# Patient Record
Sex: Male | Born: 1945
Health system: Southern US, Community
[De-identification: ages and names within clinical notes are randomized; demographics above are authoritative.]

## PROBLEM LIST (undated history)

## (undated) DIAGNOSIS — I10 Essential (primary) hypertension: Secondary | ICD-10-CM

## (undated) DIAGNOSIS — F419 Anxiety disorder, unspecified: Secondary | ICD-10-CM

## (undated) DIAGNOSIS — I219 Acute myocardial infarction, unspecified: Secondary | ICD-10-CM

## (undated) DIAGNOSIS — I499 Cardiac arrhythmia, unspecified: Secondary | ICD-10-CM

## (undated) DIAGNOSIS — I2510 Atherosclerotic heart disease of native coronary artery without angina pectoris: Secondary | ICD-10-CM

## (undated) DIAGNOSIS — I484 Atypical atrial flutter: Secondary | ICD-10-CM

## (undated) DIAGNOSIS — F329 Major depressive disorder, single episode, unspecified: Secondary | ICD-10-CM

## (undated) DIAGNOSIS — D693 Immune thrombocytopenic purpura: Secondary | ICD-10-CM

## (undated) DIAGNOSIS — R112 Nausea with vomiting, unspecified: Secondary | ICD-10-CM

## (undated) DIAGNOSIS — E782 Mixed hyperlipidemia: Secondary | ICD-10-CM

## (undated) DIAGNOSIS — F32A Depression, unspecified: Secondary | ICD-10-CM

## (undated) DIAGNOSIS — Z9889 Other specified postprocedural states: Secondary | ICD-10-CM

## (undated) DIAGNOSIS — M1990 Unspecified osteoarthritis, unspecified site: Secondary | ICD-10-CM

## (undated) DIAGNOSIS — I739 Peripheral vascular disease, unspecified: Secondary | ICD-10-CM

## (undated) DIAGNOSIS — T8859XA Other complications of anesthesia, initial encounter: Secondary | ICD-10-CM

## (undated) DIAGNOSIS — K219 Gastro-esophageal reflux disease without esophagitis: Secondary | ICD-10-CM

## (undated) DIAGNOSIS — N289 Disorder of kidney and ureter, unspecified: Secondary | ICD-10-CM

## (undated) DIAGNOSIS — I779 Disorder of arteries and arterioles, unspecified: Secondary | ICD-10-CM

## (undated) DIAGNOSIS — H9190 Unspecified hearing loss, unspecified ear: Secondary | ICD-10-CM

## (undated) DIAGNOSIS — Z86010 Personal history of colonic polyps: Principal | ICD-10-CM

## (undated) DIAGNOSIS — Z87442 Personal history of urinary calculi: Secondary | ICD-10-CM

## (undated) DIAGNOSIS — N189 Chronic kidney disease, unspecified: Secondary | ICD-10-CM

## (undated) DIAGNOSIS — H269 Unspecified cataract: Secondary | ICD-10-CM

## (undated) HISTORY — DX: Mixed hyperlipidemia: E78.2

## (undated) HISTORY — DX: Immune thrombocytopenic purpura: D69.3

## (undated) HISTORY — PX: COLONOSCOPY: SHX174

## (undated) HISTORY — DX: Atherosclerotic heart disease of native coronary artery without angina pectoris: I25.10

## (undated) HISTORY — DX: Atypical atrial flutter: I48.4

## (undated) HISTORY — DX: Essential (primary) hypertension: I10

## (undated) HISTORY — PX: FRACTURE SURGERY: SHX138

## (undated) HISTORY — DX: Unspecified cataract: H26.9

## (undated) HISTORY — DX: Personal history of colonic polyps: Z86.010

## (undated) HISTORY — DX: Disorder of kidney and ureter, unspecified: N28.9

## (undated) HISTORY — PX: HERNIA REPAIR: SHX51

## (undated) HISTORY — PX: EYE SURGERY: SHX253

## (undated) HISTORY — PX: JOINT REPLACEMENT: SHX530

## (undated) HISTORY — PX: TONSILLECTOMY: SUR1361

## (undated) HISTORY — DX: Chronic kidney disease, unspecified: N18.9

---
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
## 2001-05-03 ENCOUNTER — Ambulatory Visit (HOSPITAL_COMMUNITY): Admission: RE | Admit: 2001-05-03 | Discharge: 2001-05-03 | Payer: Self-pay | Admitting: Pulmonary Disease

## 2002-06-28 ENCOUNTER — Ambulatory Visit (HOSPITAL_COMMUNITY): Admission: RE | Admit: 2002-06-28 | Discharge: 2002-06-28 | Payer: Self-pay | Admitting: Pulmonary Disease

## 2002-07-30 ENCOUNTER — Ambulatory Visit (HOSPITAL_BASED_OUTPATIENT_CLINIC_OR_DEPARTMENT_OTHER): Admission: RE | Admit: 2002-07-30 | Discharge: 2002-07-30 | Payer: Self-pay | Admitting: Otolaryngology

## 2002-07-30 ENCOUNTER — Encounter (INDEPENDENT_AMBULATORY_CARE_PROVIDER_SITE_OTHER): Payer: Self-pay | Admitting: *Deleted

## 2004-05-27 ENCOUNTER — Ambulatory Visit (HOSPITAL_COMMUNITY): Admission: RE | Admit: 2004-05-27 | Discharge: 2004-05-27 | Payer: Self-pay | Admitting: Pulmonary Disease

## 2004-06-16 ENCOUNTER — Inpatient Hospital Stay (HOSPITAL_BASED_OUTPATIENT_CLINIC_OR_DEPARTMENT_OTHER): Admission: RE | Admit: 2004-06-16 | Discharge: 2004-06-16 | Payer: Self-pay | Admitting: Cardiology

## 2005-06-04 ENCOUNTER — Emergency Department (HOSPITAL_COMMUNITY): Admission: EM | Admit: 2005-06-04 | Discharge: 2005-06-04 | Payer: Self-pay | Admitting: Emergency Medicine

## 2005-07-09 ENCOUNTER — Ambulatory Visit (HOSPITAL_COMMUNITY): Admission: RE | Admit: 2005-07-09 | Discharge: 2005-07-09 | Payer: Self-pay | Admitting: Pulmonary Disease

## 2006-04-21 ENCOUNTER — Ambulatory Visit (HOSPITAL_COMMUNITY): Payer: Self-pay | Admitting: Psychiatry

## 2006-04-25 ENCOUNTER — Ambulatory Visit (HOSPITAL_COMMUNITY): Payer: Self-pay | Admitting: Psychiatry

## 2006-05-04 ENCOUNTER — Ambulatory Visit (HOSPITAL_COMMUNITY): Payer: Self-pay | Admitting: Psychiatry

## 2006-07-01 ENCOUNTER — Ambulatory Visit: Payer: Self-pay | Admitting: Internal Medicine

## 2006-07-01 ENCOUNTER — Inpatient Hospital Stay (HOSPITAL_COMMUNITY): Admission: EM | Admit: 2006-07-01 | Discharge: 2006-07-06 | Payer: Self-pay | Admitting: Emergency Medicine

## 2006-07-03 ENCOUNTER — Ambulatory Visit: Payer: Self-pay | Admitting: Oncology

## 2006-07-06 ENCOUNTER — Ambulatory Visit: Payer: Self-pay | Admitting: Oncology

## 2006-07-08 ENCOUNTER — Ambulatory Visit: Payer: Self-pay | Admitting: Cardiology

## 2006-07-26 ENCOUNTER — Ambulatory Visit: Payer: Self-pay | Admitting: Cardiology

## 2006-07-28 ENCOUNTER — Encounter (HOSPITAL_COMMUNITY): Admission: RE | Admit: 2006-07-28 | Discharge: 2006-08-27 | Payer: Self-pay | Admitting: Cardiology

## 2006-08-05 LAB — CBC WITH DIFFERENTIAL/PLATELET
BASO%: 0.8 % (ref 0.0–2.0)
Basophils Absolute: 0 10*3/uL (ref 0.0–0.1)
EOS%: 4.2 % (ref 0.0–7.0)
Eosinophils Absolute: 0.2 10*3/uL (ref 0.0–0.5)
HCT: 37.7 % — ABNORMAL LOW (ref 38.7–49.9)
HGB: 12.9 g/dL — ABNORMAL LOW (ref 13.0–17.1)
LYMPH%: 27 % (ref 14.0–48.0)
MCH: 27.8 pg — ABNORMAL LOW (ref 28.0–33.4)
MCHC: 34.1 g/dL (ref 32.0–35.9)
MCV: 81.7 fL (ref 81.6–98.0)
MONO#: 0.4 10*3/uL (ref 0.1–0.9)
MONO%: 10.4 % (ref 0.0–13.0)
NEUT#: 2.4 10*3/uL (ref 1.5–6.5)
NEUT%: 57.6 % (ref 40.0–75.0)
Platelets: 124 10*3/uL — ABNORMAL LOW (ref 145–400)
RBC: 4.61 10*6/uL (ref 4.20–5.71)
RDW: 14.4 % (ref 11.2–14.6)
WBC: 4.2 10*3/uL (ref 4.0–10.0)
lymph#: 1.1 10*3/uL (ref 0.9–3.3)

## 2006-08-05 LAB — MORPHOLOGY
PLT EST: DECREASED
RBC Comments: NORMAL

## 2006-08-05 LAB — CHCC SMEAR

## 2006-08-08 LAB — COMPREHENSIVE METABOLIC PANEL
ALT: 24 U/L (ref 0–40)
AST: 20 U/L (ref 0–37)
Albumin: 3.9 g/dL (ref 3.5–5.2)
Alkaline Phosphatase: 75 U/L (ref 39–117)
BUN: 20 mg/dL (ref 6–23)
CO2: 20 mEq/L (ref 19–32)
Calcium: 8.8 mg/dL (ref 8.4–10.5)
Chloride: 111 mEq/L (ref 96–112)
Creatinine, Ser: 1.67 mg/dL — ABNORMAL HIGH (ref 0.40–1.50)
Glucose, Bld: 90 mg/dL (ref 70–99)
Potassium: 3.8 mEq/L (ref 3.5–5.3)
Sodium: 143 mEq/L (ref 135–145)
Total Bilirubin: 0.4 mg/dL (ref 0.3–1.2)
Total Protein: 6.3 g/dL (ref 6.0–8.3)

## 2006-08-08 LAB — SEDIMENTATION RATE: Sed Rate: 13 mm/hr (ref 0–16)

## 2006-08-08 LAB — ANTI-NUCLEAR AB-TITER (ANA TITER): ANA Titer 1: 1:80 {titer} — ABNORMAL HIGH

## 2006-08-08 LAB — LACTATE DEHYDROGENASE: LDH: 149 U/L (ref 94–250)

## 2006-08-08 LAB — APTT: aPTT: 35 seconds (ref 24–37)

## 2006-08-08 LAB — ANA: Anti Nuclear Antibody(ANA): POSITIVE — AB

## 2006-08-29 ENCOUNTER — Encounter (HOSPITAL_COMMUNITY): Admission: RE | Admit: 2006-08-29 | Discharge: 2006-09-10 | Payer: Self-pay | Admitting: Cardiology

## 2006-09-08 ENCOUNTER — Emergency Department (HOSPITAL_COMMUNITY): Admission: EM | Admit: 2006-09-08 | Discharge: 2006-09-08 | Payer: Self-pay | Admitting: Emergency Medicine

## 2006-09-12 ENCOUNTER — Encounter (HOSPITAL_COMMUNITY): Admission: RE | Admit: 2006-09-12 | Discharge: 2006-10-12 | Payer: Self-pay | Admitting: Cardiology

## 2006-09-15 ENCOUNTER — Encounter: Admission: RE | Admit: 2006-09-15 | Discharge: 2006-09-15 | Payer: Self-pay | Admitting: Orthopaedic Surgery

## 2006-10-14 ENCOUNTER — Encounter (HOSPITAL_COMMUNITY): Admission: RE | Admit: 2006-10-14 | Discharge: 2006-11-13 | Payer: Self-pay | Admitting: Cardiology

## 2006-11-14 ENCOUNTER — Encounter (HOSPITAL_COMMUNITY): Admission: RE | Admit: 2006-11-14 | Discharge: 2006-12-12 | Payer: Self-pay | Admitting: Cardiology

## 2007-02-01 ENCOUNTER — Ambulatory Visit: Payer: Self-pay | Admitting: Oncology

## 2007-03-08 ENCOUNTER — Ambulatory Visit: Payer: Self-pay | Admitting: Cardiology

## 2007-03-10 ENCOUNTER — Ambulatory Visit: Payer: Self-pay | Admitting: Cardiology

## 2007-03-17 ENCOUNTER — Ambulatory Visit: Payer: Self-pay | Admitting: Cardiology

## 2007-03-23 ENCOUNTER — Ambulatory Visit: Payer: Self-pay | Admitting: Cardiology

## 2007-05-10 ENCOUNTER — Ambulatory Visit: Payer: Self-pay

## 2008-01-23 ENCOUNTER — Ambulatory Visit: Payer: Self-pay | Admitting: Cardiology

## 2008-04-01 ENCOUNTER — Ambulatory Visit (HOSPITAL_COMMUNITY): Admission: RE | Admit: 2008-04-01 | Discharge: 2008-04-01 | Payer: Self-pay | Admitting: Pulmonary Disease

## 2008-12-13 HISTORY — PX: CARDIAC CATHETERIZATION: SHX172

## 2009-03-17 ENCOUNTER — Encounter: Payer: Self-pay | Admitting: Cardiology

## 2009-03-25 ENCOUNTER — Ambulatory Visit: Payer: Self-pay | Admitting: Cardiology

## 2009-04-25 ENCOUNTER — Ambulatory Visit: Payer: Self-pay | Admitting: Cardiology

## 2009-04-28 ENCOUNTER — Encounter: Payer: Self-pay | Admitting: Cardiology

## 2009-05-29 ENCOUNTER — Telehealth: Payer: Self-pay | Admitting: Cardiology

## 2009-06-04 ENCOUNTER — Ambulatory Visit: Payer: Self-pay | Admitting: Cardiology

## 2009-06-12 ENCOUNTER — Observation Stay (HOSPITAL_COMMUNITY): Admission: AD | Admit: 2009-06-12 | Discharge: 2009-06-13 | Payer: Self-pay | Admitting: Cardiology

## 2009-06-12 ENCOUNTER — Ambulatory Visit: Payer: Self-pay | Admitting: Cardiovascular Disease

## 2009-06-13 ENCOUNTER — Encounter: Payer: Self-pay | Admitting: Cardiology

## 2009-06-17 ENCOUNTER — Encounter: Payer: Self-pay | Admitting: Cardiology

## 2009-07-11 ENCOUNTER — Ambulatory Visit: Payer: Self-pay | Admitting: Cardiology

## 2009-07-14 ENCOUNTER — Telehealth (INDEPENDENT_AMBULATORY_CARE_PROVIDER_SITE_OTHER): Payer: Self-pay | Admitting: *Deleted

## 2009-07-23 ENCOUNTER — Telehealth: Payer: Self-pay | Admitting: Cardiology

## 2009-09-03 DIAGNOSIS — I2510 Atherosclerotic heart disease of native coronary artery without angina pectoris: Secondary | ICD-10-CM | POA: Insufficient documentation

## 2009-09-03 DIAGNOSIS — R609 Edema, unspecified: Secondary | ICD-10-CM | POA: Insufficient documentation

## 2009-09-03 DIAGNOSIS — E782 Mixed hyperlipidemia: Secondary | ICD-10-CM | POA: Insufficient documentation

## 2009-12-29 ENCOUNTER — Ambulatory Visit: Payer: Self-pay | Admitting: Cardiology

## 2009-12-29 DIAGNOSIS — F341 Dysthymic disorder: Secondary | ICD-10-CM | POA: Insufficient documentation

## 2010-06-03 ENCOUNTER — Telehealth (INDEPENDENT_AMBULATORY_CARE_PROVIDER_SITE_OTHER): Payer: Self-pay | Admitting: *Deleted

## 2010-09-02 ENCOUNTER — Telehealth (INDEPENDENT_AMBULATORY_CARE_PROVIDER_SITE_OTHER): Payer: Self-pay | Admitting: *Deleted

## 2010-10-12 ENCOUNTER — Encounter: Payer: Self-pay | Admitting: Cardiology

## 2010-12-22 ENCOUNTER — Ambulatory Visit
Admission: RE | Admit: 2010-12-22 | Discharge: 2010-12-22 | Payer: Self-pay | Source: Home / Self Care | Attending: Cardiology | Admitting: Cardiology

## 2010-12-22 ENCOUNTER — Encounter: Payer: Self-pay | Admitting: Cardiology

## 2011-01-03 ENCOUNTER — Encounter: Payer: Self-pay | Admitting: Pulmonary Disease

## 2011-01-12 NOTE — Progress Notes (Signed)
Summary: labs  Phone Note Call from Patient Call back at Home Phone [phone]   Caller: Patient Reason for Call: Refill Medication, Talk to Nurse Summary of Call: wants to do lab work now at [facility] center and needs refill on cholr med Initial call taken by: Cher Nakai,  September 02, 2010 2:03 PM  Follow-up for Phone Call        Advised pt to have labs ordered by Dr. Anell Barr so he can manage cholesterol.  Patient verbalized understanding.  Follow-up by: Lovina Reach, LPN,  September 22, 624THL 2:43 PM

## 2011-01-12 NOTE — Progress Notes (Signed)
Summary: REQUEST FOR PRAVASTATIN REFILL  Phone Note Call from Patient Call back at [phone]   Caller: Spouse Call For: nurse Summary of Call: wife left message stating patient need refill on pravastatin sent to [facility]. Nurse informed wife via voicemail that one refill will be sent to pharmacy,but we need lipid lab results which is ordered by Dr. Luan Pulling inorder to refill future orders.   Initial call taken by: Georgina Peer,  June 03, 2010 3:15 PM    Prescriptions: PRAVASTATIN SODIUM 80 MG TABS (PRAVASTATIN SODIUM) Take one tablet by mouth daily at bedtime  #90 x 0   Entered by:   Georgina Peer   Authorized by:   Terald Sleeper, MD, [facility]   Signed by:   Georgina Peer on 06/03/2010   Method used:   Faxed to ...       [facility] [place])       [address] [place]  [address]       Ph: [phone]       Fax: [phone]   RxID:   [id number]

## 2011-01-12 NOTE — Assessment & Plan Note (Signed)
Summary: 6 MO FU PER JAN REMINDER-SRS   Visit Type:  Follow-up Primary Provider:  Dr. Velvet Bathe  CC:  follow-up visit.  History of Present Illness: the patient is a 65 year old male with a history of coronary artery disease but with nonobstructive disease by catheterization. The patient did have an abnormal Cardiolite study the apical lateral defect in May of 2010. However, he declines any substernal chest pain, shortness of breath orthopnea or PND. The patient denies any claudication. He has chronic renal insufficiency which is followed by Dr. Luan Pulling. From a cardiovascular perspective the patient is stable. He does report symptoms of right eye swelling. He denies however any dryness, itching decrease in right eye vision or runny eye.Marland Kitchen He has seen ophthalmologist and no significant pathology was identified.  Clinical Review Panels:  Cardiac Imaging Cardiac Cath Findings  HEMODYNAMICS:  Aortic pressure is 169/75 with a mean of 111, left   ventricular pressure 173/25.      ASSESSMENT:   1. Patent right coronary artery stent.   2. Mild nonobstructive coronary artery disease as outlined above.   3. Elevated left ventricular end-diastolic pressure.      PLAN:  Recommend continued medical therapy.  The patient will be   discharged home later today.      Juanda Bond. Burt Knack, MD (06/13/2009)    Preventive Screening-Counseling & Management  Alcohol-Tobacco     Smoking Status: quit > 6 months     Year Started: 1962     Year Quit: 1975  Current Medications (verified): 1)  Metoprolol Tartrate 25 Mg Tabs (Metoprolol Tartrate) .... Take 1 Tablet By Mouth Twice A Day 2)  Pravastatin Sodium 80 Mg Tabs (Pravastatin Sodium) .... Take One Tablet By Mouth Daily At Bedtime 3)  Nitroglycerin 0.4 Mg Subl (Nitroglycerin) .... Dissolve One Tablet Under Tongue For Severe Chest Pain As Needed Every 5 Minutes, Not To Exceed 3 in 15 Min Time Frame 4)  Calcium 500 Mg Tabs (Calcium Carbonate) .... Take 1  Tablet By Mouth Once A Day 5)  Diazepam 10 Mg Tabs (Diazepam) .... Take 1 Tablet By Mouth Two Times A Day 6)  Prilosec 20 Mg Cpdr (Omeprazole) .... Take 1 Tablet By Mouth Once A Day 7)  Fish Oil Maximum Strength 1200 Mg Caps (Omega-3 Fatty Acids) .... Take 1 Capsule By Mouth Two Times A Day 8)  Budeprion Sr 100 Mg Xr12h-Tab (Bupropion Hcl) .... Take 1 Tablet By Mouth Two Times A Day 9)  Allopurinol 300 Mg Tabs (Allopurinol) .... Take 1 Tablet By Mouth Once A Day 10)  Furosemide 80 Mg Tabs (Furosemide) .... Take 1 Tablet By Mouth Once A Day 11)  Multivitamins  Tabs (Multiple Vitamin) .... Take 1 Tablet By Mouth Once A Day 12)  Aspirin Ec 325 Mg Tbec (Aspirin) .... Take One Tablet By Mouth Daily 13)  Celebrex 200 Mg Caps (Celecoxib) .... Take 1 Tablet By Mouth Every Other Day 14)  Coenzyme Q10 10 Mg Caps (Coenzyme Q10) .... Take 1 Capsule By Mouth Once A Day 15)  Zyrtec Allergy 10 Mg Caps (Cetirizine Hcl) .... Take 1 Tablet By Mouth Once A Day  Allergies: No Known Drug Allergies  Comments:  Nurse/Medical Assistant: The patient's medications were reviewed with the patient and were updated in the Medication List. Pt brought a list of his medications.  Past History:  Past Medical History: Last updated: 09/03/2009 Chronic renal insufficiency Chronic thrombocytopenia History of claudication EDEMA (ICD-782.3) HYPERLIPIDEMIA-MIXED (ICD-272.4) CAD, NATIVE VESSEL (ICD-414.01)    Past  Surgical History: Last updated: 09/03/2009 Hernia repair Cardiac Catheterization Hip Arthroplasty-Total Tonsillectomy  Family History: Last updated: 09/03/2009 Family History of Coronary Artery Disease:  Family History of CVA or Stroke:  CHF  Social History: Last updated: 09/03/2009 Full Time Married  Tobacco Use - Former.  Alcohol Use - no Drug Use - no  Risk Factors: Smoking Status: quit > 6 months (12/29/2009)  Social History: Smoking Status:  quit > 6 months  Review of Systems        The patient complains of depression.  The patient denies fatigue, malaise, fever, weight gain/loss, vision loss, decreased hearing, hoarseness, chest pain, palpitations, shortness of breath, prolonged cough, wheezing, sleep apnea, coughing up blood, abdominal pain, blood in stool, nausea, vomiting, diarrhea, heartburn, incontinence, blood in urine, muscle weakness, joint pain, leg swelling, rash, skin lesions, headache, fainting, dizziness, anxiety, enlarged lymph nodes, easy bruising or bleeding, and environmental allergies.    Vital Signs:  Patient profile:   65 year old male Height:      71 inches Weight:      197 pounds BMI:     27.58 Pulse rate:   52 / minute BP sitting:   126 / 77  (left arm) Cuff size:   regular  Vitals Entered By: Gurney Maxin, RN, BSN (December 29, 2009 9:09 AM)  Nutrition Counseling: Patient's BMI is greater than 25 and therefore counseled on weight management options. CC: follow-up visit Comments Pt c/o cramps to his calves at night. States doesn't happen very often though   Physical Exam  Additional Exam:  General: Well-developed, well-nourished in no distress head: Normocephalic and atraumatic eyes PERRLA/EOMI intact, conjunctiva and lids normal nose: No deformity or lesions mouth normal dentition, normal posterior pharynx neck: Supple, no JVD.  No masses, thyromegaly or abnormal cervical nodes lungs: Normal breath sounds bilaterally without wheezing.  Normal percussion heart: regular rate and rhythm with normal S1 and S2, no S3 or S4.  PMI is normal.  No pathological murmurs abdomen: Normal bowel sounds, abdomen is soft and nontender without masses, organomegaly or hernias noted.  No hepatosplenomegaly musculoskeletal: Back normal, normal gait muscle strength and tone normal pulsus: Pulse is normal in all 4 extremities Extremities: No peripheral pitting edema neurologic: Alert and oriented x 3 skin: Intact without lesions or rashes cervical nodes: No  significant adenopathy psychologic: Normal affect    Impression & Recommendations:  Problem # 1:  CAD, NATIVE VESSEL (ICD-414.01) the patient reports no recurrent angina. He had an abnormal Cardiolite study within a lateral defect but is being treated medically. The patient is currently not on Plavix anymore. A stent to the right coronary artery is patent by catheterization in July of 2010. The patient will need to continue with risk factor modification and therapeutic lifestyle changes. The following medications were removed from the medication list:    Plavix 75 Mg Tabs (Clopidogrel bisulfate) .Marland Kitchen... Take 1 tablet by mouth once a day His updated medication list for this problem includes:    Metoprolol Tartrate 25 Mg Tabs (Metoprolol tartrate) .Marland Kitchen... Take 1 tablet by mouth twice a day    Nitroglycerin 0.4 Mg Subl (Nitroglycerin) .Marland Kitchen... Dissolve one tablet under tongue for severe chest pain as needed every 5 minutes, not to exceed 3 in 15 min time frame    Aspirin Ec 325 Mg Tbec (Aspirin) .Marland Kitchen... Take one tablet by mouth daily  Problem # 2:  HYPERLIPIDEMIA-MIXED (ICD-272.4) the patient is currently taking a statin. His laboratory work is followed by Dr. Luan Pulling.  His updated medication list for this problem includes:    Pravastatin Sodium 80 Mg Tabs (Pravastatin sodium) .Marland Kitchen... Take one tablet by mouth daily at bedtime  Problem # 3:  EDEMA (ICD-782.3) the patient reports unilateral swelling in the right eye. Otherwise he does not appear to be volume overloaded and there is no evidence that is cardiac related. Most likely was an allergic reaction and I recommended to the patient to take Zyrtec 10 mg p.o. q. daily.  Problem # 4:  ANXIETY DEPRESSION (ICD-300.4) the patient appears to be responding well to Wellbutrin and benzodiazepines. His problems followed by his primary care physician.  Patient Instructions: 1)  Zyrtec 10mg  daily - may buy generic over the counter. 2)  Follow up in 1 year.

## 2011-01-12 NOTE — Assessment & Plan Note (Signed)
Summary: RX - SIMVASTATIN  Prescriptions: PRAVASTATIN SODIUM 80 MG TABS (PRAVASTATIN SODIUM) Take one tablet by mouth daily at bedtime  #30 x 1   Entered by:   Lovina Reach, LPN   Authorized by:   Terald Sleeper, MD, [facility]   Signed by:   Lovina Reach, LPN on [id number]   Method used:   Electronically to        [facility]  [address] (retail)       [address]       [place], [place]  [address]       Ph: [phone]       Fax: [phone]   RxID:   [id number]

## 2011-01-14 NOTE — Assessment & Plan Note (Signed)
Summary: 1 YR FUL   Visit Type:  Follow-up Primary Provider:  Dr. Velvet Bathe   History of Present Illness: the patient is a 65 year old male with history of coronary artery disease. He had a stent placed to the right coronary artery. Catheterization in July 2010 showed a patent stent and mild otherwise nonobstructive coronary artery disease. He did have an elevated left ventricular end-diastolic pressure systolic function was within normal limits. The catheterization was done in July following an abnormal Cardiolite study in May of 2010 which showed a small apical lateral defect. The patient has been doing well. He is stable from a cardiovascular perspective. He presented originally with a myocardial infarction in 2007. He watches his diet. He denies any chest pain shortness of breath orthopnea PND he has no palpitations or syncope.  Preventive Screening-Counseling & Management  Alcohol-Tobacco     Smoking Status: quit     Year Quit: 2005  Current Medications (verified): 1)  Metoprolol Tartrate 25 Mg Tabs (Metoprolol Tartrate) .... Take 1 Tablet By Mouth Twice A Day 2)  Pravastatin Sodium 80 Mg Tabs (Pravastatin Sodium) .... Take One Tablet By Mouth Daily At Bedtime 3)  Nitroglycerin 0.4 Mg Subl (Nitroglycerin) .... Dissolve One Tablet Under Tongue For Severe Chest Pain As Needed Every 5 Minutes, Not To Exceed 3 in 15 Min Time Frame 4)  Calcium 500 Mg Tabs (Calcium Carbonate) .... Take 1 Tablet By Mouth Once A Day 5)  Diazepam 10 Mg Tabs (Diazepam) .... Take 1 Tablet By Mouth Two Times A Day 6)  Prilosec 20 Mg Cpdr (Omeprazole) .... Take 1 Tablet By Mouth Once A Day 7)  Fish Oil Maximum Strength 1200 Mg Caps (Omega-3 Fatty Acids) .... Take 1 Capsule By Mouth Two Times A Day 8)  Budeprion Sr 100 Mg Xr12h-Tab (Bupropion Hcl) .... Take 1 Tablet By Mouth Once A Day 9)  Allopurinol 300 Mg Tabs (Allopurinol) .... Take 1 Tablet By Mouth Once A Day 10)  Furosemide 80 Mg Tabs (Furosemide) .... Take  1 Tablet By Mouth Once A Day 11)  Multivitamins  Tabs (Multiple Vitamin) .... Take 1 Tablet By Mouth Once A Day 12)  Aspirin Ec 325 Mg Tbec (Aspirin) .... Take One Tablet By Mouth Daily 13)  Celebrex 200 Mg Caps (Celecoxib) .... Take 1 Tablet By Mouth Every Other Day  Allergies (verified): No Known Drug Allergies  Comments:  Nurse/Medical Assistant: The patient's medication list and allergies were reviewed with the patient and were updated in the Medication and Allergy Lists.  Past History:  Past Surgical History: Last updated: 09/03/2009 Hernia repair Cardiac Catheterization Hip Arthroplasty-Total Tonsillectomy  Family History: Last updated: 09/03/2009 Family History of Coronary Artery Disease:  Family History of CVA or Stroke:  CHF  Social History: Last updated: 09/03/2009 Full Time Married  Tobacco Use - Former.  Alcohol Use - no Drug Use - no  Risk Factors: Smoking Status: quit (12/22/2010)  Past Medical History: Chronic renal insufficiency Chronic thrombocytopenia History of claudication EDEMA (ICD-782.3) HYPERLIPIDEMIA-MIXED (ICD-272.4) CAD, NATIVE VESSEL (ICD-414.01) Stent to the RCA 2007 Cardiac catheterization 2010 patent stent and nonobstructive coronary artery disease following an abnormal Cardiolite study May of 2010 with an apical lateral defect    Social History: Smoking Status:  quit  Review of Systems  The patient denies fatigue, malaise, fever, weight gain/loss, vision loss, decreased hearing, hoarseness, chest pain, palpitations, shortness of breath, prolonged cough, wheezing, sleep apnea, coughing up blood, abdominal pain, blood in stool, nausea, vomiting, diarrhea, heartburn, incontinence, blood  in urine, muscle weakness, joint pain, leg swelling, rash, skin lesions, headache, fainting, dizziness, depression, anxiety, enlarged lymph nodes, easy bruising or bleeding, and environmental allergies.    Vital Signs:  Patient profile:   65 year  old male Height:      71 inches Weight:      195 pounds BMI:     27.30 Pulse rate:   51 / minute BP sitting:   125 / 81  (left arm) Cuff size:   large  Vitals Entered By: Georgina Peer (December 22, 2010 8:41 AM)  Nutrition Counseling: Patient's BMI is greater than 25 and therefore counseled on weight management options.  Physical Exam  Additional Exam:  General: Well-developed, well-nourished in no distress head: Normocephalic and atraumatic eyes PERRLA/EOMI intact, conjunctiva and lids normal nose: No deformity or lesions mouth normal dentition, normal posterior pharynx neck: Supple, no JVD.  No masses, thyromegaly or abnormal cervical nodes lungs: Normal breath sounds bilaterally without wheezing.  Normal percussion heart: regular rate and rhythm with normal S1 and S2, no S3 or S4.  PMI is normal.  No pathological murmurs abdomen: Normal bowel sounds, abdomen is soft and nontender without masses, organomegaly or hernias noted.  No hepatosplenomegaly musculoskeletal: Back normal, normal gait muscle strength and tone normal pulsus: Pulse is normal in all 4 extremities Extremities: No peripheral pitting edema neurologic: Alert and oriented x 3 skin: Intact without lesions or rashes cervical nodes: No significant adenopathy psychologic: Normal affect    EKG  Procedure date:  12/22/2010  Findings:      normal sinus rhythm baseline wander. Heart rate 53 beats per minute nonspecific ST-T wave changes.  Impression & Recommendations:  Problem # 1:  CAD, NATIVE VESSEL (ICD-414.01) the patient status post stent placement to the RCA. He has no recurrent chest pain. He has not required any nitroglycerin. We will continue risk factor modification. His updated medication list for this problem includes:    Metoprolol Tartrate 25 Mg Tabs (Metoprolol tartrate) .Marland Kitchen... Take 1 tablet by mouth twice a day    Nitroglycerin 0.4 Mg Subl (Nitroglycerin) .Marland Kitchen... Dissolve one tablet under tongue  for severe chest pain as needed every 5 minutes, not to exceed 3 in 15 min time frame    Aspirin Ec 325 Mg Tbec (Aspirin) .Marland Kitchen... Take one tablet by mouth daily  Orders: EKG w/ Interpretation (93000)  Problem # 2:  HYPERLIPIDEMIA-MIXED (ICD-272.4) followed by the patient's primary care physician. He is on high-dose statin drug therapy His updated medication list for this problem includes:    Pravastatin Sodium 80 Mg Tabs (Pravastatin sodium) .Marland Kitchen... Take one tablet by mouth daily at bedtime  Problem # 3:  ANXIETY DEPRESSION (ICD-300.4) stable.  Patient Instructions: 1)  Your physician recommends that you continue on your current medications as directed. Please refer to the Current Medication list given to you today. 2)  Follow up in  6 mnths

## 2011-03-04 ENCOUNTER — Emergency Department (HOSPITAL_COMMUNITY): Payer: No Typology Code available for payment source

## 2011-03-04 ENCOUNTER — Emergency Department (HOSPITAL_COMMUNITY)
Admission: EM | Admit: 2011-03-04 | Discharge: 2011-03-04 | Disposition: A | Discharge status: Home / Self Care | Payer: No Typology Code available for payment source | Attending: Emergency Medicine | Admitting: Emergency Medicine

## 2011-03-04 DIAGNOSIS — I2510 Atherosclerotic heart disease of native coronary artery without angina pectoris: Secondary | ICD-10-CM | POA: Insufficient documentation

## 2011-03-04 DIAGNOSIS — Z79899 Other long term (current) drug therapy: Secondary | ICD-10-CM | POA: Insufficient documentation

## 2011-03-04 DIAGNOSIS — IMO0002 Reserved for concepts with insufficient information to code with codable children: Secondary | ICD-10-CM | POA: Insufficient documentation

## 2011-03-04 DIAGNOSIS — Y92410 Unspecified street and highway as the place of occurrence of the external cause: Secondary | ICD-10-CM | POA: Insufficient documentation

## 2011-03-04 DIAGNOSIS — I10 Essential (primary) hypertension: Secondary | ICD-10-CM | POA: Insufficient documentation

## 2011-03-21 LAB — COMPREHENSIVE METABOLIC PANEL
ALT: 31 U/L (ref 0–53)
AST: 27 U/L (ref 0–37)
Albumin: 3.4 g/dL — ABNORMAL LOW (ref 3.5–5.2)
Alkaline Phosphatase: 79 U/L (ref 39–117)
BUN: 22 mg/dL (ref 6–23)
CO2: 24 mEq/L (ref 19–32)
Calcium: 8.6 mg/dL (ref 8.4–10.5)
Chloride: 105 mEq/L (ref 96–112)
Creatinine, Ser: 2.03 mg/dL — ABNORMAL HIGH (ref 0.4–1.5)
GFR calc Af Amer: 40 mL/min — ABNORMAL LOW (ref >60)
GFR calc non Af Amer: 33 mL/min — ABNORMAL LOW (ref >60)
Glucose, Bld: 116 mg/dL — ABNORMAL HIGH (ref 70–99)
Potassium: 3.5 mEq/L (ref 3.5–5.1)
Sodium: 140 mEq/L (ref 135–145)
Total Bilirubin: 0.5 mg/dL (ref 0.3–1.2)
Total Protein: 6.5 g/dL (ref 6.0–8.3)

## 2011-03-21 LAB — CBC
HCT: 38.4 % — ABNORMAL LOW (ref 39.0–52.0)
HCT: 40.3 % (ref 39.0–52.0)
Hemoglobin: 12.7 g/dL — ABNORMAL LOW (ref 13.0–17.0)
Hemoglobin: 13.8 g/dL (ref 13.0–17.0)
MCHC: 33 g/dL (ref 30.0–36.0)
MCHC: 34.3 g/dL (ref 30.0–36.0)
MCV: 84.7 fL (ref 78.0–100.0)
MCV: 84.9 fL (ref 78.0–100.0)
Platelets: 100 10*3/uL — ABNORMAL LOW (ref 150–400)
Platelets: 78 10*3/uL — ABNORMAL LOW (ref 150–400)
RBC: 4.53 MIL/uL (ref 4.22–5.81)
RBC: 4.76 MIL/uL (ref 4.22–5.81)
RDW: 15.6 % — ABNORMAL HIGH (ref 11.5–15.5)
RDW: 15.6 % — ABNORMAL HIGH (ref 11.5–15.5)
WBC: 4.6 10*3/uL (ref 4.0–10.5)
WBC: 5.3 10*3/uL (ref 4.0–10.5)

## 2011-03-21 LAB — BASIC METABOLIC PANEL
BUN: 22 mg/dL (ref 6–23)
CO2: 27 mEq/L (ref 19–32)
Calcium: 8.3 mg/dL — ABNORMAL LOW (ref 8.4–10.5)
Chloride: 108 mEq/L (ref 96–112)
Creatinine, Ser: 1.97 mg/dL — ABNORMAL HIGH (ref 0.4–1.5)
GFR calc Af Amer: 42 mL/min — ABNORMAL LOW (ref >60)
GFR calc non Af Amer: 35 mL/min — ABNORMAL LOW (ref >60)
Glucose, Bld: 111 mg/dL — ABNORMAL HIGH (ref 70–99)
Potassium: 3.6 mEq/L (ref 3.5–5.1)
Sodium: 140 mEq/L (ref 135–145)

## 2011-03-21 LAB — PROTIME-INR
INR: 1.1 (ref 0.00–1.49)
Prothrombin Time: 14.3 seconds (ref 11.6–15.2)

## 2011-03-21 LAB — APTT: aPTT: 34 seconds (ref 24–37)

## 2011-03-21 LAB — TSH: TSH: 0.825 u[IU]/mL (ref 0.350–4.500)

## 2011-04-04 ENCOUNTER — Other Ambulatory Visit: Payer: Self-pay | Admitting: Cardiology

## 2011-04-15 ENCOUNTER — Other Ambulatory Visit (HOSPITAL_COMMUNITY): Payer: Self-pay | Admitting: Pulmonary Disease

## 2011-04-15 DIAGNOSIS — M5410 Radiculopathy, site unspecified: Secondary | ICD-10-CM

## 2011-04-15 DIAGNOSIS — R200 Anesthesia of skin: Secondary | ICD-10-CM

## 2011-04-20 ENCOUNTER — Ambulatory Visit (HOSPITAL_COMMUNITY)
Admission: RE | Admit: 2011-04-20 | Discharge: 2011-04-20 | Disposition: A | Discharge status: Home / Self Care | Payer: No Typology Code available for payment source | Source: Ambulatory Visit | Attending: Pulmonary Disease | Admitting: Pulmonary Disease

## 2011-04-20 DIAGNOSIS — M542 Cervicalgia: Secondary | ICD-10-CM | POA: Insufficient documentation

## 2011-04-20 DIAGNOSIS — M5030 Other cervical disc degeneration, unspecified cervical region: Secondary | ICD-10-CM | POA: Insufficient documentation

## 2011-04-20 DIAGNOSIS — R209 Unspecified disturbances of skin sensation: Secondary | ICD-10-CM | POA: Insufficient documentation

## 2011-04-20 DIAGNOSIS — R200 Anesthesia of skin: Secondary | ICD-10-CM

## 2011-04-20 DIAGNOSIS — M5410 Radiculopathy, site unspecified: Secondary | ICD-10-CM
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
## 2011-04-27 NOTE — Cardiovascular Report (Signed)
NAME:  Derek [name], HIRSH NO.:  [id number]   MEDICAL RECORD NO.:  [id number]          PATIENT TYPE:  INP   LOCATION:  3729                         FACILITY:  [facility]   PHYSICIAN:  Juanda Bond. Burt Knack, MD  DATE OF BIRTH:  Oct 09, 1946   DATE OF PROCEDURE:  06/13/2009  DATE OF DISCHARGE:  06/13/2009                            CARDIAC CATHETERIZATION   PROCEDURES:  Left heart catheterization and selective coronary  angiography.   INDICATIONS:  Mr. Siemion is a 65 year old gentleman with CAD.  He has  had previous stenting of the right coronary artery with a Taxus drug-  eluting stent in 2007.  He saw Dr. Dannielle Burn for accelerating angina and he  had an abnormal Myoview with apical and lateral ischemia.  He was  referred for cardiac cath.   Risks and indications of procedure were reviewed with the patient and an  informed consent was obtained.  The right groin was prepped, draped, and  anesthetized with 1% lidocaine.  Using modified Seldinger technique, a 5-  French sheath was placed in the right femoral artery stent and 5-French  Judkins catheters were used for coronary angiography.  A pigtail  catheter was used to measure LV pressure.  Ventriculogram was deferred  because of renal insufficiency.  The patient has a baseline creatinine  of 2 and it has been in this range for years.  The patient tolerated the  procedure well.  All catheter exchanges were performed over a guide  wire.  Total contrast volume of 30 mL was used for the procedure.   FINDINGS:  Left mainstem widely patent.  No significant stenosis  bifurcates into LAD and left circumflex.   LAD:  The LAD is a large-caliber vessel that courses down and reaches  the LV apex.  There is a large first diagonal branch and a small second  diagonal branch.  There is minimal nonobstructive plaque throughout the  proximal LAD.  There are no significant stenoses present.  There is no  diagonal stenosis present.   Left  circumflex:  Left circumflex is a moderate-sized vessel that  supplies 2 main obtuse marginal branches.  The mid-circ leading into a  small second OM has a 50% stenosis.  There are no high-grade stenoses  throughout.   Right coronary artery:  The right coronary artery shows a widely patent  stent at the junction of the mid to distal vessel.  Proximal to the  stent in the mid RCA, there is a 40% stenosis present.  This is a smooth  area.  There is some mild nonobstructive plaque in the distal vessel.  The vessel bifurcates into a PDA and 2 posterolateral branches.  There  are no high-grade stenoses throughout the course of the right coronary  artery or its branch vessels.   HEMODYNAMICS:  Aortic pressure is 169/75 with a mean of 111, left  ventricular pressure 173/25.   ASSESSMENT:  1. Patent right coronary artery stent.  2. Mild nonobstructive coronary artery disease as outlined above.  3. Elevated left ventricular end-diastolic pressure.   PLAN:  Recommend continued medical therapy.  The  patient will be  discharged home later today.      Juanda Bond. Burt Knack, MD  Electronically Signed     MDC/MEDQ  D:  06/13/2009  T:  06/13/2009  Job:  [id number]

## 2011-04-27 NOTE — Discharge Summary (Signed)
NAME:  Derek [name], Derek [name] NO.:  [id number]   MEDICAL RECORD NO.:  [id number]          PATIENT TYPE:  INP   LOCATION:  3729                         FACILITY:  [facility]   PHYSICIAN:  Juanda Bond. Burt Knack, MD  DATE OF BIRTH:  [birth year]   DATE OF ADMISSION:  06/12/2009  DATE OF DISCHARGE:  06/13/2009                               DISCHARGE SUMMARY   PRIMARY CARDIOLOGIST:  Ernestine Mcmurray, MD, [facility]   PRIMARY CARE Lemya Greenwell:  Jasper Loser. Luan Pulling, MD   DISCHARGE DIAGNOSIS:  Unstable angina.   SECONDARY DIAGNOSES:  1. Coronary artery disease, status post non-ST elevation myocardial      infarction with TAXUS drug-eluting stent placement to the right      coronary artery in July 2007.  2. Chronic asymptomatic bradycardia.  3. Hyperlipidemia.  4. History of low HDL.  5. Chronic renal insufficiency with solitary functioning kidney.  6. Chronic lower extremity edema.  7. Ulcerative lesions in the lower extremity secondary to prediabetes.  8. Chronic thrombocytopenia.  9. History of claudication with normal Dopplers.  10.Thrombocytopenia.   ALLERGIES:  No known drug allergies.   PROCEDURES:  Left heart cardiac catheterization revealing widely patent  right coronary artery stent, but otherwise nonobstructive disease.   HISTORY OF PRESENT ILLNESS:  This 65 year old Caucasian male with a  prior history of CAD, status post non-ST elevation MI and stenting of  the RCA in July 2007.  The patient underwent stress testing as part of a  preoperative evaluation recently revealing an EF of 48% with reversible  high lateral defect as well as an apical defect consistent with  ischemia.  The patient also reported increasing shortness of breath with  prolonged episodes of exertional chest discomfort.  He was subsequently  set up for admission for hydration given prior history of renal  insufficiency with plans for catheterization.   HOSPITAL COURSE:  The patient presented on June 12, 2009, for  hydration  and initiation of Mucomyst therapy.  He has tolerated this well and this  morning underwent left heart cardiac catheterization revealing widely  patent RCA stent, but otherwise nonobstructive disease.  The patient has  had no recurrent symptoms or limitations, but ambulating without  difficulty.  He will be discharged home today in good condition.   DISCHARGE LABORATORY DATA:  Hemoglobin 12.7, hematocrit 38.4, WBC 4.6,  and platelets 78.  Sodium 140, potassium 3.6, chloride 108, CO2 27, BUN  22, creatinine 1.97, glucose 111, total bilirubin 0.5, and alkaline  phosphatase 79.  AST 27, ALT 31, total protein 6.5, albumin 3.4, calcium  8.3, and TSH 0.825.   DISPOSITION:  The patient is being discharged home today in good  condition.   FOLLOWUP PLANS AND APPOINTMENTS:  We have arranged to follow up with Dr.  Dannielle Burn on July 11, 2009, at 2:15 p.m.  He is asked to follow up with Dr.  Luan Pulling as previously scheduled.   DISCHARGE MEDICATIONS:  1. Metoprolol 25 mg b.i.d.  2. Multivitamin daily.  3. Furosemide 80 mg daily.  4. Diazepam 10 mg b.i.d.  5. Pravastatin 40 mg daily.  6. Allopurinol 300 mg daily.  7. Aspirin 325 mg daily.  8. Fish oil 1200 mg daily.  9. Bupropion SR 100 mg daily.  10.Prilosec OTC 20 mg daily.  11.Celebrex 200 mg every other day.  12.Nitroglycerin 0.4 mg sublingual p.r.n. chest pain.   OUTSTANDING LABORATORY STUDIES:  None.   DURATION OF DISCHARGE/ENCOUNTER:  Forty-five minutes including physician  time.      Murray Hodgkins, ANP      Juanda Bond. Burt Knack, MD  Electronically Signed    CB/MEDQ  D:  06/13/2009  T:  06/13/2009  Job:  [id number]   cc:   Percell Miller L. Luan Pulling, M.D.

## 2011-04-27 NOTE — Assessment & Plan Note (Signed)
[facility] OFFICE NOTE   [name], Derek [name]                       MRN:          [id number]  DATE:03/25/2009                            DOB:          [birth year]    PRIMARY CARE PHYSICIAN:  Percell Miller L. Luan Pulling, MD, in [place].   HISTORY OF PRESENT ILLNESS:  The patient is a 65 year old male with a  history of coronary artery disease status post non-STE myocardial  infarction with Taxus stent to the right coronary artery in July 2007.  The patient also has residual 70% circumflex coronary artery disease.  The last stress test was done in 2008 with no evidence of ischemia.  The  patient presents for followup.  He reports small area in the left side  of chest which occasionally gives him a height of this usually in order  of seconds.  He states that this is not exertion related nor this is  associated with shortness of breath.  He is otherwise physically active.  He does complain of some fatigue.  He has no definite claudication,  although he does get some soreness in both lower extremities.  He has  also noted in his right lower extremity poor healing scabs that easily  bleed when he scratches them.  There is a mild amount of itching  associated with this.  The patient states that also he is planning to  undergo hip surgery later this summer.   MEDICATIONS:  1. Metoprolol 25 mg p.o. b.i.d.  2. Diazepam 10 mg p.o. b.i.d.  3. Pravastatin 40 mg p.o. daily.  4. Prilosec over-the-counter.  5. Fish oil.  6. Buspirone SR 10 mg p.o. b.i.d.  7. Allopurinol 300 mg p.o. daily.  8. Furosemide 80 mg p.o. daily.  9. Multivitamin.  10.Aspirin.  11.Celebrex 400 mg p.o. every other day.   PHYSICAL EXAMINATION:  VITAL SIGNS:  Blood pressure 129/78, heart rate  is 56, weight is 191 pounds.  GENERAL:  A 65 year old male in no apparent distress.  HEENT:  Pupils; eyes are clear.  Conjunctivae are clear.  NECK:  Supple.   Normal carotid upstroke, no carotid bruits.  No JVD.  No  thyromegaly.  LUNGS:  Clear breath sounds bilaterally.  HEART:  Regular rate rhythm.  Normal S1 and S2.  No murmurs, rubs, or  gallops.  ABDOMEN:  Soft, nontender.  No rebound or guarding with good bowel  sounds.  EXTREMITY:  Femoral pulses are difficult to palpate.  I can palpate the  left femoral pulse and there is a soft bruit on the right side.  Dorsalis pedis pulses, posterior tibial pulses are faintly palpable in  the lower extremities.  NEURO:  The patient is alert and oriented, grossly nonfocal.   LABORATORY WORK:  The patient had recently had a lipid panel done, which  showed an HDL of 23 and an LDL of 96, triglycerides were 150,  cholesterol was 149.  Liver functions were within normal limits.  Pravastatin was increased to 80 mg p.o. daily.   PROBLEMS:  1. Coronary artery disease with stable symptoms.  a.     Non-ST-elevation myocardial infarction/Taxus stenting to       right coronary artery in July 2007.      b.     Residual 70% circumflex disease, symptomatic.      c.     Normal left ventricular function.      d.     Nonischemic adenosine/Cardiolite study, ejection fraction       60% in April 2009.  2. Chronic asymptomatic sinus bradycardia.  3. Dyslipidemia, controlled on Pravachol, but with low HDL.  4. Chronic renal insufficiency with solitary functioning kidney.  5. Chronic lower extremity edema, resolved.  6. Ulcerative lesions in the lower extremities.  7. Chronic thrombocytopenia.  8. History of claudication but with normal Dopplers in April 2008.   PLAN:  1. The patient does have an abnormal peripheral vascular exam.  I am      also not exactly sure what the poor healing scabs are all about.  I      do not think he has an eruptive xanthomatosis.  His lipid panel is      within normal limits.  We will repeat his lower extremity Dopplers,      however, which augment the wave forms.  2. Given the  fact that the patient is planning to undergo hip surgery      later this summer, I do think it is indicated to proceed with      stress testing.  This can be a chemical stress test with adenosine      or Lexiscan given the difficulty the patient has with walking.  3. The patient can follow up with [place] in the next couple of months,      earlier if further discussion is needed about his test results.     Ernestine Mcmurray, MD,FACC  Electronically Signed    GED/MedQ  DD: 03/25/2009  DT: 03/26/2009  Job #: [id number]   cc:   Percell Miller L. Luan Pulling, M.D.

## 2011-04-27 NOTE — Assessment & Plan Note (Signed)
[facility] OFFICE NOTE   Derek, [name]                       MRN:          [id number]  DATE:01/23/2008                            DOB:          08-May-1946    PRIMARY CARDIOLOGIST:  Dr. Terald [name].   REASON FOR VISIT:  Scheduled clinic follow-up.   Derek [name] continues to do extremely well from Derek cardiovascular  standpoint, with no interim development of signs/symptoms suggestive of  unstable angina pectoris.   When last seen here in Derek clinic in March 2008, Derek [name] was referred for an  adenosine stress Cardiolite study for risk stratification.  This showed  no definite evidence of ischemia; ejection fraction 60% with mild  inferior defect suggestive of probable scar.   [name] is followed regularly by Dr. Erling [name] in [place] for his  chronic renal insufficiency with known, solitary functioning kidney.  Derek [name]  was also previously followed by Dr. Beryle [name], again in [place],  for thrombocytopenia.   With respect to his antiplatelet regimen, Derek [name] has remained on Plavix and  low dose aspirin since last seen in Derek clinic.  Derek [name] is now one and half  years out since undergoing percutaneous intervention with Derek drug-eluting  stent.  Derek [name] does report easy bruisability, which has plagued him for some  time now.   Derek [name] has not smoked tobacco in several years.   Electrocardiogram today reveals sinus bradycardia at 56 bpm with  nonspecific ST abnormalities.   CURRENT MEDICATIONS:  1. Aspirin 81 daily.  2. Plavix.  3. Pravastatin 40 mg daily.  4. Metoprolol 25 b.i.d.  5. Prilosec OTC.  6. Fish oil 1200 mg daily.  7. Allopurinol 300 daily.  8. Furosemide 80 daily.  9. Bupropion 100 mg b.i.d.  10.Diazepam 10 mg b.i.d.   LABORATORY DATA:  Recent lipid profile:  Total cholesterol 154,  triglycerides 237, HDL 33, and LDL 74.   PHYSICAL EXAMINATION:  VITAL SIGNS:  Blood pressure 129/72, pulse 56  regular, weight 196.  GENERAL:  Derek 65 year old [name] sitting upright in no distress.  HEENT:  Normocephalic, atraumatic.  NECK:  Palpable bilateral carotid pulses without bruits; no JVD.  LUNGS:  Clear to auscultation all fields.  HEART:  Regular rate and rhythm (S1, S2), no significant murmurs.  No  rubs.  ABDOMEN:  Soft, nontender with intact bowel sounds.  No bruits.  EXTREMITIES:  1/4 right femoral pulse with soft bruit; no bruit on Derek  left.  Palpable posterior tibialis pulses with  1+ nonpitting edema on Derek right.  Several excoriations of Derek right  lower extremity with some mild erythema.  No obvious oozing.  NEUROLOGICAL:  Flat affect but no focal deficit.   IMPRESSION:  1. Coronary artery disease.      Derek.     NSTEMI/Taxus stenting right coronary artery, July 2007.      b.     Residual 70% CFX disease.      c.     Normal left ventricular function.      d.     Nonischemic adenosine stress Cardiolite; EF  60%, April 2008.  2. Chronic, asymptomatic sinus bradycardia.  3. Mixed dyslipidemia.      Derek.     On Pravachol/fish oil, followed by Dr. Luan [name].  4. Chronic renal insufficiency.      Derek.     Solitary functioning kidney.  5. Chronic lower extremity edema.      Derek.     Secondary to venous insufficiency.  6. Chronic thrombocytopenia.  7. History of claudication.      Derek.     Normal lower extremity arterial Dopplers, April 2008.   PLAN:  1. Discontinue Plavix.  Derek [name] is now 1-1/2 years out from      undergoing drug-eluting stenting.  Moreover, Derek [name] has been plagued by      easy bruisability.  Derek [name] is to remain on low dose aspirin,      indefinitely.  2. Continue aggressive lipid management, per Dr. Luan [name], with target      LDL goal of 70, or less.  3. Schedule return clinic follow-up with myself and Dr. Dannielle [name] in one      year or sooner, if needed.      Derek Serpe, PA-C  Electronically Signed      Derek Mcmurray, MD,FACC  Electronically Signed   GS/MedQ  DD:  01/23/2008  DT: 01/24/2008  Job #: [id number]   cc:   Derek [name] Derek [name], M.D.  Derek [name]. Derek [name], M.D.

## 2011-04-27 NOTE — Assessment & Plan Note (Signed)
Derek [name] OFFICE NOTE   Derek [name], Derek [name]                       MRN:          [id number]  DATE:07/11/2009                            DOB:          Sep 10, 1946    REFERRING PHYSICIAN:  Percell Miller L. Luan Pulling, M.D.   HISTORY OF PRESENT ILLNESS:  The patient is a 65 year old male with a  history of coronary artery status post non-ST-elevation myocardial  infarction in July 2007.  The patient when last seen in the office had  substernal chest pain and the symptoms were worrisome for angina.  He  also had an abnormal stress test with lateral ischemia and therefore we  proceeded with a cardiac catheterization.  The patient did have renal  insufficiency with a creatinine of 1.9 and he was adequately  prehydrated.  The study was done as an inpatient.  The patient was found  to have no significant stenoses with a 40% in-stent restenosis of the  mid RCA and 50% stenosis of the circumflex coronary artery.  The patient  reports no recurrent substernal chest pain.  He has also been taken out  his hot work environment.   REVIEW OF SYSTEMS:  The patient denies any orthopnea, PND, palpitations,  or syncope.   MEDICATIONS:  1. Pravastatin 80 mg p.o. daily.  2. Calcium daily.  3. Metoprolol 25 mg p.o. b.i.d.  4. Diazepam 10 mg p.o. b.i.d.  5. Prilosec 20 mg p.o. daily.  6. Fish oil.  7. Buspirone SR 100 mg p.o. b.i.d.  8. Allopurinol 10 mg p.o. daily.  9. Furosemide 80 mg p.o. daily.  10.Multivitamin.  11.Aspirin.  12.Celebrex.   PHYSICAL EXAMINATION:  VITAL SIGNS:  Blood pressure 132/77, heart rate  61, weights 193 pounds.  GENERAL:  A well-nourished white male in no apparent distress.  HEENT:  Pupils, eyes clear; conjunctivae clear.  NECK:  Supple.  Normal carotid upstroke.  No carotid bruits.  LUNGS:  Clear breath sounds bilaterally.  HEART:  Regular rate and rhythm.  Normal S1 and S2.  No murmurs, rubs,  or  gallops.  ABDOMEN:  Soft.  EXTREMITIES:  No cyanosis, clubbing, or edema.   PROBLEM LIST:  1. Coronary artery disease.      a.     Status post cardiac catheterization with nonobstructive       disease with details as above.      b.     Abnormal adenosine thallium study with apical lateral defect       in May 2010.  2. Chronic asymptomatic sinus bradycardia.  3. Dyslipidemia, on Pravachol.  4. Low HDL.  5. Chronic renal insufficiency with solitary functioning kidney.  6. Chronic lower extremity edema, resolved.  7. Poor circulation in the lower extremities secondary to prediabetes.  8. Chronic thrombocytopenia, stable.  9. History of claudication with normal Doppler.   PLAN:  1. From a cardiovascular standpoint, the patient is stable.  However,      his thallium test did show a lateral ischemia and if he has      recurrent chest pain in the future,  would have a low threshold for      intervention on the circumflex coronary artery if this is the      culprit vessel.  2. The patient's creatinine is stable at 2.1 and can be continued to      be followed by his primary care physician, Dr. Luan Pulling.  3. At this point, the patient is stable from a cardiovascular      standpoint.  I have made no other changes in his medical regimen.     Ernestine Mcmurray, MD,FACC  Electronically Signed    GED/MedQ  DD: 07/11/2009  DT: 07/12/2009  Job #: [id number]   cc:   Percell Miller L. Luan Pulling, M.D.

## 2011-04-27 NOTE — Letter (Signed)
June 04, 2009    [employer] [place], [place]:  KENDER, HANBY  MRN:  [id number]  /  DOB:  [birth year]   Dear Sir/Madam:   This letter addresses the medical problems of my patient, [name].  This 65 year old male has significant coronary artery disease  with prior history of myocardial infarction requiring stent placement.  The patient in addition has significant residual coronary artery disease  and will require a repeat cardiac catheterization.  He has symptoms of  angina particularly exacerbated by exertion or extreme temperatures.  Given the patient's chronic medical condition which includes chronic  renal insufficiency with a solitary functioning kidney, I feel it is  inappropriate for this patient to work under extreme changes in  temperature either being extreme heat or extreme cold.  The patient's  cardiac condition would require that he works at all times in a  temperature-controlled environment.  The patient would, otherwise, be at  high risk for myocardial infarction or worsening angina and  hospitalization.  However, in a controlled climate environment the  patient  should be able to function properly.  He also will undergo a cardiac  catheterization to further address any ongoing problems that he has  developed.   Please consider this recommendation very strongly, and if you have any  further questions please do not hesitate to call me at [phone].    Sincerely,      Ernestine Mcmurray, MD,FACC  Electronically Signed    GED/MedQ  DD: 06/04/2009  DT: 06/04/2009  Job #: [id number]

## 2011-04-30 NOTE — Cardiovascular Report (Signed)
NAME:  Derek [name], Derek [name] NO.:  [id number]   MEDICAL RECORD NO.:  [id number]          PATIENT TYPE:  INP   LOCATION:  2901                         FACILITY:  [facility]   PHYSICIAN:  Eustace Quail, M.D. [facility] DATE OF BIRTH:  [birth year]   DATE OF PROCEDURE:  07/05/2006  DATE OF DISCHARGE:                              CARDIAC CATHETERIZATION   CLINICAL HISTORY:  [name] is 65 years old, has had a previous history  of nonobstructive coronary disease at catheterization in 2005.  He has  hyperlipidemia, hypertension and history of smoking up until 2005.  He was  admitted with unstable angina and underwent catheterization earlier today by  Dr. Johnsie Cancel.  The catheterization showed a 60% lesion in the mid right  coronary artery and 95% lesion in the mid-to-distal right coronary.  We  elected to treat the 95% stenosis.   PROCEDURE:  The procedure was performed by Dr. Gerrit Halls  and myself.  The  patient was given Angiomax bolus and infusion and was given 600 mg of Plavix  and 20 mg of Pepcid.  We used a 6-French JR-4 guiding catheter with side  holes.  We initially tried to pass a Prowater wire across the lesion and had  difficulty crossing, and so we used a PT2 light support wire and we able to  cross the lesion.  We initially had some difficulty seating the JR-4 guiding  catheter but finally were able to accomplish this.  We predilated with a 2.5  x 12 mm Maverick, performing one inflation up to 8 atmospheres for 30  seconds.  We then deployed at 2.75 x 12 mm TAXUS stent, deploying this with  one inflation at 14 atmospheres for 30 seconds.  We then postdilated with a  3.0 x 8-mm Quantum Maverick, performing two inflations up to 15 atmospheres  for 30 seconds.  Final diagnostic study was then performed through the  guiding catheter.  The patient tolerated the procedure well and left the  laboratory in satisfactory condition.   RESULTS:  Initially the stenosis in the  mid-to-distal right coronary artery  was estimated at 95%.  Following stenting, this improved to 0%.   CONCLUSION:  Successful PCI of the lesion in the mid-to-distal right  coronary artery with a TAXUS drug-eluting stent with improvement in  narrowing from 95% to 0%.   DISPOSITION:  The patient was taken to the holding area for further  observation.           ______________________________  Eustace Quail, M.D. [facility]     BB/MEDQ  D:  07/05/2006  T:  07/05/2006  Job:  [id number]   cc:   Percell Miller L. Luan Pulling, M.D.  Fax: [phone]   Ernestine Mcmurray, M.D. [facility]  [address] [place] [address]   Cardiopulmonary Lab

## 2011-04-30 NOTE — Assessment & Plan Note (Signed)
[facility] CARDIOLOGY OFFICE NOTE   NAVY, MOEBIUS                       MRN:          [id number]  DATE:03/08/2007                            DOB:          22-Dec-1945    REFERRING PHYSICIAN:  Percell Miller L. Luan Pulling, M.D.   HISTORY OF PRESENT ILLNESS:  [name] is a 65 year old male with a  history of coronary artery disease status post right Taxus stent in  right coronary artery.  The patient also has renal insufficiency  followed by the nephrologist.  The patient denies any substernal chest  pain, shortness of breath, orthopnea, PND.  His main complaint however  is dizziness, particularly when leaning forward and suddenly standing  erect.  He says that he works at [employer] and he has had difficulty at the  job sometimes bending forward, when he feels presyncopal.  His blood  pressure today in the office is quite low at 84/53.  He denies however  any other cardiovascular related symptoms.  He does report a pain in the  lower extremities which could be claudication upon exertion.   MEDICATIONS:  1. Lisinopril 20 mg p.o. b.i.d.  2. Wellbutrin 100 mg, 2 tablets p.o. daily.  3. Metoprolol 25 mg b.i.d.  4. Diazepam 10 mg p.o. b.i.d.  5. Pravastatin 40 mg a day.  6. Plavix 75 mg a day.  7. Prilosec.  8. Aspirin 325 daily.  9. Allopurinol 100 daily.  10.Fish Oil.   PHYSICAL EXAMINATION:  Blood pressure  84/53 and 99/60 in the right arm.  Heart rate is 52.  Weight 188 pounds.  NECK:  No carotid upstroke.  No carotid bruits.  LUNGS:  Clear. Breath sounds bilaterally.  HEART:  Regular rate and rhythm.  Normal S1, S2.  No murmurs, gallops.  ABDOMEN:  Soft, nontender, no rebound, no guarding.  Good bowel sounds.  EXTREMITIES:  No cyanosis, clubbing or edema.  Dorsalis, pedal pulses  are not palpable bilaterally. Edema in the right leg which is chronic  and not in the left leg.   PROBLEMS:  1. Coronary artery  disease.  2. Status post non ST elevation myocardial infarction, status post      Taxus stent of the right coronary artery.  3. Asymmetrical lower extremity edema secondary to venous      insufficiency.  4. Rule out claudication.      a.     ABIs 0.71, 0.78 in 2005.      b.     Negative ABIs in 2000.      c.     Status post renal artery ultrasound with shrunken right       kidney and renal artery stenosis.  5. Venous insufficiency followed by nephrologist.  6. Thrombocytopenia followed by Dr. Luan Pulling.  7. Normal left ventricular function.   PLAN:  1. From a cardiovascular respect the patient is doing well.  He has      had no substernal chest pain.  2. The patient will be scheduled for adenosine Cardiolite study to      rule out progressive ischemic heart  disease.  3. The patient still complains of claudication.  He has had      conflicting results in regard to his ABIs in the past and will      reschedule ABIs today.  4. The patient is significantly hypotensive which causes clear      dizziness and orthostatic symptoms.  I stopped his Lisinopril      altogether and we will have him come back in a week to check his      blood pressure.     Ernestine Mcmurray, MD,FACC  Electronically Signed    GED/MedQ  DD: 03/08/2007  DT: 03/08/2007  Job #: [id number]   cc:   Percell Miller L. Luan Pulling, M.D.

## 2011-04-30 NOTE — Op Note (Signed)
NAME:  Derek [name], Derek [name]                          ACCOUNT NO.:  [id number]   MEDICAL RECORD NO.:  [id number]                   PATIENT TYPE:  OUT   LOCATION:  RAD                                  FACILITY:  [facility]   PHYSICIAN:  Jefry H. Constance Holster, M.D.                DATE OF BIRTH:  1946/10/04   DATE OF PROCEDURE:  07/30/2002  DATE OF DISCHARGE:  06/28/2002                                 OPERATIVE REPORT   PREOPERATIVE DIAGNOSIS:  Bilateral tonsil cystic masses.   POSTOPERATIVE DIAGNOSIS:  Bilateral tonsil cystic masses.   PROCEDURE:  Tonsillectomy.   SURGEON:  Jefry H. Constance Holster, M.D.   ANESTHESIA:  General endotracheal anesthesia.   COMPLICATIONS:  None.   ESTIMATED BLOOD LOSS:  10 cc.   FINDINGS:  Bilateral tonsil enlargement with a very large cystic mass  incorporated into the lower two-thirds of the right tonsil with purulent  material within the cyst.  Smaller such cysts on the left side, also with  purulent material.  The right side was about double the size of the left.  The tonsils were sent for pathologic evaluation labeled separately, right  tonsil and left tonsil.   DISPOSITION:  The patient tolerated the procedure well, was awakened,  extubated, and transferred to recovery in stable condition.   INDICATIONS:  This is a 65 year old gentleman who had a 1-1/2-year history  of a slowly enlarging mass in the right tonsil that is causing him some  difficulty with swallowing and displacement of the tonsil toward the  midline.  The risks, benefits, alternatives, and complications of the  procedure were explained to the patient, who seemed to understand and agreed  to surgery.   DESCRIPTION OF PROCEDURE:  The patient was taken to the operating room and  placed on the operating table in the supine position.  Following induction  of general endotracheal anesthesia, the table was turned 90 degrees and the  patient was draped in the standard fashion.  A Crowe-Davis mouth gag was  inserted into the oral cavity, used to retract the tongue and mandible, and  attached to the Mayo stand.  A red rubber catheter was inserted through the  right side of the nose, withdrawn through the mouth, and used to retract the  soft palate and uvula.  Electrocautery dissection was used to dissect the  tonsils, keeping the capsule intact.  There was minimal bleeding encountered  during the dissection.  Spot cautery was used as needed for hemostasis.  The  tonsils were sent separately for pathologic evaluation.  The pharynx was  suctioned of blood and secretions, irrigated with saline solution, and an  orogastric tube was used to aspirate the contents of the stomach.  The mouth  gag was released and the pharynx was reinspected.  There was no bleeding.  The patient was then awakened, extubated, and transferred to recovery in  stable condition.  Jefry H. Constance Holster, M.D.    JHR/MEDQ  D:  07/30/2002  T:  07/31/2002  Job:  [id number]   cc:   Alonza Bogus, M.D.

## 2011-04-30 NOTE — Assessment & Plan Note (Signed)
[place]                            [facility] OFFICE NOTE   Derek [name], Derek [name]                       MRN:          [id number]  DATE:07/26/2006                            DOB:          02-21-1946    HISTORY OF PRESENT ILLNESS:  The patient is a pleasant 65 year old male with  a history of coronary artery disease.  The patient was seen by me for the  first time in 2005, and we referred him for a cardiac catheterization.  At  that time, he was found to have single-vessel coronary artery disease which  was treated medically, as well as peripheral vascular disease, and a  referral to Dr. Albertine Patricia was generated at that time.  He felt that the patient  could be treated medically for his peripheral vascular disease, including  his high-grade stenosis of the right renal artery.   The patient was recently admitted on July 01, 2006, to [facility]  after he presented with a non-ST elevated myocardial infarction and  underwent percutaneous coronary intervention to the mid to distal right  coronary artery by Dr. Olevia Perches.  The patient was also seen by  hematology/oncology in consultation for thrombocytopenia.  The patient was  treated with bicarbonate in the setting of chronic renal insufficiency with  a creatinine of 1.9.  A followup creatinine done a week later did show a  stabilization of 1.9.  Previously, the patient has been intolerant to  Lipitor and is now on Pravachol.   The patient presents for followup.  He has been doing well.  He reports no  substernal chest pain.  There is no orthopnea or PND.  He does state that he  had swelling in the right lower extremity which has been going on for more  than a month, and actually _____________his cardiac catheterization.  He has  no prior history of DVT.  The patient continues to report significant  claudication of both lower extremities, but the in the right worse than in  the left.   PHYSICAL EXAMINATION:  VITAL SIGNS:  Blood pressure 118/70, heart rate 60  beats per minute, weight is 89 pounds.  NECK:  Normal carotid upstroke, no carotid bruits.  LUNGS:  Clear bilaterally.  HEART:  Regular rate and rhythm, normal S1 and S2, no murmurs, rubs, or  gallops.  ABDOMEN:  Soft, nontender, no rebound or guarding.  Good bowel sounds.  EXTREMITIES:  Significant swelling with 3+ peripheral edema in the right  leg.  Dorsalis pedis and posterior tibial in the right leg are not palpable.  The patient also has a faint right femoral pulse with a bruit.  The left  femoral pulse is palpable.  The dorsalis pedis and posterior tibial pulse is  1+ on the left side.   PROBLEM LIST:  1. Asymmetrical lower extremity edema, rule out deep vein thrombosis.  2. Claudication.      a.     History of peripheral vascular disease (evaluated in 2005, ABI's       0.71 and 0.78, respectively, right and left).  b.     Status post right renal artery ultrasound with shrunken kidney       and renal artery stenosis.  3. Renal insufficiency, creatinine 1.9, stable post-catheterization.  4. Thrombocytopenia, stable.  Platelets of 114.  5. Recent non-ST elevation myocardial infarction, status post Taxus stent      to the right coronary artery.  6. Normal left ventricular systolic function.   PLAN:  1. The patient's creatinine is stable after his recent catheterization.      He has a baseline creatinine of 1.9.  This will need continued followup      in the future, and we will be rechecking this in six months.  2. The patient's platelet count is also stable on Plavix therapy with a      platelet count of 114, and he can continue on the combination therapy.      We will monitor his platelet count in one month.  3. The patient has no recurrence of substernal chest pain and is stable      from a cardiovascular perspective.  4. We will send the patient for lower extremity Doppler's for the right       lower extremity due to significant edema.  I have concerns about a deep      vein thrombosis, and this will be done today.  5. The patient also will need followup ankle brachial indices on both legs      as he continues to have claudication with minimal exertion.                                   Ernestine Mcmurray, MD, [facility]   GED/MedQ  DD:  07/26/2006  DT:  07/26/2006  Job #:  [id number]   cc:   Percell Miller L. Luan Pulling, MD

## 2011-04-30 NOTE — Discharge Summary (Signed)
NAME:  Derek [name], Derek [name] NO.:  [id number]   MEDICAL RECORD NO.:  [id number]          PATIENT TYPE:  INP   LOCATION:  2901                         FACILITY:  [facility]   PHYSICIAN:  Rosanne Sack, NP    DATE OF BIRTH:  [birth year]   DATE OF ADMISSION:  07/01/2006  DATE OF DISCHARGE:                                 DISCHARGE SUMMARY   DISCHARGE DIAGNOSES:  1.  Chest pain/coronary artery disease status post myocardial infarction      this admission (STEMI), status post cardiac catheterization/percutaneous      coronary intervention with a Taxus stent to the lesion in the mid to      distal right coronary artery done on July 05, 2006 by Dr. Rockne Menghini.  2.  Thrombocytopenia stable at this time.  Hemo/Oncology following.  The      patient with a negative HEP panel.  3.  Chronic renal insufficiency with solitary kidney, creatinine stable at      discharge at 1.9.  4.  Hyperlipidemia with previous intolerance to Lipitor.   PAST MEDICAL HISTORY:  1.  Hypertension.  2.  Hyperlipidemia.  3.  Cardiac catheterization in 2005 that showed 30% LAD, 70% circumflex and      70% right coronary artery, EF of 60%.  4.  Peripheral vascular disease evaluated by Dr. Albertine Patricia in 2005 with ABI      normal at rest but decreased to 0.71 on the right and 0.78 on the left      with exertion.      1.  Status post renal artery ultrasound that showed a profoundly          shrunken right kidney.  5.  Nonviable right kidney with history of renal insufficiency.  6.  Depression.  7.  Intolerance to Lipitor.  8.  Remote history of tobacco use, quit in 2005.   CONSULTATIONS THIS ADMISSION:  Dr. Beryle Beams on July 02, 2006 in regards  to thrombocytopenia of unclear duration, possibly medications contributing  factor.  Negative HEP panel.  Followup outpatient.  Consider treatment for  Fludan.   HOSPITAL COURSE:  [name] is a pleasant 65 year old Caucasian gentleman  with known history of  nonobstructive coronary artery disease by  catheterization in 2005 who presents to [facility] with complaints of  epigastric pain.      Rosanne Sack, NP     MB/MEDQ  D:  07/06/2006  T:  07/06/2006  Job:  [id number]

## 2011-04-30 NOTE — H&P (Signed)
NAME:  Derek [name], Derek [name] NO.:  [id number]   MEDICAL RECORD NO.:  [id number]          PATIENT TYPE:  EMS   LOCATION:  MAJO                         FACILITY:  [facility]   PHYSICIAN:  Glori Bickers, M.D. LHCDATE OF BIRTH:  1946-05-26   DATE OF ADMISSION:  07/01/2006  DATE OF DISCHARGE:                                HISTORY & PHYSICAL   PRIMARY CARE PHYSICIAN:  Dr. Luan Pulling.   PRIMARY CARDIOLOGIST:  Dr. Terald Sleeper (less saw in 2005)   CHIEF COMPLAINT:  Chest pain.   HISTORY OF PRESENT ILLNESS:  Derek [name] is a 65 year old white male with a  history of nonobstructive coronary artery disease by cath in 2005.  Two days  ago he had sudden onset of mid epigastric pain while he was in the shower  that lasted approximately 15 minutes.  He had similar symptoms yesterday  that did not last quite as long.  Today, he was wakened in by these symptoms  at 3:00 a.m.  It reached an 8/10 which was more intense than it had  previously been.  He states he was unable to get comfortable, although  position changes did not alter the pain.  He had some associated shortness  of breath but no nausea, vomiting or diaphoresis.  He took four Tums and a  Prilosec but his symptoms were not changed.  He went to see Dr. Luan Pulling this  morning after he called for an appointment and Dr. Luan Pulling evaluated his EKG  and sent him by EMS to [facility]. [facility].  He states that the  pain radiated to both arms.  He states that in the emergency room he was  given IV nitroglycerin and morphine.  This combination of medications  dropped his systolic blood pressure into the 70s and he was fluid  resuscitated successfully with a now normal blood pressure.  His pain after  the medications is currently a 1/10.  In general, he has been in his usual  state of health and been active with his job and around the house without  any symptoms until 2 days ago.   PAST MEDICAL HISTORY:  1.  History of  hypertension.  2.  Hyperlipidemia.   He has remote history of tobacco use and family history of premature  coronary artery disease but no history of diabetes, alcohol or drug abuse.  He had a cardiac catheterization in 2005 that showed a 30% LAD, a 70%  circumflex and a 70% RCA.  His EF was 60%.  A subsequent outpatient Myoview  showed no ischemia.  He also has peripheral vascular disease and was  evaluated by Dr. Albertine Patricia in 2005.  Dr. Albertine Patricia noted that his ABIs were normal  at rest but decreased to 0.71 on the right and 0.78 on the left with  exertion.  A renal artery ultrasound at that time showed a profoundly  shrunken right kidney at 6.5 x 3.1 cm.  Dr. Albertine Patricia felt that the right renal  artery had severe stenosis based on velocities significantly elevated at 589  cm/sec but there was no uptake of  contrast on catheterization films and he  did not he did not think the kidney was viable.  He also has a history of  renal insufficiency.  Dr. Luan Pulling follows him closely for this, but I am  unable to obtain those records and his last documented creatinine was 1.6 in  2005.  He has a history of depression as well.   SURGICAL HISTORY:  1.  Status post cardiac catheterization.  2.  Tonsillectomy.  3.  Hernia repair.  4.  Left hip fracture and repair in 1962.   ALLERGIES:  He had an intolerance to LIPITOR with aches and muscle fatigue  but has never tried another lipid lowering medication.   MEDICATIONS:  1.  Tarka 40/240 daily.  2.  Prilosec 20 mg a day.  3.  Aspirin 81 mg a day.  4.  Wellbutrin 100 mg b.i.d.  5.  Diazepam 10 mg b.i.d.  6.  Furosemide 40 mg daily for the past week.   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  He has a greater than 75 pack-year history of tobacco use  but quit in 2005.  He does not abuse alcohol or drugs.   FAMILY HISTORY:  His mother died at age 74 with heart failure, but had never  had a cardiac  catheterization or any documented heart disease.  His father  died at age 30 after a stroke.  He has one brother who had an MI and bypass  surgery at age 75.   REVIEW OF SYSTEMS:  The chest pain as described above.  He has some chronic  dyspnea on exertion.  He has right greater than left lower extremity edema  for the last month.  He has claudication symptoms and gets pain up the backs  of his legs when he walks up hills.  He used to cough and has some wheezing  but this has improved significantly since he quit smoking.  He has possible  sleep apnea according to his family but does not snore heavily.  His  depression is fairly well controlled by the Wellbutrin.  He has chronic  arthralgias and joint pains, especially in his neck, shoulders and hips.  He  has no reflux symptoms on the Prilosec and denies any history of  hematemesis, hemoptysis or melena.  Review of systems is otherwise negative.   PHYSICAL EXAMINATION:  VITAL SIGNS:  His blood pressure is 127/73, pulse 59,  respiratory rate 11, O2 saturation 97% on 2 liters.  GENERAL:  He is a well-developed elderly white male in no acute distress.  HEENT:  Head is normocephalic and atraumatic.  Pupils equal, round and  reactive to light and accommodation.  Extraocular movements intact.  Sclera  clear.  Nares without discharge.  NECK:  There is no lymphadenopathy, thyromegaly, bruit or JVD noted.  CARDIOVASCULAR:  Heart is regular rate and rhythm with an S1and S2 and a  very soft systolic ejection murmur is noted at the left upper sternal  border.  His distal pulses are 2+ in bilateral radials.  The lower extremity  DP pulses are decreased but palpable.  His left femoral pulse is also  decreased but palpable and no femoral bruits are appreciated bilaterally.  LUNGS:  He has a few rales in the bases, but otherwise clear.  SKIN:  No rashes or lesions are noted.  ABDOMEN:  Soft and nontender with active bowel sounds.  There is no  splenomegaly noted. EXTREMITIES:  He has 2+ edema bilaterally, right greater than left, with no  cyanosis, clubbing or edema or rashes noted.  MUSCULOSKELETAL:  There is no joint deformity or effusions and no spine or  CVA tenderness.  NEUROLOGIC:  He is alert and oriented.  Cranial nerves II-XII grossly  intact.   Chest x-ray is pending.   EKG is sinus bradycardia, rate 46 with minimal inferior ST elevation  compared to EKG dated 2003.   LABORATORY VALUES:  Hemoglobin 15.4 Medical 45.7, WBC 6.4, platelets 109.  Sodium 137 potassium 4.4, chloride 106, BUN 26, creatinine 2.3, glucose 108  (BUN/creatinine 14/1.5 in 2003).   IMPRESSION:  Derek [name] symptoms are consistent with acute coronary  syndrome with mild ST-segment elevation inferiorly.  He is currently pain  free.  He will likely need a cath, though his BUN and creatinine are  elevated in the face of a solitary kidney.  He will be admitted to step-  down.  He will be treated with heparin as well as aspirin and beta blocker.  Low dose morphine will be tried along with low dose IV nitroglycerin if his  pain returns.  He is tentatively on for cardiac catheterization on Monday,  but he will be hydrated and his BUN and creatinine will be followed closely.  We will cycle enzymes, but no 2b3a will be given due to his increased BUN  and creatinine.  Additionally we will stop the [place] which is a combination  ACE inhibitor and verapamil and substitute a beta blocker and Norvasc for  blood pressure control.  He will be continued on his home medications with  the exception that we will hold the furosemide.  Because of the aspirin be  increased to 325 mg daily, we will increase his Prilosec to b.i.d.  He will  be followed closely and taken urgently to the cath lab if his condition  deteriorates.      Rosaria Ferries, P.A. [facility]      Glori Bickers, M.D. [facility]  Electronically Signed    RB/MEDQ  D:  07/01/2006  T:  07/01/2006   Job:  [id number]

## 2011-04-30 NOTE — Consult Note (Signed)
NAME:  [name] [name] NO.:  [id number]   MEDICAL RECORD NO.:  [id number]          PATIENT TYPE:  INP   LOCATION:  2901                         FACILITY:  [facility]   PHYSICIAN:  Awilda Metro, PA.    DATE OF BIRTH:  October 21, 1946   DATE OF CONSULTATION:  07/02/2006  DATE OF DISCHARGE:                                   CONSULTATION   REASON FOR CONSULTATION:  Thrombocytopenia.   HISTORY:  Mr. [name] [name] is a 65 year old white male with a history of non-  obstructive coronary artery disease by catheterization in 2005.  Two days  prior to admission, he had the sudden onset of mid epigastric pain while  showering that lasted approximately 15 minutes.  One day prior to admission,  he had similar symptoms of a shorter duration. On the day of admission, he  was awakened at 3 a.m. with similar pain but had a much higher intensity  described as an 8 out of 10 on a 0 to 10 scale.  No measures that he tried  yielded any relief from the pain or his ability to get comfortable which  included position change, Prilosec, and Tums.  He was evaluated by Dr.  Luan [name] on the morning of admission and was sent to [facility]  Emergency Room for further evaluation and management.  He was admitted to  rule out MI and has been on cardiac protocol which includes the use of  heparin.   REVIEW OF SYSTEMS:  He has had chest pain as described above, but no further  chest pain since admission.  He has had no double vision, blurred vision,  headache, or syncopal episodes.  He denies nausea, vomiting, abdominal pain,  diarrhea, constipation, melena, or hematochezia.  He has had no dysuria,  hematuria, urgency, or increased frequency.   PAST MEDICAL HISTORY:  Significant for history of hypertension,  hyperlipidemia, remote history of tobacco use, denies history of alcohol or  drug abuse.  His right kidney is profoundly shrunken as per renal artery  ultrasound.  This was evaluated by [name] [name]  in 2005.  It was felt at that  time that the right renal artery had severe stenosis and did not feel that  this kidney was viable.  He also has a history of some renal insufficiency  and is followed by [name] [name] for this, as well.  He has a history of  depression.   PAST SURGICAL HISTORY:  Status post tonsillectomy,  hernia repair, status  post cardiac catheterization in 2005, status post left hip fracture and  repair in 1952.   ALLERGIES:  He is intolerant to LIPITOR with myalgias and fatigue, but  reports no other medication allergies.   MEDICATIONS:  Tarca 40/240 daily, Prilosec 20 mg daily, aspirin 81 mg daily,  Wellbutrin 100 mg p.o. b.i.d., Diazepam 10 mg b.i.d., Furosemide 40 mg daily  for the past week.   SOCIAL HISTORY:  He is married and lives in [place] with his wife.  He  works as a Games developer.  He has a 75-pack-year plus  history of tobacco use  but quit in 2005.  Again, he denies alcohol or drug use.   FAMILY HISTORY:  Mother died at the age of 60 with heart failure, father  died at the age of 42 after a stroke.  He has one brother who had an MI and  bypass surgery at the age of 99.   PHYSICAL EXAMINATION:  VITAL SIGNS:  Blood pressure 112/64, pulse 70, respirations 20, O2 sat 95-  97% on 2 liters via nasal cannula.  GENERAL:  Pleasant 65 year old white male who is awake, alert, and in no  acute distress.  HEENT:  Normocephalic, atraumatic, oropharynx clear.  NECK:  Supple without lymphadenopathy.  CHEST:  Clear to auscultation without wheezes, rales, rhonchi, or dullness  to percussion.  CARDIOVASCULAR EXAM:  Regular rate and rhythm with a normal S1 and S2.  There is a soft [id number] systolic ejection murmur at the left upper sternal  border.  ABDOMEN:  Soft, obese, nontender, nondistended, bowel sounds are present and  normal active in all quadrants.  EXTREMITIES:  2+ pitting edema bilaterally.  MUSCULOSKELETAL:  Without effusions or point tenderness.  No  joint  deformities noted.  NEUROLOGICAL:  The patient is alert and oriented x3. Cranial nerves 2-12  grossly intact.   LABORATORY DATA:  On admission, CBC revealed white count 6.4, hemoglobin  16.3, hematocrit 48, with platelets of 109.  Earlier this morning, an  automated CBC revealed a white count 5, hemoglobin 13.3, hematocrit 40, and  platelets of 93.  Dr. Wilhemina [name] ordered a repeat CBC with a manual count and  the results were a white count 5.5, hemoglobin 14.3, hematocrit 43.3, with  platelets 105.  Chemistries revealed sodium 141, potassium 4.1, chloride  104, CO2 29, BUN 24, creatinine 2.2, glucose 109, total bilirubin 0.8, total  protein 5.5, albumin 3, calcium 9, amylase 113, lipase 38.  Cardiac markers  with creatinine kinase 366, CK MB 32.1, relative index 8.8, troponin 9.21.  Chest x-ray was obtained and revealed mild left base atelectasis which was  new from a previous exam.   ASSESSMENT/PLAN:  This is a pleasant 65 year old white male with history of  cardiac disease, specifically coronary artery disease status post cardiac  catheterization in 2005.  He currently has thrombocytopenia of unclear  duration.  Certainly, some of his medications may be contributing to the  thrombocytopenia, specifically the Tarca and also the heparin, however,  based on manual counts, he is essentially stable from admission.  I would  advise obtaining an HIV panel and DIC panel and awaiting the results of  those tests prior to any invasive procedures.  His renal status is also  somewhat concerning.  If the platelet counts continue to decrease, may want  to consider treatment with Refludan with pharmacy to dose.  I would,  however, recommend this be used with caution in this gentleman with one  kidney and renal insufficiency.   Thank you for allowing [place] to participate in his care.      Awilda Metro, PA.    AJ/MEDQ  D:  07/02/2006  T:  07/02/2006  Job:  [id number]

## 2011-04-30 NOTE — Cardiovascular Report (Signed)
NAME:  REINIER, LIENHART NO.:  [id number]   MEDICAL RECORD NO.:  [id number]                   PATIENT TYPE:  OIB   LOCATION:  6501                                 FACILITY:  [facility]   PHYSICIAN:  Jenkins Rouge, M.D.                  DATE OF BIRTH:  11/04/1946   DATE OF PROCEDURE:  06/16/2004  DATE OF DISCHARGE:  06/16/2004                              CARDIAC CATHETERIZATION   ADDENDUM:  Mr. Lehr films were reviewed with Dr. Vicenta Aly in depth.  He agreed that he had significant distal aortic disease, which probably  needs further workup.  The right kidney is lightly to be asymmetrically  small.  In regards to his coronaries Dr. Vicenta Aly would have graded the  right coronary artery as a 70-75% lesion.  He agreed that if his symptoms  are not typical of angina  that a follow up Cardiolite study would be in  order.  If the Cardiolite study is positive for ischemia he can be referred  for angioplasty; technically the right coronary artery is doable in regards  to angioplasty or stenting.   In my talking to the patient his symptoms are not typical of angina as he  almost constant left shoulder pain and his primary symptoms are fatigue.   The patient will follow up in [place] for an outpatient Cardiolite study.  If  this is positive for inferior wall ischemia he will be referred back for  angioplasty of the right coronary artery.  He will subsequently have his  creatinine followed in further workup for right renal artery stenosis and  distal aortic disease.                                               Jenkins Rouge, M.D.    PN/MEDQ  D:  06/16/2004  T:  06/17/2004  Job:  [id number]   cc:   [facility]   Ernestine Mcmurray, M.D. [facility]   Satira Sark, M.D. [facility]   Dr. Luan Pulling

## 2011-04-30 NOTE — Cardiovascular Report (Signed)
NAME:  Derek [name], Derek [name] NO.:  [id number]   MEDICAL RECORD NO.:  [id number]          PATIENT TYPE:  INP   LOCATION:  2901                         FACILITY:  [facility]   PHYSICIAN:  Jenkins Rouge, M.D.     DATE OF BIRTH:  [birth year]   DATE OF PROCEDURE:  DATE OF DISCHARGE:                              CARDIAC CATHETERIZATION   CORONARY ARTERIOGRAPHY .   INDICATIONS:  Chest pain with subendocardial MI.   The patient had a creatinine 2 prior to the case.  Bicarb drip was started.   We did diagnostic cath with approximately 60-70 mL of dye.  Left main  coronary was normal.   Left anterior descending artery was normal.   Circumflex coronary artery had 60-70% AV groove branch disease.   Right coronary was dominant.  There was a 50% tubular lesion in the mid  vessel and there was a 90% eccentric distal lesion which was the culprit  lesion.   PDA and PLA were normal.   The patient had normal LV function by echo.   Across the valve for hemodynamics, aortic pressure was 156/77, LV pressure  was 160/21.   Films reviewed with Dr. Olevia Perches.  We used very little dye for the diagnostic  even though his creatinine is 2.  Dr. Olevia Perches felt that since the patient is  not a diabetic, we would proceed with angioplasty of the right coronary  artery covering the distal vessel with probably 1 stent.           ______________________________  Jenkins Rouge, M.D.     PN/MEDQ  D:  07/05/2006  T:  07/05/2006  Job:  [id number]

## 2011-04-30 NOTE — Cardiovascular Report (Signed)
NAME:  Derek [name], Derek [name] NO.:  [id number]   MEDICAL RECORD NO.:  [id number]                   PATIENT TYPE:  OIB   LOCATION:  6501                                 FACILITY:  [facility]   PHYSICIAN:  Jenkins Rouge, M.D.                  DATE OF BIRTH:  1946/04/04   DATE OF PROCEDURE:  06/16/2004  DATE OF DISCHARGE:  06/16/2004                              CARDIAC CATHETERIZATION   PROCEDURE PERFORMED:  Coronary arteriography.   CARDIOLOGIST:  Jenkins Rouge, M.D.   INDICATIONS:  Left shoulder pain, fatigue and multiple coronary risk  factors, also bilateral buttock pain, question claudication, and distal  aortic disease.   DESCRIPTION OF PROCEDURE:  Standard catheterization was done from the right  femoral artery.   RESULTS:  Left Main Coronary Artery:  The left main coronary artery had 20%  discrete stenosis.   Left Anterior Descending Artery:  Left anterior descending artery had 20-30%  multiple discrete lesions in the proximal and midportion.  The distal vessel  was normal.  First and second diagonal branches were normal.   Circumflex Coronary Artery:  The circumflex coronary artery has a 30%  discrete lesion in the midvessel.  The distal AV groove branch had a 60%  eccentric lesion.   Right Coronary Artery: The right coronary artery was dominant.  There were  30% multiple discrete lesions proximally and there was a discrete 70% lesion  in the midvessel.  The distal vessel has 30-40% multiple discrete lesions.   VENTRICULOGRAPHIC DATA:  RAO Ventriculography:  RAO ventriculography was  normal.  EF was 60%.  There was no MR.  There was no gradient across the  aortic valve.  Aortic pressure was 120/9.  LV pressure was 123/66.   AORTOGRAPHIC DATA:  Distal aortography was performed due to the patient's  system of claudication. He also has hypertension and a creatinine of 1.5.  There appeared to be moderate distal aortic disease with a small aneurysm.  Follow up ABIs would be indicated.  The internal iliacs appeared patent  bilaterally.  Of note, the patient had a large left renal artery.  It was  difficult to identify a right renal artery.  There appeared to be very slow  flow to one portion of the right kidney, which would indicate possible  significant renal artery stenosis.   IMPRESSION:  I will review the films with Dr. Vicenta Aly.   I suspect the patient may benefit from PTCA of the mid right coronary  artery; however, his primary symptoms are fatigue and musculoskeletal type  shoulder pain.  He does not have nitroglycerin and needs this.  The left  shoulder pain is almost constant and sounds more orthopedic.  I do not think  the fatigue is associated with heart problems.  He has not had a stress test  in over two years.  I think he also should follow up with a  renal duplex to  assess asymmetric  kidney sizes and possible need for intervention to the right renal artery  given his creatinine of 1.5-1.6 and hypertension.  Follow up ABIs would be  indicated although I do not think that the internal iliacs her significant  disease.                                               Jenkins Rouge, M.D.    PN/MEDQ  D:  06/16/2004  T:  06/17/2004  Job:  [id number]   cc:   c/o [name], M.D. [facility]   Ernestine Mcmurray, M.D. [facility]   Dr. Gwenyth Bender, [place]

## 2011-05-31 ENCOUNTER — Other Ambulatory Visit (HOSPITAL_COMMUNITY): Payer: Self-pay | Admitting: Neurosurgery

## 2011-05-31 DIAGNOSIS — R52 Pain, unspecified: Secondary | ICD-10-CM

## 2011-06-02 ENCOUNTER — Ambulatory Visit (HOSPITAL_COMMUNITY)
Admission: RE | Admit: 2011-06-02 | Discharge: 2011-06-02 | Disposition: A | Discharge status: Home / Self Care | Payer: No Typology Code available for payment source | Source: Ambulatory Visit | Attending: Neurosurgery | Admitting: Neurosurgery

## 2011-06-02 DIAGNOSIS — M719 Bursopathy, unspecified: Secondary | ICD-10-CM | POA: Insufficient documentation

## 2011-06-02 DIAGNOSIS — M25519 Pain in unspecified shoulder: Secondary | ICD-10-CM | POA: Insufficient documentation

## 2011-06-02 DIAGNOSIS — R52 Pain, unspecified: Secondary | ICD-10-CM

## 2011-06-02 DIAGNOSIS — M67919 Unspecified disorder of synovium and tendon, unspecified shoulder: Secondary | ICD-10-CM | POA: Insufficient documentation

## 2011-06-02 DIAGNOSIS — M249 Joint derangement, unspecified: Secondary | ICD-10-CM | POA: Insufficient documentation

## 2011-06-21 ENCOUNTER — Encounter: Payer: Self-pay | Admitting: Cardiology

## 2011-06-28 ENCOUNTER — Ambulatory Visit (HOSPITAL_COMMUNITY)
Admission: RE | Admit: 2011-06-28 | Discharge: 2011-06-28 | Disposition: A | Discharge status: Home / Self Care | Payer: No Typology Code available for payment source | Source: Ambulatory Visit | Attending: Orthopedic Surgery | Admitting: Orthopedic Surgery

## 2011-06-28 DIAGNOSIS — IMO0001 Reserved for inherently not codable concepts without codable children: Secondary | ICD-10-CM | POA: Insufficient documentation

## 2011-06-28 DIAGNOSIS — M25619 Stiffness of unspecified shoulder, not elsewhere classified: Secondary | ICD-10-CM | POA: Insufficient documentation

## 2011-06-28 DIAGNOSIS — M6281 Muscle weakness (generalized): Secondary | ICD-10-CM | POA: Insufficient documentation

## 2011-06-28 DIAGNOSIS — M75120 Complete rotator cuff tear or rupture of unspecified shoulder, not specified as traumatic: Secondary | ICD-10-CM | POA: Insufficient documentation

## 2011-06-28 DIAGNOSIS — M25519 Pain in unspecified shoulder: Secondary | ICD-10-CM | POA: Insufficient documentation
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
## 2011-06-28 NOTE — Patient Instructions (Signed)
Educated patient on HEP for shoulder stretches and dowel rod exercises in supine.

## 2011-07-01 ENCOUNTER — Ambulatory Visit (HOSPITAL_COMMUNITY)
Admission: RE | Admit: 2011-07-01 | Discharge: 2011-07-01 | Disposition: A | Discharge status: Home / Self Care | Payer: No Typology Code available for payment source | Source: Ambulatory Visit | Attending: Orthopedic Surgery | Admitting: Orthopedic Surgery

## 2011-07-01 DIAGNOSIS — M6281 Muscle weakness (generalized): Secondary | ICD-10-CM

## 2011-07-01 DIAGNOSIS — M25519 Pain in unspecified shoulder: Secondary | ICD-10-CM

## 2011-07-01 DIAGNOSIS — M75120 Complete rotator cuff tear or rupture of unspecified shoulder, not specified as traumatic: Secondary | ICD-10-CM

## 2011-07-01 NOTE — Progress Notes (Signed)
Occupational Therapy Treatment  Patient Name: Derek [name] MRN: [id number] Today's Date: 07/01/2011 Time In 204 Time Out 236 Manual Therapy 204-224  Therapeutic Exercises 224-236 Visit 2/8  Reassessment due 07/26/11  HPI: Symptoms/Limitations Symptoms: I did some of the exercises. Pain Assessment Currently in Pain?: Yes Pain Score:   5 Pain Location: Shoulder Pain Orientation: Left Pain Type: Acute pain  Precautions/Restrictions    Mobility       Exercise/Treatments Cervical Exercises Shoulder Flexion: Supine;PROM;10 reps (dowel 10 reps, AROM 10 reps, seated AROM 10 reps) Therapy Ball (Shoulder Flexion): 20 Shoulder Extension: Theraband (red x 10) Shoulder ABduction: Supine;PROM;10 reps (dowel 10 reps, supine and seated AROm x 10 reps) Therapy Ball (Shoulder Abduction): 20 right and left 10 each Shoulder Horizontal ABduction: Supine;PROM;10 reps (dowel 10 reps, supien and seated AROM x 10 reps) Shoulder Retraction: Theraband (red x 10) Row: Theraband (red x 10) Shoulder Internal Rotation: Supine;PROM;10 reps (dowel 10 reps, supine and seated and AROM 10 reps) Shoulder External Rotation: Supine;PROM;10 reps (dowel 10 reps, supine and seated AROM 10 reps) UBE (Upper Arm Bike): 3' and 3' 1.0 Shoulder Exercises Shoulder Flexion: Supine;PROM;10 reps (dowel 10 reps, AROM 10 reps, seated AROM 10 reps) Therapy Ball (Shoulder Flexion): 20 Shoulder Extension: Theraband (red x 10) Shoulder Retraction: Theraband (red x 10) Shoulder Protraction: Supine;PROM;10 reps (dowel 10 reps, supine and seated AROM 10 reps ) Shoulder Horizontal ABduction: Supine;PROM;10 reps (dowel 10 reps, supien and seated AROM x 10 reps) Shoulder ABduction: Supine;PROM;10 reps (dowel 10 reps, supine and seated AROm x 10 reps) Therapy Ball (Shoulder Abduction): 20 right and left 10 each Shoulder External Rotation: Supine;PROM;10 reps (dowel 10 reps, supine and seated AROM 10 reps) Shoulder Internal  Rotation: Supine;PROM;10 reps (dowel 10 reps, supine and seated and AROM 10 reps) Row: Theraband (red x 10) Additional ROM/Strengthening Exercises UBE (Upper Arm Bike): 3' and 3' 1.0 Wall Wash: 2 minutes Thumb Tacks: 2 minutes Elevation/Depression - Shoulder Pro/Retraction: 2 minutes Additional Elbow Exercises UBE (Upper Arm Bike): 3' and 3' 1.0 Neurological Re-education Exercises Shoulder Flexion: Supine;PROM;10 reps (dowel 10 reps, AROM 10 reps, seated AROM 10 reps) Therapy Ball (Shoulder Flexion): 20 Shoulder ABduction: Supine;PROM;10 reps (dowel 10 reps, supine and seated AROm x 10 reps) Therapy Ball (Shoulder Abduction): 20 right and left 10 each Shoulder Protraction: Supine;PROM;10 reps (dowel 10 reps, supine and seated AROM 10 reps ) Shoulder Horizontal ABduction: Supine;PROM;10 reps (dowel 10 reps, supien and seated AROM x 10 reps) Shoulder External Rotation: Supine;PROM;10 reps (dowel 10 reps, supine and seated AROM 10 reps) Shoulder Internal Rotation: Supine;PROM;10 reps (dowel 10 reps, supine and seated and AROM 10 reps) Work Hardening Exercises UBE (Upper Arm Bike): 3' and 3' 1.0 Manual Therapy Manual Therapy: Myofascial release Myofascial Release: MFR and manual stretching to left upper arm, scapular region, and trapezius to decrease fascial restrictions  and increase pain free mobility.    Exercises completed one time only. Goals Long Term Goals Long Term Goal 1 Progress: Progressing toward goal Long Term Goal 2 Progress: Progressing toward goal Long Term Goal 3 Progress: Progressing toward goal Long Term Goal 4 Progress: Progressing toward goal Long Term Goal 5 Progress: Progressing toward goal End of Session Patient Active Problem List  Diagnoses  . HYPERLIPIDEMIA-MIXED  . ANXIETY DEPRESSION  . CAD, NATIVE VESSEL  . EDEMA  . Complete rupture of rotator cuff  . Pain in joint, shoulder region  . Muscle weakness (generalized)   End of Session Activity  Tolerance: Patient tolerated treatment well  General Behavior During Session: [name] for tasks performed Cognition: [facility] for tasks performed OT Assessment and Plan Clinical Impression Statement: A:  Tolerated exercises well.  less pain noted this date. OT Plan: P:  DC dowel in supine.     Derek [name] [place] 07/01/2011, 3:17 PM

## 2011-07-05 ENCOUNTER — Ambulatory Visit (HOSPITAL_COMMUNITY)
Admission: RE | Admit: 2011-07-05 | Discharge: 2011-07-05 | Disposition: A | Discharge status: Home / Self Care | Payer: No Typology Code available for payment source | Source: Ambulatory Visit | Attending: Pulmonary Disease | Admitting: Pulmonary Disease

## 2011-07-05 NOTE — Progress Notes (Signed)
Occupational Therapy Treatment  Patient Name: Derek [name] MRN: [id number] Today's Date: 07/05/2011    OT Time in: 10:35         Time out:   11:27   Manual Therapy: 10:35-10:54 Visit  3/8 Reassessment date:  07/26/11    HPI: Symptoms/Limitations Symptoms: I can tell the exerciese are helping but I can still tell it is weak.  If I try to pick up something heavy at work it feels like my arm gives out. Pain Assessment Currently in Pain?: No/denies  Precautions/Restrictions    Mobility       Exercise/Treatments Cervical Exercises Shoulder Flexion: PROM;10 reps;AROM;15 reps;Supine Therapy Ball (Shoulder Flexion): 20 Shoulder Extension: Theraband Theraband Level (Shoulder Extension): Level 3 (Green) (x12) Shoulder ABduction: PROM;10 reps;AROM;15 reps;Supine Therapy Ball (Shoulder Abduction): 20 right and left 10 each Shoulder Horizontal ABduction: PROM;10 reps;AROM;15 reps;Supine Shoulder Retraction: Theraband Theraband Level (Shoulder Retraction): Level 3 (Green) (x12) Row: Theraband Theraband Level (Row): Level 3 (Green) (x12) Shoulder Internal Rotation: PROM;10 reps;AROM;15 reps;Supine Shoulder External Rotation: PROM;10 reps;AROM;15 reps;Supine UBE (Upper Arm Bike): 3' and 3' 1.5 Shoulder Exercises Shoulder Flexion: PROM;10 reps;AROM;15 reps;Supine Therapy Ball (Shoulder Flexion): 20 Shoulder Extension: Theraband Theraband Level (Shoulder Extension): Level 3 (Green) (x12) Shoulder Retraction: Theraband Theraband Level (Shoulder Retraction): Level 3 (Green) (x12) Shoulder Protraction: AROM;15 reps;Supine Shoulder Horizontal ABduction: PROM;10 reps;AROM;15 reps;Supine Shoulder ABduction: PROM;10 reps;AROM;15 reps;Supine Therapy Ball (Shoulder Abduction): 20 right and left 10 each Shoulder External Rotation: PROM;10 reps;AROM;15 reps;Supine Shoulder Internal Rotation: PROM;10 reps;AROM;15 reps;Supine Row: Theraband Theraband Level (Row): Level 3 (Green)  (x12) Additional ROM/Strengthening Exercises UBE (Upper Arm Bike): 3' and 3' 1.5 Wall Wash: 3' Thumb Tacks: 3' Elevation/Depression - Shoulder Pro/Retraction: 3' Additional Elbow Exercises UBE (Upper Arm Bike): 3' and 3' 1.5 Neurological Re-education Exercises Shoulder Flexion: PROM;10 reps;AROM;15 reps;Supine Therapy Ball (Shoulder Flexion): 20 Shoulder ABduction: PROM;10 reps;AROM;15 reps;Supine Therapy Ball (Shoulder Abduction): 20 right and left 10 each Shoulder Protraction: AROM;15 reps;Supine Shoulder Horizontal ABduction: PROM;10 reps;AROM;15 reps;Supine Shoulder External Rotation: PROM;10 reps;AROM;15 reps;Supine Shoulder Internal Rotation: PROM;10 reps;AROM;15 reps;Supine Work Hardening Exercises UBE (Upper Arm Bike): 3' and 3' 1.5     Goals Long Term Goals Long Term Goal 1 Progress: Progressing toward goal Long Term Goal 2 Progress: Progressing toward goal Long Term Goal 3 Progress: Met Long Term Goal 4 Progress: Met Long Term Goal 5 Progress: Progressing toward goal End of Session Patient Active Problem List  Diagnoses  . HYPERLIPIDEMIA-MIXED  . ANXIETY DEPRESSION  . CAD, NATIVE VESSEL  . EDEMA  . Complete rupture of rotator cuff  . Pain in joint, shoulder region  . Muscle weakness (generalized)   End of Session Activity Tolerance: Patient tolerated treatment well OT Assessment and Plan Clinical Impression Statement: d/c dowel added AROM supine Prognosis: Good OT Plan: increse reps with AROM   Thera Flake, Ved Martos L 07/05/2011, 11:23 AM

## 2011-07-08 ENCOUNTER — Ambulatory Visit (HOSPITAL_COMMUNITY)
Admission: RE | Admit: 2011-07-08 | Discharge: 2011-07-08 | Disposition: A | Discharge status: Home / Self Care | Payer: No Typology Code available for payment source | Source: Ambulatory Visit | Attending: Pulmonary Disease | Admitting: Pulmonary Disease

## 2011-07-08 NOTE — Progress Notes (Signed)
Occupational Therapy Treatment  Patient Name: Derek [name] MRN: [id number] Today's Date: 07/08/2011   Time In :412             Time Out : 508 Manual therapy [phone] Therapeutic [id number]   Visit 4/8 Reassess on   07/26/2011  HPI: Symptoms/Limitations Symptoms: I have been doing those exercises Pain Assessment Currently in Pain?: Yes Pain Score:   4 Pain Location: Arm Pain Orientation: Anterior;Left Pain Type: Chronic pain Pain Onset: More than a month ago Pain Frequency: Intermittent  Precautions/Restrictions    Mobility       Exercise/Treatments Cervical Exercises Shoulder Flexion: PROM;Strengthening;Supine;Other reps (comment);Seated;10 reps (12 supine) Bar Weights/Barbell (Shoulder Flexion): 1 lb (supine and seated) Therapy Ball (Shoulder Flexion): 20 Shoulder Extension: Theraband;15 reps Theraband Level (Shoulder Extension): Level 3 (Green) Shoulder ABduction: PROM;Strengthening;Supine;Other reps (comment);Seated;10 reps (12) Bar Weights/Barbell (Shoulder Abduction): 1 lb (supine and seated) Therapy Ball (Shoulder Abduction): 20 right and left 10 each Shoulder Horizontal ABduction: PROM;Strengthening;Supine;Other reps (comment);Seated;10 reps (12) Bar Weights/Barbell (Shoulder Horizontal Abduction): 1 lb (supine and seated) Shoulder Retraction: Theraband;15 reps Theraband Level (Shoulder Retraction): Level 3 (Green) Row: Theraband;15 reps Theraband Level (Row): Level 3 (Green) Shoulder Internal Rotation: PROM;Strengthening;Supine;Other reps (comment);Seated;10 reps (12) Bar Weights/Barbell (Shoulder Internal Rotation): 1 lb (supine and seated) Shoulder External Rotation: PROM;Strengthening;Supine;Other reps (comment);Seated;10 reps (12) Bar Weights/Barbell (Shoulder External Rotation): 1 lb (supine and seated) UBE (Upper Arm Bike): 3' and 3' 2.0 Shoulder Exercises Shoulder Flexion: PROM;Strengthening;Supine;Other reps (comment);Seated;10 reps (12  supine) Therapy Ball (Shoulder Flexion): 20 Bar Weights/Barbell (Shoulder Flexion): 1 lb (supine and seated) Shoulder Extension: Theraband;15 reps Theraband Level (Shoulder Extension): Level 3 (Green) Shoulder Retraction: Theraband;15 reps Theraband Level (Shoulder Retraction): Level 3 (Green) Shoulder Protraction: PROM;Strengthening;Supine;Other (comment);Seated;10 reps (12 reps) Shoulder Horizontal ABduction: PROM;Strengthening;Supine;Other reps (comment);Seated;10 reps (12) Bar Weights/Barbell (Shoulder Horizontal Abduction): 1 lb (supine and seated) Shoulder ABduction: PROM;Strengthening;Supine;Other reps (comment);Seated;10 reps (12) Therapy Ball (Shoulder Abduction): 20 right and left 10 each Bar Weights/Barbell (Shoulder Abduction): 1 lb (supine and seated) Shoulder External Rotation: PROM;Strengthening;Supine;Other reps (comment);Seated;10 reps (12) Bar Weights/Barbell (Shoulder External Rotation): 1 lb (supine and seated) Shoulder Internal Rotation: PROM;Strengthening;Supine;Other reps (comment);Seated;10 reps (12) Bar Weights/Barbell (Shoulder Internal Rotation): 1 lb (supine and seated) Row: Theraband;15 reps Theraband Level (Row): Level 3 (Green) Additional ROM/Strengthening Exercises UBE (Upper Arm Bike): 3' and 3' 2.0 Wall Wash: 4 Thumb Tacks: 3' Elevation/Depression - Shoulder Pro/Retraction: 3' Additional Elbow Exercises UBE (Upper Arm Bike): 3' and 3' 2.0 Neurological Re-education Exercises Shoulder Flexion: PROM;Strengthening;Supine;Other reps (comment);Seated;10 reps (12 supine) Therapy Ball (Shoulder Flexion): 20 Bar Weights/Barbell (Shoulder Flexion): 1 lb (supine and seated) Shoulder ABduction: PROM;Strengthening;Supine;Other reps (comment);Seated;10 reps (12) Therapy Ball (Shoulder Abduction): 20 right and left 10 each Bar Weights/Barbell (Shoulder Abduction): 1 lb (supine and seated) Shoulder Protraction: PROM;Strengthening;Supine;Other (comment);Seated;10  reps (12 reps) Shoulder Horizontal ABduction: PROM;Strengthening;Supine;Other reps (comment);Seated;10 reps (12) Bar Weights/Barbell (Shoulder Horizontal Abduction): 1 lb (supine and seated) Shoulder External Rotation: PROM;Strengthening;Supine;Other reps (comment);Seated;10 reps (12) Bar Weights/Barbell (Shoulder External Rotation): 1 lb (supine and seated) Shoulder Internal Rotation: PROM;Strengthening;Supine;Other reps (comment);Seated;10 reps (12) Bar Weights/Barbell (Shoulder Internal Rotation): 1 lb (supine and seated) Work Hardening Exercises UBE (Upper Arm Bike): 3' and 3' 2.0 Manual Therapy Manual Therapy: Myofascial release Myofascial Release: MFR and manuel stretching to left upper extremity to decrease restrictions and increase pain free ROM  done [phone]   Goals Long Term Goals Long Term Goal 1 Progress: Progressing toward goal Long Term Goal 2 Progress: Progressing toward goal Long Term Goal 3 Progress:  Progressing toward goal Long Term Goal 4 Progress: Progressing toward goal Long Term Goal 5 Progress: Progressing toward goal End of Session Patient Active Problem List  Diagnoses  . HYPERLIPIDEMIA-MIXED  . ANXIETY DEPRESSION  . CAD, NATIVE VESSEL  . EDEMA  . Complete rupture of rotator cuff  . Pain in joint, shoulder region  . Muscle weakness (generalized)   End of Session Activity Tolerance: Patient tolerated treatment well OT Assessment and Plan Clinical Impression Statement: added seated with 1# with and supine with 1# Prognosis: Good OT Plan: cues to keep shoulders depressed during ex.  Encouraged patient to follow over to work with good form.   Thera Flake, Decarla Siemen L 07/08/2011, 5:07 PM

## 2011-07-12 ENCOUNTER — Ambulatory Visit (HOSPITAL_COMMUNITY)
Admission: RE | Admit: 2011-07-12 | Discharge: 2011-07-12 | Disposition: A | Discharge status: Home / Self Care | Payer: No Typology Code available for payment source | Source: Ambulatory Visit | Attending: Pulmonary Disease | Admitting: Pulmonary Disease

## 2011-07-12 NOTE — Progress Notes (Signed)
Occupational Therapy Treatment  Patient Name: Derek [name] MRN: [id number] Today's Date: 07/12/2011   Time In:   11:10          Time Out :12:09 Manual therapy 11:10-11:29 Therapeutic Exercise11:29-12:09  Visit 4/8  Reassess on 07/26/2011    HPI: Pain Assessment Pain Score:   6 Pain Location: Shoulder Pain Orientation: Anterior;Left Pain Type: Chronic pain Pain Frequency: Intermittent            Exercise/Treatments Cervical Exercises Shoulder Flexion: PROM;Strengthening;Left;15 reps;Supine;Seated Bar Weights/Barbell (Shoulder Flexion): 1 lb Therapy Ball (Shoulder Flexion): 20 Shoulder Extension: Theraband;15 reps Theraband Level (Shoulder Extension): Level 3 (Green) Shoulder ABduction: PROM;Strengthening;Left;15 reps;Supine;Seated Bar Weights/Barbell (Shoulder Abduction): 1 lb Therapy Ball (Shoulder Abduction): 20 right and left 10 each Shoulder Horizontal ABduction: PROM;Strengthening;Left;15 reps;Supine;Seated Bar Weights/Barbell (Shoulder Horizontal Abduction): 1 lb Shoulder Retraction: Theraband;15 reps Theraband Level (Shoulder Retraction): Level 3 (Green) Row: Theraband;15 reps Theraband Level (Row): Level 3 (Green) Shoulder Internal Rotation: PROM;Strengthening;Left;15 reps;Supine;Seated Bar Weights/Barbell (Shoulder Internal Rotation): 1 lb Shoulder External Rotation: PROM;Strengthening;Left;15 reps;Supine;Seated Bar Weights/Barbell (Shoulder External Rotation): 1 lb UBE (Upper Arm Bike): 3' and 3' 2.5 Shoulder Exercises Shoulder Flexion: PROM;Strengthening;Left;15 reps;Supine;Seated Therapy Ball (Shoulder Flexion): 20 Bar Weights/Barbell (Shoulder Flexion): 1 lb Shoulder Extension: Theraband;15 reps Theraband Level (Shoulder Extension): Level 3 (Green) Shoulder Retraction: Theraband;15 reps Theraband Level (Shoulder Retraction): Level 3 (Green) Shoulder Protraction: PROM;Strengthening;Left;15 reps;Supine;Seated Shoulder Horizontal ABduction:  PROM;Strengthening;Left;15 reps;Supine;Seated Bar Weights/Barbell (Shoulder Horizontal Abduction): 1 lb Shoulder ABduction: PROM;Strengthening;Left;15 reps;Supine;Seated Therapy Ball (Shoulder Abduction): 20 right and left 10 each Bar Weights/Barbell (Shoulder Abduction): 1 lb Shoulder External Rotation: PROM;Strengthening;Left;15 reps;Supine;Seated Bar Weights/Barbell (Shoulder External Rotation): 1 lb Shoulder Internal Rotation: PROM;Strengthening;Left;15 reps;Supine;Seated Bar Weights/Barbell (Shoulder Internal Rotation): 1 lb Row: Theraband;15 reps Theraband Level (Row): Level 3 (Green) Additional ROM/Strengthening Exercises UBE (Upper Arm Bike): 3' and 3' 2.5 Wall Wash: 4 Thumb Tacks: 3' Elevation/Depression - Shoulder Pro/Retraction: 3' Additional Elbow Exercises UBE (Upper Arm Bike): 3' and 3' 2.5 Neurological Re-education Exercises Shoulder Flexion: PROM;Strengthening;Left;15 reps;Supine;Seated Therapy Ball (Shoulder Flexion): 20 Bar Weights/Barbell (Shoulder Flexion): 1 lb Shoulder ABduction: PROM;Strengthening;Left;15 reps;Supine;Seated Therapy Ball (Shoulder Abduction): 20 right and left 10 each Bar Weights/Barbell (Shoulder Abduction): 1 lb Shoulder Protraction: PROM;Strengthening;Left;15 reps;Supine;Seated Shoulder Horizontal ABduction: PROM;Strengthening;Left;15 reps;Supine;Seated Bar Weights/Barbell (Shoulder Horizontal Abduction): 1 lb Shoulder External Rotation: PROM;Strengthening;Left;15 reps;Supine;Seated Bar Weights/Barbell (Shoulder External Rotation): 1 lb Shoulder Internal Rotation: PROM;Strengthening;Left;15 reps;Supine;Seated Bar Weights/Barbell (Shoulder Internal Rotation): 1 lb Work Hardening Exercises UBE (Upper Arm Bike): 3' and 3' 2.5     Goals Long Term Goals Long Term Goal 1: Patient will return to prior level of independence with all daily activities. Long Term Goal 1 Progress: Progressing toward goal Long Term Goal 2: Patient will decrease  pain to 2/10 in his left shoulder while working. Long Term Goal 2 Progress: Progressing toward goal Long Term Goal 3: Patient will decrease fascial restrictions to trace in his left shoulder region. Long Term Goal 3 Progress: Progressing toward goal Long Term Goal 4: Patient will increase left shoulder AROM to WNL for increased independence with ADLs.  Long Term Goal 4 Progress: Progressing toward goal Long Term Goal 5: Patien will increase left shoulder strength to 5/5 for increased independence lifting heavy boxes overhead at work. Long Term Goal 5 Progress: Progressing toward goal End of Session Patient Active Problem List  Diagnoses  . HYPERLIPIDEMIA-MIXED  . ANXIETY DEPRESSION  . CAD, NATIVE VESSEL  . EDEMA  . Complete rupture of rotator cuff  . Pain in joint, shoulder region  .  Muscle weakness (generalized)   End of Session Activity Tolerance: Patient tolerated treatment well General Behavior During Session: [facility] for tasks performed Cognition: [facility] for tasks performed OT Assessment and Plan Clinical Impression Statement: continues to require cues physical and verbal to keep shoulder depressed.  Added 1# to wall wash. Prognosis: Good OT Frequency: Min 2X/week OT Duration: 4 weeks OT Treatment/Interventions: Therapeutic exercise;Manual therapy;Therapeutic activities;Patient/family education;Other (comment) (heat or ice as needed) OT Plan: Increse supine to 2#.   Thera Flake, Sharone Almond L 07/12/2011, 12:14 PM

## 2011-07-12 NOTE — Progress Notes (Deleted)
Occupational Therapy Treatment  Patient Name: Derek [name] MRN: [id number] Today's Date: 07/12/2011  HPI: Pain Assessment Pain Score:   6 Pain Location: Shoulder Pain Orientation: Anterior;Left Pain Type: Chronic pain Pain Frequency: Intermittent    Exercise/Treatments  07/12/11 1100  Cervical Exercises  Shoulder Flexion PROM;Strengthening;Left;15 reps;Supine;Seated  Bar Weights/Barbell (Shoulder Flexion) 1 lb  Therapy Ball (Shoulder Flexion) 20  Shoulder Extension Theraband;15 reps  Theraband Level (Shoulder Extension) Level 3 (Green)  Shoulder ABduction PROM;Strengthening;Left;15 reps;Supine;Seated  Bar Weights/Barbell (Shoulder Abduction) 1 lb  Therapy Ball (Shoulder Abduction) 20 right and left 10 each  Shoulder Horizontal ABduction PROM;Strengthening;Left;15 reps;Supine;Seated  Bar Weights/Barbell (Shoulder Horizontal Abduction) 1 lb  Shoulder Retraction Theraband;15 reps  Theraband Level (Shoulder Retraction) Level 3 (Green)  Row [employer] reps  Theraband Level (Row) Level 3 (Green)  Shoulder Internal Rotation PROM;Strengthening;Left;15 reps;Supine;Seated  Bar Weights/Barbell (Shoulder Internal Rotation) 1 lb  Shoulder External Rotation PROM;Strengthening;Left;15 reps;Supine;Seated  Bar Weights/Barbell (Shoulder External Rotation) 1 lb  UBE (Upper Arm Bike) 3' and 3' 2.5  Shoulder Exercises  Shoulder Protraction PROM;Strengthening;Left;15 reps;Supine;Seated  Additional ROM/Strengthening Exercises  Wall Wash 4  Thumb Tacks 3'  Elevation/Depression - Shoulder Pro/Retraction 3'  Manual Therapy  Manual Therapy Myofascial release  Myofascial Release MFR and manuel stretching to increase pain free ROM and decrease pain.  [id number]      Goals   End of Session Patient Active Problem List  Diagnoses  . HYPERLIPIDEMIA-MIXED  . ANXIETY DEPRESSION  . CAD, NATIVE VESSEL  . EDEMA  . Complete rupture of rotator cuff  . Pain in joint, shoulder region  . Muscle  weakness (generalized)    A:  See doc flowsheet P:  See doc flowsheet   Meryl Crutch 07/12/2011, 4:10 PM

## 2011-07-20 ENCOUNTER — Ambulatory Visit (HOSPITAL_COMMUNITY)
Admission: RE | Admit: 2011-07-20 | Discharge: 2011-07-20 | Disposition: A | Discharge status: Home / Self Care | Payer: No Typology Code available for payment source | Source: Ambulatory Visit | Attending: Pulmonary Disease | Admitting: Pulmonary Disease

## 2011-07-20 DIAGNOSIS — M6281 Muscle weakness (generalized): Secondary | ICD-10-CM | POA: Insufficient documentation

## 2011-07-20 DIAGNOSIS — M25519 Pain in unspecified shoulder: Secondary | ICD-10-CM | POA: Insufficient documentation

## 2011-07-20 DIAGNOSIS — M75120 Complete rotator cuff tear or rupture of unspecified shoulder, not specified as traumatic: Secondary | ICD-10-CM

## 2011-07-20 DIAGNOSIS — M25619 Stiffness of unspecified shoulder, not elsewhere classified: Secondary | ICD-10-CM | POA: Insufficient documentation

## 2011-07-20 DIAGNOSIS — IMO0001 Reserved for inherently not codable concepts without codable children: Secondary | ICD-10-CM | POA: Insufficient documentation

## 2011-07-20 NOTE — Progress Notes (Signed)
Occupational Therapy Treatment  Patient Name: DEMONT DOCHERTY MRN: [id number] Today's Date: 07/20/2011 Time In: 933 Time Out : 1025 Manual therapy [id number] Therapeutic Exercise [id number]  Visit 6/8  Reassess on 07/26/2011   HPI: Symptoms/Limitations Symptoms: It hurts after I do the therapy, but it eases off. Pain Assessment Pain Score:   6 Pain Location: Shoulder Pain Orientation: Left Pain Type: Acute pain           Exercise/Treatments Shoulder Exercises Shoulder Flexion: Supine;PROM;Strengthening;Seated;10 reps (2 pounds in seated and supine) Therapy Ball (Shoulder Flexion): 20 Shoulder Extension: Theraband;15 reps;Other (comment) (green ) Shoulder Retraction: Theraband;15 reps (gfreen) Shoulder Protraction: Supine;PROM;Strengthening;Seated;10 reps (2 pounds x 10 reps seated and supine) Shoulder Horizontal ABduction: Supine;PROM;Strengthening;Seated;10 reps (2 pounds x 10 reps seated and supine) Shoulder ABduction: Supine;PROM;Strengthening;Seated;10 reps (2 pounds seated and supine 10 reps each) Therapy Ball (Shoulder Abduction): 20 right and left 10 each Shoulder External Rotation: Supine;PROM;Strengthening;Seated;10 reps (2 pounds supine and seated) Shoulder Internal Rotation: Supine;PROM;Strengthening;10 reps;Seated (2 pounds x 10 reps seated and supine) Row: Theraband;15 reps;Other (comment) (green) Additional ROM/Strengthening Exercises UBE (Upper Arm Bike): 3' and 3' 2.5 Cybex Press: 1.5 plates x 10 reps Cybex Row: 1.5 plates x 10 reps Wall Wash: 3 min  with 1 pound Thumb Tacks: 1' Elevation/Depression - Shoulder Pro/Retraction: 1'  Manual Therapy Manual Therapy: Myofascial release Myofascial Release: MFR and manual stretching to left upper arm, anterior shoulder, scapular region to decrease restrictions and increase AROM in painfree range.  One specific restriction noted in supraspinatus.   Goals Long Term Goals Long Term Goal 1 Progress: Progressing toward  goal Long Term Goal 2 Progress: Progressing toward goal Long Term Goal 3 Progress: Progressing toward goal Long Term Goal 4 Progress: Progressing toward goal Long Term Goal 5 Progress: Progressing toward goal End of Session Patient Active Problem List  Diagnoses  . HYPERLIPIDEMIA-MIXED  . ANXIETY DEPRESSION  . CAD, NATIVE VESSEL  . EDEMA  . Complete rupture of rotator cuff  . Pain in joint, shoulder region  . Muscle weakness (generalized)   End of Session Activity Tolerance: Patient tolerated treatment well General Behavior During Session: [facility] for tasks performed Cognition: [facility] for tasks performed OT Assessment and Plan Clinical Impression Statement: A:  Added one pound to wall wash.  Added cybex press and row.  Increased to 2 pounds with seated and supine strengthening. OT Plan: P:  Increase to 15 reps with strengthening.  Increase to 3.0 with UBE.  Increase to 4 min with wall wash.   [name] Helene OTR/L 07/20/2011, 10:32 AM

## 2011-07-21 ENCOUNTER — Ambulatory Visit (HOSPITAL_COMMUNITY)
Admission: RE | Admit: 2011-07-21 | Discharge: 2011-07-21 | Disposition: A | Discharge status: Home / Self Care | Payer: No Typology Code available for payment source | Source: Ambulatory Visit | Attending: Occupational Therapy | Admitting: Occupational Therapy

## 2011-07-21 NOTE — Progress Notes (Addendum)
Occupational Therapy Treatment  Patient Name: Derek [name] MRN: [id number] Today's Date: 07/21/2011   Time In: 358 Time Out :  [id number] Manual therapy [id number]  Therapeutic [id number] Visit 7/8  Reassess on 07/26/2011    HPI: Symptoms/Limitations Symptoms: it hurts mostly because of the weather.  my hip bothers me more than the shoulder sometimes. Pain Assessment Currently in Pain?: Yes Pain Score:   5 Pain Location: Shoulder Pain Orientation: Left Pain Type: Acute pain Pain Onset: More than a month ago Pain Frequency: Intermittent  Exercise/Treatments  Shoulder Exercises Shoulder Flexion PROM;10 reps;Strengthening;15 reps;Supine;Seated Therapy Ball (Shoulder Flexion) 20 Bar Weights/Barbell (Shoulder Flexion) 2 lbs (supine and seated) Shoulder Extension Theraband;15 reps;Other (comment) Theraband Level (Shoulder Extension) Level 3 (Green) Shoulder Retraction Theraband;15 reps Theraband Level (Shoulder Retraction) Level 3 (Green) Shoulder Protraction Strengthening;15 reps;Supine;Seated Bar Weights/Barbell (Shoulder Protraction) 2 lbs Shoulder Horizontal ABduction PROM;10 reps;Strengthening;15 reps;Supine;Seated Bar Weights/Barbell (Shoulder Horizontal Abduction) 2 lbs (supine and seated) Shoulder ABduction PROM;10 reps;Strengthening;15 reps;Supine;Seated Therapy Ball (Shoulder Abduction) 20 right and left 10 each Bar Weights/Barbell (Shoulder Abduction) 2 lbs (supine and seated) Shoulder External Rotation PROM;10 reps;Strengthening;15 reps;Supine;Seated Bar Weights/Barbell (Shoulder External Rotation) 2 lbs (supine and seated) Shoulder Internal Rotation PROM;10 reps;Strengthening;15 reps;Supine;Seated Bar Weights/Barbell (Shoulder Internal Rotation) 2 lbs (supine and seated) [employer] reps;Other (comment) Theraband Level (Row) Level 3 (Green) Additional ROM/Strengthening Exercises UBE (Upper Arm Bike) 3' and 3' 3.0 Cybex Press 1.5 plates x 15 reps Cybex  Row 1.5 plates x 15 reps Wall Wash 4 min  with 1 pound Thumb Tacks 1' Elevation/Depression - Shoulder Pro/Retraction 1'   Manual Therapy Manual Therapy: Myofascial release Myofascial Release: MFR and manuel stretching to decrease pain and restrictions and increase painfree AROM to left shoulder and scapula region.   Goals Long Term Goals Long Term Goal 1: Patient will return to prior level of independence with all daily activities. Long Term Goal 1 Progress: Progressing toward goal Long Term Goal 2: Patient will decrease pain to 2/10 in his left shoulder while working. Long Term Goal 2 Progress: Progressing toward goal Long Term Goal 3: Patient will decrease fascial restrictions to trace in his left shoulder region. Long Term Goal 3 Progress: Progressing toward goal Long Term Goal 4: Patient will increase left shoulder AROM to WNL for increased independence with ADLs.  Long Term Goal 4 Progress: Progressing toward goal Long Term Goal 5: Patien will increase left shoulder strength to 5/5 for increased independence lifting heavy boxes overhead at work. Long Term Goal 5 Progress: Progressing toward goal End of Session Patient Active Problem List  Diagnoses  . HYPERLIPIDEMIA-MIXED  . ANXIETY DEPRESSION  . CAD, NATIVE VESSEL  . EDEMA  . Complete rupture of rotator cuff  . Pain in joint, shoulder region  . Muscle weakness (generalized)   End of Session Activity Tolerance: Patient tolerated treatment well OT Assessment and Plan Clinical Impression Statement: increased wall wash time and  resistance on UBE.  Added T-band to HEP with written handout for scapula stab exercises. Prognosis: Good OT Treatment/Interventions: Therapeutic exercise;Manual therapy;Patient/family education OT Plan: d/c tband to hep  Naydene Kamrowski L. Arthor Gorter, COTA/L  07/21/2011, 4:56 PM

## 2011-07-23 ENCOUNTER — Ambulatory Visit (HOSPITAL_COMMUNITY): Payer: No Typology Code available for payment source | Admitting: Specialist

## 2011-07-26 ENCOUNTER — Ambulatory Visit (HOSPITAL_COMMUNITY)
Admission: RE | Admit: 2011-07-26 | Discharge: 2011-07-26 | Disposition: A | Discharge status: Home / Self Care | Payer: No Typology Code available for payment source | Source: Ambulatory Visit | Attending: Occupational Therapy | Admitting: Occupational Therapy

## 2011-07-26 NOTE — Progress Notes (Signed)
Occupational Therapy Treatment  Patient Name: Derek [name] MRN: [id number] Today's Date: 07/26/2011  Time In: 109 Time Out : 155  Manual therapy 109-127  Therapeutic Exercise 128-155  Visit 8/8  Reassess on 07/26/2011     Pain Assessment Currently in Pain?: Yes Pain Score:   6 Pain Location: Shoulder Pain Orientation: Upper Pain Type: Acute pain Pain Frequency: Other (Comment) (hurts most at night and interrupts sleep)    Exercise/Treatments Cervical Exercises Shoulder Exercises Shoulder Flexion: PROM;10 reps;Strengthening;15 reps;Supine;Seated Bar Weights/Barbell (Shoulder Flexion): 2 lbs Shoulder Extension: Theraband;15 reps;Other (comment) Theraband Level (Shoulder Extension): Level 3 (Green) Shoulder Retraction: Theraband;15 reps Theraband Level (Shoulder Retraction): Level 3 (Green) Shoulder Horizontal ABduction: PROM;10 reps;Strengthening;15 reps;Supine;Seated Bar Weights/Barbell (Shoulder Horizontal Abduction): 2 lbs Shoulder ABduction: PROM;10 reps;Strengthening;15 reps;Supine;Seated Bar Weights/Barbell (Shoulder Abduction): 2 lbs Shoulder External Rotation: PROM;10 reps;Strengthening;15 reps;Supine;Seated Bar Weights/Barbell (Shoulder External Rotation): 2 lbs Shoulder Internal Rotation: PROM;10 reps;Strengthening;15 reps;Supine;Seated Bar Weights/Barbell (Shoulder Internal Rotation): 2 lbs Row: Theraband;15 reps;Other (comment) Theraband Level (Row): Level 3 (Green) Additional ROM/Strengthening Exercises UBE (Upper Arm Bike): 3' and 3' 3.5 Additional Elbow Exercises UBE (Upper Arm Bike): 3' and 3' 3.5 Manual Therapy Myofascial Release: MFR to Left upper arm, scapular region, and trapezius to decrease fascial restrictions. Patient has moderate fascial restrictions noted in his left scapular region   Goals Long Term Goals Long Term Goal 1: Patient will return to prior level of independence with all daily activities. Long Term Goal 1 Progress: Met  (continues to hurt when picking up items limiting work functi) Long Term Goal 2: Patient will decrease pain to 2/10 in his left shoulder while working. Long Term Goal 2 Progress: Progressing toward goal Long Term Goal 3: Patient will decrease fascial restrictions to trace in his left shoulder region. Long Term Goal 3 Progress: Met Long Term Goal 4: Patient will increase left shoulder AROM to WNL for increased independence with ADLs.  Long Term Goal 4 Progress: Met Long Term Goal 5: Patien will increase left shoulder strength to 5/5 for increased independence lifting heavy boxes overhead at work. Long Term Goal 5 Progress: Met End of Session Patient Active Problem List  Diagnoses  . HYPERLIPIDEMIA-MIXED  . ANXIETY DEPRESSION  . CAD, NATIVE VESSEL  . EDEMA  . Complete rupture of rotator cuff  . Pain in joint, shoulder region  . Muscle weakness (generalized)   End of Session Activity Tolerance: Patient tolerated treatment well OT Assessment and Plan Clinical Impression Statement: See progress/d/c note Prognosis: Good OT Plan: d/c to hep and follow up with MD.  Rondel Oh L. Kimaria Struthers, COTA/L  07/26/2011, 1:58 PM

## 2011-07-27 ENCOUNTER — Ambulatory Visit (HOSPITAL_COMMUNITY): Payer: No Typology Code available for payment source | Admitting: Specialist

## 2011-09-01 ENCOUNTER — Other Ambulatory Visit: Payer: Self-pay | Admitting: Cardiology

## 2011-10-07 ENCOUNTER — Encounter: Payer: Self-pay | Admitting: Cardiology

## 2011-10-27 ENCOUNTER — Encounter: Payer: Self-pay | Admitting: Cardiology

## 2011-10-27 ENCOUNTER — Ambulatory Visit (INDEPENDENT_AMBULATORY_CARE_PROVIDER_SITE_OTHER): Payer: Medicare Other | Admitting: Cardiology

## 2011-10-27 VITALS — BP 155/77 | HR 59 | Ht 71.0 in | Wt 193.0 lb

## 2011-10-27 DIAGNOSIS — E785 Hyperlipidemia, unspecified: Secondary | ICD-10-CM

## 2011-10-27 DIAGNOSIS — F341 Dysthymic disorder: Secondary | ICD-10-CM

## 2011-10-27 DIAGNOSIS — I2510 Atherosclerotic heart disease of native coronary artery without angina pectoris: Secondary | ICD-10-CM

## 2011-10-27 MED ORDER — BUPROPION HCL ER (SR) 100 MG PO TB12: 100.0000 mg | ORAL_TABLET | Freq: Two times a day (BID) | ORAL | Status: DC

## 2011-10-27 MED ORDER — METOPROLOL TARTRATE 25 MG PO TABS: 25.0000 mg | ORAL_TABLET | Freq: Two times a day (BID) | ORAL | Status: DC

## 2011-10-27 NOTE — Assessment & Plan Note (Signed)
No recurrent chest pain. No further cardiac workup needed. Continue risk factor modification and current cardiac medications

## 2011-10-27 NOTE — Assessment & Plan Note (Signed)
The patient has requested a refill on Wellbutrin until he has time to see his primary care physician. I've given him a prescription of Wellbutrin 100 minutes by mouth twice a day.

## 2011-10-27 NOTE — Progress Notes (Signed)
CC: Routine followup in a patient with coronary artery disease  HPI: The patient is a 65 year old male with history of coronary artery disease status post stent placement to the right coronary artery. He had a catheterization in July 2010 which showed patent stent and otherwise mild nonobstructive coronary artery disease. He reports no recurrent chest pain shortness of breath orthopnea PND. He has no palpitations or syncope. The patient has been struggling with significant depression. He has tried a variety of medications some of which again is significant side effects. He states that is going back to his primary care physician to be put on Wellbutrin. He is asked me if I could provide a prescription the meanwhile until the time to see his primary care physician. Otherwise he stable from a cardiovascular perspective   PMH: reviewed and listed in Problem List in Electronic Records (and see below)  Allergies/SH/FHX : available in Electronic Records for review  Medications: Current Outpatient Prescriptions  Medication Sig Dispense Refill  . allopurinol (ZYLOPRIM) 300 MG tablet Take 300 mg by mouth daily.        Marland Kitchen aspirin EC 325 MG tablet Take 325 mg by mouth daily.        . diazepam (VALIUM) 10 MG tablet Take 10 mg by mouth 2 (two) times daily.        . furosemide (LASIX) 80 MG tablet Take 80 mg by mouth daily.        . metoprolol tartrate (LOPRESSOR) 25 MG tablet Take 1 tablet (25 mg total) by mouth 2 (two) times daily.  60 tablet  6  . Multiple Vitamins-Minerals (MULTIVITAL) tablet Take 1 tablet by mouth daily.        . nitroGLYCERIN (NITROSTAT) 0.4 MG SL tablet Place 0.4 mg under the tongue every 5 (five) minutes as needed.        . Omega-3 Fatty Acids (FISH OIL MAXIMUM STRENGTH) 1200 MG CAPS Take by mouth 2 (two) times daily.        Marland Kitchen omeprazole (PRILOSEC) 20 MG capsule Take 20 mg by mouth daily.        . pravastatin (PRAVACHOL) 80 MG tablet Take 1 tablet (80 mg total) by mouth daily. PLEASE  SEND FUTURE REQUEST TO PCP/NO CHOLESTEROL LABS ON PROFILE HERE  30 tablet  0  . buPROPion (WELLBUTRIN SR) 100 MG 12 hr tablet Take 1 tablet (100 mg total) by mouth 2 (two) times daily.  60 tablet  0    ROS: No nausea or vomiting. No fever or chills.No melena or hematochezia.No bleeding.No claudication  Physical Exam: BP 155/77  Pulse 59  Ht 5\' 11"  (1.803 m)  Wt 193 lb (87.544 kg)  BMI 26.92 kg/m2 General: Well-nourished white male. Neck: Normal carotid upstroke and no carotid bruits Cardiac: Regular rate and rhythm with normal S1-S2 no murmurs or gallops Vascular: No edema. Normal peripheral pulses bilaterally Skin: Warm and dry  12lead ECG: Normal sinus rhythm otherwise normal tracing Limited bedside ECHO: Normal LV function ejection fraction 55%. No wall motion abnormalities   Assessment and Plan

## 2011-10-27 NOTE — Patient Instructions (Signed)
Your physician wants you to follow-up in: 6 months. You will receive a reminder letter in the mail one-two months in advance. If you don't receive a letter, please call our office to schedule the follow-up appointment. Your physician recommends that you continue on your current medications as directed. Please refer to the Current Medication list given to you today.  Wellbutrin 100 mg two times a day--refilled today. Obtain further refills from primary MD.

## 2011-10-27 NOTE — Assessment & Plan Note (Signed)
Apparently followed by the patient's primary care physician

## 2011-12-14 HISTORY — PX: ELBOW SURGERY: SHX618

## 2012-03-03 ENCOUNTER — Encounter (HOSPITAL_COMMUNITY): Payer: Self-pay | Admitting: Pharmacy Technician
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
## 2012-03-14 NOTE — Pre-Procedure Instructions (Signed)
19 Derek [name]  03/14/2012   Your procedure is scheduled on: March 20, 2012 at 0730 am.  [facility] [place].  Call this number if you have problems the morning of surgery: [phone]   Remember:   Do not eat food:After Midnight.  May have clear liquids: up to 4 Hours before arrival until 0130 am  Clear liquids include soda, tea, black coffee, apple or grape juice, broth.  Take these medicines the morning of surgery with A SIP OF WATER: Bupropion (Wellbutrin), Diazepam (Valium), Metoprolol (Lopressor), and Omeprazole (Prilosec)   Do not wear jewelry, make-up or nail polish.  Do not wear lotions, powders, or perfumes. You may wear deodorant.  Do not shave 48 hours prior to surgery.  Do not bring valuables to the hospital.  Contacts, dentures or bridgework may not be worn into surgery.  Leave suitcase in the car. After surgery it may be brought to your room.  For patients admitted to the hospital, checkout time is 11:00 AM the day of discharge.   Patients discharged the day of surgery will not be allowed to drive home.  Name and phone number of your driver:   Special Instructions: CHG Shower Use Special Wash: 1/2 bottle night before surgery and 1/2 bottle morning of surgery.   Please read over the following fact sheets that you were given: Pain Booklet, Coughing and Deep Breathing, MRSA Information and Surgical Site Infection Prevention

## 2012-03-15 ENCOUNTER — Other Ambulatory Visit: Payer: Self-pay

## 2012-03-15 ENCOUNTER — Encounter (HOSPITAL_COMMUNITY): Payer: Self-pay

## 2012-03-15 ENCOUNTER — Encounter (HOSPITAL_COMMUNITY)
Admission: RE | Admit: 2012-03-15 | Discharge: 2012-03-15 | Disposition: A | Discharge status: Home / Self Care | Payer: Medicare Other | Source: Ambulatory Visit | Attending: Specialist | Admitting: Specialist

## 2012-03-15 ENCOUNTER — Other Ambulatory Visit (HOSPITAL_COMMUNITY): Payer: Self-pay | Admitting: Specialist

## 2012-03-15 HISTORY — DX: Acute myocardial infarction, unspecified: I21.9

## 2012-03-15 HISTORY — DX: Unspecified osteoarthritis, unspecified site: M19.90

## 2012-03-15 HISTORY — DX: Depression, unspecified: F32.A

## 2012-03-15 HISTORY — DX: Peripheral vascular disease, unspecified: I73.9

## 2012-03-15 HISTORY — DX: Major depressive disorder, single episode, unspecified: F32.9

## 2012-03-15 HISTORY — DX: Gastro-esophageal reflux disease without esophagitis: K21.9

## 2012-03-15 HISTORY — DX: Anxiety disorder, unspecified: F41.9

## 2012-03-15 LAB — COMPREHENSIVE METABOLIC PANEL
ALT: 38 U/L (ref 0–53)
AST: 27 U/L (ref 0–37)
Albumin: 3.5 g/dL (ref 3.5–5.2)
Alkaline Phosphatase: 75 U/L (ref 39–117)
BUN: 22 mg/dL (ref 6–23)
CO2: 30 mEq/L (ref 19–32)
Calcium: 9.5 mg/dL (ref 8.4–10.5)
Chloride: 104 mEq/L (ref 96–112)
Creatinine, Ser: 1.82 mg/dL — ABNORMAL HIGH (ref 0.50–1.35)
GFR calc Af Amer: 43 mL/min — ABNORMAL LOW (ref >90)
GFR calc non Af Amer: 37 mL/min — ABNORMAL LOW (ref >90)
Glucose, Bld: 73 mg/dL (ref 70–99)
Potassium: 4 mEq/L (ref 3.5–5.1)
Sodium: 141 mEq/L (ref 135–145)
Total Bilirubin: 0.4 mg/dL (ref 0.3–1.2)
Total Protein: 7.1 g/dL (ref 6.0–8.3)

## 2012-03-15 LAB — ABO/RH: ABO/RH(D): B POS

## 2012-03-15 LAB — CBC
HCT: 46.6 % (ref 39.0–52.0)
Hemoglobin: 15.6 g/dL (ref 13.0–17.0)
MCH: 28.8 pg (ref 26.0–34.0)
MCHC: 33.5 g/dL (ref 30.0–36.0)
MCV: 86.1 fL (ref 78.0–100.0)
Platelets: 90 10*3/uL — ABNORMAL LOW (ref 150–400)
RBC: 5.41 MIL/uL (ref 4.22–5.81)
RDW: 14.9 % (ref 11.5–15.5)
WBC: 6.4 10*3/uL (ref 4.0–10.5)

## 2012-03-15 LAB — DIFFERENTIAL
Basophils Absolute: 0.1 10*3/uL (ref 0.0–0.1)
Basophils Relative: 1 % (ref 0–1)
Eosinophils Absolute: 0.2 10*3/uL (ref 0.0–0.7)
Eosinophils Relative: 3 % (ref 0–5)
Lymphocytes Relative: 29 % (ref 12–46)
Lymphs Abs: 1.8 10*3/uL (ref 0.7–4.0)
Monocytes Absolute: 0.5 10*3/uL (ref 0.1–1.0)
Monocytes Relative: 8 % (ref 3–12)
Neutro Abs: 3.8 10*3/uL (ref 1.7–7.7)
Neutrophils Relative %: 59 % (ref 43–77)

## 2012-03-15 LAB — URINALYSIS, ROUTINE W REFLEX MICROSCOPIC
Bilirubin Urine: NEGATIVE
Glucose, UA: NEGATIVE mg/dL
Hgb urine dipstick: NEGATIVE
Ketones, ur: NEGATIVE mg/dL
Leukocytes, UA: NEGATIVE
Nitrite: NEGATIVE
Protein, ur: NEGATIVE mg/dL
Specific Gravity, Urine: 1.025 (ref 1.005–1.030)
Urobilinogen, UA: 0.2 mg/dL (ref 0.0–1.0)
pH: 6 (ref 5.0–8.0)

## 2012-03-15 LAB — TYPE AND SCREEN
ABO/RH(D): B POS
Antibody Screen: NEGATIVE

## 2012-03-15 LAB — SURGICAL PCR SCREEN
MRSA, PCR: NEGATIVE
Staphylococcus aureus: NEGATIVE

## 2012-03-15 LAB — APTT: aPTT: 33 seconds (ref 24–37)

## 2012-03-15 LAB — PROTIME-INR
INR: 1.01 (ref 0.00–1.49)
Prothrombin Time: 13.5 seconds (ref 11.6–15.2)

## 2012-03-15 NOTE — Progress Notes (Addendum)
NOTIFIED ALLISON ZELENAK OF PATIENT C/O A PULLING PAIN TO LEFT RIBCAGE AREA THAT COMES AND GOES.  LAST EPISODE WAS ABOUT 2 WEEKS AGO.   PATIENT STATED HE HAS HAD HX OF HEART STENT , HE LAST SAW DR Dannielle Burn IN November.  ALLISON ASKED THAT I NOTIFY DR DEGENTS OFFICE.  SPOKE WITH DR Dorthula Perfect NURSE GAIL, MADE HER AWARE OF PATIENTS SYMPTOMS AND THAT HE WAS SCHEDULED FOR LT TOTAL HIP REPLACEMENT ON 03/20/12.  APPOINTMENT WAS MADE FOR Friday 03/17/12 AT DR Dorthula Perfect OFFICE FOR CARDIAC CLEARANCE.  PATIENT S  WIFE WAS GIVEN APPOINTMENT TIME AND DATE.

## 2012-03-16 NOTE — Consult Note (Addendum)
Anesthesia:  Patient is a 66 year old male scheduled for a left THR and removal of three cannulated screws on 03/20/12.  He has a history of CAD/MI s/p RCA DES '07 , CRI, chronic idiopathic thrombocytopenia, PVD with intermittent claudication, HLD, GERD, depression, anxiety, edema.    According to Dr. Tennis Must Gent's last office note from 10/27/11 he was stable without chest pain; however during his PAT visit yesterday, 03/15/12, he reported left chest "pulling" approximately two weeks ago.  He was not having any symptoms during his PAT appointment.  Although chest pulling sounded rather atypical for angina, he seemed concerned with his symptoms in regards to his planned procedure.  His PAT nurse contacted Dr. Theora Master office, and they arranged an appointment for him to been seen by Gene Serpe, PA-C on 03/17/12.    EKG on 03/15/12 showed SB at 56 bpm.  At his 10/27/11 appointment, a limited bedside ECHO was done then and showed normal LV function ejection fraction 55%. No wall motion abnormalities.  His last cath was on 06/13/09 (done after an abnormal Myoview showing apical and lateral ischemia) that showed: 1. Patent right coronary artery stent.  2. Mild nonobstructive coronary artery disease as outlined above. (Left main and DIAG no significant stenosis, minimal plaque proximal LAD, LCx with 2 main OM branches with small OM2 with 50% stenosis, proximal to mid RCA stent there was 40% stenosis, mild nonobstructive plaque in the distal RCA, PDA and PL branches without any high-grade stenoses.) 3. Elevated left ventricular end-diastolic pressure. (Aortic pressure is 169/75 with a mean of 111, left  ventricular pressure 173/25.) Continued medical therapy was recommended.  CXR on 03/15/12 showed no acute cardiopulmonary abnormality  Labs noted.  Cr 1.82 which appears stable when compared to his Cr of 1.97on 06/13/09.  PLTs are 90, up from his PLT count of 78 on 06/13/09.  He has known chronic thrombocytopenia.  H/H  15.6/46.6.  Coags are WNL.   I called his PLT count results to [facility] and updated her on patient's appointment with Cardiology. She will have Dr. Louanne Skye review.  Addendum: 03/17/12 1530  Patient was seen today at [facility].  No further Cardiac testing was recommended preoperatively.

## 2012-03-17 ENCOUNTER — Encounter: Payer: Self-pay | Admitting: Physician Assistant

## 2012-03-17 ENCOUNTER — Ambulatory Visit (INDEPENDENT_AMBULATORY_CARE_PROVIDER_SITE_OTHER): Payer: Medicare Other | Admitting: Physician Assistant

## 2012-03-17 VITALS — BP 137/84 | HR 58 | Ht 71.0 in | Wt 201.0 lb

## 2012-03-17 DIAGNOSIS — I2510 Atherosclerotic heart disease of native coronary artery without angina pectoris: Secondary | ICD-10-CM

## 2012-03-17 DIAGNOSIS — E785 Hyperlipidemia, unspecified: Secondary | ICD-10-CM

## 2012-03-17 MED ORDER — ASPIRIN EC 81 MG PO TBEC: 81.0000 mg | DELAYED_RELEASE_TABLET | Freq: Every day | ORAL | Status: DC
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
## 2012-03-17 NOTE — Assessment & Plan Note (Signed)
No ischemic testing indicated, at this point in time. Patient presents with no interim signs/symptoms suggestive of UAP or CHF, since last OV. EKG indicates sinus bradycardia with no acute changes. Patient had nonobstructive CAD with patent RCA stent, in 2010, following a false positive stress test. Therefore, he is cleared to proceed with surgery this coming Monday, 03/17/12, as planned. Recommendation is for patient to decrease ASA to 81 mg daily, once cleared from a surgical standpoint, and to otherwise resume current home medications.

## 2012-03-17 NOTE — Assessment & Plan Note (Signed)
Will reassess lipid status at time of next OV. Target LDL 70 or less, if feasible.

## 2012-03-17 NOTE — Patient Instructions (Signed)
Your physician wants you to follow-up in: 6 months. You will receive a reminder letter in the mail one-two months in advance. If you don't receive a letter, please call our office to schedule the follow-up appointment. Decrease Aspirin to 81 mg when safe to resume following surgery.

## 2012-03-19 MED ORDER — CEFAZOLIN SODIUM-DEXTROSE 2-3 GM-% IV SOLR
2.0000 g | INTRAVENOUS | Status: AC
  Administered 2012-03-20: 2 g via INTRAVENOUS
  Filled 2012-03-19: qty 50

## 2012-03-20 ENCOUNTER — Ambulatory Visit (HOSPITAL_COMMUNITY): Payer: Medicare Other

## 2012-03-20 ENCOUNTER — Ambulatory Visit (HOSPITAL_COMMUNITY): Payer: Medicare Other | Admitting: Vascular Surgery

## 2012-03-20 ENCOUNTER — Encounter (HOSPITAL_COMMUNITY): Payer: Self-pay | Admitting: Specialist

## 2012-03-20 ENCOUNTER — Inpatient Hospital Stay (HOSPITAL_COMMUNITY)
Admission: RE | Admit: 2012-03-20 | Discharge: 2012-03-27 | DRG: 470 | Disposition: A | Discharge status: Home Health | Payer: Medicare Other | Source: Ambulatory Visit | Attending: Specialist | Admitting: Specialist

## 2012-03-20 ENCOUNTER — Encounter (HOSPITAL_COMMUNITY)
Admission: RE | Disposition: A | Discharge status: Home Health | Payer: Self-pay | Source: Ambulatory Visit | Attending: Specialist

## 2012-03-20 ENCOUNTER — Encounter (HOSPITAL_COMMUNITY): Payer: Self-pay | Admitting: Surgery

## 2012-03-20 ENCOUNTER — Encounter (HOSPITAL_COMMUNITY): Payer: Self-pay | Admitting: Vascular Surgery

## 2012-03-20 DIAGNOSIS — Z87891 Personal history of nicotine dependence: Secondary | ICD-10-CM

## 2012-03-20 DIAGNOSIS — Z8249 Family history of ischemic heart disease and other diseases of the circulatory system: Secondary | ICD-10-CM

## 2012-03-20 DIAGNOSIS — D62 Acute posthemorrhagic anemia: Secondary | ICD-10-CM | POA: Diagnosis not present

## 2012-03-20 DIAGNOSIS — I129 Hypertensive chronic kidney disease with stage 1 through stage 4 chronic kidney disease, or unspecified chronic kidney disease: Secondary | ICD-10-CM | POA: Diagnosis present

## 2012-03-20 DIAGNOSIS — M167 Other unilateral secondary osteoarthritis of hip: Principal | ICD-10-CM | POA: Diagnosis present

## 2012-03-20 DIAGNOSIS — E782 Mixed hyperlipidemia: Secondary | ICD-10-CM | POA: Diagnosis present

## 2012-03-20 DIAGNOSIS — I2510 Atherosclerotic heart disease of native coronary artery without angina pectoris: Secondary | ICD-10-CM | POA: Diagnosis present

## 2012-03-20 DIAGNOSIS — F341 Dysthymic disorder: Secondary | ICD-10-CM | POA: Diagnosis present

## 2012-03-20 DIAGNOSIS — F05 Delirium due to known physiological condition: Secondary | ICD-10-CM | POA: Diagnosis not present

## 2012-03-20 DIAGNOSIS — I252 Old myocardial infarction: Secondary | ICD-10-CM

## 2012-03-20 DIAGNOSIS — Z79899 Other long term (current) drug therapy: Secondary | ICD-10-CM

## 2012-03-20 DIAGNOSIS — N183 Chronic kidney disease, stage 3 unspecified: Secondary | ICD-10-CM | POA: Diagnosis present

## 2012-03-20 DIAGNOSIS — Z01812 Encounter for preprocedural laboratory examination: Secondary | ICD-10-CM

## 2012-03-20 DIAGNOSIS — M1652 Unilateral post-traumatic osteoarthritis, left hip: Secondary | ICD-10-CM | POA: Diagnosis present

## 2012-03-20 DIAGNOSIS — R4182 Altered mental status, unspecified: Secondary | ICD-10-CM | POA: Diagnosis not present

## 2012-03-20 DIAGNOSIS — Z472 Encounter for removal of internal fixation device: Secondary | ICD-10-CM

## 2012-03-20 DIAGNOSIS — N138 Other obstructive and reflux uropathy: Secondary | ICD-10-CM | POA: Diagnosis present

## 2012-03-20 DIAGNOSIS — N320 Bladder-neck obstruction: Secondary | ICD-10-CM | POA: Diagnosis present

## 2012-03-20 DIAGNOSIS — N401 Enlarged prostate with lower urinary tract symptoms: Secondary | ICD-10-CM | POA: Diagnosis present

## 2012-03-20 DIAGNOSIS — Z905 Acquired absence of kidney: Secondary | ICD-10-CM

## 2012-03-20 DIAGNOSIS — R0902 Hypoxemia: Secondary | ICD-10-CM | POA: Diagnosis not present

## 2012-03-20 DIAGNOSIS — K219 Gastro-esophageal reflux disease without esophagitis: Secondary | ICD-10-CM | POA: Diagnosis present

## 2012-03-20 DIAGNOSIS — R339 Retention of urine, unspecified: Secondary | ICD-10-CM | POA: Clinically undetermined

## 2012-03-20 DIAGNOSIS — Z9861 Coronary angioplasty status: Secondary | ICD-10-CM

## 2012-03-20 DIAGNOSIS — Z823 Family history of stroke: Secondary | ICD-10-CM

## 2012-03-20 HISTORY — PX: TOTAL HIP ARTHROPLASTY: SHX124

## 2012-03-20 HISTORY — PX: HARDWARE REMOVAL: SHX979

## 2012-03-20 SURGERY — ARTHROPLASTY, HIP, TOTAL,POSTERIOR APPROACH
Anesthesia: General | Site: Hip | Laterality: Left | Wound class: Clean

## 2012-03-20 MED ORDER — ALUM & MAG HYDROXIDE-SIMETH 200-200-20 MG/5ML PO SUSP: 30.0000 mL | ORAL | Status: DC | PRN

## 2012-03-20 MED ORDER — ZOLPIDEM TARTRATE 5 MG PO TABS: 5.0000 mg | ORAL_TABLET | Freq: Every evening | ORAL | Status: DC | PRN

## 2012-03-20 MED ORDER — HYDROMORPHONE 0.3 MG/ML IV SOLN
INTRAVENOUS | Status: DC
  Administered 2012-03-20: 2 mg via INTRAVENOUS
  Administered 2012-03-20: 12:00:00 via INTRAVENOUS
  Administered 2012-03-21: 0.3 mg via INTRAVENOUS
  Administered 2012-03-21: 0.69 mg via INTRAVENOUS

## 2012-03-20 MED ORDER — SODIUM CHLORIDE 0.9 % IR SOLN
Status: DC | PRN
  Administered 2012-03-20: 1000 mL

## 2012-03-20 MED ORDER — MENTHOL 3 MG MT LOZG: 1.0000 | LOZENGE | OROMUCOSAL | Status: DC | PRN

## 2012-03-20 MED ORDER — SORBITOL 70 % SOLN
30.0000 mL | Freq: Every day | Status: DC | PRN
  Administered 2012-03-26: 30 mL via ORAL
  Filled 2012-03-20: qty 30

## 2012-03-20 MED ORDER — METOPROLOL TARTRATE 25 MG PO TABS
25.0000 mg | ORAL_TABLET | Freq: Two times a day (BID) | ORAL | Status: DC
  Administered 2012-03-20 – 2012-03-21 (×3): 25 mg via ORAL
  Filled 2012-03-20 (×5): qty 1

## 2012-03-20 MED ORDER — SODIUM CHLORIDE 0.9 % IJ SOLN: 9.0000 mL | INTRAMUSCULAR | Status: DC | PRN

## 2012-03-20 MED ORDER — METOCLOPRAMIDE HCL 5 MG/ML IJ SOLN: 5.0000 mg | Freq: Three times a day (TID) | INTRAMUSCULAR | Status: DC | PRN

## 2012-03-20 MED ORDER — DIPHENHYDRAMINE HCL 50 MG/ML IJ SOLN: 12.5000 mg | Freq: Four times a day (QID) | INTRAMUSCULAR | Status: DC | PRN

## 2012-03-20 MED ORDER — EPHEDRINE SULFATE 50 MG/ML IJ SOLN
INTRAMUSCULAR | Status: DC | PRN
  Administered 2012-03-20: 10 mg via INTRAVENOUS

## 2012-03-20 MED ORDER — HYDROMORPHONE HCL PF 1 MG/ML IJ SOLN: 0.5000 mg | INTRAMUSCULAR | Status: DC | PRN

## 2012-03-20 MED ORDER — FERROUS SULFATE 325 (65 FE) MG PO TABS
325.0000 mg | ORAL_TABLET | Freq: Two times a day (BID) | ORAL | Status: DC
  Administered 2012-03-20 – 2012-03-22 (×5): 325 mg via ORAL
  Filled 2012-03-20 (×8): qty 1

## 2012-03-20 MED ORDER — PROMETHAZINE HCL 25 MG/ML IJ SOLN: 6.2500 mg | INTRAMUSCULAR | Status: DC | PRN

## 2012-03-20 MED ORDER — FLEET ENEMA 7-19 GM/118ML RE ENEM: 1.0000 | ENEMA | Freq: Once | RECTAL | Status: AC | PRN

## 2012-03-20 MED ORDER — PANTOPRAZOLE SODIUM 40 MG PO TBEC
40.0000 mg | DELAYED_RELEASE_TABLET | Freq: Every day | ORAL | Status: DC
  Administered 2012-03-21 – 2012-03-27 (×7): 40 mg via ORAL
  Filled 2012-03-20 (×7): qty 1

## 2012-03-20 MED ORDER — ACETAMINOPHEN 650 MG RE SUPP: 650.0000 mg | Freq: Four times a day (QID) | RECTAL | Status: DC | PRN

## 2012-03-20 MED ORDER — HYDROMORPHONE HCL PF 1 MG/ML IJ SOLN
0.2500 mg | INTRAMUSCULAR | Status: DC | PRN
  Administered 2012-03-20 (×3): 0.5 mg via INTRAVENOUS

## 2012-03-20 MED ORDER — METHOCARBAMOL 500 MG PO TABS
500.0000 mg | ORAL_TABLET | Freq: Four times a day (QID) | ORAL | Status: DC | PRN
  Administered 2012-03-21 – 2012-03-24 (×2): 500 mg via ORAL
  Filled 2012-03-20 (×2): qty 1

## 2012-03-20 MED ORDER — RIVAROXABAN 10 MG PO TABS
10.0000 mg | ORAL_TABLET | Freq: Every day | ORAL | Status: DC
  Administered 2012-03-22 – 2012-03-27 (×6): 10 mg via ORAL
  Filled 2012-03-20 (×8): qty 1

## 2012-03-20 MED ORDER — ONDANSETRON HCL 4 MG PO TABS: 4.0000 mg | ORAL_TABLET | Freq: Four times a day (QID) | ORAL | Status: DC | PRN

## 2012-03-20 MED ORDER — LIDOCAINE HCL (CARDIAC) 20 MG/ML IV SOLN
INTRAVENOUS | Status: DC | PRN
  Administered 2012-03-20: 100 mg via INTRAVENOUS

## 2012-03-20 MED ORDER — PROPOFOL 10 MG/ML IV BOLUS
INTRAVENOUS | Status: DC | PRN
  Administered 2012-03-20: 200 mg via INTRAVENOUS

## 2012-03-20 MED ORDER — FUROSEMIDE 40 MG PO TABS
40.0000 mg | ORAL_TABLET | Freq: Every day | ORAL | Status: DC
  Administered 2012-03-20 – 2012-03-27 (×7): 40 mg via ORAL
  Filled 2012-03-20 (×8): qty 1

## 2012-03-20 MED ORDER — LACTATED RINGERS IV SOLN
INTRAVENOUS | Status: DC | PRN
  Administered 2012-03-20 (×3): via INTRAVENOUS

## 2012-03-20 MED ORDER — DOCUSATE SODIUM 100 MG PO CAPS
100.0000 mg | ORAL_CAPSULE | Freq: Two times a day (BID) | ORAL | Status: DC
  Administered 2012-03-20 – 2012-03-26 (×12): 100 mg via ORAL
  Filled 2012-03-20 (×15): qty 1

## 2012-03-20 MED ORDER — POLYETHYLENE GLYCOL 3350 17 G PO PACK: 17.0000 g | PACK | Freq: Every day | ORAL | Status: DC | PRN

## 2012-03-20 MED ORDER — GLYCOPYRROLATE 0.2 MG/ML IJ SOLN
INTRAMUSCULAR | Status: DC | PRN
  Administered 2012-03-20: .4 mg via INTRAVENOUS
  Administered 2012-03-20: 0.2 mg via INTRAVENOUS

## 2012-03-20 MED ORDER — METHOCARBAMOL 100 MG/ML IJ SOLN
500.0000 mg | Freq: Four times a day (QID) | INTRAVENOUS | Status: DC | PRN
  Filled 2012-03-20: qty 5

## 2012-03-20 MED ORDER — NITROGLYCERIN 0.4 MG SL SUBL: 0.4000 mg | SUBLINGUAL_TABLET | SUBLINGUAL | Status: DC | PRN

## 2012-03-20 MED ORDER — KCL IN DEXTROSE-NACL 20-5-0.9 MEQ/L-%-% IV SOLN
INTRAVENOUS | Status: DC
  Filled 2012-03-20 (×9): qty 1000

## 2012-03-20 MED ORDER — HYDROMORPHONE 0.3 MG/ML IV SOLN
INTRAVENOUS | Status: AC
  Filled 2012-03-20: qty 25

## 2012-03-20 MED ORDER — PHENOL 1.4 % MT LIQD: 1.0000 | OROMUCOSAL | Status: DC | PRN

## 2012-03-20 MED ORDER — OXYCODONE-ACETAMINOPHEN 5-325 MG PO TABS: 1.0000 | ORAL_TABLET | ORAL | Status: DC | PRN

## 2012-03-20 MED ORDER — OMEGA-3-ACID ETHYL ESTERS 1 G PO CAPS
1.0000 g | ORAL_CAPSULE | Freq: Two times a day (BID) | ORAL | Status: DC
  Administered 2012-03-20 – 2012-03-27 (×14): 1 g via ORAL
  Filled 2012-03-20 (×18): qty 1

## 2012-03-20 MED ORDER — DIPHENHYDRAMINE HCL 12.5 MG/5ML PO ELIX: 12.5000 mg | ORAL_SOLUTION | ORAL | Status: DC | PRN

## 2012-03-20 MED ORDER — HYDROMORPHONE HCL PF 1 MG/ML IJ SOLN
INTRAMUSCULAR | Status: AC
  Administered 2012-03-20: 0.5 mg via INTRAVENOUS
  Filled 2012-03-20: qty 1

## 2012-03-20 MED ORDER — ALLOPURINOL 300 MG PO TABS
300.0000 mg | ORAL_TABLET | Freq: Every day | ORAL | Status: DC
  Administered 2012-03-20 – 2012-03-27 (×8): 300 mg via ORAL
  Filled 2012-03-20 (×8): qty 1

## 2012-03-20 MED ORDER — NALOXONE HCL 0.4 MG/ML IJ SOLN: 0.4000 mg | INTRAMUSCULAR | Status: DC | PRN

## 2012-03-20 MED ORDER — CEFAZOLIN SODIUM 1-5 GM-% IV SOLN
1.0000 g | Freq: Four times a day (QID) | INTRAVENOUS | Status: AC
  Administered 2012-03-20 – 2012-03-21 (×3): 1 g via INTRAVENOUS
  Filled 2012-03-20 (×3): qty 50

## 2012-03-20 MED ORDER — FISH OIL MAXIMUM STRENGTH 1200 MG PO CAPS: 1.0000 | ORAL_CAPSULE | Freq: Every day | ORAL | Status: DC

## 2012-03-20 MED ORDER — ONDANSETRON HCL 4 MG/2ML IJ SOLN
INTRAMUSCULAR | Status: DC | PRN
  Administered 2012-03-20: 4 mg via INTRAVENOUS

## 2012-03-20 MED ORDER — ACETAMINOPHEN 325 MG PO TABS
650.0000 mg | ORAL_TABLET | Freq: Four times a day (QID) | ORAL | Status: DC | PRN
  Administered 2012-03-26 – 2012-03-27 (×3): 650 mg via ORAL
  Filled 2012-03-20 (×3): qty 2

## 2012-03-20 MED ORDER — DIAZEPAM 5 MG PO TABS
10.0000 mg | ORAL_TABLET | Freq: Two times a day (BID) | ORAL | Status: DC | PRN
  Administered 2012-03-21 – 2012-03-23 (×3): 10 mg via ORAL
  Filled 2012-03-20 (×3): qty 2

## 2012-03-20 MED ORDER — BUPROPION HCL ER (SR) 100 MG PO TB12
100.0000 mg | ORAL_TABLET | Freq: Two times a day (BID) | ORAL | Status: DC
  Administered 2012-03-20 – 2012-03-27 (×13): 100 mg via ORAL
  Filled 2012-03-20 (×16): qty 1

## 2012-03-20 MED ORDER — HYDROCODONE-ACETAMINOPHEN 7.5-325 MG PO TABS
1.0000 | ORAL_TABLET | ORAL | Status: DC | PRN
  Administered 2012-03-21: 2 via ORAL
  Administered 2012-03-21: 1 via ORAL
  Administered 2012-03-21 – 2012-03-22 (×3): 2 via ORAL
  Filled 2012-03-20 (×5): qty 2

## 2012-03-20 MED ORDER — VECURONIUM BROMIDE 10 MG IV SOLR
INTRAVENOUS | Status: DC | PRN
  Administered 2012-03-20: 6 mg via INTRAVENOUS
  Administered 2012-03-20: 4 mg via INTRAVENOUS

## 2012-03-20 MED ORDER — DIPHENHYDRAMINE HCL 12.5 MG/5ML PO ELIX: 12.5000 mg | ORAL_SOLUTION | Freq: Four times a day (QID) | ORAL | Status: DC | PRN

## 2012-03-20 MED ORDER — SIMVASTATIN 40 MG PO TABS
40.0000 mg | ORAL_TABLET | Freq: Every day | ORAL | Status: DC
  Administered 2012-03-20 – 2012-03-26 (×7): 40 mg via ORAL
  Filled 2012-03-20 (×8): qty 1

## 2012-03-20 MED ORDER — MIDAZOLAM HCL 5 MG/5ML IJ SOLN
INTRAMUSCULAR | Status: DC | PRN
  Administered 2012-03-20: 1 mg via INTRAVENOUS

## 2012-03-20 MED ORDER — FENTANYL CITRATE 0.05 MG/ML IJ SOLN
INTRAMUSCULAR | Status: DC | PRN
  Administered 2012-03-20: 50 ug via INTRAVENOUS
  Administered 2012-03-20 (×2): 100 ug via INTRAVENOUS

## 2012-03-20 MED ORDER — NEOSTIGMINE METHYLSULFATE 1 MG/ML IJ SOLN
INTRAMUSCULAR | Status: DC | PRN
  Administered 2012-03-20: 3 mg via INTRAVENOUS

## 2012-03-20 MED ORDER — ONDANSETRON HCL 4 MG/2ML IJ SOLN
4.0000 mg | Freq: Four times a day (QID) | INTRAMUSCULAR | Status: DC | PRN
  Administered 2012-03-20 (×2): 4 mg via INTRAVENOUS
  Filled 2012-03-20 (×2): qty 2

## 2012-03-20 SURGICAL SUPPLY — 64 items
ADH SKN CLS APL DERMABOND .7 (GAUZE/BANDAGES/DRESSINGS)
APL SKNCLS STERI-STRIP NONHPOA (GAUZE/BANDAGES/DRESSINGS)
BENZOIN TINCTURE PRP APPL 2/3 (GAUZE/BANDAGES/DRESSINGS) ×1 IMPLANT
BLADE SAW SAG 73X25 THK (BLADE) ×1
BLADE SAW SGTL 73X25 THK (BLADE) ×1 IMPLANT
BRUSH FEMORAL CANAL (MISCELLANEOUS) IMPLANT
CLOTH BEACON ORANGE TIMEOUT ST (SAFETY) ×2 IMPLANT
COVER BACK TABLE 24X17X13 BIG (DRAPES) IMPLANT
COVER SURGICAL LIGHT HANDLE (MISCELLANEOUS) ×2 IMPLANT
DERMABOND ADVANCED (GAUZE/BANDAGES/DRESSINGS)
DERMABOND ADVANCED .7 DNX12 (GAUZE/BANDAGES/DRESSINGS) ×1 IMPLANT
DRAPE INCISE IOBAN 66X45 STRL (DRAPES) ×2 IMPLANT
DRAPE ORTHO SPLIT 77X108 STRL (DRAPES) ×4
DRAPE SURG ORHT 6 SPLT 77X108 (DRAPES) ×2 IMPLANT
DRAPE U-SHAPE 47X51 STRL (DRAPES) ×2 IMPLANT
DRILL BIT 7/64X5 (BIT) ×2 IMPLANT
DRSG MEPILEX BORDER 4X4 (GAUZE/BANDAGES/DRESSINGS) ×1 IMPLANT
DRSG MEPILEX BORDER 4X8 (GAUZE/BANDAGES/DRESSINGS) ×1 IMPLANT
DURAPREP 26ML APPLICATOR (WOUND CARE) ×2 IMPLANT
ELECT BLADE 6.5 EXT (BLADE) ×1 IMPLANT
ELECT REM PT RETURN 9FT ADLT (ELECTROSURGICAL) ×2
ELECTRODE REM PT RTRN 9FT ADLT (ELECTROSURGICAL) ×1 IMPLANT
EVACUATOR 1/8 PVC DRAIN (DRAIN) ×1 IMPLANT
FACESHIELD LNG OPTICON STERILE (SAFETY) ×4 IMPLANT
FILTER STRAW FLUID ASPIR (MISCELLANEOUS) ×1 IMPLANT
GLOVE BIOGEL PI IND STRL 7.5 (GLOVE) ×1 IMPLANT
GLOVE BIOGEL PI INDICATOR 7.5 (GLOVE) ×1
GLOVE ECLIPSE 7.0 STRL STRAW (GLOVE) ×2 IMPLANT
GLOVE ECLIPSE 8.5 STRL (GLOVE) ×2 IMPLANT
GLOVE SURG 8.5 LATEX PF (GLOVE) ×2 IMPLANT
GOWN PREVENTION PLUS LG XLONG (DISPOSABLE) IMPLANT
GOWN PREVENTION PLUS XXLARGE (GOWN DISPOSABLE) ×3 IMPLANT
GOWN STRL NON-REIN LRG LVL3 (GOWN DISPOSABLE) ×4 IMPLANT
HANDPIECE INTERPULSE COAX TIP (DISPOSABLE)
IMMOBILIZER KNEE 20 (SOFTGOODS)
IMMOBILIZER KNEE 20 THIGH 36 (SOFTGOODS) ×1 IMPLANT
KIT BASIN OR (CUSTOM PROCEDURE TRAY) ×2 IMPLANT
KIT ROOM TURNOVER OR (KITS) ×2 IMPLANT
MANIFOLD NEPTUNE II (INSTRUMENTS) ×2 IMPLANT
NEEDLE 22X1 1/2 (OR ONLY) (NEEDLE) ×2 IMPLANT
NS IRRIG 1000ML POUR BTL (IV SOLUTION) ×2 IMPLANT
PACK TOTAL JOINT (CUSTOM PROCEDURE TRAY) ×2 IMPLANT
PAD ARMBOARD 7.5X6 YLW CONV (MISCELLANEOUS) ×4 IMPLANT
PASSER SUT SWANSON 36MM LOOP (INSTRUMENTS) ×2 IMPLANT
PRESSURIZER FEMORAL UNIV (MISCELLANEOUS) IMPLANT
SCREW PINN CAN 6.5X20 (Screw) ×1 IMPLANT
SET HNDPC FAN SPRY TIP SCT (DISPOSABLE) IMPLANT
SPONGE LAP 4X18 X RAY DECT (DISPOSABLE) ×1 IMPLANT
STRIP CLOSURE SKIN 1/2X4 (GAUZE/BANDAGES/DRESSINGS) ×2 IMPLANT
SUCTION FRAZIER TIP 10 FR DISP (SUCTIONS) ×2 IMPLANT
SUT ETHIBOND NAB CT1 #1 30IN (SUTURE) ×10 IMPLANT
SUT VIC AB 0 CT1 27 (SUTURE) ×4
SUT VIC AB 0 CT1 27XBRD ANBCTR (SUTURE) ×2 IMPLANT
SUT VIC AB 1 CT1 27 (SUTURE) ×4
SUT VIC AB 1 CT1 27XBRD ANBCTR (SUTURE) ×2 IMPLANT
SUT VIC AB 2-0 CT1 27 (SUTURE) ×4
SUT VIC AB 2-0 CT1 TAPERPNT 27 (SUTURE) ×2 IMPLANT
SUT VICRYL 4-0 PS2 18IN ABS (SUTURE) ×2 IMPLANT
SYR CONTROL 10ML LL (SYRINGE) ×2 IMPLANT
TOWEL OR 17X24 6PK STRL BLUE (TOWEL DISPOSABLE) ×2 IMPLANT
TOWEL OR 17X26 10 PK STRL BLUE (TOWEL DISPOSABLE) ×2 IMPLANT
TOWER CARTRIDGE SMART MIX (DISPOSABLE) IMPLANT
TRAY FOLEY CATH 14FR (SET/KITS/TRAYS/PACK) ×2 IMPLANT
WATER STERILE IRR 1000ML POUR (IV SOLUTION) ×8 IMPLANT

## 2012-03-20 NOTE — Preoperative (Signed)
Beta Blockers   Reason not to administer Beta Blockers:Not Applicable 

## 2012-03-20 NOTE — Anesthesia Postprocedure Evaluation (Signed)
  Anesthesia Post-op Note  Patient: Derek [name]  Procedure(s) Performed: Procedure(s) (LRB): TOTAL HIP ARTHROPLASTY (Left) HARDWARE REMOVAL (Left)  Patient Location: PACU  Anesthesia Type: General  Level of Consciousness: awake and alert   Airway and Oxygen Therapy: Patient Spontanous Breathing  Post-op Pain: mild  Post-op Assessment: Post-op Vital signs reviewed, Patient's Cardiovascular Status Stable, Respiratory Function Stable, Patent Airway and No signs of Nausea or vomiting  Post-op Vital Signs: stable  Complications: No apparent anesthesia complications

## 2012-03-20 NOTE — Transfer of Care (Signed)
Immediate Anesthesia Transfer of Care Note  Patient: Derek [name]  Procedure(s) Performed: Procedure(s) (LRB): TOTAL HIP ARTHROPLASTY (Left) HARDWARE REMOVAL (Left)  Patient Location: PACU  Anesthesia Type: General  Level of Consciousness: awake, alert  and oriented  Airway & Oxygen Therapy: Patient Spontanous Breathing and Patient connected to nasal cannula oxygen  Post-op Assessment: Report given to PACU RN and Post -op Vital signs reviewed and stable  Post vital signs: Reviewed and stable  Complications: No apparent anesthesia complications

## 2012-03-20 NOTE — H&P (Signed)
The patient has been re-examined, and the chart reviewed, and there have been no interval changes to the documented history and physical.    The risks, benefits, and alternatives have been discussed at length, and the patient is willing to proceed.   The patient has been re-examined, and the chart reviewed, and there have been no interval changes to the documented history and physical.    The risks, benefits, and alternatives have been discussed at length, and the patient is willing to proceed.

## 2012-03-20 NOTE — Op Note (Addendum)
03/20/2012  10:08 AM  OPERATIVE REPORT   PATIENT:  Derek [name]  66 y.o. male  MRN: [id number]  PRE-OPERATIVE DIAGNOSIS:  Left hip osteoarthritis, retained hardware, 3 knowles pins left hip.  POST-OPERATIVE DIAGNOSIS:  left hip osteoarthritis, retained hardware, 3 knowles pins left hip   PROCEDURE:  Procedure(s): TOTAL HIP ARTHROPLASTY HARDWARE REMOVAL    SURGEON: Jessy Oto, MD    ASSISTANT: Phillips Hay, PA-C  (Present throughout the entire procedure and necessary for completion of procedure in a timely manner)     ANESTHESIA:  General,   Dr. Kalman Shan    COMPLICATIONS:  None.  EBL: 300 cc  CELL SAVER BLOOD: None returned.    COMPONENTS:  DePuy [place] press fit 7-mm femoral component, a 36 -mm  outer diameter hip ball with a +5 mm neck length, 56-mm outer  diameter Pinnacle acetabulum, and a polyethylene liner  10-degree  posterior lip.  Components were Press-Fit.  DRAINS: Foley to straight drain. The Hemovac x1 left hip.  SPECIMEN: Femoral head was retained distal portion of the broken Knowles pins sent to pathology. The proximal portions of the Knowles pins were cleaned and given to the patient.   PROCEDURE IN DETAIL: The patient was met in the holding area and  identified. The appropriate left hip was identified and marked at the operative site. Preoperative antibiotics 2 g Ancef were given . The patient was then transported to the OR and  placed under general l anesthesia. At that point, the patient was  placed in the right lateral decubitus position with the operative left side up and  secured to the operating room table with the Innomed hip system.  The operative lower extremity was prepped from the iliac crest to the distal  leg with Betadine scrub and then DuraPrep. Sterile draping was  performed.  A routine southern incision 15 cm in length was utilized and via sharp dissection  carried down to the subcutaneous tissue. Gross bleeders were Bovie    coagulated. The iliotibial band was quickly identified and incised  along the length of the skin incision. Self-retaining retractors were  inserted. The 3 Knowles pins identified over the distal lateral flare of the trochanter. These were each individually removed using a tonsil clamp easily as they were broken.  With the hip internally rotated, the short external rotators  were identified. Piriformis tendon divided and tagged. Tendinous structures were tagged with 0 Ethibond  suture. The hip capsule was identified and incised along the femoral  neck and head. There was a moderate clear yellow joint effusion. Hip was  dislocated posteriorly. It was misshapen and subchondral fracture with cartilage  flap. Several loose bodies were identified within the acetabulum and these were removed.  Large anterior and inferior posterior osteophytes were present. There was also a large medial osteophyte.  The femoral neck was then osteotomized using a  calcar guide and removed from the wound. Labral resection was performed  from the acetabulum.  The femoral neck osteotomy was placed about 5 mm proximal to the lesser trochanter. Examining the cut surface of the head and neck residuals pins were found within the specimen. A starter hole was then made through the piriformis fossa. Canal finder was utilized then lateralizing reamer. Reaming was  performed to the appropriate size #8. I had nice endosteal purchase. Rasping was performed sequentially to the  #7 uncemented rasp.  Retractors were then placed about the acetabulum. Further capsule section  was performed. There was a large degenerative  labrum that was also  excised. Retractor was placed about the acetabulum. It was somewhat  misshapen because of the superior migration of the femoral head.  Reaming was performed sequentially to the 56mm size.  It had very nice bleeding circumferentially and a nice strong thick  acetabulum. I then trialed the 56 mm  acetabular component. It had  complete seating. Accordingly, the trial acetabular component was  impacted into the acetabulum. It was a very nice fit. It was nice and  stable.  The trial 10 polyethylene liner was inserted followed by the femoral rasp. We trialed a number of neck lengths and felt like trial +5 mm neck and 36 mm ball was the most stable. Large anterior osteophytes were then resected as well as posterior inferior osteophytes using 1/2 inch curved osteotome, Leksell rongeur and electrocautery. At that point, there was minimal toggling and complete stability. Leg lengths were appropriate.  The trial components were then removed. The joint was copiously  irrigated with saline solution. A permanent Pinnacle porous-coated 56 mm acetabular implant was then impacted into place observed to be fully seated in approximately 30 of abduction and 20 anteversion. Fixation screws were placed one in the superior mid portion of the cup within the iliac wing. A second screw placed superior and posterior. Both obtained excellent purchase. Apex hole eliminator was inserted into  the acetabular component followed by the final Marathon 10 polyethylene liner.  The femoral component was then impacted onto the calcar. Wound was again irrigated.  The trial head was then removed. We cleaned the [facility] taper neck and  inserted the final head. This was reduced, and through a full range of motion, it was perfectly stable and there was no subluxation. Flexion in neutral abduction adduction was over 110 degrees. His internal rotation at 90 was 60. Minimal motion with shuck testing. Knee easily flexed with a hip in full extension. There was no evidence of  instability. It had a very nice construct.  Wound was then irrigated with saline solution. The capsule was closed  anatomically with #1 Ethibond. The short external rotators were closed  with similar material through drill hole to the greater trochanter using a  suture passer to pass the suture. The wound was again irrigated with saline  solution. The iliotibial band was closed with reluctant #1 Ethibond, subcu  was closed with #1 Vicryl and 0 Vicryl, subcutaneous layers were reapproximated with interrupted 0 and 2-0 Vicryl sutures.Skin was closed with skin  clips. All instruments and sponge counts were correct.  Mepilex dressing was applied and held in place with Hypafix tape. Knee immobilizer was placed. The patient was then placed in the supine position,  awoken, placed on the operating stretcher, and returned to the  postanesthesia recovery room in satisfactory condition.   Gabriellah Rabel E  03/20/2012 10:08 AM

## 2012-03-20 NOTE — Plan of Care (Signed)
Problem: Consults Goal: Diagnosis- Total Joint Replacement Primary Total Hip     

## 2012-03-20 NOTE — Progress Notes (Signed)
Orthopedic Tech Progress Note Patient Details:  Derek [name] [birth year] [id number]  Patient ID: Derek [name], male   DOB: 1946-06-18, 66 y.o.   MRN: [id number]   Derek [name] 03/20/2012, 4:27 PM Trapeze bar

## 2012-03-20 NOTE — Brief Op Note (Signed)
03/20/2012  10:03 AM  PATIENT:  Derek [name]  66 y.o. male  PRE-OPERATIVE DIAGNOSIS:  Left hip osteoarthritis, retained hardware 3 broken knowles pins  POST-OPERATIVE DIAGNOSIS:  left hip osteoarthritis, retained hardware 3 knowles pins.   PROCEDURE:  Procedure(s) (LRB): TOTAL HIP ARTHROPLASTY (Left) HARDWARE REMOVAL (Left)hip 3 broken knowles pins.  SURGEON:  Surgeon(s) and Role:    * Jessy Oto, MD - Primary  PHYSICIAN ASSISTANT: Phillips Hay PA-C  ANESTHESIA:   general, Dr. Kalman Shan.  EBL:  Total I/O In: 1000 [I.V.:1000] Out: 100 [Urine:100],300cc EBL  BLOOD ADMINISTERED:none and 0 CC CELLSAVER  DRAINS: (one) Hemovact drain(s) in the left hip with  Suction Open   LOCAL MEDICATIONS USED:  NONE  SPECIMEN:  Source of Specimen:  left hip  DISPOSITION OF SPECIMEN:  PATHOLOGY  COUNTS:  YES  TOURNIQUET:  * No tourniquets in log *  DICTATION: .Dragon Dictation  PLAN OF CARE: Admit to inpatient   PATIENT DISPOSITION:  PACU - hemodynamically stable.   Delay start of Pharmacological VTE agent (>24hrs) due to surgical blood loss or risk of bleeding: no

## 2012-03-20 NOTE — Progress Notes (Signed)
UR COMPLETED  

## 2012-03-20 NOTE — Anesthesia Preprocedure Evaluation (Addendum)
Anesthesia Evaluation  Patient identified by MRN, date of birth, ID band Patient awake    Reviewed: Allergy & Precautions, H&P , NPO status , Patient's Chart, lab work & pertinent test results  Airway Mallampati: II TM Distance: <3 FB Neck ROM: Full    Dental No notable dental hx. (+) Partial Lower and Partial Upper   Pulmonary neg pulmonary ROS, former smoker breath sounds clear to auscultation  Pulmonary exam normal       Cardiovascular hypertension, Pt. on home beta blockers - angina+ CAD, + Past MI and + Cardiac Stents negative cardio ROS  Rhythm:Regular Rate:Normal     Neuro/Psych Anxiety negative neurological ROS  negative psych ROS   GI/Hepatic negative GI ROS, Neg liver ROS, GERD-  Controlled,  Endo/Other  negative endocrine ROS  Renal/GU negative Renal ROS  negative genitourinary   Musculoskeletal negative musculoskeletal ROS (+)   Abdominal   Peds negative pediatric ROS (+)  Hematology negative hematology ROS (+)   Anesthesia Other Findings   Reproductive/Obstetrics negative OB ROS                         Anesthesia Physical Anesthesia Plan  ASA: III  Anesthesia Plan: General   Post-op Pain Management:    Induction: Intravenous  Airway Management Planned: Oral ETT  Additional Equipment:   Intra-op Plan:   Post-operative Plan: Extubation in OR and Possible Post-op intubation/ventilation  Informed Consent: I have reviewed the patients History and Physical, chart, labs and discussed the procedure including the risks, benefits and alternatives for the proposed anesthesia with the patient or authorized representative who has indicated his/her understanding and acceptance.   Dental advisory given  Plan Discussed with: CRNA  Anesthesia Plan Comments:         Anesthesia Quick Evaluation

## 2012-03-21 ENCOUNTER — Encounter (HOSPITAL_COMMUNITY): Payer: Self-pay | Admitting: Specialist

## 2012-03-21 LAB — BASIC METABOLIC PANEL
BUN: 25 mg/dL — ABNORMAL HIGH (ref 6–23)
CO2: 28 mEq/L (ref 19–32)
Calcium: 8.5 mg/dL (ref 8.4–10.5)
Chloride: 106 mEq/L (ref 96–112)
Creatinine, Ser: 1.8 mg/dL — ABNORMAL HIGH (ref 0.50–1.35)
GFR calc Af Amer: 44 mL/min — ABNORMAL LOW (ref >90)
GFR calc non Af Amer: 38 mL/min — ABNORMAL LOW (ref >90)
Glucose, Bld: 142 mg/dL — ABNORMAL HIGH (ref 70–99)
Potassium: 4.5 mEq/L (ref 3.5–5.1)
Sodium: 141 mEq/L (ref 135–145)

## 2012-03-21 LAB — CBC
HCT: 35.4 % — ABNORMAL LOW (ref 39.0–52.0)
Hemoglobin: 11.4 g/dL — ABNORMAL LOW (ref 13.0–17.0)
MCH: 28.2 pg (ref 26.0–34.0)
MCHC: 32.2 g/dL (ref 30.0–36.0)
MCV: 87.6 fL (ref 78.0–100.0)
Platelets: 95 10*3/uL — ABNORMAL LOW (ref 150–400)
RBC: 4.04 MIL/uL — ABNORMAL LOW (ref 4.22–5.81)
RDW: 15.2 % (ref 11.5–15.5)
WBC: 7.7 10*3/uL (ref 4.0–10.5)

## 2012-03-21 NOTE — Progress Notes (Signed)
Physical Therapy Treatment Note   03/21/12 1421  PT Visit Information  Last PT Received On 03/21/12  Precautions  Precautions Posterior Hip  Precaution Comments able to recall 2/3, min v/c's to recall 3rd (bending <90)  Restrictions  LLE Weight Bearing WBAT  Bed Mobility  Sit to Supine 2: Max assist  Sit to Supine - Details (indicate cue type and reason) maxA for LE managment, max v/c's for sequencing to adhere to post hip prec  Transfers  Sit to Stand 2: Max assist  Sit to Stand Details (indicate cue type and reason) pt with increased fatigue requiring increased assist this date from transfering off BSC and out of chair.  Ambulation/Gait  Ambulation/Gait Assistance 4: Min assist  Ambulation/Gait Assistance Details (indicate cue type and reason) max encouragement required  Ambulation Distance (Feet) 10 Feet  Assistive device Rolling walker  Gait Pattern WFL;Decreased step length - left;Decreased stance time - left  Gait velocity significant increase in time required due to increased pain  Stairs No  Wheelchair Mobility  Wheelchair Mobility No  PT - End of Session  Equipment Utilized During Treatment Gait belt  Activity Tolerance Patient limited by pain  Patient left in bed;with call bell in reach;with family/visitor present  Nurse Communication Mobility status for transfers;Mobility status for ambulation  General  Behavior During Session [facility] for tasks performed  Cognition [facility] for tasks performed  PT - Assessment/Plan  Comments on Treatment Session Patient with increased pain this date and required increased assist this date and had decreased ambulation tolerance. Patient unable to tolerate ther ex this date.  PT Plan Discharge plan remains appropriate;Frequency remains appropriate  PT Frequency 7X/week  Follow Up Recommendations Home health PT;Supervision/Assistance - 24 hour  Equipment Recommended Rolling walker with 5" wheels;3 in 1 bedside comode;Tub/shower bench  Acute  Rehab PT Goals  PT Goal: Sit to Supine/Side - Progress Progressing toward goal  PT Goal: Sit to Stand - Progress Progressing toward goal  PT Goal: Ambulate - Progress Progressing toward goal  PT Goal: Perform Home Exercise Program - Progress Progressing toward goal  Additional Goals  PT Goal: Additional Goal #1 - Progress Progressing toward goal    Pain: 8/10 L hip pain  Kittie Plater, PT, DPT Pager #: [phone] Office #: [phone]

## 2012-03-21 NOTE — Progress Notes (Signed)
Clinical Social Worker received consult for "SNF." Currently, PT is recommending home health PT. RNCM is aware and following for discharge needs. CSW is currently signing off as no social work discharge needs identified. Please reconsult if a need arises prior to discharge.   Darden Dates, MSW, [place]

## 2012-03-21 NOTE — Progress Notes (Signed)
Physical Therapy Evaluation Note  Past Medical History  Diagnosis Date  . Chronic idiopathic thrombocytopenia   . History of intermittent claudication   . HLD (hyperlipidemia)     mixed  . Coronary atherosclerosis of native coronary vessel   . Edema   . Myocardial infarction   . Angina     QUESTIONABLE  PULLING  BEHIND RIBS LEFT SIDE X 5 MONTHS   . Hypertension   . Peripheral vascular disease   . Chronic renal insufficiency     1 KIDNEY   . GERD (gastroesophageal reflux disease)   . Arthritis   . Depression   . Anxiety    Past Surgical History  Procedure Date  . Hernia repair   . Total hip arthroplasty   . Tonsillectomy   . Cardiac catheterization 2010    patent stent and nonobsturctive cad following an abnormal cardiolite study may of 2010 with an apical lateral defect  . Coronary angioplasty   . No past surgeries     1962 BROKE LT HIP WAS PINNED     03/21/12 1059  PT Visit Information  Last PT Received On 03/21/12  Precautions  Precautions Posterior Hip  Precaution Booklet Issued Yes (comment)  Restrictions  LLE Weight Bearing WBAT  Home Living  Lives With Spouse  Type of Calcium Two level;Able to live on main level with bedroom/bathroom  Home Access Stairs to enter  Entrance Stairs-Rails None  Entrance Stairs-Number of Steps 2 (1 4in step up to deck and 1 4 in step into house)  Bathroom Shower/Tub Tub/shower unit;Curtain  Corporate treasurer Yes  How Accessible Accessible via walker  Home Adaptive Equipment Straight cane;Crutches  Additional Comments pt reports "my wife is taking care of all that" Will talk to wife this PM regarding DME.  Prior Function  Level of Independence Independent with basic ADLs;Independent with gait;Independent with transfers  Able to Take Stairs? Yes  Driving Yes  Vocation Retired  Production manager  Overall Cognitive Status Appears within functional limits  for tasks assessed  Orientation Level Oriented X4  Sensation  Light Touch Appears Intact  Bed Mobility  Bed Mobility Yes  Supine to Sit 3: Mod assist;With rails;HOB flat  Supine to Sit Details (indicate cue type and reason) assist at trunk and L LE management, max directional verbal cues for technique  Transfers  Transfers Yes  Sit to Stand 3: Mod assist;With upper extremity assist;From bed  Sit to Stand Details (indicate cue type and reason) max directional verbal cues for sequencing  Ambulation/Gait  Ambulation/Gait Yes  Ambulation/Gait Assistance 4: Min assist  Ambulation/Gait Assistance Details (indicate cue type and reason) max directional verbal cues for sequencing, improved as ambulation progressed  Ambulation Distance (Feet) 15 Feet  Assistive device Rolling walker  Gait Pattern Step-to pattern;Decreased step length - left;Decreased stance time - left  Gait velocity decreased  Stairs No  Wheelchair Mobility  Wheelchair Mobility No  Posture/Postural Control  Posture/Postural Control No significant limitations  Balance  Balance Assessed No  RUE Assessment  RUE Assessment WFL  LUE Assessment  LUE Assessment WFL  RLE Assessment  RLE Assessment WFL  LLE Assessment  LLE Assessment X (knee/ankle WFL, hip with minimal volitionary mvmt)  Cervical Assessment  Cervical Assessment [facility]  Thoracic Assessment  Thoracic Assessment WFL  Lumbar Assessment  Lumbar Assessment WFL  PT - End of Session  Equipment Utilized During Treatment Gait belt  Activity Tolerance Patient limited by pain  Patient left in chair;with call bell in reach;with family/visitor present  Nurse Communication Mobility status for transfers;Mobility status for ambulation  General  Behavior During Session [facility] for tasks performed  Cognition [facility] for tasks performed  PT Assessment  Clinical Impression Statement Patient s/p L THA presenting with post hip prec, L LE WBAT, L LE decreased strenght and L LE  decreased ROM. Patient I PTA but currently needs increased assist for all mobiltiy due to increased L LE pain.   PT Recommendation/Assessment Patient will need skilled PT in the acute care venue  PT Problem List Decreased strength;Decreased range of motion;Decreased activity tolerance;Decreased balance;Decreased mobility  PT Therapy Diagnosis  Difficulty walking;Abnormality of gait;Generalized weakness;Acute pain  PT Plan  PT Frequency 7X/week  PT Treatment/Interventions DME instruction;Gait training;Functional mobility training;Stair training;Therapeutic activities;Therapeutic exercise  PT Recommendation  Follow Up Recommendations Home health PT;Supervision/Assistance - 24 hour  Recommended DME: will discuss with wife this PM to see what DME they have available since patient was unaware. Pt will most likely require RW, 3-n-1 commode and tub bench.  Consulted and Agree with Results and Recommendations Patient  Acute Rehab PT Goals  PT Goal Formulation With patient  Time For Goal Achievement 7 days  Pt will go Supine/Side to Sit with modified independence;with HOB 0 degrees  PT Goal: Supine/Side to Sit - Progress Goal set today  Pt will go Sit to Supine/Side with supervision;with HOB 0 degrees  PT Goal: Sit to Supine/Side - Progress Goal set today  Pt will go Sit to Stand with modified independence  PT Goal: Sit to Stand - Progress Goal set today  Pt will Ambulate >150 feet;with modified independence;with rolling walker  PT Goal: Ambulate - Progress Goal set today  Pt will Go Up / Down Stairs 1-2 stairs;with supervision;with rolling walker  PT Goal: Up/Down Stairs - Progress Goal set today  Pt will Perform Home Exercise Program Independently  PT Goal: Perform Home Exercise Program - Progress Goal set today  Additional Goals  Additional Goal #1 Pt with be 100% compliant with L post hip precautions.  PT Goal: Additional Goal #1 - Progress Goal set today    Pain: 6/10 L hip pain, pt hit  pain button during PT eval  Kittie Plater, PT, DPT Pager #: [phone] Office #: [phone]

## 2012-03-21 NOTE — Evaluation (Signed)
Occupational Therapy Evaluation Patient Details Name: Derek [name] MRN: [id number] DOB: [birth year] Today's Date: 03/21/2012  Problem List:  Patient Active Problem List  Diagnoses  . HYPERLIPIDEMIA-MIXED  . ANXIETY DEPRESSION  . CAD, NATIVE VESSEL  . EDEMA  . Complete rupture of rotator cuff  . Pain in joint, shoulder region  . Muscle weakness (generalized)  . Post-traumatic osteoarthritis of left hip    Past Medical History:  Past Medical History  Diagnosis Date  . Chronic idiopathic thrombocytopenia   . History of intermittent claudication   . HLD (hyperlipidemia)     mixed  . Coronary atherosclerosis of native coronary vessel   . Edema   . Myocardial infarction   . Angina     QUESTIONABLE  PULLING  BEHIND RIBS LEFT SIDE X 5 MONTHS   . Hypertension   . Peripheral vascular disease   . Chronic renal insufficiency     1 KIDNEY   . GERD (gastroesophageal reflux disease)   . Arthritis   . Depression   . Anxiety    Past Surgical History:  Past Surgical History  Procedure Date  . Hernia repair   . Total hip arthroplasty   . Tonsillectomy   . Cardiac catheterization 2010    patent stent and nonobsturctive cad following an abnormal cardiolite study may of 2010 with an apical lateral defect  . Coronary angioplasty   . No past surgeries     1962 BROKE LT HIP WAS PINNED  . Total hip arthroplasty 03/20/2012    Procedure: TOTAL HIP ARTHROPLASTY;  Surgeon: Jessy Oto, MD;  Location: [place];  Service: Orthopedics;  Laterality: Left;  Left total hip replacement, ceramic on poly  . Hardware removal 03/20/2012    Procedure: HARDWARE REMOVAL;  Surgeon: Jessy Oto, MD;  Location: [place];  Service: Orthopedics;  Laterality: Left;  Hardware removal 3 knowles pins left hip    OT Assessment/Plan/Recommendation OT Assessment Clinical Impression Statement: Pt. presents with left THA and with increased pain. Pt. will benefit from skilled OT to increase functional indpendence and get  pt. to supervision level to decrease burden of care at D/C home. OT Recommendation/Assessment: Patient will need skilled OT in the acute care venue OT Problem List: Decreased activity tolerance;Impaired balance (sitting and/or standing);Decreased knowledge of use of DME or AE;Decreased safety awareness;Decreased knowledge of precautions;Pain Barriers to Discharge: None OT Therapy Diagnosis : Acute pain OT Plan OT Frequency: Min 2X/week OT Treatment/Interventions: Self-care/ADL training;DME and/or AE instruction;Energy conservation;Therapeutic activities;Patient/family education;Balance training OT Recommendation Follow Up Recommendations: Home health OT;Supervision - Intermittent Equipment Recommended: Tub/shower bench Individuals Consulted Consulted and Agree with Results and Recommendations: Patient OT Goals Acute Rehab OT Goals OT Goal Formulation: With patient Time For Goal Achievement: 7 days ADL Goals Pt Will Perform Grooming: with set-up;with supervision;Standing at sink ADL Goal: Grooming - Progress: Goal set today Pt Will Perform Lower Body Bathing: with set-up;Sit to stand from chair;with adaptive equipment;with supervision ADL Goal: Lower Body Bathing - Progress: Goal set today Pt Will Perform Lower Body Dressing: with set-up;with supervision;with adaptive equipment;Sit to stand from chair ADL Goal: Lower Body Dressing - Progress: Goal set today Pt Will Transfer to Toilet: with supervision;with DME;Ambulation;3-in-1 ADL Goal: Toilet Transfer - Progress: Goal set today Pt Will Perform Tub/Shower Transfer: with supervision;Ambulation;with DME;Tub transfer;Transfer tub bench ADL Goal: Tub/Shower Transfer - Progress: Goal set today Additional ADL Goal #1: Pt. will recall 3 hip precautions ADL Goal: Additional Goal #1 - Progress: Goal set today  OT Evaluation  Precautions/Restrictions  Precautions Precautions: Posterior Hip Precaution Booklet Issued: Yes  (comment) Restrictions Weight Bearing Restrictions: Yes LLE Weight Bearing: Weight bearing as tolerated Prior Functioning Home Living Lives With: Spouse Type of Home: House Home Layout: Two level;Able to live on main level with bedroom/bathroom Home Access: Stairs to enter Entrance Stairs-Rails: None Entrance Stairs-Number of Steps: 2 Bathroom Shower/Tub: Product/process development scientist: Standard Bathroom Accessibility: Yes How Accessible: Accessible via walker Home Adaptive Equipment: Straight cane;Crutches Additional Comments: pt reports "my wife is taking care of all that" Will talk to wife this PM regarding DME. Prior Function Level of Independence: Independent with basic ADLs;Independent with gait;Independent with transfers Able to Take Stairs?: Yes Driving: Yes Vocation: Retired ADL ADL Eating/Feeding: Simulated;Independent Where Assessed - Eating/Feeding: Chair Grooming: Simulated;Wash/dry hands;Wash/dry face;Minimal assistance Where Assessed - Grooming: Standing at sink Upper Body Bathing: Simulated;Chest;Right arm;Left arm;Abdomen;Set up Where Assessed - Upper Body Bathing: Sitting, chair Lower Body Bathing: Simulated;Maximal assistance Where Assessed - Lower Body Bathing: Sit to stand from chair Upper Body Dressing: Performed;Minimal assistance Upper Body Dressing Details (indicate cue type and reason): With donning gown Where Assessed - Upper Body Dressing: Sitting, chair Lower Body Dressing: Simulated;Maximal assistance Where Assessed - Lower Body Dressing: Sit to stand from chair Toilet Transfer: Simulated;Moderate assistance Toilet Transfer Method:  Building services engineer) Science writer: Other (comment) Building services engineer) Toileting - Clothing Manipulation: Simulated;Minimal assistance Where Assessed - Toileting Clothing Manipulation: Sit on 3-in-1 or toilet Toileting - Hygiene: Simulated Where Assessed - Toileting Hygiene: Sit to stand from 3-in-1 or  toilet Tub/Shower Transfer: Not assessed Equipment Used: Rolling walker Ambulation Related to ADLs: Pt. mod-min assist ~10' with RW ADL Comments: Pt. educated on techniques for completing LB ADLs and maintaining hip precautions.    Cognition Cognition Arousal/Alertness: Awake/alert Overall Cognitive Status: Appears within functional limits for tasks assessed Orientation Level: Oriented X4 Cognition - Other Comments: Pt. with initial decreased following commands due to pain medicine Sensation/Coordination Sensation Light Touch: Appears Intact Extremity Assessment RUE Assessment RUE Assessment: Within Functional Limits LUE Assessment LUE Assessment: Within Functional Limits Mobility  Bed Mobility Bed Mobility: Yes Supine to Sit: 3: Mod assist;With rails;HOB flat Supine to Sit Details (indicate cue type and reason): assist to elevate trunk off bed and for hand placement for technique Transfers Transfers: Yes Sit to Stand: 3: Mod assist;With upper extremity assist;From bed Sit to Stand Details (indicate cue type and reason): Mod verbal cues for hand placement and technique    End of Session OT - End of Session Equipment Utilized During Treatment: Gait belt Activity Tolerance: Patient tolerated treatment well Patient left: in chair;with call bell in reach General Behavior During Session: [facility] for tasks performed Cognition: [facility] for tasks performed  Co-evaluation with P.T.   Zosia Lucchese 03/21/2012, 2:45 PM

## 2012-03-21 NOTE — Progress Notes (Signed)
Subjective: 1 Day Post-Op Procedure(s) (LRB): TOTAL HIP ARTHROPLASTY (Left) HARDWARE REMOVAL (Left) awake, alert and Oriented x 4. PCA not being used desires po pain meds  Patient reports pain as moderate.    Objective:   VITALS:  Temp:  [97.2 F (36.2 C)-98.1 F (36.7 C)] 97.9 F (36.6 C) (04/09 0603) Pulse Rate:  [50-82] 77  (04/09 1016) Resp:  [11-20] 20  (04/09 0603) BP: (117-158)/(62-94) 124/71 mmHg (04/09 1016) SpO2:  [89 %-100 %] 97 % (04/09 0603) Weight:  [91.2 kg (201 lb 1 oz)] 91.2 kg (201 lb 1 oz) (04/09 0900)  Neurologically intact ABD soft Neurovascular intact Sensation intact distally Intact pulses distally Dorsiflexion/Plantar flexion intact Hemovac discontinued  LABS  Basename 03/21/12 0530  HGB 11.4*  WBC 7.7  PLT 95*    Basename 03/21/12 0530  NA 141  K 4.5  CL 106  CO2 28  BUN 25*  CREATININE 1.80*  GLUCOSE 142*   No results found for this basename: LABPT:2,INR:2 in the last 72 hours   Assessment/Plan: 1 Day Post-Op Procedure(s) (LRB): TOTAL HIP ARTHROPLASTY (Left) HARDWARE REMOVAL (Left)  Advance diet Up with therapy D/C knee immobilizer Hemovac out and foley out.  Kamori Kitchens E 03/21/2012, 10:58 AM

## 2012-03-22 ENCOUNTER — Inpatient Hospital Stay (HOSPITAL_COMMUNITY): Payer: Medicare Other

## 2012-03-22 LAB — BASIC METABOLIC PANEL
BUN: 28 mg/dL — ABNORMAL HIGH (ref 6–23)
CO2: 29 mEq/L (ref 19–32)
Calcium: 8.5 mg/dL (ref 8.4–10.5)
Chloride: 100 mEq/L (ref 96–112)
Creatinine, Ser: 2.03 mg/dL — ABNORMAL HIGH (ref 0.50–1.35)
GFR calc Af Amer: 38 mL/min — ABNORMAL LOW (ref >90)
GFR calc non Af Amer: 32 mL/min — ABNORMAL LOW (ref >90)
Glucose, Bld: 113 mg/dL — ABNORMAL HIGH (ref 70–99)
Potassium: 3.5 mEq/L (ref 3.5–5.1)
Sodium: 138 mEq/L (ref 135–145)

## 2012-03-22 LAB — CBC
HCT: 33.2 % — ABNORMAL LOW (ref 39.0–52.0)
Hemoglobin: 10.7 g/dL — ABNORMAL LOW (ref 13.0–17.0)
MCH: 28.4 pg (ref 26.0–34.0)
MCHC: 32.2 g/dL (ref 30.0–36.0)
MCV: 88.1 fL (ref 78.0–100.0)
Platelets: 93 10*3/uL — ABNORMAL LOW (ref 150–400)
RBC: 3.77 MIL/uL — ABNORMAL LOW (ref 4.22–5.81)
RDW: 15.5 % (ref 11.5–15.5)
WBC: 8.2 10*3/uL (ref 4.0–10.5)

## 2012-03-22 MED ORDER — METOPROLOL TARTRATE 25 MG PO TABS
25.0000 mg | ORAL_TABLET | Freq: Two times a day (BID) | ORAL | Status: DC
  Administered 2012-03-22 – 2012-03-27 (×10): 25 mg via ORAL
  Filled 2012-03-22 (×11): qty 1

## 2012-03-22 MED ORDER — TAMSULOSIN HCL 0.4 MG PO CAPS
0.4000 mg | ORAL_CAPSULE | Freq: Every day | ORAL | Status: DC
  Administered 2012-03-22 – 2012-03-27 (×6): 0.4 mg via ORAL
  Filled 2012-03-22 (×6): qty 1

## 2012-03-22 NOTE — Progress Notes (Signed)
Occupational Therapy Treatment Patient Details Name: Derek [name] MRN: [id number] DOB: 01-10-1946 Today's Date: 03/22/2012  OT Assessment/Plan OT Assessment/Plan Comments on Treatment Session: Pt. progressing very well this session OT Plan: Discharge plan remains appropriate OT Frequency: Min 2X/week Follow Up Recommendations: Home health OT;Supervision - Intermittent Equipment Recommended: Rolling walker with 5" wheels;3 in 1 bedside comode;Tub/shower bench OT Goals Acute Rehab OT Goals OT Goal Formulation: With patient ADL Goals Pt Will Perform Lower Body Bathing: with set-up;Sit to stand from chair;with adaptive equipment;with supervision ADL Goal: Lower Body Bathing - Progress: Met Pt Will Perform Lower Body Dressing: with set-up;with supervision;with adaptive equipment;Sit to stand from chair ADL Goal: Lower Body Dressing - Progress: Met Additional ADL Goal #1: Pt. will recall 3 hip precautions ADL Goal: Additional Goal #1 - Progress: Progressing toward goals  OT Treatment Precautions/Restrictions  Precautions Precautions: Posterior Hip Precaution Comments: pt unable to recall 3/3 precautions.  Reviewed all 3.  At end of session pt able to recall 3/3.  Required Braces or Orthoses: No Restrictions Weight Bearing Restrictions: Yes LLE Weight Bearing: Weight bearing as tolerated   ADL ADL Grooming: Performed;Wash/dry hands;Set up;Supervision/safety Where Assessed - Grooming: Standing at sink Lower Body Bathing: Simulated;Supervision/safety;Set up Lower Body Bathing Details (indicate cue type and reason): With long handled sponge Where Assessed - Lower Body Bathing: Sit to stand from chair Lower Body Dressing: Simulated;Set up;Supervision/safety Lower Body Dressing Details (indicate cue type and reason): With use of reacher and pt. able to return verbalization of technique Where Assessed - Lower Body Dressing: Sit to stand from chair Toilet Transfer:  Simulated;Supervision/safety Toilet Transfer Details (indicate cue type and reason): Min verbal cues for left LE placement Toilet Transfer Method: Ambulating Toilet Transfer Equipment: Raised toilet seat with arms (or 3-in-1 over toilet) Ambulation Related to ADLs: Pt. supervision ~10' with RW ADL Comments: Pt's wife shown tub transfer bench and demonstrated technique of safe transfer. Pt. provided with demonstration of  (AE for LB ADLs to maintain hip precautions) Mobility  Bed Mobility Bed Mobility: No Transfers Sit to Stand:  (Pt. standing with nsg upon arrival) Stand to Sit: 4: Min assist;With armrests;To chair/3-in-1 Stand to Sit Details: cues for hand placement, LLE positioning, & use of UE's to control descent.  (A) to control descent    End of Session OT - End of Session Equipment Utilized During Treatment: Gait belt Activity Tolerance: Patient tolerated treatment well Patient left: in chair;with call bell in reach Nurse Communication: Mobility status for transfers General Behavior During Session: Derek [name] for tasks performed Cognition: Derek [name] for tasks performed  Derek [name], OTR/L Pager [phone]  03/22/2012, 2:23 PM

## 2012-03-22 NOTE — Progress Notes (Signed)
Subjective: 2 Days Post-Op Procedure(s) (LRB): TOTAL HIP ARTHROPLASTY (Left) HARDWARE REMOVAL (Left)Awake, alert, oriented x4. PCA discontinued. Tolerating po pain meds.  Patient reports pain as mild.    Objective:   VITALS:  Temp:  [98 F (36.7 C)-100.1 F (37.8 C)] 98.8 F (37.1 C) (04/10 0538) Pulse Rate:  [73-80] 80  (04/10 0538) Resp:  [16-20] 20  (04/10 0538) BP: (99-124)/(61-71) 105/62 mmHg (04/10 0538) SpO2:  [93 %-98 %] 93 % (04/10 0538) Weight:  [91.2 kg (201 lb 1 oz)] 91.2 kg (201 lb 1 oz) (04/09 0900)  Neurologically intact ABD soft Neurovascular intact Dorsiflexion/Plantar flexion intact Incision: dressing C/D/I   LABS  Basename 03/22/12 0610 03/21/12 0530  HGB 10.7* 11.4*  WBC 8.2 7.7  PLT 93* 95*    Basename 03/22/12 0610 03/21/12 0530  NA 138 141  K 3.5 4.5  CL 100 106  CO2 29 28  BUN 28* 25*  CREATININE 2.03* 1.80*  GLUCOSE 113* 142*   No results found for this basename: LABPT:2,INR:2 in the last 72 hours   Assessment/Plan: 2 Days Post-Op Procedure(s) (LRB): TOTAL HIP ARTHROPLASTY (Left) HARDWARE REMOVAL (Left)  Advance diet Up with therapy D/C IV fluids Concerns about ADLS need to be addressed with Jemel Ono E 03/22/2012, 7:53 AM

## 2012-03-22 NOTE — Progress Notes (Signed)
Pt bladder scanned after voiding and found to have 794 cc of urine in bladder. Indwelling foley placed per MD order. Pt tolerated well.

## 2012-03-22 NOTE — Progress Notes (Signed)
Rept to Dr. Louanne Skye. Pt BP manual 96/60. Pt oxygen sat on RA is 85%. Pt is sleepy but easily arousable and no s/sx resp or cardiac distress and no c/o such. Pt sats 93% 2 lpm. Per Dr Otho Ket order, will hold AM dose of Lasix and Lopressor. Will also obtain CXR. Pt instructed the importance of performing IS to prevent pneumonia. Pt verb understanding and agrees to comply. Will continue to monitor.

## 2012-03-22 NOTE — Progress Notes (Signed)
CARE MANAGEMENT NOTE 03/22/2012  Patient:  Derek [name], Derek [name]   Account Number:  [id number]  Date Initiated:  03/22/2012  Documentation initiated by:  Ricki Miller  Subjective/Objective Assessment:   66 yr old male s/p left total hip arthroplasty     Action/Plan:   Spoke with patient regarding Home Health needs.Preoperatively setup with [facility], no changes.   Anticipated DC Date:  03/23/2012   Anticipated DC Plan:  [place] [facility]  CM consult      [facility] Choice  HOME HEALTH  DURABLE MEDICAL EQUIPMENT   Choice offered to / List presented to:  C-1 Patient   DME arranged  3-N-1  Vassie Moselle      DME agency  [place] arranged  [place].   Status of service:  Completed, signed off  Discharge Disposition:  [place]

## 2012-03-22 NOTE — Progress Notes (Signed)
PT Progress Note:     03/22/12 1344  PT Visit Information  Last PT Received On 03/22/12  Precautions  Precautions Posterior Hip  Precaution Comments pt unable to recall 3/3 precautions.  Reviewed all 3.  At end of session pt able to recall 3/3.   Required Braces or Orthoses No  Restrictions  Weight Bearing Restrictions Yes  LLE Weight Bearing WBAT  Bed Mobility  Bed Mobility No  Transfers  Transfers Yes  Sit to Stand (pt standing with Nsing upon arrival)  Stand to Sit 4: Min assist;With armrests;To chair/3-in-1  Stand to Sit Details cues for hand placement, LLE positioning, & use of UE's to control descent.  (A) to control descent  Ambulation/Gait  Ambulation/Gait Yes  Ambulation/Gait Assistance Other (comment) (Min Guard (A))  Ambulation/Gait Assistance Details (indicate cue type and reason) Pt just returning back from bathroom with Nsing but agreeable to ambulate more.  Cues for sequencing, increased LLE hip/knee flexion for increased floor clearance.    Ambulation Distance (Feet) 30 Feet  Assistive device Rolling walker  Gait Pattern Step-to pattern;Decreased hip/knee flexion - left;Decreased step length - left;Decreased step length - right;Decreased stance time - left  Stairs No  Wheelchair Mobility  Wheelchair Mobility No  Posture/Postural Control  Posture/Postural Control No significant limitations  Balance  Balance Assessed No  Exercises  Exercises Total Joint  Total Joint Exercises  Marching in Standing AROM;Strengthening;Both;10 reps;Standing  PT - End of Session  Equipment Utilized During Treatment Gait belt  Activity Tolerance Patient tolerated treatment well  Patient left in chair;Other (comment) (OT present to educate on Adaptive equipment)  General  Behavior During Session [facility] for tasks performed  Cognition [facility] for tasks performed  PT - Assessment/Plan  Comments on Treatment Session Pt with better pain control today- reports no pain at this time.   Increased ambulation distance today.  Discussed stair set-up at home with pt & will have pt perform tomorrow.    PT Plan Discharge plan remains appropriate;Frequency remains appropriate  PT Frequency 7X/week  Follow Up Recommendations Home health PT;Supervision/Assistance - 24 hour  Equipment Recommended Rolling walker with 5" wheels;3 in 1 bedside comode;Tub/shower bench  Acute Rehab PT Goals  PT Goal: Ambulate - Progress Progressing toward goal  Additional Goals  PT Goal: Additional Goal #1 - Progress Progressing toward goal       Sarajane Marek, [place] [phone] 03/22/2012

## 2012-03-22 NOTE — Progress Notes (Signed)
Pt is s/p L hip repair. Dec ROM LLE related to surgery. Hip precautions.  Pt has a dry mepilex to L hip. Slight edema noted of LLE. +cms. Pt is to be WBAT with RW.  Pt is alert and oriented x 3. Pt lungs CTA. Heart rate regular rate and rhythm. No s/sx cardiac or resp distress and no c/o such. Vital signs stable. Pt denies cough. Pt repts that he is performing IS per order. Pt repts that "up until today I have been nauseated" and this "is my first solid breakfast". Abdomen is soft and slight distended and "a little tender" at bladder area. Pt rept that he hasn't "passed gas". Pt repts LBM 4/7. Pt repts that he can only void small amounts "at a time". Will rept to MD on rounds and will bladder scan pt after next void. Pt has no pressure skin issues of heels or sacral area. Pt being assisted to turn/tilt every two hours and float heels. Pillow between legs for pressure reduction and hip precautions.

## 2012-03-22 NOTE — Discharge Instructions (Signed)
Home Health to be provided by [facility]- [phone]

## 2012-03-23 LAB — URINE MICROSCOPIC-ADD ON

## 2012-03-23 LAB — URINALYSIS, ROUTINE W REFLEX MICROSCOPIC
Bilirubin Urine: NEGATIVE
Glucose, UA: NEGATIVE mg/dL
Ketones, ur: NEGATIVE mg/dL
Nitrite: NEGATIVE
Protein, ur: NEGATIVE mg/dL
Specific Gravity, Urine: 1.01 (ref 1.005–1.030)
Urobilinogen, UA: 0.2 mg/dL (ref 0.0–1.0)
pH: 7 (ref 5.0–8.0)

## 2012-03-23 LAB — CBC
HCT: 28.7 % — ABNORMAL LOW (ref 39.0–52.0)
Hemoglobin: 9.3 g/dL — ABNORMAL LOW (ref 13.0–17.0)
MCH: 28 pg (ref 26.0–34.0)
MCHC: 32.4 g/dL (ref 30.0–36.0)
MCV: 86.4 fL (ref 78.0–100.0)
Platelets: 99 10*3/uL — ABNORMAL LOW (ref 150–400)
RBC: 3.32 MIL/uL — ABNORMAL LOW (ref 4.22–5.81)
RDW: 15 % (ref 11.5–15.5)
WBC: 7.5 10*3/uL (ref 4.0–10.5)

## 2012-03-23 MED ORDER — DIAZEPAM 5 MG PO TABS
10.0000 mg | ORAL_TABLET | Freq: Two times a day (BID) | ORAL | Status: DC
  Administered 2012-03-23 – 2012-03-27 (×7): 10 mg via ORAL
  Filled 2012-03-23 (×8): qty 2

## 2012-03-23 MED ORDER — HYDROCODONE-ACETAMINOPHEN 5-325 MG PO TABS
1.0000 | ORAL_TABLET | ORAL | Status: DC | PRN
  Administered 2012-03-23 – 2012-03-24 (×2): 1 via ORAL
  Administered 2012-03-25: 2 via ORAL
  Filled 2012-03-23: qty 2
  Filled 2012-03-23 (×2): qty 1

## 2012-03-23 NOTE — Progress Notes (Signed)
PT Cancel Note:   Pt declining PT session due to just finishing with OT & c/o increased pain (9/10). RN notified.  Sarajane Marek, [place] [phone] 03/23/2012

## 2012-03-23 NOTE — Progress Notes (Signed)
Occupational Therapy Treatment Patient Details Name: Derek [name] MRN: [id number] DOB: [birth year] Today's Date: 03/23/2012  OT Assessment/Plan OT Assessment/Plan OT Plan: Discharge plan remains appropriate OT Frequency: Min 2X/week Follow Up Recommendations: Home health OT;Supervision - Intermittent Equipment Recommended: Rolling walker with 5" wheels;3 in 1 bedside comode;Tub/shower bench OT Goals Acute Rehab OT Goals OT Goal Formulation: With patient Time For Goal Achievement: 7 days ADL Goals Pt Will Perform Grooming: with set-up;with supervision;Standing at sink ADL Goal: Grooming - Progress: Met Pt Will Transfer to Toilet: with supervision;with DME;Ambulation;3-in-1 ADL Goal: Toilet Transfer - Progress: Progressing toward goals  OT Treatment Precautions/Restrictions  Precautions Precautions: Posterior Hip Precaution Comments: Pt able to recall 3/3 precautions with min cueing Restrictions Weight Bearing Restrictions: Yes LLE Weight Bearing: Weight bearing as tolerated   ADL ADL Grooming: Performed;Wash/dry face;Set up Where Assessed - Grooming: Standing at sink Upper Body Dressing: Performed;Set up Upper Body Dressing Details (indicate cue type and reason): With donning gown Where Assessed - Upper Body Dressing: Sitting, chair Toilet Transfer: Simulated;Minimal assistance Toilet Transfer Details (indicate cue type and reason): Min verbal cues for left LE placement Toilet Transfer Method: Ambulating Toilet Transfer Equipment: Other (comment) (recliner) Equipment Used: Rolling walker ADL Comments: Pt. re-educated on 3 hip precautions due to pt. only able to recall 2/3 precautions. Pt. requires mod verbal cues during mobility to prevent foot from turning inward.  Mobility  Bed Mobility Bed Mobility: Yes Supine to Sit: 3: Mod assist;With rails;HOB flat Supine to Sit Details (indicate cue type and reason): Pt. provided with bil UE assist to elevate trunk off  bed Transfers Transfers: Yes Sit to Stand: 4: Min assist;From chair/3-in-1;With upper extremity assist Sit to Stand Details (indicate cue type and reason): With min verbal cues for placement of left LE Stand to Sit: Other (comment);To chair/3-in-1;With armrests;With upper extremity assist (Min Guard (A)) Stand to Sit Details: Cues for LLE positioning & technique   End of Session OT - End of Session Equipment Utilized During Treatment: Gait belt Activity Tolerance: Patient tolerated treatment well Patient left: in chair;with call bell in reach Nurse Communication: Mobility status for transfers General Behavior During Session: [facility] for tasks performed Cognition: [facility] for tasks performed  Marks Scalera, OTR/L Pager [phone]  03/23/2012, 2:09 PM

## 2012-03-23 NOTE — Progress Notes (Signed)
Subjective: Pt awake and alert, oriented x3.  Wife present.  Pt required foley because of urinary retention.  States he doesn't have pain until after PT.  RN noted some changes in behavior today earlier.  Pt usually takes Valium 10 mg bid for anxiety and has since 2005.  Got first dose today.  Norco 7.5 tends to make him very drowsy and he develops hypoxia.  Slow with activity.  Pt eating and drinking.  Wife concerned because he only has one kidney.  Plenty of urine in foley now   Objective: Vital signs in last 24 hours: Temp:  [98.3 F (36.8 C)-99.5 F (37.5 C)] 99.1 F (37.3 C) (04/11 1321) Pulse Rate:  [68-94] 78  (04/11 1321) Resp:  [16-18] 16  (04/11 1321) BP: (100-131)/(52-76) 100/52 mmHg (04/11 1321) SpO2:  [91 %-97 %] 97 % (04/11 1321)  Intake/Output from previous day: 04/10 0701 - 04/11 0700 In: 480 [P.O.:480] Out: 1600 [Urine:1600] Intake/Output this shift:     Basename 03/23/12 0520 03/22/12 0610 03/21/12 0530  HGB 9.3* 10.7* 11.4*    Basename 03/23/12 0520 03/22/12 0610  WBC 7.5 8.2  RBC 3.32* 3.77*  HCT 28.7* 33.2*  PLT 99* 93*    Basename 03/22/12 0610 03/21/12 0530  NA 138 141  K 3.5 4.5  CL 100 106  CO2 29 28  BUN 28* 25*  CREATININE 2.03* 1.80*  GLUCOSE 113* 142*  CALCIUM 8.5 8.5   No results found for this basename: LABPT:2,INR:2 in the last 72 hours  Neurovascular intact Intact pulses distally Dorsiflexion/Plantar flexion intact Incision: no drainage CXR no acute dx Assessment/Plan: 1.  POD 3  THR:  Slow progress.  Will cut back on pain meds, and add Valium as he takes at home. 2.  Urinary retention.  Start Flomax.  Continue cath until tomorrow and have voiding trial. Check UA now 3.  Disposition:  Home with HHPT when medical issues stable  Demmi Sindt M 03/23/2012, 2:01 PM

## 2012-03-23 NOTE — Progress Notes (Addendum)
Pt is s/p L hip repair. +cms. WBAT with RW and one assist. Hip precautions. Mepilex dressing to R hip is dry and intact. Pt repts that his pain is 1 on scale of 1-10. Pt is slightly HOH.  Pt is alert and oriented x 3 but forgetful and inappropriate at times. Pt this morning stated he needed to go to the bathroom. Pt has a foley due to pt was unable to empty bladder yesterday. Foley is draining mod amount of amber urine without difficulty. Pt assisted to bathroom. Pt states, "I have been in the bathroom several times this morning". Pt asked if he needed a suppository. Pt states, "I don't have to use the bathroom". Pt asked why he is walking into the bathroom and sitting on the toliet and he states, "I just want to walk in here and sit". Pt offered to be assisted to walk in the hall. Pt states, "No, I don't want to do that". Pt said something inaudible and when RN asked him what he said he just giggled and said, "Nothing". Pt has to be reminded things several times. Pt follows commands and is not impulsive but will leave pt on bed alarm and monitor closely for fall risk. Pt abdomen is only slightly distended today. Abdomen is soft and nontender and bowel sounds are hyperactive x 4. Pt only ate about 15% of his breakfast. Pt states, "I just don't want it". Pt repts LBM 4/7. Pt repts passing gas and repts that nausea and vomiting that he was experiencing yesterday is "better" today. Pt is receiving stool softners per order. Pt lungs CTA. Heart rate regular rate and rhythm. No s/sx cardiac or resp distress and no c/o such. Vital signs are stable. Pt has to be reinstructed regarding IS use several times due to forgetfulness. Pt repts that he is coughing up a small amount of yellow phelgm. Pt has no pressure skin issues of heels or sacral area. Pt being assisted to turn/tilt every two hours and floating heels.

## 2012-03-23 NOTE — Progress Notes (Signed)
PT Progress Note:    03/23/12 1300  PT Visit Information  Last PT Received On 03/23/12  Precautions  Precautions Posterior Hip  Precaution Comments Pt able to recall 3/3 precautions with min cueing  Restrictions  Weight Bearing Restrictions Yes  LLE Weight Bearing WBAT  Bed Mobility  Bed Mobility No  Transfers  Transfers Yes  Sit to Stand 4: Min assist;From chair/3-in-1;With upper extremity assist (performed 3x's. )  Sit to Stand Details (indicate cue type and reason) (A) to achieve standing, anterior translation, balance, & safety.  Cues for initiation, hand placement & technique.   Stand to Sit Other (comment);To chair/3-in-1;With armrests;With upper extremity assist (Min Guard (A))  Stand to Sit Details Cues for LLE positioning & technique  Ambulation/Gait  Ambulation/Gait Yes  Ambulation/Gait Assistance Other (comment) (Min Guard (A))  Ambulation/Gait Assistance Details (indicate cue type and reason) Distance limited due to pt's 02 sats dropping to 84% on RA.    Ambulation Distance (Feet) 15 Feet  Assistive device Rolling walker  Gait Pattern Step-to pattern;Decreased hip/knee flexion - left;Decreased stance time - left;Decreased step length - right  Stairs Yes  Stairs Assistance 4: Min assist  Stairs Assistance Details (indicate cue type and reason) cues for technique.  (A) for balance & safety  Stair Management Technique No rails;With walker;Forwards;Step to pattern  Number of Stairs 1  (2x's)  Wheelchair Mobility  Wheelchair Mobility No  Posture/Postural Control  Posture/Postural Control No significant limitations  Balance  Balance Assessed No  Exercises  Exercises Total Joint  Total Joint Exercises  Ankle Circles/Pumps AROM;Both;10 reps;Seated  Quad Sets AROM;Strengthening;5 reps;10 reps;Seated  Gluteal Sets AROM;Strengthening;Both;10 reps;Seated  Hip ABduction/ADduction AAROM;Strengthening;Left;10 reps;Seated  Long [employer] AAROM;Strengthening;10  reps;Left;Seated  Marching in Standing AAROM;Strengthening;Left;10 reps;Seated  PT - End of Session  Equipment Utilized During Treatment Gait belt  Activity Tolerance Patient tolerated treatment well  Patient left in chair;Other (comment)  Nurse Communication Mobility status for transfers;Mobility status for ambulation  General  Behavior During Session [facility] for tasks performed  Cognition [facility] for tasks performed  PT - Assessment/Plan  Comments on Treatment Session ambulation distance limited due to pt's 02 sats dropping to mid 80's RA with activity.  Iniitiated stair training today.    PT Plan Discharge plan remains appropriate;Frequency remains appropriate  PT Frequency 7X/week  Follow Up Recommendations Home health PT;Supervision/Assistance - 24 hour  Equipment Recommended Rolling walker with 5" wheels;3 in 1 bedside comode;Tub/shower bench  Acute Rehab PT Goals  PT Goal: Sit to Stand - Progress Progressing toward goal  PT Goal: Ambulate - Progress Not met  PT Goal: Up/Down Stairs - Progress Progressing toward goal  PT Goal: Perform Home Exercise Program - Progress Progressing toward goal  Additional Goals  PT Goal: Additional Goal #1 - Progress Progressing toward goal      Sarajane Marek, [place] [phone] 03/23/2012

## 2012-03-24 ENCOUNTER — Inpatient Hospital Stay (HOSPITAL_COMMUNITY): Payer: Medicare Other

## 2012-03-24 DIAGNOSIS — R4182 Altered mental status, unspecified: Secondary | ICD-10-CM | POA: Diagnosis not present

## 2012-03-24 DIAGNOSIS — R0902 Hypoxemia: Secondary | ICD-10-CM | POA: Diagnosis not present

## 2012-03-24 LAB — DIFFERENTIAL
Basophils Absolute: 0 10*3/uL (ref 0.0–0.1)
Basophils Relative: 0 % (ref 0–1)
Eosinophils Absolute: 0.1 10*3/uL (ref 0.0–0.7)
Eosinophils Relative: 3 % (ref 0–5)
Lymphocytes Relative: 16 % (ref 12–46)
Lymphs Abs: 0.7 10*3/uL (ref 0.7–4.0)
Monocytes Absolute: 0.3 10*3/uL (ref 0.1–1.0)
Monocytes Relative: 8 % (ref 3–12)
Neutro Abs: 3 10*3/uL (ref 1.7–7.7)
Neutrophils Relative %: 73 % (ref 43–77)

## 2012-03-24 LAB — BLOOD GAS, ARTERIAL
Acid-Base Excess: 5.7 mmol/L — ABNORMAL HIGH (ref 0.0–2.0)
Bicarbonate: 29.4 mEq/L — ABNORMAL HIGH (ref 20.0–24.0)
Drawn by: 332341
FIO2: 0.21 %
O2 Saturation: 91.7 %
Patient temperature: 98.6
TCO2: 30.7 mmol/L (ref 0–100)
pCO2 arterial: 40.8 mmHg (ref 35.0–45.0)
pH, Arterial: 7.471 — ABNORMAL HIGH (ref 7.350–7.450)
pO2, Arterial: 55.7 mmHg — ABNORMAL LOW (ref 80.0–100.0)

## 2012-03-24 LAB — CBC
HCT: 28.9 % — ABNORMAL LOW (ref 39.0–52.0)
Hemoglobin: 9.3 g/dL — ABNORMAL LOW (ref 13.0–17.0)
MCH: 28.1 pg (ref 26.0–34.0)
MCHC: 32.2 g/dL (ref 30.0–36.0)
MCV: 87.3 fL (ref 78.0–100.0)
Platelets: 109 10*3/uL — ABNORMAL LOW (ref 150–400)
RBC: 3.31 MIL/uL — ABNORMAL LOW (ref 4.22–5.81)
RDW: 15.1 % (ref 11.5–15.5)
WBC: 4.2 10*3/uL (ref 4.0–10.5)

## 2012-03-24 LAB — BASIC METABOLIC PANEL
BUN: 33 mg/dL — ABNORMAL HIGH (ref 6–23)
CO2: 29 mEq/L (ref 19–32)
Calcium: 8.6 mg/dL (ref 8.4–10.5)
Chloride: 101 mEq/L (ref 96–112)
Creatinine, Ser: 1.87 mg/dL — ABNORMAL HIGH (ref 0.50–1.35)
GFR calc Af Amer: 42 mL/min — ABNORMAL LOW (ref >90)
GFR calc non Af Amer: 36 mL/min — ABNORMAL LOW (ref >90)
Glucose, Bld: 112 mg/dL — ABNORMAL HIGH (ref 70–99)
Potassium: 3.2 mEq/L — ABNORMAL LOW (ref 3.5–5.1)
Sodium: 139 mEq/L (ref 135–145)

## 2012-03-24 MED ORDER — TECHNETIUM TO 99M ALBUMIN AGGREGATED
3.0000 | Freq: Once | INTRAVENOUS | Status: AC | PRN
  Administered 2012-03-24: 3 via INTRAVENOUS

## 2012-03-24 MED ORDER — XENON XE 133 GAS
18.0000 | GAS_FOR_INHALATION | Freq: Once | RESPIRATORY_TRACT | Status: AC | PRN
  Administered 2012-03-24: 18 via RESPIRATORY_TRACT

## 2012-03-24 MED ORDER — ALBUTEROL SULFATE (5 MG/ML) 0.5% IN NEBU
2.5000 mg | INHALATION_SOLUTION | Freq: Three times a day (TID) | RESPIRATORY_TRACT | Status: DC
  Administered 2012-03-24 – 2012-03-25 (×2): 2.5 mg via RESPIRATORY_TRACT
  Filled 2012-03-24 (×2): qty 0.5

## 2012-03-24 MED ORDER — IPRATROPIUM BROMIDE 0.02 % IN SOLN
0.5000 mg | Freq: Three times a day (TID) | RESPIRATORY_TRACT | Status: DC
  Administered 2012-03-24 – 2012-03-25 (×2): 0.5 mg via RESPIRATORY_TRACT
  Filled 2012-03-24 (×2): qty 2.5

## 2012-03-24 MED FILL — Heparin Sodium (Porcine) Inj 1000 Unit/ML: INTRAMUSCULAR | Qty: 30 | Status: AC

## 2012-03-24 MED FILL — Sodium Chloride IV Soln 0.9%: INTRAVENOUS | Qty: 1000 | Status: AC

## 2012-03-24 NOTE — Progress Notes (Signed)
OT Cancellation Note  Treatment cancelled today due to pt. off the floor at this time will re-attempt as time allows. Thanks!Marland Kitchen  Sirr Kabel, OTR/L Pager [phone] 03/24/2012, 1:43 PM

## 2012-03-24 NOTE — Progress Notes (Signed)
Pt oxygen removed oxygen saturation rechecked. Pt oxygen on RA 92%. Pt remains inappropriate but alert and oriented x 3. Pt is forgetful. Pt previously oxygen on RA 85%. Will continue to monitor. MD aware.
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
## 2012-03-24 NOTE — Progress Notes (Signed)
Reviewed, jen

## 2012-03-24 NOTE — Progress Notes (Addendum)
PT Cancel Note:   PT session held this AM due to RN stating MD has ordered VQ scan to r/o DVT/PE.  Will attempt back later.    Sarajane Marek, [place] [phone] 03/24/2012

## 2012-03-24 NOTE — Progress Notes (Signed)
Utilization review completed. Rozanna Boer, RN, BSN. 03/24/12

## 2012-03-24 NOTE — Progress Notes (Signed)
Subjective: 4 Days Post-Op Procedure(s) (LRB): TOTAL HIP ARTHROPLASTY (Left) HARDWARE REMOVAL (Left)Complains of pain Left hip. Nursing notes inappropriate at times. O2 On RA drops saturation into the 80s. Apparently he Takes valium BID continuously at home and here it Has been a prn med which he has not been taking.  Patient reports pain as moderate.    Objective:   VITALS:  Temp:  [99.1 F (37.3 C)-99.3 F (37.4 C)] 99.3 F (37.4 C) (04/12 0614) Pulse Rate:  [68-82] 82  (04/12 0614) Resp:  [16-18] 18  (04/12 0614) BP: (100-127)/(52-76) 126/58 mmHg (04/12 0614) SpO2:  [95 %-97 %] 96 % (04/12 [id number])  Neurologically intact Left leg with  Mild swelling. Left foot DF and PF Is normal. Homan's sign is negative. Knows the day and does have good understanding on why he is in hospital.  LABS  Basename 03/23/12 0520 03/22/12 0610  HGB 9.3* 10.7*  WBC 7.5 8.2  PLT 99* 93*    Basename 03/24/12 0550 03/22/12 0610  NA 139 138  K 3.2* 3.5  CL 101 100  CO2 29 29  BUN 33* 28*  CREATININE 1.87* 2.03*  GLUCOSE 112* 113*   No results found for this basename: LABPT:2,INR:2 in the last 72 hours   Assessment/Plan: 4 Days Post-Op Procedure(s) (LRB): TOTAL HIP ARTHROPLASTY (Left) HARDWARE REMOVAL (Left) Low O2 saturatiions on RA, h/o smoking CXR neg. H/O Valium use off for several days could cause withdrawal symptoms. Low grad fever, confusion.  Up with therapy D/C foley today and trial of voiding While on Flomax. Medicine consult to eval decreased sat. Confusion Re check Sats this AM. Not medically stable for discharge at this time.  Simisola Sandles E 03/24/2012, 8:07 AM

## 2012-03-24 NOTE — Progress Notes (Signed)
   CARE MANAGEMENT NOTE 03/24/2012  Patient:  Derek [name], Derek [name]   Account Number:  [id number]  Date Initiated:  03/22/2012  Documentation initiated by:  Ricki Miller  Subjective/Objective Assessment:   66 yr old male s/p left total hip arthroplasty     Action/Plan:   Spoke with patient regarding Home Health needs.Preoperatively setup with [facility], no changes.   Anticipated DC Date:  03/23/2012   Anticipated DC Plan:  [place] [facility]  CM consult      [place]   Choice offered to / List presented to:  C-1 Patient   DME arranged  3-N-1  Derek [name]      DME agency  [place] arranged  [place].   Status of service:  Completed, signed off Medicare Important Message given?   (If response is "NO", the following Medicare IM given date fields will be blank) Date Medicare IM given:   Date Additional Medicare IM given:    Discharge Disposition:  [place]  Per UR Regulation:    If discussed at Long Length of Stay Meetings, dates discussed:    Comments:  Pt was set up with [facility]. Jonnie Finner RN CCM

## 2012-03-24 NOTE — Progress Notes (Signed)
PT Cancellation Note  Treatment cancelled today due to patient receiving procedure or test. Attempted to see patient again this afternoon however still in VQ Scan. Will follow up with patient in the AM  Thanks 03/24/2012 Jacqualyn Posey PTA [id number] pager [phone] office     Jacqualyn Posey 03/24/2012, 2:10 PM

## 2012-03-25 LAB — BASIC METABOLIC PANEL
BUN: 30 mg/dL — ABNORMAL HIGH (ref 6–23)
CO2: 30 mEq/L (ref 19–32)
Calcium: 8.5 mg/dL (ref 8.4–10.5)
Chloride: 100 mEq/L (ref 96–112)
Creatinine, Ser: 1.81 mg/dL — ABNORMAL HIGH (ref 0.50–1.35)
GFR calc Af Amer: 43 mL/min — ABNORMAL LOW (ref >90)
GFR calc non Af Amer: 37 mL/min — ABNORMAL LOW (ref >90)
Glucose, Bld: 105 mg/dL — ABNORMAL HIGH (ref 70–99)
Potassium: 3 mEq/L — ABNORMAL LOW (ref 3.5–5.1)
Sodium: 140 mEq/L (ref 135–145)

## 2012-03-25 LAB — CBC
HCT: 27.7 % — ABNORMAL LOW (ref 39.0–52.0)
Hemoglobin: 9 g/dL — ABNORMAL LOW (ref 13.0–17.0)
MCH: 28.1 pg (ref 26.0–34.0)
MCHC: 32.5 g/dL (ref 30.0–36.0)
MCV: 86.6 fL (ref 78.0–100.0)
Platelets: 112 10*3/uL — ABNORMAL LOW (ref 150–400)
RBC: 3.2 MIL/uL — ABNORMAL LOW (ref 4.22–5.81)
RDW: 15.1 % (ref 11.5–15.5)
WBC: 4.1 10*3/uL (ref 4.0–10.5)

## 2012-03-25 MED ORDER — HYDROMORPHONE HCL PF 1 MG/ML IJ SOLN: 0.5000 mg | INTRAMUSCULAR | Status: DC | PRN

## 2012-03-25 MED ORDER — POLYETHYLENE GLYCOL 3350 17 G PO PACK
17.0000 g | PACK | Freq: Two times a day (BID) | ORAL | Status: DC
  Administered 2012-03-25 – 2012-03-26 (×2): 17 g via ORAL
  Filled 2012-03-25 (×5): qty 1

## 2012-03-25 MED ORDER — TRAMADOL HCL 50 MG PO TABS: 100.0000 mg | ORAL_TABLET | Freq: Four times a day (QID) | ORAL | Status: DC | PRN

## 2012-03-25 MED ORDER — ALBUTEROL SULFATE (5 MG/ML) 0.5% IN NEBU: 2.5000 mg | INHALATION_SOLUTION | RESPIRATORY_TRACT | Status: DC | PRN

## 2012-03-25 MED ORDER — TRAMADOL HCL 50 MG PO TABS
25.0000 mg | ORAL_TABLET | Freq: Four times a day (QID) | ORAL | Status: DC | PRN
  Administered 2012-03-25 – 2012-03-27 (×4): 25 mg via ORAL
  Filled 2012-03-25 (×4): qty 1

## 2012-03-25 MED ORDER — POTASSIUM CHLORIDE CRYS ER 20 MEQ PO TBCR
40.0000 meq | EXTENDED_RELEASE_TABLET | Freq: Every day | ORAL | Status: DC
  Administered 2012-03-25 – 2012-03-27 (×3): 40 meq via ORAL
  Filled 2012-03-25 (×3): qty 2

## 2012-03-25 NOTE — Progress Notes (Signed)
Physical Therapy Treatment Note   03/25/12 1500  PT Visit Information  Last PT Received On 03/25/12  Precautions  Precautions Posterior Hip  Precaution Comments pt able to recall but doesn't adhere to them  Restrictions  LLE Weight Bearing WBAT  Bed Mobility  Supine to Sit 4: Min assist  Supine to Sit Details (indicate cue type and reason) assist at trunk and L LE due to not using hand rails to mimic home set up  Transfers  Sit to Stand 5: Supervision  Sit to Stand Details (indicate cue type and reason) v/c's for hand placement, increased time required  Stand to Sit 5: Supervision  Stand to Sit Details v/c's for L LE management  Ambulation/Gait  Ambulation/Gait Assistance 4: Min assist (contact guard)  Ambulation/Gait Assistance Details (indicate cue type and reason) pt with increased L LE wbing tolerance  Ambulation Distance (Feet) 50 Feet (x2, to/from gym, 5 min sitting rest break)  Assistive device Rolling walker  Gait Pattern Step-to pattern;Decreased step length - left;Decreased stance time - left  Gait velocity significant improvement from previous week  Stairs Yes  Stairs Assistance 4: Min assist  Stairs Assistance Details (indicate cue type and reason) cues for technique, contact guard  Stair Management Technique No rails;Forwards;With walker (also completed backwards)  Number of Stairs 2   Exercises  Exercises (handout provided to wife)  Total Joint Exercises  Ankle Circles/Pumps AROM;Both;10 reps;Seated  Quad Sets AROM;Strengthening;Left;10 reps;Seated  Gluteal Sets AROM;Both;10 reps  Heel Slides AAROM;Left;10 reps  PT - End of Session  Equipment Utilized During Treatment Gait belt  Activity Tolerance Patient tolerated treatment well  Patient left in chair;with call bell in reach;with family/visitor present  Nurse Communication Mobility status for transfers;Mobility status for ambulation  General  Behavior During Session [facility] for tasks performed  Cognition [facility]  for tasks performed  PT - Assessment/Plan  Comments on Treatment Session Pt with significant improvement in both transfers and ambulation abiltiy this date. If patient con't to progress like today patient most likely able to return home tomorrow however if patient regresses to last weeks condition patient will most likely need SNF placement.  PT Plan Discharge plan remains appropriate;Frequency remains appropriate  PT Frequency 7X/week  Follow Up Recommendations No PT follow up;Supervision/Assistance - 24 hour  Equipment Recommended Rolling walker with 5" wheels;3 in 1 bedside comode  Acute Rehab PT Goals  PT Goal: Supine/Side to Sit - Progress Progressing toward goal  PT Goal: Sit to Supine/Side - Progress Progressing toward goal  PT Goal: Sit to Stand - Progress Progressing toward goal  PT Goal: Ambulate - Progress Progressing toward goal  PT Goal: Up/Down Stairs - Progress Progressing toward goal  PT Goal: Perform Home Exercise Program - Progress Progressing toward goal  Additional Goals  PT Goal: Additional Goal #1 - Progress Progressing toward goal    Pain: 7/10 L hip pain  Kittie Plater, PT, DPT Pager #: [phone] Office #: [phone]

## 2012-03-25 NOTE — Progress Notes (Signed)
Subjective: 5 Days Post-Op Procedure(s) (LRB): TOTAL HIP ARTHROPLASTY (Left) HARDWARE REMOVAL (Left) this patient is awake alert intermittently confused. O2 sats seem to be improved. The scan was unremarkable and low problem. Difficulties with retention continue. We'll discontinue her by mouth narcotics other than tramadol and discontinue methocarbamol. Unfortunately Valium that he takes may be a cause of urinary retention is well however concern about withdrawal is evident. Will request urology evaluation Patient reports pain as moderate.    Objective:   VITALS:  Temp:  [97.5 F (36.4 C)-99 F (37.2 C)] 98.4 F (36.9 C) (04/13 0557) Pulse Rate:  [75-77] 77  (04/13 0557) Resp:  [16-20] 20  (04/13 0557) BP: (104-140)/(60-86) 104/63 mmHg (04/13 0557) SpO2:  [91 %-97 %] 96 % (04/13 0830)  Neurologically intact ABD soft Neurovascular intact Sensation intact distally Intact pulses distally Dorsiflexion/Plantar flexion intact Incision: dressing C/D/I and no drainage   LABS  Basename 03/25/12 0500 03/24/12 0900 03/23/12 0520  HGB 9.0* 9.3* 9.3*  WBC 4.1 4.2 --  PLT 112* 109* --    Basename 03/25/12 0500 03/24/12 0550  NA 140 139  K 3.0* 3.2*  CL 100 101  CO2 30 29  BUN 30* 33*  CREATININE 1.81* 1.87*  GLUCOSE 105* 112*   No results found for this basename: LABPT:2,INR:2 in the last 72 hours   Assessment/Plan: 5 Days Post-Op Procedure(s) (LRB): TOTAL HIP ARTHROPLASTY (Left) HARDWARE REMOVAL (Left) Urinary retention secondary to narcotics and muscle relaxer. Hypoxemia likely secondary to atelectasis, narcotic use. Confusion likely secondary to medication, hypoxia and delay restart of benzodiazepine he has been taking for the early years.  Advance diet Up with therapy We'll need to evaluate problems with urinary retention urology consult. We will need to look into a skilled nursing facility placement as it is likely this patient may need some intensive rehabilitation and  closer followup skilled nursing facility.   Markie Frith E 03/25/2012, 10:10 AM

## 2012-03-25 NOTE — Progress Notes (Signed)
Patient unable to void post Foley cath removal since 1730.  Bladder scanned at 0115, showed 24mL in bladder.  I+O cath performed, 555mL expressed from bladder.

## 2012-03-25 NOTE — Consult Note (Signed)
Urology Consult  Referring physician: Basil Dess M.D. Reason for referral: Urinary retention  History of Present Illness: Derek [name] is a 66 year old white male. He tells me he was under the care of Dr. Rosana Hoes in our office in the past but has not been back since Dr. Rosana Hoes left our practice. Apparently he was seen for "routine" prostate checks. Patient is now postoperative day 5 status post a total hip arthroplasty. He has continued to have trouble with urinary retention. Foley catheter was removed apparently yesterday evening. He has required intermittent catheterization. This afternoon he was able to void 500 cc with a reasonable stream. I went ahead and performed an in out catheterization on him after the bladder scan had suggested a residual urine of over 300 cc. I obtained 350 cc of clear urine. The patient had very little sensation with that volume in his bladder. The patient has been on tamsulosin for approximately 2-3 days. Prior to hospital admission he was on no urologic medication. As a baseline he does appear to have at least some component of both obstructive and some irritative voiding symptoms.  Past Medical History  Diagnosis Date  . Chronic idiopathic thrombocytopenia   . History of intermittent claudication   . HLD (hyperlipidemia)     mixed  . Coronary atherosclerosis of native coronary vessel   . Edema   . Myocardial infarction   . Angina     QUESTIONABLE  PULLING  BEHIND RIBS LEFT SIDE X 5 MONTHS   . Hypertension   . Peripheral vascular disease   . Chronic renal insufficiency     1 KIDNEY   . GERD (gastroesophageal reflux disease)   . Arthritis   . Depression   . Anxiety    Past Surgical History  Procedure Date  . Hernia repair   . Total hip arthroplasty   . Tonsillectomy   . Cardiac catheterization 2010    patent stent and nonobsturctive cad following an abnormal cardiolite study may of 2010 with an apical lateral defect  . Coronary angioplasty   . No past  surgeries     1962 BROKE LT HIP WAS PINNED  . Total hip arthroplasty 03/20/2012    Procedure: TOTAL HIP ARTHROPLASTY;  Surgeon: Jessy Oto, MD;  Location: [facility];  Service: Orthopedics;  Laterality: Left;  Left total hip replacement, ceramic on poly  . Hardware removal 03/20/2012    Procedure: HARDWARE REMOVAL;  Surgeon: Jessy Oto, MD;  Location: [facility];  Service: Orthopedics;  Laterality: Left;  Hardware removal 3 knowles pins left hip    Medications:  Scheduled:   . allopurinol  300 mg Oral Daily  . buPROPion  100 mg Oral BID  . diazepam  10 mg Oral Q12H  . docusate sodium  100 mg Oral BID  . furosemide  40 mg Oral Daily  . metoprolol tartrate  25 mg Oral BID  . omega-3 acid ethyl esters  1 g Oral BID  . pantoprazole  40 mg Oral Q1200  . polyethylene glycol  17 g Oral BID  . potassium chloride  40 mEq Oral Daily  . rivaroxaban  10 mg Oral Q breakfast  . simvastatin  40 mg Oral q1800  . Tamsulosin HCl  0.4 mg Oral Daily  . DISCONTD: albuterol  2.5 mg Nebulization TID  . DISCONTD: ipratropium  0.5 mg Nebulization TID    Allergies: No Known Allergies  Family History  Problem Relation Age of Onset  . Stroke Other   .  Coronary artery disease Other     Social History:  reports that he quit smoking about 18 years ago. His smoking use included Cigarettes. He has a 105 pack-year smoking history. He has never used smokeless tobacco. He reports that he does not drink alcohol or use illicit drugs.  Review of systems is really negative except for the voiding symptoms mentioned above and had pain with difficulty with ambulation.  Physical Exam:  Vital signs in last 24 hours: Temp:  [97.5 F (36.4 C)-98.4 F (36.9 C)] 98.4 F (36.9 C) (04/13 0557) Pulse Rate:  [75-77] 77  (04/13 0557) Resp:  [18-20] 20  (04/13 0557) BP: (104-132)/(60-64) 104/63 mmHg (04/13 0557) SpO2:  [91 %-97 %] 96 % (04/13 0830)  Constitutional: Vital signs reviewed. WD WN in NAD Head: Normocephalic and  atraumatic   Eyes: PERRL, No scleral icterus.  Neck: Supple No  Gross JVD, mass, thyromegaly, or carotid bruit present.  Cardiovascular: RRR Pulmonary/Chest: Normal effort Abdominal: Soft. Non-tender, non-distended, bowel sounds are normal, no masses, organomegaly, or guarding present.  Genitourinary: Rectal exam deferred today at patient request and also due to inability to lay on his side. Extremities: No cyanosis or edema  Neurological: Grossly non-focal.  Skin: Warm,very dry and intact. No rash, cyanosis   Laboratory Data:  Results for orders placed during the hospital encounter of 03/20/12 (from the past 72 hour(s))  CBC     Status: Abnormal   Collection Time   03/23/12  5:20 AM      Component Value Range Comment   WBC 7.5  4.0 - 10.5 (K/uL) WHITE COUNT CONFIRMED ON SMEAR   RBC 3.32 (*) 4.22 - 5.81 (MIL/uL)    Hemoglobin 9.3 (*) 13.0 - 17.0 (g/dL)    HCT 28.7 (*) 39.0 - 52.0 (%)    MCV 86.4  78.0 - 100.0 (fL)    MCH 28.0  26.0 - 34.0 (pg)    MCHC 32.4  30.0 - 36.0 (g/dL)    RDW 15.0  11.5 - 15.5 (%)    Platelets 99 (*) 150 - 400 (K/uL) CONSISTENT WITH PREVIOUS RESULT  URINALYSIS, ROUTINE W REFLEX MICROSCOPIC     Status: Abnormal   Collection Time   03/23/12  2:38 PM      Component Value Range Comment   Color, Urine YELLOW  YELLOW     APPearance CLEAR  CLEAR     Specific Gravity, Urine 1.010  1.005 - 1.030     pH 7.0  5.0 - 8.0     Glucose, UA NEGATIVE  NEGATIVE (mg/dL)    Hgb urine dipstick SMALL (*) NEGATIVE     Bilirubin Urine NEGATIVE  NEGATIVE     Ketones, ur NEGATIVE  NEGATIVE (mg/dL)    Protein, ur NEGATIVE  NEGATIVE (mg/dL)    Urobilinogen, UA 0.2  0.0 - 1.0 (mg/dL)    Nitrite NEGATIVE  NEGATIVE     Leukocytes, UA TRACE (*) NEGATIVE    URINE MICROSCOPIC-ADD ON     Status: Abnormal   Collection Time   03/23/12  2:38 PM      Component Value Range Comment   Squamous Epithelial / LPF RARE  RARE     WBC, UA 0-2  <3 (WBC/hpf)    RBC / HPF 3-6  <3 (RBC/hpf)     Bacteria, UA RARE  RARE     Casts HYALINE CASTS (*) NEGATIVE    BASIC METABOLIC PANEL     Status: Abnormal   Collection Time   03/24/12  5:50 AM      Component Value Range Comment   Sodium 139  135 - 145 (mEq/L)    Potassium 3.2 (*) 3.5 - 5.1 (mEq/L)    Chloride 101  96 - 112 (mEq/L)    CO2 29  19 - 32 (mEq/L)    Glucose, Bld 112 (*) 70 - 99 (mg/dL)    BUN 33 (*) 6 - 23 (mg/dL)    Creatinine, Ser 1.87 (*) 0.50 - 1.35 (mg/dL)    Calcium 8.6  8.4 - 10.5 (mg/dL)    GFR calc non Af Amer 36 (*) >90 (mL/min)    GFR calc Af Amer 42 (*) >90 (mL/min)   CBC     Status: Abnormal   Collection Time   03/24/12  9:00 AM      Component Value Range Comment   WBC 4.2  4.0 - 10.5 (K/uL)    RBC 3.31 (*) 4.22 - 5.81 (MIL/uL)    Hemoglobin 9.3 (*) 13.0 - 17.0 (g/dL)    HCT 28.9 (*) 39.0 - 52.0 (%)    MCV 87.3  78.0 - 100.0 (fL)    MCH 28.1  26.0 - 34.0 (pg)    MCHC 32.2  30.0 - 36.0 (g/dL)    RDW 15.1  11.5 - 15.5 (%)    Platelets 109 (*) 150 - 400 (K/uL) CONSISTENT WITH PREVIOUS RESULT  DIFFERENTIAL     Status: Normal   Collection Time   03/24/12  9:00 AM      Component Value Range Comment   Neutrophils Relative 73  43 - 77 (%)    Neutro Abs 3.0  1.7 - 7.7 (K/uL)    Lymphocytes Relative 16  12 - 46 (%)    Lymphs Abs 0.7  0.7 - 4.0 (K/uL)    Monocytes Relative 8  3 - 12 (%)    Monocytes Absolute 0.3  0.1 - 1.0 (K/uL)    Eosinophils Relative 3  0 - 5 (%)    Eosinophils Absolute 0.1  0.0 - 0.7 (K/uL)    Basophils Relative 0  0 - 1 (%)    Basophils Absolute 0.0  0.0 - 0.1 (K/uL)   BLOOD GAS, ARTERIAL     Status: Abnormal   Collection Time   03/24/12 12:40 PM      Component Value Range Comment   FIO2 0.21      pH, Arterial 7.471 (*) 7.350 - 7.450     pCO2 arterial 40.8  35.0 - 45.0 (mmHg)    pO2, Arterial 55.7 (*) 80.0 - 100.0 (mmHg)    Bicarbonate 29.4 (*) 20.0 - 24.0 (mEq/L)    TCO2 30.7  0 - 100 (mmol/L)    Acid-Base Excess 5.7 (*) 0.0 - 2.0 (mmol/L)    O2 Saturation 91.7      Patient  temperature 98.6      Collection site LEFT RADIAL      Drawn by [id number]      Sample type ARTERIAL DRAW      Allens test (pass/fail) PASS  PASS    CBC     Status: Abnormal   Collection Time   03/25/12  5:00 AM      Component Value Range Comment   WBC 4.1  4.0 - 10.5 (K/uL)    RBC 3.20 (*) 4.22 - 5.81 (MIL/uL)    Hemoglobin 9.0 (*) 13.0 - 17.0 (g/dL)    HCT 27.7 (*) 39.0 - 52.0 (%)    MCV 86.6  78.0 - 100.0 (  fL)    MCH 28.1  26.0 - 34.0 (pg)    MCHC 32.5  30.0 - 36.0 (g/dL)    RDW 15.1  11.5 - 15.5 (%)    Platelets 112 (*) 150 - 400 (K/uL) CONSISTENT WITH PREVIOUS RESULT  BASIC METABOLIC PANEL     Status: Abnormal   Collection Time   03/25/12  5:00 AM      Component Value Range Comment   Sodium 140  135 - 145 (mEq/L)    Potassium 3.0 (*) 3.5 - 5.1 (mEq/L)    Chloride 100  96 - 112 (mEq/L)    CO2 30  19 - 32 (mEq/L)    Glucose, Bld 105 (*) 70 - 99 (mg/dL)    BUN 30 (*) 6 - 23 (mg/dL)    Creatinine, Ser 1.81 (*) 0.50 - 1.35 (mg/dL)    Calcium 8.5  8.4 - 10.5 (mg/dL)    GFR calc non Af Amer 37 (*) >90 (mL/min)    GFR calc Af Amer 43 (*) >90 (mL/min)    No results found for this or any previous visit (from the past 240 hour(s)). Creatinine:  Basename 03/25/12 0500 03/24/12 0550 03/22/12 0610 03/21/12 0530  CREATININE 1.81* 1.87* 2.03* 1.80*   Baseline Creatinine:   Impression/Assessment:  Urinary retention probably multifactorial. I suspect the patient has had some long-standing outlet obstruction from BPH and may indeed have some component of a hypotonic bladder given his poor sensation and substantial postvoid residual. He will continue on tamsulosin. I recommend leaving the Foley cath the distal time. He will have periodic bladder scans and should have continued I. if more than 250 cc as noted in his bladder. I will review his records from our office. I am encouraged that he did void 500 cc this afternoon. Given that he has had multiple catheters I. he should have a urine culture  performed prior to discharge. I don't think the Valium that he is taking out will result in any significant voiding dysfunction and if anything may actually be helping to lack some of his pelvic floor muscles and external sphincter and could potentially a voiding.  Plan:  As above.  Derek [name] S 03/25/2012, 4:07 PM

## 2012-03-25 NOTE — Progress Notes (Signed)
Patient voided 500cc spontaneously. Bladder scan held at the moment. Will continue to monitor and bladder scan if needed in 5-6 hours. Derek [name]

## 2012-03-25 NOTE — Progress Notes (Signed)
Subjective: No new issues.  Objective: Vital signs in last 24 hours: Temp:  [97.5 F (36.4 C)-99 F (37.2 C)] 98.4 F (36.9 C) (04/13 0557) Pulse Rate:  [75-77] 77  (04/13 0557) Resp:  [16-20] 20  (04/13 0557) BP: (104-140)/(60-86) 104/63 mmHg (04/13 0557) SpO2:  [91 %-97 %] 96 % (04/13 0830) Weight change:  Last BM Date: 03/23/12  Intake/Output from previous day: 04/12 0701 - 04/13 0700 In: -  Out: 1300 [Urine:1300]     Physical Exam: General: Sleeping comfortably, alert, communicative, not short of breath at rest. Saturating at between 93%-96% on room air. HEENT: Mild clinical pallor, no jaundice, no conjunctival injection or discharge. Hydration status appears fair.  NECK: Supple, JVP not seen, no carotid bruits, no palpable lymphadenopathy, no palpable goiter.  CHEST: Clinically clear to auscultation, no wheezes, no crackles.  HEART: Sounds 1 and 2 heard, normal, regular,bradycardic no murmurs.  ABDOMEN: Full, soft, non-tender, no palpable organomegaly, no palpable masses, normal bowel sounds.  GENITALIA: Not examined.  LOWER EXTREMITIES: No pitting edema, palpable peripheral pulses, RLE. LLE is midly edematous at the hip, and dressings are dry.  MUSCULOSKELETAL SYSTEM: Generalized osteoarthritic changes.  CENTRAL NERVOUS SYSTEM: No focal neurologic deficit on gross examination.    Lab Results:  Basename 03/25/12 0500 03/24/12 0900  WBC 4.1 4.2  HGB 9.0* 9.3*  HCT 27.7* 28.9*  PLT 112* 109*    Basename 03/25/12 0500 03/24/12 0550  NA 140 139  K 3.0* 3.2*  CL 100 101  CO2 30 29  GLUCOSE 105* 112*  BUN 30* 33*  CREATININE 1.81* 1.87*  CALCIUM 8.5 8.6   Recent Results (from the past 240 hour(s))  SURGICAL PCR SCREEN     Status: Normal   Collection Time   03/15/12  2:20 PM      Component Value Range Status Comment   MRSA, PCR NEGATIVE  NEGATIVE  Final    Staphylococcus aureus NEGATIVE  NEGATIVE  Final      Studies/Results: Dg Chest 1 View  03/24/2012   *RADIOLOGY REPORT*  Clinical Data: 66 year old male with cough, hypoxia.  History of cardiac stent.  CHEST - 1 VIEW  Comparison: 03/22/2012 and earlier.  Findings: AP view at 1340 hours.  Slightly improved lung volumes. Cardiac size and mediastinal contours are within normal limits. Allowing for portable technique, the lungs are clear.  No pneumothorax. Visualized tracheal air column is within normal limits.  IMPRESSION: No acute cardiopulmonary abnormality.  Original Report Authenticated By: Randall An, M.D.   Nm Pulmonary Per & Vent  03/24/2012  *RADIOLOGY REPORT*  Clinical Data: Oxygen desaturation.  Status post total hip replacement.  NM PULMONARY VENTILATION AND PERFUSION SCAN  Radiopharmaceutical: 3MILLI CURIE MAA TECHNETIUM TO 27M ALBUMIN AGGREGATED, 18MILLI CURIE xenon xe 133 gas 18 milli Curie XENON XE 133 GAS  Comparison: Chest x-ray 03/24/2012.  Findings: The perfusion lung scan demonstrates patchy ill-defined perfusion abnormalities.  No segmental or subsegmental defects are seen.  The ventilation scan is similar.  IMPRESSION: Low probability ventilation perfusion lung scan for pulmonary embolism.  Original Report Authenticated By: P. Kalman Jewels, M.D.    Medications: Scheduled Meds:   . allopurinol  300 mg Oral Daily  . buPROPion  100 mg Oral BID  . diazepam  10 mg Oral Q12H  . docusate sodium  100 mg Oral BID  . furosemide  40 mg Oral Daily  . metoprolol tartrate  25 mg Oral BID  . omega-3 acid ethyl esters  1 g Oral  BID  . pantoprazole  40 mg Oral Q1200  . rivaroxaban  10 mg Oral Q breakfast  . simvastatin  40 mg Oral q1800  . Tamsulosin HCl  0.4 mg Oral Daily  . DISCONTD: albuterol  2.5 mg Nebulization TID  . DISCONTD: ipratropium  0.5 mg Nebulization TID   Continuous Infusions:   . DISCONTD: dextrose 5 % and 0.9 % NaCl with KCl 20 mEq/L     PRN Meds:.acetaminophen, acetaminophen, albuterol, alum & mag hydroxide-simeth, diphenhydrAMINE, HYDROcodone-acetaminophen,  HYDROmorphone, menthol-cetylpyridinium, metoCLOPramide (REGLAN) injection, nitroGLYCERIN, ondansetron (ZOFRAN) IV, ondansetron, phenol, polyethylene glycol, sorbitol, technetium albumin aggregated, traMADol, xenon xe 133, zolpidem, DISCONTD: methocarbamol (ROBAXIN) IV, DISCONTD: methocarbamol, DISCONTD: oxyCODONE-acetaminophen  Assessment/Plan: Principal Problem:  *Post-traumatic osteoarthritis of left hip/Fractured pins: Patient is status post hardware removal/left hip arthroplasty on 03/20/12. Now POD# 5. Appears clinically stable from orthopedic viwepont, and not in acute pain. Manage per primary.  Active Problems:  1. Hypoxia: Patient was reportedly desaturating on 03/22/12, but responded to oxygen supplementation, and CXR was devoid of active disease at that time. Also, benzodiazepines and opioid analgesics have been reduced. He is currently on incentive spirometry. He denies cough or chest pain, and is not dyspneic at rest. Currently, he is saturating at 93%-96% on room air. Clearly hypoxia has resolved. Repeat CXR of 03/24/12, showed no acute findings, and V/OQ scan was reported as low probability.  I suspect the culprit must have been atelectasis, against a background of recent surgery, opioid analgesics and benzodiazepines. 2. Altered mental status: Likely post-op delirium, due to hypoxia and opioids. Urinalysis is negative, as was CXR of 03/24/12 and wcc is normal. I would recommend changing opioid analgesic to Tramadol, which is quite effective, but less likely to cause confusion. 3. Anemia: ABLA on chronic. Hemoglobin appears reasonable at this time.  4. Chronic thrombocytopenia: This is stable.  5. CAD: Asymptomatic.  6. CKD-3: Patient has solitary functioning left kidney, and atrophic right kidney. Baseline creatinine is 1.97, as at 06/2009. Creatinine appears at baseline, at this time. Avoid nephrotoxins.  7. Prostatism: patient went into urinary retention, after Foley's catheter was removed. He  is currently on Flomax, and is awaiting bladder scan, with a view to re-inserting catheter, if residual is significant. I understand Dr Louanne Skye has discussed with urologist. Patient is constipated, and this may be contributory. We shall adjust laxatives.  Comment: Thanks for the consultation. Medical team has signed off today. Please discontinue nebulizers at the time of discharge.    LOS: 5 days   Errik Mitchelle,CHRISTOPHER 03/25/2012, 1:17 PM

## 2012-03-26 LAB — BASIC METABOLIC PANEL
BUN: 29 mg/dL — ABNORMAL HIGH (ref 6–23)
CO2: 28 mEq/L (ref 19–32)
Calcium: 8.6 mg/dL (ref 8.4–10.5)
Chloride: 104 mEq/L (ref 96–112)
Creatinine, Ser: 1.74 mg/dL — ABNORMAL HIGH (ref 0.50–1.35)
GFR calc Af Amer: 45 mL/min — ABNORMAL LOW (ref >90)
GFR calc non Af Amer: 39 mL/min — ABNORMAL LOW (ref >90)
Glucose, Bld: 110 mg/dL — ABNORMAL HIGH (ref 70–99)
Potassium: 3.8 mEq/L (ref 3.5–5.1)
Sodium: 141 mEq/L (ref 135–145)

## 2012-03-26 LAB — CBC
HCT: 29.4 % — ABNORMAL LOW (ref 39.0–52.0)
Hemoglobin: 9.5 g/dL — ABNORMAL LOW (ref 13.0–17.0)
MCH: 28.1 pg (ref 26.0–34.0)
MCHC: 32.3 g/dL (ref 30.0–36.0)
MCV: 87 fL (ref 78.0–100.0)
Platelets: 131 10*3/uL — ABNORMAL LOW (ref 150–400)
RBC: 3.38 MIL/uL — ABNORMAL LOW (ref 4.22–5.81)
RDW: 15.4 % (ref 11.5–15.5)
WBC: 4.8 10*3/uL (ref 4.0–10.5)

## 2012-03-26 NOTE — Progress Notes (Signed)
Subjective: 6 Days Post-Op Procedure(s) (LRB): TOTAL HIP ARTHROPLASTY (Left) HARDWARE REMOVAL (Left) Awake, alert and Oriented x 4. Difficulty voiding through out hospital course. Now intermittent catheterization 500cc post void residual. Patient reports pain as mild.    Objective:   VITALS:  Temp:  [98.3 F (36.8 C)-98.8 F (37.1 C)] 98.7 F (37.1 C) (04/14 0542) Pulse Rate:  [73-85] 73  (04/14 0542) Resp:  [18] 18  (04/14 0542) BP: (120-150)/(62-75) 130/64 mmHg (04/14 0542) SpO2:  [92 %-98 %] 92 % (04/14 0542)  Neurologically intact ABD soft Neurovascular intact Sensation intact distally Intact pulses distally Dorsiflexion/Plantar flexion intact Incision: dressing C/D/I and no drainage No cellulitis present Compartment soft   LABS  Basename 03/26/12 0500 03/25/12 0500 03/24/12 0900  HGB 9.5* 9.0* 9.3*  WBC 4.8 4.1 --  PLT 131* 112* --    Basename 03/26/12 0500 03/25/12 0500  NA 141 140  K 3.8 3.0*  CL 104 100  CO2 28 30  BUN 29* 30*  CREATININE 1.74* 1.81*  GLUCOSE 110* 105*   No results found for this basename: LABPT:2,INR:2 in the last 72 hours   Assessment/Plan: 6 Days Post-Op Procedure(s) (LRB): TOTAL HIP ARTHROPLASTY (Left) HARDWARE REMOVAL (Left)  Advance diet Up with therapy D/C IV fluids Plan for discharge tomorrow Discharge home with home health  Ronald Vinsant E 03/26/2012, 11:39 AM

## 2012-03-26 NOTE — Progress Notes (Signed)
Physical Therapy Treatment Patient Details Name: SHAKI LADEWIG MRN: [id number] DOB: 1946/10/08 Today's Date: 03/26/2012  PT Assessment/Plan  PT - Assessment/Plan Comments on Treatment Session: Pt continues to demo good progress.   PT Plan: Frequency remains appropriate PT Frequency: 7X/week Follow Up Recommendations: Home health PT;Supervision/Assistance - 24 hour Equipment Recommended: Rolling walker with 5" wheels;3 in 1 bedside comode PT Goals  Acute Rehab PT Goals PT Goal: Supine/Side to Sit - Progress: Progressing toward goal PT Goal: Sit to Supine/Side - Progress: Progressing toward goal PT Goal: Sit to Stand - Progress: Progressing toward goal PT Goal: Ambulate - Progress: Progressing toward goal PT Goal: Up/Down Stairs - Progress: Progressing toward goal PT Goal: Perform Home Exercise Program - Progress: Progressing toward goal Additional Goals PT Goal: Additional Goal #1 - Progress: Progressing toward goal  PT Treatment Precautions/Restrictions  Precautions Precautions: Posterior Hip Precaution Booklet Issued: Yes (comment) Precaution Comments: pt independently recalls 3/3 hip precautiions, but does not adhere to them during Rx Required Braces or Orthoses DO NOT USE: No Restrictions Weight Bearing Restrictions: Yes LLE Weight Bearing: Weight bearing as tolerated Mobility (including Balance) Bed Mobility Supine to Sit: 4: Min assist;With rails;HOB flat Supine to Sit Details (indicate cue type and reason): asssit with LLE and trunk, verbal cues for sequencing Transfers Sit to Stand: 5: Supervision;From bed;With upper extremity assist Sit to Stand Details (indicate cue type and reason): increased time required, verbal cues for hip precautions Stand to Sit: 5: Supervision;To chair/3-in-1;With armrests Stand to Sit Details: verbal cues for sequencing Ambulation/Gait Ambulation/Gait Assistance: 4: Min assist Ambulation/Gait Assistance Details (indicate cue type and  reason): min guard assist, verbal cues for hip precautions with pivoting to L Ambulation Distance (Feet): 110 Feet Assistive device: Rolling walker Gait Pattern: Step-to pattern;Decreased stance time - left    Exercise  Total Joint Exercises Ankle Circles/Pumps: AROM;Both;10 reps;Supine Quad Sets: AROM;Both;10 reps;Supine Gluteal Sets: AROM;Both;10 reps;Supine Heel Slides: AAROM;Left;10 reps;Supine Hip ABduction/ADduction: AAROM;Left;10 reps;Supine End of Session PT - End of Session Equipment Utilized During Treatment: Gait belt Activity Tolerance: Patient tolerated treatment well Patient left: in chair;with call bell in reach Nurse Communication: Mobility status for transfers;Mobility status for ambulation General Behavior During Session: [facility] for tasks performed Cognition: [facility] for tasks performed  Lorriane Shire 03/26/2012, 10:06 AM  Lorrin Goodell, PT  Office # [phone] Pager [phone]

## 2012-03-26 NOTE — Progress Notes (Signed)
Patient ID: Derek [name], male   DOB: Aug 12, 1946, 66 y.o.   MRN: [id number] 6 Days Post-Op Subjective: Patient reports no spontaneous voiding since his voiding approximately 24 hours ago. This was just catheterized for a 500 cc residual. He has no new complaints today. He is otherwise made good satisfactory postoperative recovery and potential he will go home tomorrow morning.  Objective: Vital signs in last 24 hours: Temp:  [98.3 F (36.8 C)-98.8 F (37.1 C)] 98.7 F (37.1 C) (04/14 0542) Pulse Rate:  [73-85] 73  (04/14 0542) Resp:  [18] 18  (04/14 0542) BP: (120-150)/(62-75) 130/64 mmHg (04/14 0542) SpO2:  [92 %-98 %] 92 % (04/14 0542)  Intake/Output from previous day: 04/13 0701 - 04/14 0700 In: 720 [P.O.:720] Out: 500 [Urine:500] Intake/Output this shift: Total I/O In: -  Out: 500 [Urine:500]  Physical Exam:  Constitutional: Vital signs reviewed. WD WN in NAD   Pulmonary/Chest: Normal effort Abdominal: Soft. Non-tender, non-distended, bowel sounds are normal, no masses, organomegaly, or guarding present.  Genitourinary:no change Extremities: No cyanosis or edema   Lab Results:  Basename 03/26/12 0500 03/25/12 0500 03/24/12 0900  HGB 9.5* 9.0* 9.3*  HCT 29.4* 27.7* 28.9*   BMET  Basename 03/26/12 0500 03/25/12 0500  NA 141 140  K 3.8 3.0*  CL 104 100  CO2 28 30  GLUCOSE 110* 105*  BUN 29* 30*  CREATININE 1.74* 1.81*  CALCIUM 8.6 8.5   No results found for this basename: LABPT:3,INR:3 in the last 72 hours No results found for this basename: LABURIN:1 in the last 72 hours Results for orders placed during the hospital encounter of 03/15/12  SURGICAL PCR SCREEN     Status: Normal   Collection Time   03/15/12  2:20 PM      Component Value Range Status Comment   MRSA, PCR NEGATIVE  NEGATIVE  Final    Staphylococcus aureus NEGATIVE  NEGATIVE  Final     Studies/Results: Dg Chest 1 View  03/24/2012  *RADIOLOGY REPORT*  Clinical Data: 66 year old male with  cough, hypoxia.  History of cardiac stent.  CHEST - 1 VIEW  Comparison: 03/22/2012 and earlier.  Findings: AP view at 1340 hours.  Slightly improved lung volumes. Cardiac size and mediastinal contours are within normal limits. Allowing for portable technique, the lungs are clear.  No pneumothorax. Visualized tracheal air column is within normal limits.  IMPRESSION: No acute cardiopulmonary abnormality.  Original Report Authenticated By: Randall An, M.D.   Nm Pulmonary Per & Vent  03/24/2012  *RADIOLOGY REPORT*  Clinical Data: Oxygen desaturation.  Status post total hip replacement.  NM PULMONARY VENTILATION AND PERFUSION SCAN  Radiopharmaceutical: 3MILLI CURIE MAA TECHNETIUM TO 77M ALBUMIN AGGREGATED, 18MILLI CURIE xenon xe 133 gas 18 milli Curie XENON XE 133 GAS  Comparison: Chest x-ray 03/24/2012.  Findings: The perfusion lung scan demonstrates patchy ill-defined perfusion abnormalities.  No segmental or subsegmental defects are seen.  The ventilation scan is similar.  IMPRESSION: Low probability ventilation perfusion lung scan for pulmonary embolism.  Original Report Authenticated By: P. Kalman Jewels, M.D.    Assessment/Plan:   Ongoing urinary retention. I suspect pulse absent long-standing outlet obstruction which is worsened. If he is ready for discharge by in the morning that it would be reasonable to place a 16 French Foley catheter and leave that indwelling for 5-7 days. He can then follow up in our office for a voiding trial. If he continues to be unable to void we will assess him further with  flexible cystoscopy high and potential urodynamics to try to determine how much of this outlet obstruction versus bladder dysfunction.   LOS: 6 days   Jalexus Brett S 03/26/2012, 11:35 AM

## 2012-03-26 NOTE — Progress Notes (Signed)
CSW received consult for SNF. Pt for home with [facility], per MD note. No other CSW needs reported or noted. CSW signing off. Please re-consult if SNF needed. Wandra Feinstein, MSW, [place] (weekend)

## 2012-03-27 DIAGNOSIS — R339 Retention of urine, unspecified: Secondary | ICD-10-CM | POA: Clinically undetermined

## 2012-03-27 MED ORDER — TRAMADOL HCL 50 MG PO TABS: 25.0000 mg | ORAL_TABLET | Freq: Four times a day (QID) | ORAL | Status: DC | PRN

## 2012-03-27 MED ORDER — TAMSULOSIN HCL 0.4 MG PO CAPS: 0.4000 mg | ORAL_CAPSULE | Freq: Every day | ORAL | Status: DC

## 2012-03-27 MED ORDER — RIVAROXABAN 10 MG PO TABS: 10.0000 mg | ORAL_TABLET | Freq: Every day | ORAL | Status: DC

## 2012-03-27 NOTE — Progress Notes (Signed)
Physical Therapy Treatment Note   03/27/12 0840  PT Visit Information  Last PT Received On 03/27/12  Precautions  Precautions Posterior Hip  Precaution Comments pt able to recall 3/3 precautions and has improved on adhering to them during transfers and ambulation  Restrictions  LLE Weight Bearing WBAT  Bed Mobility  Bed Mobility (Pt recieved sitting up in chair.)  Transfers  Sit to Stand 5: Supervision  Stand to Sit 5: Supervision  Ambulation/Gait  Ambulation/Gait Assistance 5: Supervision  Ambulation Distance (Feet) 120 Feet (with seated rest break of 5 min 1/2 way)  Assistive device Rolling walker  Gait Pattern Step-to pattern;Decreased step length - left;Decreased stance time - left  Gait velocity slow but WFL for surgery  Stairs Yes  Stairs Assistance 4: Min assist (contact guard)  Stairs Assistance Details (indicate cue type and reason) pt able to demonstrate good/safe technique  Stair Management Technique No rails;Forwards;With walker  Number of Stairs 1   Total Joint Exercises  Hip ABduction/ADduction AROM;Left;10 reps;Standing  Long [employer] AROM;Strengthening;10 reps;Seated;Left  Marching in Standing AROM;Left;10 reps;Standing  Standing Hip Extension AROM;Left;10 reps;Standing  PT - End of Session  Equipment Utilized During Treatment Gait belt  Activity Tolerance Patient tolerated treatment well  Patient left in chair;with call bell in reach  Nurse Communication Mobility status for transfers;Mobility status for ambulation  General  Behavior During Session [facility] for tasks performed  Cognition [facility] for tasks performed  PT - Assessment/Plan  Comments on Treatment Session Patient con't to demo good progression and now only currently requires supervision/guard for all mobiltiy. Patient reports 0/10 pain at this time. Patient safe to return home with 24/7 assist.  PT Plan Frequency remains appropriate;Discharge plan remains appropriate  PT Frequency 7X/week  Follow Up  Recommendations Home health PT;Supervision/Assistance - 24 hour  Equipment Recommended Rolling walker with 5" wheels;3 in 1 bedside comode  Acute Rehab PT Goals  PT Goal: Sit to Stand - Progress Progressing toward goal  PT Goal: Ambulate - Progress Progressing toward goal  PT Goal: Up/Down Stairs - Progress Progressing toward goal  PT Goal: Perform Home Exercise Program - Progress Progressing toward goal    Pain: 0/10 pain in L hip  Kittie Plater, PT, DPT Pager #: [phone] Office #: [phone]

## 2012-03-27 NOTE — Progress Notes (Signed)
Subjective: 7 Days Post-Op Procedure(s) (LRB): TOTAL HIP ARTHROPLASTY (Left) HARDWARE REMOVAL (Left) Passing his water on his own. Mild to moderate pain. Wants to go home.  Patient reports pain as mild.    Objective:   VITALS:  Temp:  [98 F (36.7 C)-100.3 F (37.9 C)] 98.3 F (36.8 C) (04/15 0601) Pulse Rate:  [66-74] 68  (04/15 0601) Resp:  [18-20] 18  (04/15 0601) BP: (114-130)/(56-67) 114/67 mmHg (04/15 0601) SpO2:  [92 %-95 %] 92 % (04/15 0601)  Neurologically intact ABD soft Neurovascular intact Sensation intact distally Intact pulses distally Dorsiflexion/Plantar flexion intact Incision: dressing C/D/I Alert Oriented x 4.  LABS  Basename 03/26/12 0500 03/25/12 0500 03/24/12 0900  HGB 9.5* 9.0* 9.3*  WBC 4.8 4.1 --  PLT 131* 112* --    Basename 03/26/12 0500 03/25/12 0500  NA 141 140  K 3.8 3.0*  CL 104 100  CO2 28 30  BUN 29* 30*  CREATININE 1.74* 1.81*  GLUCOSE 110* 105*   No results found for this basename: LABPT:2,INR:2 in the last 72 hours   Assessment/Plan: 7 Days Post-Op Procedure(s) (LRB): TOTAL HIP ARTHROPLASTY (Left) HARDWARE REMOVAL (Left)  Discharge home with home health Will need to carefully monitor voiding at home and stay with Tramadol for pain.  Bon Dowis E 03/27/2012, 7:59 AM

## 2012-03-27 NOTE — Progress Notes (Signed)
Occupational Therapy Treatment Patient Details Name: Derek [name] MRN: [id number] DOB: 1946/01/28 Today's Date: 03/27/2012  OT Assessment/Plan OT Assessment/Plan Comments on Treatment Session: Pt. progressing very well this session OT Plan: Discharge plan remains appropriate OT Frequency: Min 2X/week Follow Up Recommendations: Home health OT;Supervision - Intermittent Equipment Recommended: Rolling walker with 5" wheels;3 in 1 bedside comode OT Goals Acute Rehab OT Goals OT Goal Formulation: With patient Time For Goal Achievement: 7 days ADL Goals Pt Will Perform Tub/Shower Transfer: with supervision;Ambulation;with DME;Tub transfer;Transfer tub bench ADL Goal: Tub/Shower Transfer - Progress: Progressing toward goals Additional ADL Goal #1: Pt. will recall 3 hip precautions ADL Goal: Additional Goal #1 - Progress: Met  OT Treatment Precautions/Restrictions  Precautions Precautions: Posterior Hip Precaution Comments: pt independently recalls 3/3 hip precautiions, but does not adhere to them during Rx Restrictions Weight Bearing Restrictions: Yes LLE Weight Bearing: Weight bearing as tolerated   ADL ADL Tub/Shower Transfer: Simulated;Minimal assistance Tub/Shower Transfer Details (indicate cue type and reason): with lifting left LE over wall of tub Tub/Shower Transfer Method: Ambulating Tub/Shower Transfer Equipment: Transfer tub bench;Grab bars Equipment Used: Rolling walker Ambulation Related to ADLs: Pt. supervision ~100' with RW ADL Comments: Pt. provided with mod verbal cues for transfer technique to maintain hip precautions Mobility  Transfers Sit to Stand: 5: Supervision;From bed;With upper extremity assist Stand to Sit: 5: Supervision;To chair/3-in-1;With armrests     End of Session OT - End of Session Equipment Utilized During Treatment: Gait belt Activity Tolerance: Patient tolerated treatment well Patient left: in chair;with call bell in reach Nurse  Communication: Mobility status for transfers General Behavior During Session: [facility] for tasks performed Cognition: [facility] for tasks performed  Nicola Heinemann, OTR/L Pager [phone]  03/27/2012, 10:19 AM

## 2012-03-27 NOTE — Discharge Summary (Signed)
Patient examined and D/C summary reviewed prior to discharge.

## 2012-03-27 NOTE — Discharge Summary (Signed)
Physician Discharge Summary  Patient ID: Derek [name] MRN: [id number] DOB/AGE: [id number] y.o.  Admit date: 03/20/2012 Discharge date: 03/27/2012  Admission Diagnoses:  Post-traumatic osteoarthritis of left hip  Discharge Diagnoses:  Principal Problem:  *Post-traumatic osteoarthritis of left hip Active Problems:  Hypoxia  Altered mental status  Urinary retention with incomplete bladder emptying   Past Medical History  Diagnosis Date  . Chronic idiopathic thrombocytopenia   . History of intermittent claudication   . HLD (hyperlipidemia)     mixed  . Coronary atherosclerosis of native coronary vessel   . Edema   . Myocardial infarction   . Angina     QUESTIONABLE  PULLING  BEHIND RIBS LEFT SIDE X 5 MONTHS   . Hypertension   . Peripheral vascular disease   . Chronic renal insufficiency     1 KIDNEY   . GERD (gastroesophageal reflux disease)   . Arthritis   . Depression   . Anxiety     Surgeries: Procedure(s): TOTAL HIP ARTHROPLASTY HARDWARE REMOVAL on 03/20/2012   Consultants (if any): Treatment Team:  Bernestine Amass, MD  Discharged Condition: Improved  Hospital Course: Derek [name] is an 66 y.o. male who was admitted 03/20/2012 with a diagnosis of Post-traumatic osteoarthritis of left hip and went to the operating room on 03/20/2012 and underwent the above named procedures.    He was given perioperative antibiotics:  Anti-infectives     Start     Dose/Rate Route Frequency Ordered Stop   03/20/12 1450   ceFAZolin (ANCEF) IVPB 1 g/50 mL premix        1 g 100 mL/hr over 30 Minutes Intravenous Every 6 hours 03/20/12 1409 03/21/12 0330   03/19/12 1316   ceFAZolin (ANCEF) IVPB 2 g/50 mL premix        2 g 100 mL/hr over 30 Minutes Intravenous 60 min pre-op 03/19/12 1316 03/20/12 0759        Patient developed urinary retention requiring prolonged foley catheterization and initiation of Flomax.  Pt evaluated by Dr. Risa Grill.  Voiding trial eventually showed pt  able to void independently.   Mild mental status changes felt to be related to narcotics and possibly withdrawal from chronic Valium use.  After restarting valium,which is used routinely at home for anxiety, his mental status returned to baseline. Progressed well with PT.  Stable for discharge to home with DME and HHPT  He was given sequential compression devices, early ambulation, and chemoprophylaxis for DVT prophylaxis.  He benefited maximally from the hospital stay and there were no complications.    Recent vital signs:  Filed Vitals:   03/27/12 0601  BP: 114/67  Pulse: 68  Temp: 98.3 F (36.8 C)  Resp: 18    Recent laboratory studies:  Lab Results  Component Value Date   HGB 9.5* 03/26/2012   HGB 9.0* 03/25/2012   HGB 9.3* 03/24/2012   Lab Results  Component Value Date   WBC 4.8 03/26/2012   PLT 131* 03/26/2012   Lab Results  Component Value Date   INR 1.01 03/15/2012   Lab Results  Component Value Date   NA 141 03/26/2012   K 3.8 03/26/2012   CL 104 03/26/2012   CO2 28 03/26/2012   BUN 29* 03/26/2012   CREATININE 1.74* 03/26/2012   GLUCOSE 110* 03/26/2012    Discharge Medications:   Medication List  As of 03/27/2012  8:37 AM   STOP taking these medications         aspirin  EC 81 MG tablet         TAKE these medications         allopurinol 300 MG tablet   Commonly known as: ZYLOPRIM   Take 300 mg by mouth daily.      buPROPion 100 MG 12 hr tablet   Commonly known as: WELLBUTRIN SR   Take 100 mg by mouth 2 (two) times daily.      diazepam 10 MG tablet   Commonly known as: VALIUM   Take 10 mg by mouth every 12 (twelve) hours as needed.      FISH OIL MAXIMUM STRENGTH 1200 MG Caps   Take 1 capsule by mouth daily.      furosemide 80 MG tablet   Commonly known as: LASIX   Take 40 mg by mouth daily.      metoprolol tartrate 25 MG tablet   Commonly known as: LOPRESSOR   Take 25 mg by mouth 2 (two) times daily.      nitroGLYCERIN 0.4 MG SL tablet   Commonly  known as: NITROSTAT   Place 0.4 mg under the tongue every 5 (five) minutes as needed. For chest pain      pravastatin 80 MG tablet   Commonly known as: PRAVACHOL   Take 80 mg by mouth daily.      PRILOSEC 20 MG capsule   Generic drug: omeprazole   Take 20 mg by mouth daily.      rivaroxaban 10 MG Tabs tablet   Commonly known as: XARELTO   Take 1 tablet (10 mg total) by mouth daily with breakfast.      Tamsulosin HCl 0.4 MG Caps   Commonly known as: FLOMAX   Take 1 capsule (0.4 mg total) by mouth daily.      traMADol 50 MG tablet   Commonly known as: ULTRAM   Take 0.5 tablets (25 mg total) by mouth every 6 (six) hours as needed for pain.            Diagnostic Studies: Dg Chest 1 View  03/24/2012  *RADIOLOGY REPORT*  Clinical Data: 66 year old male with cough, hypoxia.  History of cardiac stent.  CHEST - 1 VIEW  Comparison: 03/22/2012 and earlier.  Findings: AP view at 1340 hours.  Slightly improved lung volumes. Cardiac size and mediastinal contours are within normal limits. Allowing for portable technique, the lungs are clear.  No pneumothorax. Visualized tracheal air column is within normal limits.  IMPRESSION: No acute cardiopulmonary abnormality.  Original Report Authenticated By: Randall An, M.D.   Dg Chest 2 View  03/15/2012  *RADIOLOGY REPORT*  Clinical Data: 66 year old male with left hip osteoarthritis. Hypertension, hyperlipidemia, coronary atherosclerosis, myocardial infarction.  CHEST - 2 VIEW  Comparison: 05/27/2004.  Findings: Stable to slightly increased cardiac silhouette. Other mediastinal contours are within normal limits.  No pneumothorax, pulmonary edema, pleural effusion or confluent pulmonary opacity. Visualized tracheal air column is within normal limits.  No acute osseous abnormality identified.  IMPRESSION: No acute cardiopulmonary abnormality.  Original Report Authenticated By: Randall An, M.D.   Dg Pelvis Portable  03/20/2012  *RADIOLOGY REPORT*   Clinical Data: Postop for hip arthroplasty.  PORTABLE PELVIS  Comparison: CT of 06/28/2002.  Findings: Interval left hip arthroplasty.  Mild right hip osteoarthritis. No acute hardware complication.  No acute fracture. Degenerative changes of the symphysis pubis.  Drain projecting over the left hip.  IMPRESSION: Expected appearance after left hip arthroplasty.  Original Report Authenticated By: Areta Haber, M.D.  Nm Pulmonary Per & Vent  03/24/2012  *RADIOLOGY REPORT*  Clinical Data: Oxygen desaturation.  Status post total hip replacement.  NM PULMONARY VENTILATION AND PERFUSION SCAN  Radiopharmaceutical: 3MILLI CURIE MAA TECHNETIUM TO 84M ALBUMIN AGGREGATED, 18MILLI CURIE xenon xe 133 gas 18 milli Curie XENON XE 133 GAS  Comparison: Chest x-ray 03/24/2012.  Findings: The perfusion lung scan demonstrates patchy ill-defined perfusion abnormalities.  No segmental or subsegmental defects are seen.  The ventilation scan is similar.  IMPRESSION: Low probability ventilation perfusion lung scan for pulmonary embolism.  Original Report Authenticated By: P. Kalman Jewels, M.D.   Dg Chest Port 1 View  03/22/2012  *RADIOLOGY REPORT*  Clinical Data: Hypoxia,  no chest pain  PORTABLE CHEST - 1 VIEW  Comparison:  03/15/2012  Findings: Cardiomediastinal silhouette is stable.  No acute infiltrate or pleural effusion.  No pulmonary edema.  Bony thorax is stable.  IMPRESSION: No active disease.  No significant change.  Original Report Authenticated By: Lahoma Crocker, M.D.   Dg Hip Portable 1 View Left  03/20/2012  *RADIOLOGY REPORT*  Clinical Data: Postop for left hip arthroplasty.  PORTABLE LEFT HIP - 1 VIEW  Comparison: Pelvic films same date.  Findings: Single lateral view of the left hip.  Left hip arthroplasty.  No periprosthetic fracture or other acute complication identified.  A nutrient foramen is identified within the posterior proximal femoral shaft.  IMPRESSION: Expected appearance after left hip arthroplasty.   Original Report Authenticated By: Areta Haber, M.D.    Disposition: 01-Home or Self Care  Discharge Orders    Future Orders Please Complete By Expires   Diet - low sodium heart healthy      Call MD / Call 911      Comments:   If you experience chest pain or shortness of breath, CALL 911 and be transported to the hospital emergency room.  If you develope a fever above 101 F, pus (white drainage) or increased drainage or redness at the wound, or calf pain, call your surgeon's office.   Constipation Prevention      Comments:   Drink plenty of fluids.  Prune juice may be helpful.  You may use a stool softener, such as Colace (over the counter) 100 mg twice a day.  Use MiraLax (over the counter) for constipation as needed.   Increase activity slowly as tolerated      Weight Bearing as taught in Physical Therapy      Comments:   Use a walker or crutches as instructed.   Discharge instructions      Comments:   Keep knee incision dry for 5 days post op then may wet while bathing. Therapy daily . Call if fever or chills or increased drainage. Go to ER if acutely short of breath or call for ambulance. Return for follow up in 2 weeks. May full weight bear on the surgical leg unless told otherwise. In house walking for first 2 weeks.  If difficulty with voiding call urologist [phone]. PT at home with home health.   Driving restrictions      Comments:   No driving for 6 weeks   Lifting restrictions      Comments:   No lifting for 4 weeks   Follow the hip precautions as taught in Physical Therapy      Change dressing      Comments:   You may go without your dressing on 03/27/2012. May bathe and wet incision, no rough scrubbing though.  Signed: Epimenio Foot 03/27/2012, 8:37 AM

## 2012-04-25 ENCOUNTER — Ambulatory Visit: Payer: Medicare Other | Admitting: Cardiology

## 2012-06-12 ENCOUNTER — Other Ambulatory Visit: Payer: Self-pay | Admitting: Cardiology

## 2012-06-23 ENCOUNTER — Telehealth: Payer: Self-pay | Admitting: *Deleted

## 2012-06-23 MED ORDER — ASPIRIN EC 81 MG PO TBEC: 81.0000 mg | DELAYED_RELEASE_TABLET | Freq: Every day | ORAL | Status: DC

## 2012-06-23 NOTE — Telephone Encounter (Signed)
Patient called back and said that he was talking with Dr. Lutricia Feil nurse and initially told her that he was taking Asprin 325 mg but he is taking Asprin 81 mg instead and wanted to clarify this information. I told him that I would get Dr. Lutricia Feil nurse to give him a call back for future details.

## 2012-06-23 NOTE — Telephone Encounter (Signed)
Message left on voice mail - going to [place] to dentist & having 7 teeth pulled at one time on 7/18.    Attempted to return call, left message.

## 2012-06-23 NOTE — Telephone Encounter (Signed)
Was on Xarelto 10mg  daily x 3 weeks only immediately following his hip surgery.  Now remain on ASA 81mg  daily.

## 2012-06-23 NOTE — Telephone Encounter (Signed)
Dr. Romilda Garret  Phone:  [phone]

## 2012-06-26 NOTE — Telephone Encounter (Signed)
I will defer to the patient's dentist, as to whether or not he can undergoing teeth extraction, while on ASA. The patient is otherwise cleared to stop this medication temporarily, from a cardiac standpoint, if needed.

## 2012-06-27 NOTE — Telephone Encounter (Signed)
Patient notified of below.  Patient states dentist has asked him to hold ASA starting Wednesday morning.  Will fax note to dentist.  [phone]

## 2012-09-20 ENCOUNTER — Ambulatory Visit (INDEPENDENT_AMBULATORY_CARE_PROVIDER_SITE_OTHER): Payer: Medicare Other | Admitting: Cardiology

## 2012-09-20 ENCOUNTER — Telehealth: Payer: Self-pay | Admitting: *Deleted

## 2012-09-20 ENCOUNTER — Encounter: Payer: Self-pay | Admitting: Cardiology

## 2012-09-20 VITALS — BP 136/75 | HR 60 | Ht 70.0 in | Wt 194.4 lb

## 2012-09-20 DIAGNOSIS — I10 Essential (primary) hypertension: Secondary | ICD-10-CM

## 2012-09-20 DIAGNOSIS — N183 Chronic kidney disease, stage 3 unspecified: Secondary | ICD-10-CM

## 2012-09-20 DIAGNOSIS — I2510 Atherosclerotic heart disease of native coronary artery without angina pectoris: Secondary | ICD-10-CM

## 2012-09-20 DIAGNOSIS — E785 Hyperlipidemia, unspecified: Secondary | ICD-10-CM

## 2012-09-20 NOTE — Assessment & Plan Note (Signed)
No changes to current regimen

## 2012-09-20 NOTE — Assessment & Plan Note (Signed)
On Pravachol. Keep followup with Dr. Luan Pulling. Goal LDL around 70.

## 2012-09-20 NOTE — Progress Notes (Signed)
Clinical Summary Derek [name] is a 66 y.o.male presenting for followup. He is a former patient of Dr. Dannielle Burn last seen in April. History was reviewed.  He reports no angina or NTG use. States that he has been exercising at the [facility] in [place] and feels well. No major claudication symptoms.  ECG today shows sinus rhythm without significant ST changes. Dr. Luan Pulling has been following lipids.   No Known Allergies  Current Outpatient Prescriptions  Medication Sig Dispense Refill  . allopurinol (ZYLOPRIM) 300 MG tablet Take 300 mg by mouth daily.        Marland Kitchen buPROPion (WELLBUTRIN SR) 100 MG 12 hr tablet Take 100 mg by mouth 2 (two) times daily.      . diazepam (VALIUM) 10 MG tablet Take 10 mg by mouth every 12 (twelve) hours as needed.       . furosemide (LASIX) 80 MG tablet Take 40 mg by mouth daily.       . metoprolol tartrate (LOPRESSOR) 25 MG tablet Take 25 mg by mouth 2 (two) times daily.      . nitroGLYCERIN (NITROSTAT) 0.4 MG SL tablet Place 0.4 mg under the tongue every 5 (five) minutes as needed. For chest pain      . Omega-3 Fatty Acids (FISH OIL MAXIMUM STRENGTH) 1200 MG CAPS Take 1 capsule by mouth daily.       Marland Kitchen omeprazole (PRILOSEC) 20 MG capsule Take 20 mg by mouth daily.        . pravastatin (PRAVACHOL) 80 MG tablet Take 80 mg by mouth daily.      . Tamsulosin HCl (FLOMAX) 0.4 MG CAPS Take 1 capsule (0.4 mg total) by mouth daily.  30 capsule  0  . traMADol (ULTRAM) 50 MG tablet Take 0.5 tablets (25 mg total) by mouth every 6 (six) hours as needed for pain.  60 tablet  2  . DISCONTD: metoprolol tartrate (LOPRESSOR) 25 MG tablet TAKE ONE TABLET BY MOUTH TWICE DAILY  60 tablet  6    Past Medical History  Diagnosis Date  . Chronic idiopathic thrombocytopenia   . Mixed hyperlipidemia   . Coronary atherosclerosis of native coronary vessel     DES RCA 2007  . Myocardial infarction   . Essential hypertension, benign   . Peripheral vascular disease   . Chronic renal  insufficiency     Single kidney  . GERD (gastroesophageal reflux disease)   . Arthritis   . Depression   . Anxiety     Social History Derek [name] reports that he quit smoking about 18 years ago. His smoking use included Cigarettes. He has a 105 pack-year smoking history. He has never used smokeless tobacco. Derek [name] reports that he does not drink alcohol.  Review of Systems No palpitations, no orthopnea. Has interval hip surgery since prior visit. Otherwise negative.  Physical Examination Filed Vitals:   09/20/12 1347  BP: 136/75  Pulse: 60   Filed Weights   09/20/12 1347  Weight: 194 lb 6.4 oz (88.179 kg)   No acute distress. HEENT: Conjunctiva and lids normal, oropharynx clear with moist mucosa. Neck: Supple, no elevated JVP or carotid bruits, no thyromegaly. Lungs: Clear to auscultation, nonlabored breathing at rest. Cardiac: Regular rate and rhythm, no S3 or significant systolic murmur, no pericardial rub. Abdomen: Soft, nontender, bowel sounds present, no guarding or rebound. Extremities: No pitting edema, distal pulses 1+.   Problem List and Plan   CAD, NATIVE VESSEL Stable on medical therapy. ECG reviewed today.  We will continue observation for now. Encouraged regular exercise. Discussed warming signs.  Essential hypertension, benign No changes to current regimen.  HYPERLIPIDEMIA-MIXED On Pravachol. Keep followup with Dr. Luan Pulling. Goal LDL around 70.  CKD (chronic kidney disease) stage 3, GFR 30-59 ml/min Creatinine ranging 1.5-2.0.    Satira Sark, M.D., F.A.C.C.

## 2012-09-20 NOTE — Assessment & Plan Note (Signed)
Stable on medical therapy. ECG reviewed today. We will continue observation for now. Encouraged regular exercise. Discussed warming signs.

## 2012-09-20 NOTE — Patient Instructions (Addendum)

## 2012-09-20 NOTE — Assessment & Plan Note (Signed)
Creatinine ranging 1.5-2.0.

## 2012-09-20 NOTE — Telephone Encounter (Signed)
Medications reviewed and updated per patient's request.

## 2012-11-22 ENCOUNTER — Other Ambulatory Visit (HOSPITAL_COMMUNITY): Payer: Self-pay | Admitting: Specialist

## 2012-11-27 ENCOUNTER — Encounter (HOSPITAL_COMMUNITY)
Admission: RE | Admit: 2012-11-27 | Discharge: 2012-11-27 | Disposition: A | Discharge status: Home / Self Care | Payer: Medicare Other | Source: Ambulatory Visit | Attending: Specialist | Admitting: Specialist

## 2012-11-27 ENCOUNTER — Encounter (HOSPITAL_COMMUNITY): Payer: Self-pay

## 2012-11-27 LAB — CBC
HCT: 47.1 % (ref 39.0–52.0)
Hemoglobin: 15.5 g/dL (ref 13.0–17.0)
MCH: 27.7 pg (ref 26.0–34.0)
MCHC: 32.9 g/dL (ref 30.0–36.0)
MCV: 84.3 fL (ref 78.0–100.0)
Platelets: 109 10*3/uL — ABNORMAL LOW (ref 150–400)
RBC: 5.59 MIL/uL (ref 4.22–5.81)
RDW: 15.4 % (ref 11.5–15.5)
WBC: 5.8 10*3/uL (ref 4.0–10.5)

## 2012-11-27 LAB — BASIC METABOLIC PANEL
BUN: 38 mg/dL — ABNORMAL HIGH (ref 6–23)
CO2: 29 mEq/L (ref 19–32)
Calcium: 9.9 mg/dL (ref 8.4–10.5)
Chloride: 101 mEq/L (ref 96–112)
Creatinine, Ser: 2.67 mg/dL — ABNORMAL HIGH (ref 0.50–1.35)
GFR calc Af Amer: 27 mL/min — ABNORMAL LOW (ref >90)
GFR calc non Af Amer: 23 mL/min — ABNORMAL LOW (ref >90)
Glucose, Bld: 87 mg/dL (ref 70–99)
Potassium: 5.1 mEq/L (ref 3.5–5.1)
Sodium: 139 mEq/L (ref 135–145)

## 2012-11-27 LAB — SURGICAL PCR SCREEN
MRSA, PCR: NEGATIVE
Staphylococcus aureus: NEGATIVE

## 2012-11-27 MED ORDER — CHLORHEXIDINE GLUCONATE 4 % EX LIQD: 60.0000 mL | Freq: Once | CUTANEOUS | Status: DC

## 2012-11-27 MED ORDER — DEXTROSE 5 % IV SOLN
3.0000 g | INTRAVENOUS | Status: DC
  Filled 2012-11-27: qty 3000

## 2012-11-27 MED ORDER — CEFAZOLIN SODIUM-DEXTROSE 2-3 GM-% IV SOLR
2.0000 g | INTRAVENOUS | Status: AC
  Administered 2012-11-28: 2 g via INTRAVENOUS
  Filled 2012-11-27: qty 50

## 2012-11-27 NOTE — Pre-Procedure Instructions (Signed)
[place]  11/27/2012   Your procedure is scheduled on:  11-28-2012  Report to [facility] at 7:50 AM. [place] to the 3rd floor  Call this number if you have problems the morning of surgery: [phone]   Remember:   Do not eat food 0r drink:After Midnight.    .  Take these medicines the morning of surgery with A SIP OF WATER: allopurinol(zyloprim),bupropion(Wllbutrin),Valium if needed,metoprolol(Lopression)omrprazole(Prilosec)   Do not wear jewelry, make-up or nail polish.  Do not wear lotions, powders, or perfumes.   Do not shave 48 hours prior to surgery. .  Do not bring valuables to the hospital.  Contacts, dentures or bridgework may not be worn into surgery.  Leave suitcase in the car. After surgery it may be brought to your room.   For patients admitted to the hospital, checkout time is 11:00 AM the day of discharge.   Patients discharged the day of surgery will not be allowed to drive home.   Name and phone number of your driver: __________________   Special Instructions: Shower with CHG tonight and in the morning.   Please read over the following fact sheets that you were given: Pain Booklet, MRSA Information and Surgical Site Infection Prevention

## 2012-11-27 NOTE — H&P (Signed)
Derek [name] is an 66 y.o. male.   Chief Complaint: left arm and hand numbness and tingling. HPI: Pt with with several months of progressive tingling and numbness of the left arm with pain at the elbow radiating into the left ulnar 2 digits as well as the thumb and index finger as well.  EMG/NCV studies indicate moderate nerve compression at both the left elbow and wrist with axonal changes involving both motor and sensory.  No involvement of the cervical spine. Pt wishes to proceed with surgical intervention.  Past Medical History  Diagnosis Date  . Mixed hyperlipidemia   . Essential hypertension, benign   . GERD (gastroesophageal reflux disease)   . Arthritis   . Depression   . Anxiety   . Coronary atherosclerosis of native coronary vessel     DES RCA 2007  . Myocardial infarction   . Peripheral vascular disease   . Chronic renal insufficiency     Single kidney  . Chronic idiopathic thrombocytopenia     Past Surgical History  Procedure Date  . Hernia repair   . Total hip arthroplasty   . Tonsillectomy   . Cardiac catheterization 2010    patent stent and nonobsturctive cad following an abnormal cardiolite study may of 2010 with an apical lateral defect  . Coronary angioplasty   . No past surgeries     1962 BROKE LT HIP WAS PINNED  . Total hip arthroplasty 03/20/2012    Procedure: TOTAL HIP ARTHROPLASTY;  Surgeon: Jessy Oto, MD;  Location: [place] [facility];  Service: Orthopedics;  Laterality: Left;  Left total hip replacement, ceramic on poly  . Hardware removal 03/20/2012    Procedure: HARDWARE REMOVAL;  Surgeon: Jessy Oto, MD;  Location: [facility];  Service: Orthopedics;  Laterality: Left;  Hardware removal 3 knowles pins left hip  . Joint replacement     left hip    Family History  Problem Relation Age of Onset  . Stroke Other   . Coronary artery disease Other    Social History:  reports that he quit smoking about 18 years ago. His smoking use included Cigarettes. He has a 105  pack-year smoking history. He has never used smokeless tobacco. He reports that he does not drink alcohol or use illicit drugs.  Allergies: No Known Allergies  Medications Prior to Admission  Medication Sig Dispense Refill  . allopurinol (ZYLOPRIM) 300 MG tablet Take 300 mg by mouth daily.        Marland Kitchen aspirin EC 81 MG tablet Take 81 mg by mouth daily.      Marland Kitchen buPROPion (WELLBUTRIN SR) 100 MG 12 hr tablet Take 100 mg by mouth 2 (two) times daily.      . diazepam (VALIUM) 10 MG tablet Take 10 mg by mouth every 12 (twelve) hours as needed. For spasms      . furosemide (LASIX) 80 MG tablet Take 40 mg by mouth daily.       . metoprolol tartrate (LOPRESSOR) 25 MG tablet Take 25 mg by mouth 2 (two) times daily.      . Omega-3 Fatty Acids (FISH OIL MAXIMUM STRENGTH) 1200 MG CAPS Take 1 capsule by mouth daily.       Marland Kitchen omeprazole (PRILOSEC) 20 MG capsule Take 20 mg by mouth daily.        . pravastatin (PRAVACHOL) 80 MG tablet Take 80 mg by mouth daily.      Marland Kitchen sulfamethoxazole-trimethoprim (BACTRIM DS) 800-160 MG per tablet Take 1 tablet  by mouth 2 (two) times daily.      . nitroGLYCERIN (NITROSTAT) 0.4 MG SL tablet Place 0.4 mg under the tongue every 5 (five) minutes as needed. For chest pain        Results for orders placed during the hospital encounter of 11/27/12 (from the past 48 hour(s))  SURGICAL PCR SCREEN     Status: Normal   Collection Time   11/27/12  3:03 PM      Component Value Range Comment   MRSA, PCR NEGATIVE  NEGATIVE    Staphylococcus aureus NEGATIVE  NEGATIVE   BASIC METABOLIC PANEL     Status: Abnormal   Collection Time   11/27/12  3:18 PM      Component Value Range Comment   Sodium 139  135 - 145 mEq/L    Potassium 5.1  3.5 - 5.1 mEq/L HEMOLYSIS AT THIS LEVEL MAY AFFECT RESULT   Chloride 101  96 - 112 mEq/L    CO2 29  19 - 32 mEq/L    Glucose, Bld 87  70 - 99 mg/dL    BUN 38 (*) 6 - 23 mg/dL    Creatinine, Ser 2.67 (*) 0.50 - 1.35 mg/dL    Calcium 9.9  8.4 - 10.5 mg/dL     GFR calc non Af Amer 23 (*) >90 mL/min    GFR calc Af Amer 27 (*) >90 mL/min   CBC     Status: Abnormal   Collection Time   11/27/12  3:18 PM      Component Value Range Comment   WBC 5.8  4.0 - 10.5 K/uL    RBC 5.59  4.22 - 5.81 MIL/uL    Hemoglobin 15.5  13.0 - 17.0 g/dL    HCT 47.1  39.0 - 52.0 %    MCV 84.3  78.0 - 100.0 fL    MCH 27.7  26.0 - 34.0 pg    MCHC 32.9  30.0 - 36.0 g/dL    RDW 15.4  11.5 - 15.5 %    Platelets 109 (*) 150 - 400 K/uL    No results found.  Review of Systems  Constitutional: Negative.   HENT: Negative.   Eyes: Negative.   Respiratory: Negative.   Cardiovascular: Negative.   Gastrointestinal: Negative.   Genitourinary: Negative.   Musculoskeletal: Negative.   Skin: Negative.   Neurological: Positive for tingling.       Numbness of left hand  Endo/Heme/Allergies: Negative.   Psychiatric/Behavioral: Negative.     Blood pressure 126/81, pulse 51, temperature 97.4 F (36.3 C), temperature source Oral, resp. rate 18, SpO2 94.00%. Physical Exam  Constitutional: He is oriented to person, place, and time. He appears well-developed and well-nourished.  HENT:  Head: Normocephalic and atraumatic.  Eyes: EOM are normal. Pupils are equal, round, and reactive to light.  Neck: Neck supple.  Cardiovascular: Normal rate.   Respiratory: Effort normal.  GI: Soft. Bowel sounds are normal.  Musculoskeletal:       + tinel sign at the left wrist and left elbow  Neurological: He is alert and oriented to person, place, and time.  Skin: Skin is dry.  Psychiatric: He has a normal mood and affect.     Assessment/Plan Left cubital tunnel syndrome at the elbow. Left carpal tunnel syndrome  PLAN:  Left anterior submuscular transposition of the ulnar nerve at the elbow and left open carpal tunnel release.  Ladelle Teodoro M 11/28/2012, 8:53 AM

## 2012-11-28 ENCOUNTER — Encounter (HOSPITAL_COMMUNITY): Payer: Self-pay | Admitting: *Deleted

## 2012-11-28 ENCOUNTER — Encounter (HOSPITAL_COMMUNITY): Payer: Self-pay | Admitting: Anesthesiology

## 2012-11-28 ENCOUNTER — Ambulatory Visit (HOSPITAL_COMMUNITY)
Admission: RE | Admit: 2012-11-28 | Discharge: 2012-11-28 | Disposition: A | Discharge status: Home / Self Care | Payer: Medicare Other | Source: Ambulatory Visit | Attending: Specialist | Admitting: Specialist

## 2012-11-28 ENCOUNTER — Ambulatory Visit (HOSPITAL_COMMUNITY): Payer: Medicare Other | Admitting: Anesthesiology

## 2012-11-28 ENCOUNTER — Encounter (HOSPITAL_COMMUNITY)
Admission: RE | Disposition: A | Discharge status: Home / Self Care | Payer: Self-pay | Source: Ambulatory Visit | Attending: Specialist

## 2012-11-28 ENCOUNTER — Encounter (HOSPITAL_COMMUNITY): Payer: Self-pay | Admitting: Specialist

## 2012-11-28 DIAGNOSIS — Z96649 Presence of unspecified artificial hip joint: Secondary | ICD-10-CM | POA: Insufficient documentation

## 2012-11-28 DIAGNOSIS — G5622 Lesion of ulnar nerve, left upper limb: Secondary | ICD-10-CM | POA: Diagnosis present

## 2012-11-28 DIAGNOSIS — K219 Gastro-esophageal reflux disease without esophagitis: Secondary | ICD-10-CM | POA: Insufficient documentation

## 2012-11-28 DIAGNOSIS — I739 Peripheral vascular disease, unspecified: Secondary | ICD-10-CM | POA: Insufficient documentation

## 2012-11-28 DIAGNOSIS — Z823 Family history of stroke: Secondary | ICD-10-CM | POA: Insufficient documentation

## 2012-11-28 DIAGNOSIS — I129 Hypertensive chronic kidney disease with stage 1 through stage 4 chronic kidney disease, or unspecified chronic kidney disease: Secondary | ICD-10-CM | POA: Insufficient documentation

## 2012-11-28 DIAGNOSIS — Z9089 Acquired absence of other organs: Secondary | ICD-10-CM | POA: Insufficient documentation

## 2012-11-28 DIAGNOSIS — F329 Major depressive disorder, single episode, unspecified: Secondary | ICD-10-CM | POA: Insufficient documentation

## 2012-11-28 DIAGNOSIS — Z8249 Family history of ischemic heart disease and other diseases of the circulatory system: Secondary | ICD-10-CM | POA: Insufficient documentation

## 2012-11-28 DIAGNOSIS — Z87891 Personal history of nicotine dependence: Secondary | ICD-10-CM | POA: Insufficient documentation

## 2012-11-28 DIAGNOSIS — G5620 Lesion of ulnar nerve, unspecified upper limb: Secondary | ICD-10-CM | POA: Insufficient documentation

## 2012-11-28 DIAGNOSIS — G5602 Carpal tunnel syndrome, left upper limb: Secondary | ICD-10-CM | POA: Diagnosis present

## 2012-11-28 DIAGNOSIS — E785 Hyperlipidemia, unspecified: Secondary | ICD-10-CM | POA: Insufficient documentation

## 2012-11-28 DIAGNOSIS — N189 Chronic kidney disease, unspecified: Secondary | ICD-10-CM | POA: Insufficient documentation

## 2012-11-28 DIAGNOSIS — Z01812 Encounter for preprocedural laboratory examination: Secondary | ICD-10-CM | POA: Insufficient documentation

## 2012-11-28 DIAGNOSIS — F411 Generalized anxiety disorder: Secondary | ICD-10-CM | POA: Insufficient documentation

## 2012-11-28 DIAGNOSIS — M129 Arthropathy, unspecified: Secondary | ICD-10-CM | POA: Insufficient documentation

## 2012-11-28 DIAGNOSIS — G5600 Carpal tunnel syndrome, unspecified upper limb: Secondary | ICD-10-CM | POA: Insufficient documentation

## 2012-11-28 DIAGNOSIS — I252 Old myocardial infarction: Secondary | ICD-10-CM | POA: Insufficient documentation

## 2012-11-28 DIAGNOSIS — F3289 Other specified depressive episodes: Secondary | ICD-10-CM | POA: Insufficient documentation

## 2012-11-28 DIAGNOSIS — I2510 Atherosclerotic heart disease of native coronary artery without angina pectoris: Secondary | ICD-10-CM | POA: Insufficient documentation

## 2012-11-28 HISTORY — PX: CARPAL TUNNEL RELEASE: SHX101

## 2012-11-28 SURGERY — CARPAL TUNNEL RELEASE
Anesthesia: General | Site: Arm Lower | Laterality: Left | Wound class: Clean

## 2012-11-28 MED ORDER — METHOCARBAMOL 500 MG PO TABS
500.0000 mg | ORAL_TABLET | Freq: Four times a day (QID) | ORAL | Status: DC | PRN
  Administered 2012-11-28: 500 mg via ORAL

## 2012-11-28 MED ORDER — OXYCODONE HCL 5 MG/5ML PO SOLN: 5.0000 mg | Freq: Once | ORAL | Status: DC | PRN

## 2012-11-28 MED ORDER — ONDANSETRON HCL 4 MG/2ML IJ SOLN
INTRAMUSCULAR | Status: DC | PRN
  Administered 2012-11-28: 4 mg via INTRAVENOUS

## 2012-11-28 MED ORDER — MIDAZOLAM HCL 5 MG/5ML IJ SOLN
INTRAMUSCULAR | Status: DC | PRN
  Administered 2012-11-28: 2 mg via INTRAVENOUS

## 2012-11-28 MED ORDER — BUPIVACAINE HCL (PF) 0.25 % IJ SOLN
INTRAMUSCULAR | Status: DC | PRN
  Administered 2012-11-28: 5 mL

## 2012-11-28 MED ORDER — OXYCODONE-ACETAMINOPHEN 5-325 MG PO TABS
1.0000 | ORAL_TABLET | ORAL | Status: DC | PRN
  Administered 2012-11-28: 2 via ORAL

## 2012-11-28 MED ORDER — METHOCARBAMOL 100 MG/ML IJ SOLN: 500.0000 mg | Freq: Four times a day (QID) | INTRAVENOUS | Status: DC | PRN

## 2012-11-28 MED ORDER — 0.9 % SODIUM CHLORIDE (POUR BTL) OPTIME
TOPICAL | Status: DC | PRN
  Administered 2012-11-28: 1000 mL

## 2012-11-28 MED ORDER — FENTANYL CITRATE 0.05 MG/ML IJ SOLN: 25.0000 ug | INTRAMUSCULAR | Status: DC | PRN

## 2012-11-28 MED ORDER — DEXAMETHASONE SODIUM PHOSPHATE 4 MG/ML IJ SOLN
INTRAMUSCULAR | Status: DC | PRN
  Administered 2012-11-28: 12 mg via INTRAVENOUS

## 2012-11-28 MED ORDER — OXYCODONE-ACETAMINOPHEN 5-325 MG PO TABS
ORAL_TABLET | ORAL | Status: AC
  Filled 2012-11-28: qty 2

## 2012-11-28 MED ORDER — LACTATED RINGERS IV SOLN
INTRAVENOUS | Status: DC | PRN
  Administered 2012-11-28: 10:00:00 via INTRAVENOUS

## 2012-11-28 MED ORDER — LACTATED RINGERS IV SOLN
INTRAVENOUS | Status: DC
  Administered 2012-11-28: 09:00:00 via INTRAVENOUS

## 2012-11-28 MED ORDER — METOCLOPRAMIDE HCL 5 MG/ML IJ SOLN: 10.0000 mg | Freq: Once | INTRAMUSCULAR | Status: DC | PRN

## 2012-11-28 MED ORDER — PROPOFOL 10 MG/ML IV BOLUS
INTRAVENOUS | Status: DC | PRN
  Administered 2012-11-28: 200 mg via INTRAVENOUS

## 2012-11-28 MED ORDER — FENTANYL CITRATE 0.05 MG/ML IJ SOLN
INTRAMUSCULAR | Status: DC | PRN
  Administered 2012-11-28 (×3): 50 ug via INTRAVENOUS

## 2012-11-28 MED ORDER — OXYCODONE-ACETAMINOPHEN 5-325 MG PO TABS: ORAL_TABLET | ORAL | Status: DC

## 2012-11-28 MED ORDER — GLYCOPYRROLATE 0.2 MG/ML IJ SOLN
INTRAMUSCULAR | Status: DC | PRN
  Administered 2012-11-28: 0.2 mg via INTRAVENOUS

## 2012-11-28 MED ORDER — LIDOCAINE HCL (CARDIAC) 20 MG/ML IV SOLN
INTRAVENOUS | Status: DC | PRN
  Administered 2012-11-28: 100 mg via INTRAVENOUS

## 2012-11-28 MED ORDER — METHOCARBAMOL 500 MG PO TABS
ORAL_TABLET | ORAL | Status: AC
  Filled 2012-11-28: qty 1

## 2012-11-28 MED ORDER — OXYCODONE HCL 5 MG PO TABS: 5.0000 mg | ORAL_TABLET | Freq: Once | ORAL | Status: DC | PRN

## 2012-11-28 SURGICAL SUPPLY — 50 items
BANDAGE ELASTIC 3 VELCRO ST LF (GAUZE/BANDAGES/DRESSINGS) ×2 IMPLANT
BANDAGE ELASTIC 4 VELCRO ST LF (GAUZE/BANDAGES/DRESSINGS) ×1 IMPLANT
BANDAGE ELASTIC 6 VELCRO ST LF (GAUZE/BANDAGES/DRESSINGS) ×1 IMPLANT
BANDAGE GAUZE ELAST BULKY 4 IN (GAUZE/BANDAGES/DRESSINGS) ×2 IMPLANT
BNDG CMPR 9X4 STRL LF SNTH (GAUZE/BANDAGES/DRESSINGS) ×1
BNDG ESMARK 4X9 LF (GAUZE/BANDAGES/DRESSINGS) ×2 IMPLANT
CLOTH BEACON ORANGE TIMEOUT ST (SAFETY) ×2 IMPLANT
COVER SURGICAL LIGHT HANDLE (MISCELLANEOUS) ×2 IMPLANT
CUFF TOURNIQUET SINGLE 18IN (TOURNIQUET CUFF) ×2 IMPLANT
CUFF TOURNIQUET SINGLE 24IN (TOURNIQUET CUFF) IMPLANT
DRAIN PENROSE 1/4X12 LTX STRL (WOUND CARE) ×1 IMPLANT
DRAPE U-SHAPE 47X51 STRL (DRAPES) ×2 IMPLANT
DRSG ADAPTIC 3X8 NADH LF (GAUZE/BANDAGES/DRESSINGS) ×1 IMPLANT
DRSG PAD ABDOMINAL 8X10 ST (GAUZE/BANDAGES/DRESSINGS) ×1 IMPLANT
DURAPREP 26ML APPLICATOR (WOUND CARE) ×2 IMPLANT
ELECT REM PT RETURN 9FT ADLT (ELECTROSURGICAL) ×2
ELECTRODE REM PT RTRN 9FT ADLT (ELECTROSURGICAL) ×1 IMPLANT
GAUZE XEROFORM 1X8 LF (GAUZE/BANDAGES/DRESSINGS) ×2 IMPLANT
GLOVE BIOGEL PI IND STRL 7.5 (GLOVE) ×1 IMPLANT
GLOVE BIOGEL PI INDICATOR 7.5 (GLOVE) ×1
GLOVE ECLIPSE 7.0 STRL STRAW (GLOVE) ×2 IMPLANT
GLOVE ECLIPSE 8.5 STRL (GLOVE) ×2 IMPLANT
GLOVE SURG 8.5 LATEX PF (GLOVE) ×2 IMPLANT
GOWN PREVENTION PLUS LG XLONG (DISPOSABLE) IMPLANT
GOWN PREVENTION PLUS XXLARGE (GOWN DISPOSABLE) ×2 IMPLANT
GOWN STRL NON-REIN LRG LVL3 (GOWN DISPOSABLE) ×4 IMPLANT
KIT BASIN OR (CUSTOM PROCEDURE TRAY) ×2 IMPLANT
KIT ROOM TURNOVER OR (KITS) ×2 IMPLANT
NDL HYPO 25GX1X1/2 BEV (NEEDLE) ×1 IMPLANT
NEEDLE HYPO 25GX1X1/2 BEV (NEEDLE) ×2 IMPLANT
NS IRRIG 1000ML POUR BTL (IV SOLUTION) ×2 IMPLANT
PACK ORTHO EXTREMITY (CUSTOM PROCEDURE TRAY) ×2 IMPLANT
PAD ARMBOARD 7.5X6 YLW CONV (MISCELLANEOUS) ×4 IMPLANT
PAD CAST 4YDX4 CTTN HI CHSV (CAST SUPPLIES) ×1 IMPLANT
PADDING CAST COTTON 4X4 STRL (CAST SUPPLIES) ×2
SLING ARM FOAM STRAP LRG (SOFTGOODS) ×1 IMPLANT
SPONGE GAUZE 4X4 12PLY (GAUZE/BANDAGES/DRESSINGS) ×2 IMPLANT
SPONGE LAP 4X18 X RAY DECT (DISPOSABLE) IMPLANT
STRIP CLOSURE SKIN 1/2X4 (GAUZE/BANDAGES/DRESSINGS) ×2 IMPLANT
SUCTION FRAZIER TIP 10 FR DISP (SUCTIONS) ×2 IMPLANT
SUT ETHILON 4 0 PS 2 18 (SUTURE) IMPLANT
SUT PROLENE 3 0 PS 2 (SUTURE) ×2 IMPLANT
SUT VIC AB 3-0 PS2 18 (SUTURE) ×2
SUT VIC AB 3-0 PS2 18XBRD (SUTURE) ×1 IMPLANT
SYR CONTROL 10ML LL (SYRINGE) ×2 IMPLANT
TOWEL OR 17X24 6PK STRL BLUE (TOWEL DISPOSABLE) ×2 IMPLANT
TOWEL OR 17X26 10 PK STRL BLUE (TOWEL DISPOSABLE) ×2 IMPLANT
TUBE CONNECTING 12X1/4 (SUCTIONS) ×2 IMPLANT
UNDERPAD 30X30 INCONTINENT (UNDERPADS AND DIAPERS) ×2 IMPLANT
WATER STERILE IRR 1000ML POUR (IV SOLUTION) ×2 IMPLANT

## 2012-11-28 NOTE — Op Note (Signed)
11/28/2012  12:14 PM  PATIENT:  Derek [name]  66 y.o. male  MRN: [id number]  OPERATIVE REPORT   PRE-OPERATIVE DIAGNOSIS:  Left cubital tunnel syndrome and left carpal tunnel syndrome  POST-OPERATIVE DIAGNOSIS:  Left cubital tunnel syndrome and left carpal tunnel syndrome  PROCEDURE:  Procedure(s): LEFT ANTERIOR SUBMUSCULAR TRANSPOSITION OF THE ULNAR NERVE  AT THE ELBOW AND LEFT OPEN CARPAL TUNNEL RELEASE    SURGEON:  Jessy Oto, MD     ASSISTANT:  Phillips Hay, PA-C  (Present throughout the entire procedure and necessary for completion of procedure in a timely manner)     ANESTHESIA:  General supplemented with local anesthetic Marcaine 1/2 % plain 10 cc. Dr. Albertina Parr.    COMPLICATIONS:  None.     TOURNIQUET TIME: 47 minutes at  250 mmHg   DRAIN: 1/4" penrose drain left distal elbow incision.  PROCEDURE: The patient was met in the holding area, and the appropriate left  elbow and wrist identified and marked with an X. and my initials.  The patient was then transported to OR and was placed on the operative table in a supine position. The patient was then placed under general anesthesia without difficulty. The patient received appropriate preoperative antibiotic prophylaxis.     The left upper extremity was then prepped using sterile conditions and draped using sterile technique.  Time-out procedure was called and correct.left upper sham the was elevated exsanguinated with an Esmarch bandage and tourniquet inflated to 250 mm mercury. Using loope magnification and head lamp a 1.5 inch incision curved at the wrist crease with 15 blade scalpel.  Incision through skin and subcutaneous tissue to the volar forearm fascia and  transverse carpal ligament. Fascia then carefully lifed and incised with Stevens scissors inline with the fourth digit. The skin and subcutaneous tissue retracted and the volar fascia divided under direct vision from distal to proximal. A freer elevator then  carefully placed between the median nerve and the transverse carpal ligament protecting the  median nerve as the transverse carpal ligament was divided with a 15 blade scalpel in line with the fourth digit. Retracting the distal skin and subcutaneous tissues distally under direct visualization the remaining portions of the transverse carpal ligament were divided with tenotomy scissors again in line with the fourth digit. The palmar fascia was then divided until the traversing superficial palmar arch was identified and preserved intact.  The motor branch of the median nerve was carefully examined and identified intact.  Attention then turned to the left elbow, with the elbow flexed and externally rotated an incision was made approximately 10 cm in length extending from proximal to the medial epicondyle in mind and with the medial epicondyle and then extending to the proximal forearm. Incision through skin subcutaneous layers directly down to the superficial fascia overlying the medial triceps and overlying the medial epicondyle and the superficial fascia to the volar proximal compartment of the forearm. And the fascia along the medial aspect of the distal triceps was then incised using Metzenbaum scissors the ulnar nerve identified. The fascia was then incised extending proximally over the superficial aspect of the ulnar nerve and the nerve felt to be free. Small bands of fibrous tissue were released and this area and the nerve freed to the region of the arcade of Struthers. The nerve was then freed up circumferentially as proximal to the cubital tunnel and a vessel loop passed. This was used for retraction of the nerve during dissection of the fibers tissue superficial  to the nerve as it entered the cubital tunnel. The tissue superficial to the ulnar nerve was carefully freed and then divided using Metzenbaum scissors from proximal to distal the superficial portion of the flexor compartment and size. At least  3 branches that were articular branches of the ulnar nerve for individually divided away from the nerve a few millimeters. Nerve carefully freed and was able to the placed anterior to the medial condyle easily with adequate dissection here. The attachment of the medial conjoin tendon to the medial condyle was then carefully freed off its attachment from the medial condyle with sharp dissection using a 15 blade scalpel. The muscle flaps were then rotated distally and a bed for the ulnar nerve and submuscular transposition was carefully developed.  The intermuscular septum along the medial distal arm was then incised some amount of venous bleeder bleeding encountered her and this was controlled using bipolar electrocautery as well as suture ligation with 2-0 Vicryl suture. Similarly the proximal forearm fascia was incised along the superficial ulnar border in order to allow a bed for the nerve to transit without significant pressure associated with the fascial band there. The nerve was then easily anteriorly then transposed into this area. A medial epicondylectomy was then performed performing some periosteal dissection and then resecting using small osteotomes and smoothing to the resected head using bare rongeur. Periosteum was preserved for suturing to the conjoined tendon attachment. Towel clip was then used to make an opening into the medial epicondyle of bed. 2-0 Ethibond suture and then used passed in a modified Tagima suture used to attach the conjoin tendon to the medial epicondyle the length and position. The periosteum along the lateral border of the resected medial epicondyle surface then approximated to the conjoin tendon. Small amount of subcutaneous fat was also sutured over the superior proximal aspect of the tunnel in order to prevent the nerve from subluxing in this area. Both the proximal and distal openings for the ulnar nerve the and showed no significant nerve compression. Tourniquet was  released small bleeders along the distal bed of the ulnar were cauterized using bipolar cautery. Irrigation was carried out and the incision then closed using subcutaneous 2-0 Vicryl sutures and vertical mattress sutures of 4-0 nylon. A quarter-inch Penrose drain placed in the depths the incision exiting distally. The wrist incision was then irrigated with copious amounts of irrigant solution, No active bleeding was present. The incision closed with a single layer skin closure of 4-0 nylon horizontal mattress sutures. Well-padded dressing using Adaptic 4 x 4's ABDs fixed to the elbow with sterile webril.  Dry dressing of adaptic, 4x4s held in place  at the wrist with sterile webril.  A well padded volar splint applied  to the wrist and a posterior long-arm splint to the elbow. A sling was applied to the left upper extremity.  The patient reactivated and returned to the PACU in good condition.  All instruments and sponge counts were correct.          NITKA,JAMES E  11/28/2012, 12:14 PM

## 2012-11-28 NOTE — Transfer of Care (Signed)
Immediate Anesthesia Transfer of Care Note  Patient: Derek [name]  Procedure(s) Performed: Procedure(s) (LRB) with comments: CARPAL TUNNEL RELEASE (Left) - Left anterior submuscular transposition of ulnar nerve at elbow, left open carpal tunnel release  Patient Location: PACU  Anesthesia Type:General  Level of Consciousness: awake, alert  and oriented  Airway & Oxygen Therapy: Patient Spontanous Breathing and Patient connected to nasal cannula oxygen  Post-op Assessment: Report given to PACU RN, Post -op Vital signs reviewed and stable and Patient moving all extremities  Post vital signs: Reviewed and stable  Complications: No apparent anesthesia complications

## 2012-11-28 NOTE — Preoperative (Signed)
Beta Blockers   Reason not to administer Beta Blockers:Taken 11/28/12 @0545 

## 2012-11-28 NOTE — Brief Op Note (Addendum)
11/28/2012  12:09 PM  PATIENT:  Derek [name]  66 y.o. male  PRE-OPERATIVE DIAGNOSIS:  Left cubital tunnel syndrome and left carpal tunnel syndrome  POST-OPERATIVE DIAGNOSIS:  Left cubital tunnel syndrome and left carpal tunnel syndrome  PROCEDURE:  Procedure(s) (LRB) with comments: CARPAL TUNNEL RELEASE (Left) - Left anterior submuscular transposition of ulnar nerve at elbow, left open carpal tunnel release  SURGEON:  Surgeon(s) and Role:    Jessy Oto, MD - Primary  PHYSICIAN ASSISTANT:Sheila Lynnae Sandhoff, PA-C   ANESTHESIA:   local, general and  Local with Marcaine 1/2% plain 10cc, Dr. Albertina Parr  EBL:15cc     BLOOD ADMINISTERED:none  DRAINS: Penrose drain in the right distal posterior elbow incision   LOCAL MEDICATIONS USED:  MARCAINE    and Amount: 10 ml  SPECIMEN:  No Specimen  DISPOSITION OF SPECIMEN:  N/A  COUNTS:  YES  TOURNIQUET:   Total Tourniquet Time Documented: Upper Arm (Left) - 47 minutes  DICTATION: .Viviann Spare Dictation  PLAN OF CARE: Discharge to home after PACU  PATIENT DISPOSITION:  PACU - hemodynamically stable.   Delay start of Pharmacological VTE agent (>24hrs) due to surgical blood loss or risk of bleeding: not applicable

## 2012-11-28 NOTE — H&P (Signed)
Patient was seen and examined in the preop holding area. There has been no interval  Change in this patient's exam preop  history and physical exam  Lab tests and images have been examined and reviewed.  The Risks benefits and alternative treatments have been discussed  extensively,questions answered.  The patient has elected to undergo the discussed surgical treatment. 

## 2012-11-28 NOTE — Anesthesia Preprocedure Evaluation (Addendum)
Anesthesia Evaluation  Patient identified by MRN, date of birth, ID band Patient awake    Reviewed: Allergy & Precautions, H&P , NPO status , Patient's Chart, lab work & pertinent test results, reviewed documented beta blocker date and time   Airway Mallampati: II TM Distance: >3 FB Neck ROM: full    Dental  (+) Edentulous Upper and Edentulous Lower   Pulmonary neg pulmonary ROS, former smoker,  breath sounds clear to auscultation        Cardiovascular hypertension, On Medications and On Home Beta Blockers + CAD, + Past MI, + Cardiac Stents and + Peripheral Vascular Disease Rhythm:regular     Neuro/Psych PSYCHIATRIC DISORDERS negative neurological ROS     GI/Hepatic Neg liver ROS, GERD-  Medicated and Controlled,  Endo/Other  negative endocrine ROS  Renal/GU Renal InsufficiencyRenal disease  negative genitourinary   Musculoskeletal   Abdominal   Peds  Hematology  (+) Blood dyscrasia, ,   Anesthesia Other Findings See surgeon's H&P   Reproductive/Obstetrics negative OB ROS                          Anesthesia Physical Anesthesia Plan  ASA: III  Anesthesia Plan: General   Post-op Pain Management:    Induction: Intravenous  Airway Management Planned: LMA  Additional Equipment:   Intra-op Plan:   Post-operative Plan: Extubation in OR  Informed Consent: I have reviewed the patients History and Physical, chart, labs and discussed the procedure including the risks, benefits and alternatives for the proposed anesthesia with the patient or authorized representative who has indicated his/her understanding and acceptance.   Dental Advisory Given and Dental advisory given  Plan Discussed with: CRNA, Surgeon and Anesthesiologist  Anesthesia Plan Comments:        Anesthesia Quick Evaluation

## 2012-11-28 NOTE — Anesthesia Postprocedure Evaluation (Signed)
Anesthesia Post Note  Patient: Derek [name]  Procedure(s) Performed: Procedure(s) (LRB): CARPAL TUNNEL RELEASE (Left)  Anesthesia type: General  Patient location: PACU  Post pain: Pain level controlled  Post assessment: Patient's Cardiovascular Status Stable  Last Vitals:  Filed Vitals:   11/28/12 1300  BP: 162/80  Pulse: 58  Temp:   Resp: 12    Post vital signs: Reviewed and stable  Level of consciousness: alert  Complications: No apparent anesthesia complications

## 2012-11-28 NOTE — H&P (Signed)
Patient examined and H&P reviewed with Lonzo Candy.

## 2012-11-28 NOTE — Anesthesia Procedure Notes (Signed)
Procedure Name: LMA Insertion Date/Time: 11/28/2012 10:15 AM Performed by: Sabra Heck L Pre-anesthesia Checklist: Patient identified, Timeout performed, Emergency Drugs available, Suction available and Patient being monitored Patient Re-evaluated:Patient Re-evaluated prior to inductionOxygen Delivery Method: Circle system utilized Preoxygenation: Pre-oxygenation with 100% oxygen Intubation Type: IV induction Ventilation: Mask ventilation without difficulty LMA: LMA inserted LMA Size: 5.0 Number of attempts: 1 Placement Confirmation: positive ETCO2 and breath sounds checked- equal and bilateral Tube secured with: Tape Dental Injury: Teeth and Oropharynx as per pre-operative assessment

## 2012-11-30 ENCOUNTER — Encounter (HOSPITAL_COMMUNITY): Payer: Self-pay | Admitting: Specialist

## 2013-01-10 ENCOUNTER — Other Ambulatory Visit: Payer: Self-pay | Admitting: *Deleted

## 2013-01-10 MED ORDER — METOPROLOL TARTRATE 25 MG PO TABS: 25.0000 mg | ORAL_TABLET | Freq: Two times a day (BID) | ORAL | Status: DC

## 2013-04-01 ENCOUNTER — Encounter: Payer: Self-pay | Admitting: Cardiology

## 2013-04-02 ENCOUNTER — Ambulatory Visit (INDEPENDENT_AMBULATORY_CARE_PROVIDER_SITE_OTHER): Payer: Medicare Other | Admitting: Cardiology

## 2013-04-02 ENCOUNTER — Encounter: Payer: Self-pay | Admitting: Cardiology

## 2013-04-02 VITALS — BP 121/74 | HR 59 | Ht 70.0 in | Wt 193.0 lb

## 2013-04-02 DIAGNOSIS — I10 Essential (primary) hypertension: Secondary | ICD-10-CM

## 2013-04-02 DIAGNOSIS — E785 Hyperlipidemia, unspecified: Secondary | ICD-10-CM

## 2013-04-02 DIAGNOSIS — I2510 Atherosclerotic heart disease of native coronary artery without angina pectoris: Secondary | ICD-10-CM

## 2013-04-02 MED ORDER — METOPROLOL TARTRATE 25 MG PO TABS: 25.0000 mg | ORAL_TABLET | Freq: Two times a day (BID) | ORAL | Status: DC

## 2013-04-02 NOTE — Assessment & Plan Note (Signed)
Would recommend FLP and LFT at his followup visit with Dr. Luan Pulling in June.

## 2013-04-02 NOTE — Assessment & Plan Note (Signed)
Suggest BMET for repeat baseline at followup visit with Dr. Luan Pulling in June.

## 2013-04-02 NOTE — Assessment & Plan Note (Signed)
No progressive angina symptoms on medical therapy. History of DES to RCA in 2007, ischemic testing in 2010 reviewed. Plan will be to continue observation for now.

## 2013-04-02 NOTE — Progress Notes (Signed)
Clinical Summary Mr. Stranger is a 67 y.o.male last seen in October 2013. He reports limited, brief episodes of midsternal chest pain, one episode of right posterior upper arm discomfort after eating. These are nonexertional, lasted for only a few minutes. He has not used any nitroglycerin for these episodes. Reports no typical, recurring exertional chest pain.  Thallium myocardial perfusion study in May 2010 demonstrated small areas of ischemia at the apex and high lateral wall, managed medically.  Medications reviewed. He needs refills for Lopressor. States he will be seeing Dr. Luan Pulling in June for a 6 month visit, likely to have lab work at that time. No recent lipids for review.  No Known Allergies  Current Outpatient Prescriptions  Medication Sig Dispense Refill  . allopurinol (ZYLOPRIM) 300 MG tablet Take 300 mg by mouth daily.        Marland Kitchen aspirin EC 81 MG tablet Take 81 mg by mouth daily.      Marland Kitchen buPROPion (WELLBUTRIN SR) 100 MG 12 hr tablet Take 100 mg by mouth 2 (two) times daily.      . diazepam (VALIUM) 10 MG tablet Take 10 mg by mouth every 12 (twelve) hours. For spasms      . furosemide (LASIX) 80 MG tablet Take 40 mg by mouth daily.       . metoprolol tartrate (LOPRESSOR) 25 MG tablet Take 1 tablet (25 mg total) by mouth 2 (two) times daily.  60 tablet  6  . nitroGLYCERIN (NITROSTAT) 0.4 MG SL tablet Place 0.4 mg under the tongue every 5 (five) minutes as needed. For chest pain      . Omega-3 Fatty Acids (FISH OIL MAXIMUM STRENGTH) 1200 MG CAPS Take 1 capsule by mouth daily.       Marland Kitchen omeprazole (PRILOSEC) 20 MG capsule Take 20 mg by mouth daily.        . pravastatin (PRAVACHOL) 80 MG tablet Take 80 mg by mouth daily.       No current facility-administered medications for this visit.    Past Medical History  Diagnosis Date  . Mixed hyperlipidemia   . Essential hypertension, benign   . GERD (gastroesophageal reflux disease)   . Arthritis   . Depression   . Anxiety   .  Coronary atherosclerosis of native coronary vessel     DES RCA 2007  . Myocardial infarction   . Peripheral vascular disease   . Chronic renal insufficiency     Single kidney  . Chronic idiopathic thrombocytopenia     Social History Mr. Itzkowitz reports that he quit smoking about 19 years ago. His smoking use included Cigarettes. He has a 105 pack-year smoking history. He has never used smokeless tobacco. Mr. Sircy reports that he does not drink alcohol.  Review of Systems Complains of chronic arthritic leg and knee pain. Neuropathic left hand pain status post carpal tunnel surgery. No palpitations, dizziness, syncope. No reported bleeding episodes. Otherwise negative.  Physical Examination Filed Vitals:   04/02/13 1300  BP: 121/74  Pulse: 59   Filed Weights   04/02/13 1300  Weight: 193 lb (87.544 kg)    No acute distress.  HEENT: Conjunctiva and lids normal, oropharynx clear with moist mucosa.  Neck: Supple, no elevated JVP or carotid bruits, no thyromegaly.  Lungs: Clear to auscultation, nonlabored breathing at rest.  Cardiac: Regular rate and rhythm, no S3 or significant systolic murmur, no pericardial rub.  Abdomen: Soft, nontender, bowel sounds present, no guarding or rebound.  Extremities: No pitting  edema, distal pulses 1+.   Problem List and Plan   CAD, NATIVE VESSEL No progressive angina symptoms on medical therapy. History of DES to RCA in 2007, ischemic testing in 2010 reviewed. Plan will be to continue observation for now.  Essential hypertension, benign Blood pressure is well-controlled today.  HYPERLIPIDEMIA-MIXED Would recommend FLP and LFT at his followup visit with Dr. Luan Pulling in June.  CKD (chronic kidney disease) stage 3, GFR 30-59 ml/min Suggest BMET for repeat baseline at followup visit with Dr. Luan Pulling in June.    Satira Sark, M.D., F.A.C.C.

## 2013-04-02 NOTE — Assessment & Plan Note (Signed)
Blood pressure is well-controlled today. 

## 2013-04-02 NOTE — Patient Instructions (Addendum)

## 2013-06-25 IMAGING — CR DG CHEST 2V
2 series · 2 of 2 positions shown · non-contrast
Comparison: 05/27/2004.

CLINICAL DATA: 65-year-old male with left hip osteoarthritis.
Hypertension, hyperlipidemia, coronary atherosclerosis, myocardial
infarction.

CHEST - 2 VIEW

[view not recorded (1 of 2)]
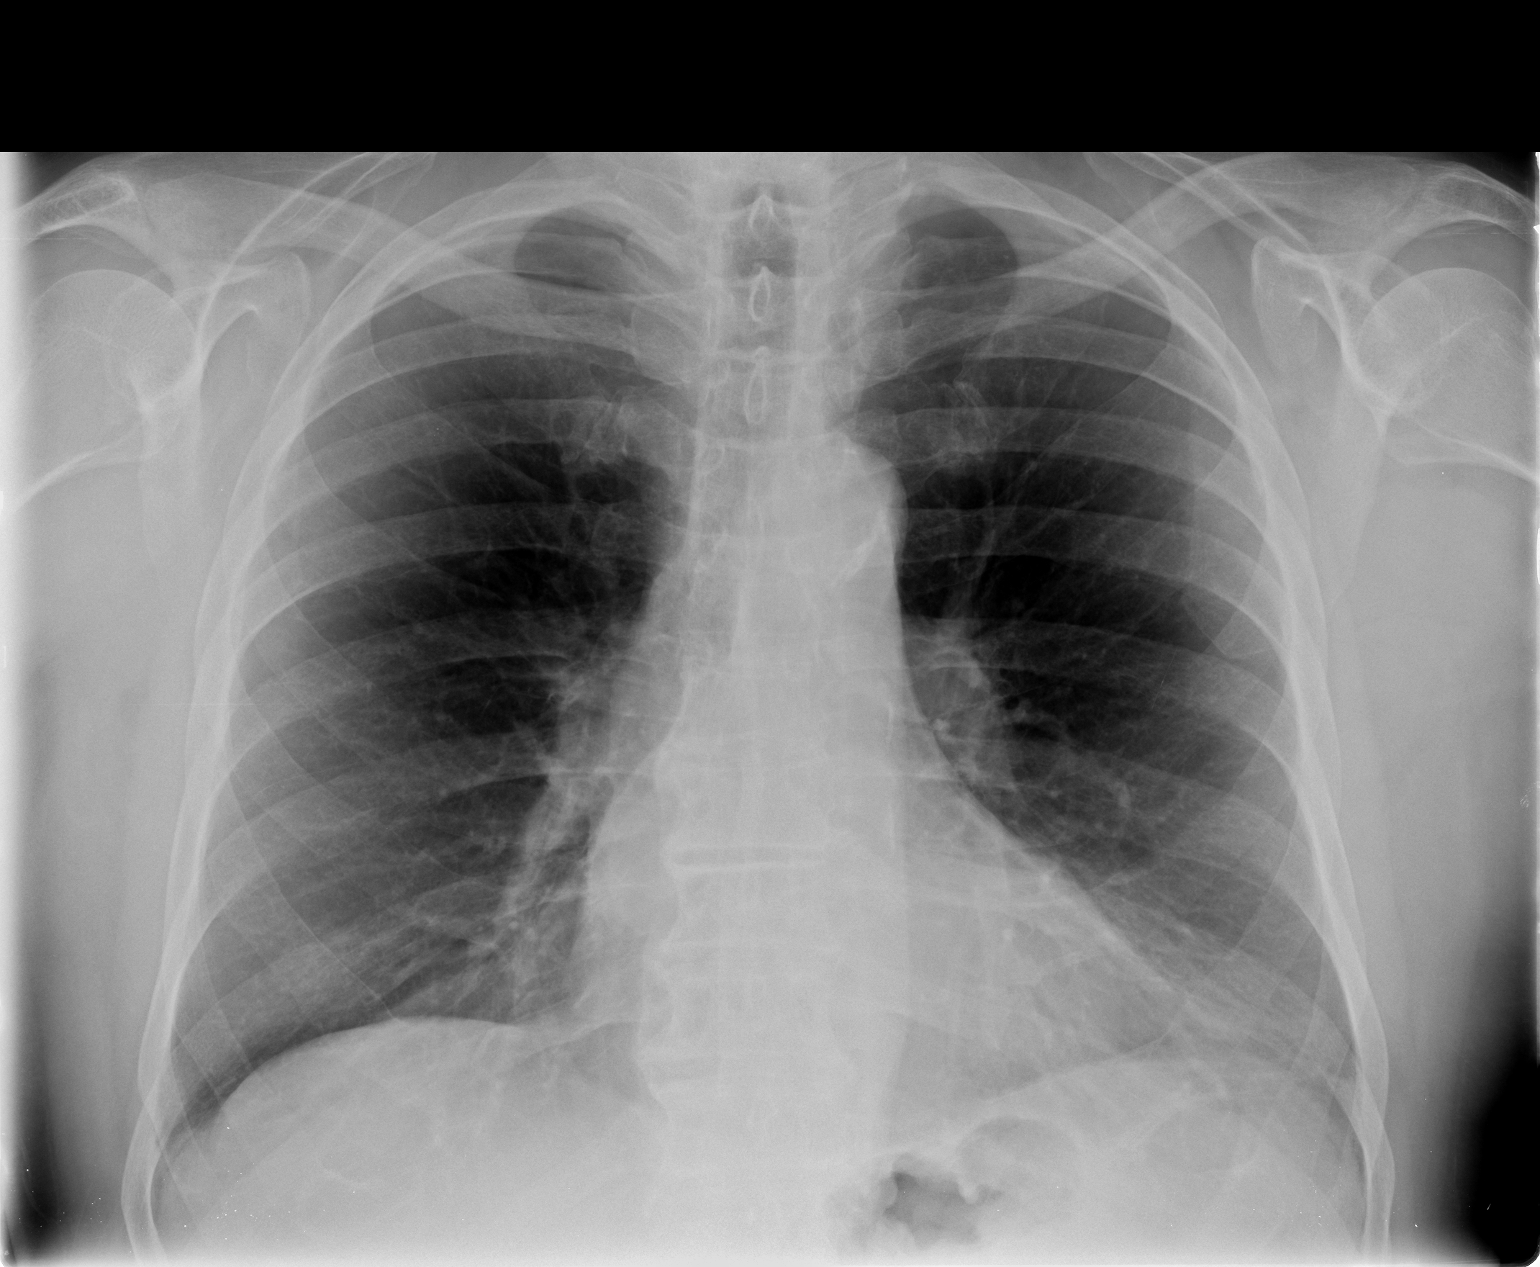

[view not recorded (2 of 2)]
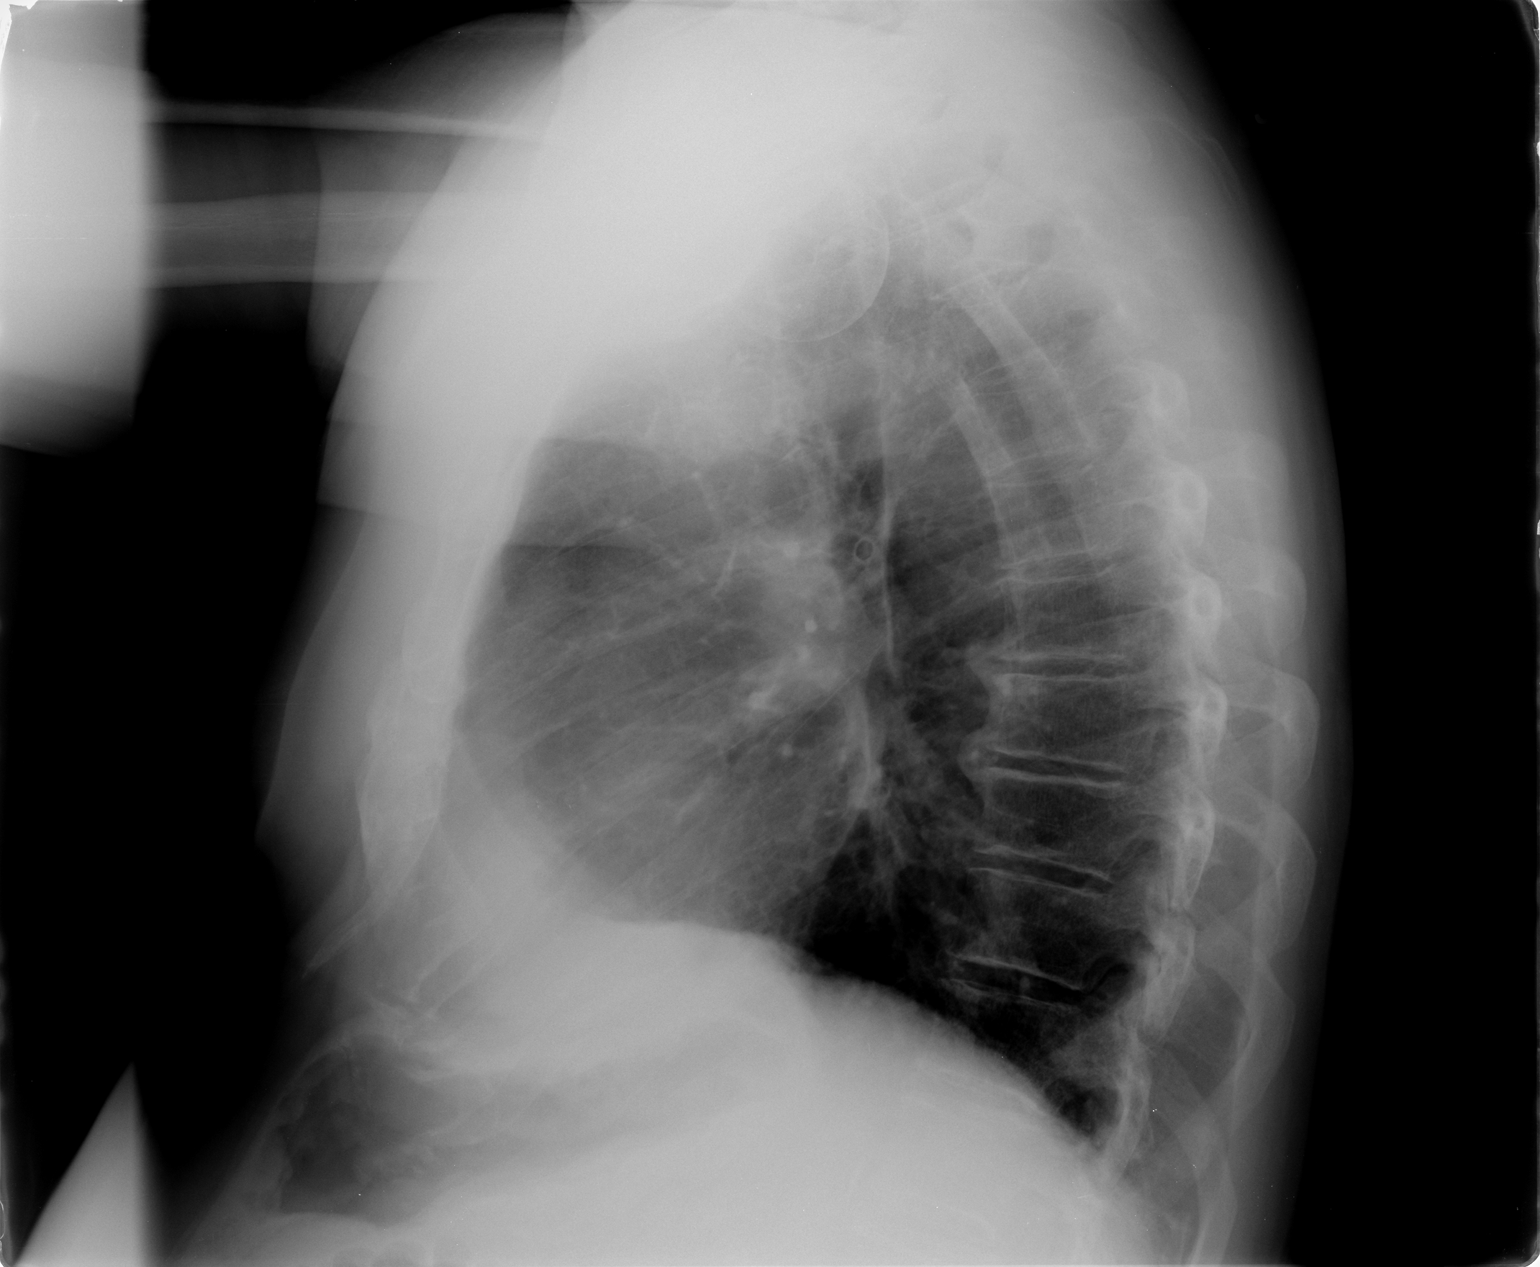

[2 of 2 positions shown; findings below may reference images not displayed]

FINDINGS: Stable to slightly increased cardiac silhouette. Other
mediastinal contours are within normal limits.  No pneumothorax,
pulmonary edema, pleural effusion or confluent pulmonary opacity.
Visualized tracheal air column is within normal limits.  No acute
osseous abnormality identified.
IMPRESSION: No acute cardiopulmonary abnormality.

## 2013-10-02 ENCOUNTER — Other Ambulatory Visit: Payer: Self-pay | Admitting: Dermatology

## 2013-10-03 ENCOUNTER — Ambulatory Visit: Payer: Medicare Other | Admitting: Cardiology

## 2013-10-09 ENCOUNTER — Ambulatory Visit: Payer: Medicare Other | Admitting: Cardiology

## 2013-10-25 ENCOUNTER — Encounter: Payer: Self-pay | Admitting: Cardiology

## 2013-10-25 ENCOUNTER — Ambulatory Visit (INDEPENDENT_AMBULATORY_CARE_PROVIDER_SITE_OTHER): Payer: Medicare Other | Admitting: Cardiology

## 2013-10-25 VITALS — BP 124/77 | HR 60 | Ht 70.5 in | Wt 194.0 lb

## 2013-10-25 DIAGNOSIS — I10 Essential (primary) hypertension: Secondary | ICD-10-CM

## 2013-10-25 DIAGNOSIS — I2510 Atherosclerotic heart disease of native coronary artery without angina pectoris: Secondary | ICD-10-CM

## 2013-10-25 DIAGNOSIS — E785 Hyperlipidemia, unspecified: Secondary | ICD-10-CM

## 2013-10-25 NOTE — Progress Notes (Signed)
Clinical Summary Derek [name] is a 67 y.o.male last seen in April. He reports no progressive chest pain symptoms, has used nitroglycerin only on a few occasions. His ECG shows sinus rhythm with nonspecific ST changes.  Thallium myocardial perfusion study in May 2010 demonstrated small areas of ischemia at the apex and high lateral wall, managed medically.  He has not been exercising regularly, was doing walking in a local pool at help build up his hip and leg muscles. I encouraged him to try and get back to this.  He continues to followup Dr. Luan Pulling for lipid and blood pressure control.   No Known Allergies  Current Outpatient Prescriptions  Medication Sig Dispense Refill  . allopurinol (ZYLOPRIM) 300 MG tablet Take 300 mg by mouth daily.        Marland Kitchen aspirin EC 81 MG tablet Take 81 mg by mouth daily.      Marland Kitchen buPROPion (WELLBUTRIN SR) 100 MG 12 hr tablet Take 100 mg by mouth 2 (two) times daily.      . diazepam (VALIUM) 10 MG tablet Take 10 mg by mouth every 12 (twelve) hours. For spasms      . furosemide (LASIX) 80 MG tablet Take 40 mg by mouth daily.       . metoprolol tartrate (LOPRESSOR) 25 MG tablet Take 1 tablet (25 mg total) by mouth 2 (two) times daily.  60 tablet  6  . nitroGLYCERIN (NITROSTAT) 0.4 MG SL tablet Place 0.4 mg under the tongue every 5 (five) minutes as needed. For chest pain      . Omega-3 Fatty Acids (FISH OIL MAXIMUM STRENGTH) 1200 MG CAPS Take 1 capsule by mouth daily.       Marland Kitchen omeprazole (PRILOSEC) 20 MG capsule Take 20 mg by mouth daily.        . pravastatin (PRAVACHOL) 80 MG tablet Take 80 mg by mouth daily.       No current facility-administered medications for this visit.    Past Medical History  Diagnosis Date  . Mixed hyperlipidemia   . Essential hypertension, benign   . GERD (gastroesophageal reflux disease)   . Arthritis   . Depression   . Anxiety   . Coronary atherosclerosis of native coronary vessel     DES RCA 2007  . Myocardial infarction     . Peripheral vascular disease   . Chronic renal insufficiency     Single kidney  . Chronic idiopathic thrombocytopenia     Social History Derek [name] reports that he quit smoking about 19 years ago. His smoking use included Cigarettes. He has a 105 pack-year smoking history. He has never used smokeless tobacco. Derek [name] reports that he does not drink alcohol.  Review of Systems No palpitations or syncope. No bleeding problems on current regimen. Stable appetite.  Physical Examination Filed Vitals:   10/25/13 1509  BP: 124/77  Pulse: 60   Filed Weights   10/25/13 1509  Weight: 194 lb (87.998 kg)    Appears comfortable at rest.  HEENT: Conjunctiva and lids normal, oropharynx clear with moist mucosa.  Neck: Supple, no elevated JVP or carotid bruits, no thyromegaly.  Lungs: Clear to auscultation, nonlabored breathing at rest.  Cardiac: Regular rate and rhythm, no S3 or significant systolic murmur, no pericardial rub.  Abdomen: Soft, nontender, bowel sounds present, no guarding or rebound.  Extremities: No pitting edema, distal pulses 1+.   Problem List and Plan   CAD, NATIVE VESSEL Symptomatically stable status post DES to  RCA 2007. Continue observation.  Essential hypertension, benign Blood pressure control is good today. No changes made.  HYPERLIPIDEMIA-MIXED Continues on Pravachol, followed by Dr. Luan Pulling.    Satira Sark, M.D., F.A.C.C.

## 2013-10-25 NOTE — Assessment & Plan Note (Signed)
Symptomatically stable status post DES to RCA 2007. Continue observation.

## 2013-10-25 NOTE — Assessment & Plan Note (Signed)
Continues on Pravachol, followed by Dr. Luan Pulling.

## 2013-10-25 NOTE — Patient Instructions (Signed)
Continue all current medications. Your physician wants you to follow up in: 6 months.  You will receive a reminder letter in the mail one-two months in advance.  If you don't receive a letter, please call our office to schedule the follow up appointment   

## 2013-10-25 NOTE — Assessment & Plan Note (Signed)
Blood pressure control is good today. No changes made. 

## 2013-12-03 DIAGNOSIS — A6002 Herpesviral infection of other male genital organs: Secondary | ICD-10-CM | POA: Insufficient documentation

## 2013-12-03 DIAGNOSIS — N521 Erectile dysfunction due to diseases classified elsewhere: Secondary | ICD-10-CM | POA: Insufficient documentation

## 2013-12-10 ENCOUNTER — Other Ambulatory Visit: Payer: Self-pay | Admitting: Cardiology

## 2013-12-10 MED ORDER — METOPROLOL TARTRATE 25 MG PO TABS: 25.0000 mg | ORAL_TABLET | Freq: Two times a day (BID) | ORAL | Status: DC

## 2014-02-13 ENCOUNTER — Other Ambulatory Visit (HOSPITAL_COMMUNITY): Payer: Self-pay | Admitting: Pulmonary Disease

## 2014-02-13 DIAGNOSIS — M542 Cervicalgia: Secondary | ICD-10-CM

## 2014-02-13 DIAGNOSIS — Z9889 Other specified postprocedural states: Secondary | ICD-10-CM

## 2014-02-15 ENCOUNTER — Ambulatory Visit (HOSPITAL_COMMUNITY)
Admission: RE | Admit: 2014-02-15 | Discharge: 2014-02-15 | Disposition: A | Discharge status: Home / Self Care | Payer: Medicare Other | Source: Ambulatory Visit | Attending: Pulmonary Disease | Admitting: Pulmonary Disease

## 2014-02-15 DIAGNOSIS — R209 Unspecified disturbances of skin sensation: Secondary | ICD-10-CM | POA: Insufficient documentation

## 2014-02-15 DIAGNOSIS — M47812 Spondylosis without myelopathy or radiculopathy, cervical region: Secondary | ICD-10-CM | POA: Insufficient documentation

## 2014-02-15 DIAGNOSIS — M542 Cervicalgia: Secondary | ICD-10-CM | POA: Insufficient documentation

## 2014-02-15 DIAGNOSIS — Z9889 Other specified postprocedural states: Secondary | ICD-10-CM

## 2014-02-15 DIAGNOSIS — M25519 Pain in unspecified shoulder: Secondary | ICD-10-CM | POA: Insufficient documentation

## 2014-02-15 DIAGNOSIS — M5380 Other specified dorsopathies, site unspecified: Secondary | ICD-10-CM | POA: Insufficient documentation

## 2014-04-02 DIAGNOSIS — M542 Cervicalgia: Secondary | ICD-10-CM | POA: Insufficient documentation

## 2014-04-02 DIAGNOSIS — M47812 Spondylosis without myelopathy or radiculopathy, cervical region: Secondary | ICD-10-CM | POA: Insufficient documentation

## 2014-04-23 ENCOUNTER — Encounter: Payer: Self-pay | Admitting: Cardiology

## 2014-04-23 ENCOUNTER — Ambulatory Visit (INDEPENDENT_AMBULATORY_CARE_PROVIDER_SITE_OTHER): Payer: Medicare Other | Admitting: Cardiology

## 2014-04-23 VITALS — BP 134/75 | HR 59 | Ht 70.0 in | Wt 198.1 lb

## 2014-04-23 DIAGNOSIS — I2510 Atherosclerotic heart disease of native coronary artery without angina pectoris: Secondary | ICD-10-CM

## 2014-04-23 DIAGNOSIS — E785 Hyperlipidemia, unspecified: Secondary | ICD-10-CM

## 2014-04-23 DIAGNOSIS — I10 Essential (primary) hypertension: Secondary | ICD-10-CM

## 2014-04-23 DIAGNOSIS — N183 Chronic kidney disease, stage 3 unspecified: Secondary | ICD-10-CM

## 2014-04-23 NOTE — Assessment & Plan Note (Signed)
The patient has a single kidney. Lab work followed by Dr. Luan Pulling.

## 2014-04-23 NOTE — Patient Instructions (Signed)

## 2014-04-23 NOTE — Assessment & Plan Note (Signed)
Blood pressure is reasonable today. Keep followup with Dr. Luan Pulling.

## 2014-04-23 NOTE — Assessment & Plan Note (Signed)
Continues on Pravachol, followed by Dr. Luan Pulling.

## 2014-04-23 NOTE — Progress Notes (Signed)
Clinical Summary Mr. Caldarelli is a 68 y.o.male last seen in November 2014. From a cardiac perspective, he has been stable, does not endorse any angina or progressive shortness of breath. He spent most of the time today discussing other issues and concerns about his noncardiac medications.  He follows with Dr. Luan Pulling for primary care, reports visits approximately every 6 months. Lipids are followed by Dr. Luan Pulling as well.  Thallium myocardial perfusion study in May 2010 demonstrated small areas of ischemia at the apex and high lateral wall, managed medically.   Allergies  Allergen Reactions  . Motrin [Ibuprofen] Other (See Comments)    One kidney  . Tylenol [Acetaminophen] Other (See Comments)    One kidney    Current Outpatient Prescriptions  Medication Sig Dispense Refill  . allopurinol (ZYLOPRIM) 300 MG tablet Take 300 mg by mouth daily.        Marland Kitchen aspirin EC 81 MG tablet Take 81 mg by mouth daily.      Marland Kitchen buPROPion (WELLBUTRIN SR) 100 MG 12 hr tablet Take 100 mg by mouth 2 (two) times daily.      . diazepam (VALIUM) 10 MG tablet Take 10 mg by mouth every 12 (twelve) hours. For spasms      . furosemide (LASIX) 80 MG tablet Take 40 mg by mouth daily.       . metoprolol tartrate (LOPRESSOR) 25 MG tablet Take 1 tablet (25 mg total) by mouth 2 (two) times daily.  60 tablet  6  . nitroGLYCERIN (NITROSTAT) 0.4 MG SL tablet Place 0.4 mg under the tongue every 5 (five) minutes as needed. For chest pain      . Omega-3 Fatty Acids (FISH OIL MAXIMUM STRENGTH) 1200 MG CAPS Take 1 capsule by mouth daily.       Marland Kitchen omeprazole (PRILOSEC) 20 MG capsule Take 20 mg by mouth daily.        . pravastatin (PRAVACHOL) 80 MG tablet Take 80 mg by mouth daily.       No current facility-administered medications for this visit.    Past Medical History  Diagnosis Date  . Mixed hyperlipidemia   . Essential hypertension, benign   . GERD (gastroesophageal reflux disease)   . Arthritis   . Depression   .  Anxiety   . Coronary atherosclerosis of native coronary vessel     DES RCA 2007  . Myocardial infarction   . Peripheral vascular disease   . Chronic renal insufficiency     Single kidney  . Chronic idiopathic thrombocytopenia     Social History Mr. Conquest reports that he quit smoking about 20 years ago. His smoking use included Cigarettes. He has a 105 pack-year smoking history. He has never used smokeless tobacco. Mr. Kopper reports that he does not drink alcohol.  Review of Systems No palpitations or syncope. Reports problems with "impotence." Also left-sided neck and shoulder pain. He may be getting injections to treat this. No claudication. Otherwise negative.  Physical Examination Filed Vitals:   04/23/14 1107  BP: 134/75  Pulse: 59   Filed Weights   04/23/14 1107  Weight: 198 lb 1.9 oz (89.867 kg)    Appears comfortable at rest.  HEENT: Conjunctiva and lids normal, oropharynx clear with moist mucosa.  Neck: Supple, no elevated JVP or carotid bruits, no thyromegaly.  Lungs: Clear to auscultation, nonlabored breathing at rest.  Cardiac: Regular rate and rhythm, no S3 or significant systolic murmur, no pericardial rub.  Abdomen: Soft, nontender, bowel sounds  present, no guarding or rebound.  Extremities: No pitting edema, distal pulses 1+.   Problem List and Plan   CAD, NATIVE VESSEL Symptomatically stable on medical therapy. No changes were made today. Continue observation.  Essential hypertension, benign Blood pressure is reasonable today. Keep followup with Dr. Luan Pulling.  CKD (chronic kidney disease) stage 3, GFR 30-59 ml/min The patient has a single kidney. Lab work followed by Dr. Luan Pulling.  HYPERLIPIDEMIA-MIXED Continues on Pravachol, followed by Dr. Luan Pulling.    Satira Sark, M.D., F.A.C.C.

## 2014-04-23 NOTE — Assessment & Plan Note (Signed)
Symptomatically stable on medical therapy. No changes were made today. Continue observation.

## 2014-06-06 ENCOUNTER — Ambulatory Visit (HOSPITAL_COMMUNITY)
Admission: RE | Admit: 2014-06-06 | Discharge: 2014-06-06 | Disposition: A | Discharge status: Home / Self Care | Payer: Medicare Other | Source: Ambulatory Visit | Attending: Physical Medicine and Rehabilitation | Admitting: Physical Medicine and Rehabilitation

## 2014-06-06 DIAGNOSIS — IMO0001 Reserved for inherently not codable concepts without codable children: Secondary | ICD-10-CM | POA: Insufficient documentation

## 2014-06-06 DIAGNOSIS — M539 Dorsopathy, unspecified: Secondary | ICD-10-CM | POA: Insufficient documentation

## 2014-06-06 DIAGNOSIS — E782 Mixed hyperlipidemia: Secondary | ICD-10-CM | POA: Insufficient documentation

## 2014-06-06 DIAGNOSIS — M542 Cervicalgia: Secondary | ICD-10-CM | POA: Insufficient documentation

## 2014-06-06 DIAGNOSIS — I129 Hypertensive chronic kidney disease with stage 1 through stage 4 chronic kidney disease, or unspecified chronic kidney disease: Secondary | ICD-10-CM | POA: Insufficient documentation

## 2014-06-06 DIAGNOSIS — M2560 Stiffness of unspecified joint, not elsewhere classified: Secondary | ICD-10-CM

## 2014-06-06 DIAGNOSIS — N183 Chronic kidney disease, stage 3 unspecified: Secondary | ICD-10-CM | POA: Insufficient documentation

## 2014-06-06 NOTE — Evaluation (Signed)
Physical Therapy Evaluation  Patient Details  Name: Derek [name] MRN: [id number] Date of Birth: [birth year]  Today's Date: 06/06/2014 Time: [id number] PT Time Calculation (min): 38 min Charge:  Evaluation              Visit#: 1 of 4  Re-eval: 07/06/14 Assessment Diagnosis: cervical spndylosis Next MD Visit: 06/26/14 Prior Therapy: no   Authorization: [employer] medicare    Past Medical History:  Past Medical History  Diagnosis Date  . Mixed hyperlipidemia   . Essential hypertension, benign   . GERD (gastroesophageal reflux disease)   . Arthritis   . Depression   . Anxiety   . Coronary atherosclerosis of native coronary vessel     DES RCA 2007  . Myocardial infarction   . Peripheral vascular disease   . Chronic renal insufficiency     Single kidney  . Chronic idiopathic thrombocytopenia    Past Surgical History:  Past Surgical History  Procedure Laterality Date  . Hernia repair    . Total hip arthroplasty    . Tonsillectomy    . Cardiac catheterization  2010    Patent stent and nonobsturctive cad following an abnormal cardiolite study may of 2010 with an apical lateral defect  . No past surgeries      1962 BROKE LT HIP WAS PINNED  . Total hip arthroplasty  03/20/2012    Procedure: TOTAL HIP ARTHROPLASTY;  Surgeon: Jessy Oto, MD;  Location: [facility];  Service: Orthopedics;  Laterality: Left;  Left total hip replacement, ceramic on poly  . Hardware removal  03/20/2012    Procedure: HARDWARE REMOVAL;  Surgeon: Jessy Oto, MD;  Location: [facility];  Service: Orthopedics;  Laterality: Left;  Hardware removal 3 knowles pins left hip  . Joint replacement      left hip  . Carpal tunnel release  11/28/2012    Procedure: CARPAL TUNNEL RELEASE;  Surgeon: Jessy Oto, MD;  Location: [facility];  Service: Orthopedics;  Laterality: Left;  Left anterior submuscular transposition of ulnar nerve at elbow, left open carpal tunnel release    Subjective Symptoms/Limitations Symptoms: Pt states  that he has bone spurs as well as three bulging discs but it is not severe enough for surgery.  He was given the option of therapy or shots and he chose the shots but this only lasted two to three days and then the pain came back.  The patient is unable to take motrin due to only having one kidney.   The patient states that  he has pain on the left side of his neck especially when he is moving his head .   How long can you sit comfortably?: no difference How long can you stand comfortably?: no difference How long can you walk comfortably?: no difference Pain Assessment Currently in Pain?: No/denies (pain goes as high as a 7/10 when he turns his head)      Balance Screening Balance Screen Has the patient fallen in the past 6 months: No  Prior Functioning  Prior Function Driving: Yes (has difficult turning his head to the left.) Vocation: Retired Leisure: Hobbies-no     Sensation/Coordination/Flexibility/Functional Tests Functional Tests Functional Tests: foto 73  Assessment Cervical AROM Cervical Flexion: wnl reps no change  Cervical Extension: wnl reps  Cervical - Right Side Bend: wnl reps no change Cervical - Left Side Bend: wnl reps no chance Cervical - Right Rotation: decreased 20% Cervical - Left Rotation: decreased 50% Cervical Strength Cervical Extension: 5/5  Cervical - Right Side Bend: 4/5 Cervical - Left Side Bend: 4/5 Palpation Palpation: moderate to marked mm spasm B mid trapiezus area  Exercise/Treatments Mobility/Balance  Posture/Postural Control Posture/Postural Control: Postural limitations Postural Limitations: forward head, rounded shoulders     Seated Exercises Cervical Isometrics: Right lateral flexion;3 secs;5 reps Neck Retraction: 5 reps Lateral Flexion: Both;5 reps Shoulder Shrugs: 5 reps  Manual Therapy Manual Therapy: Other (comment) Other Manual Therapy: Pt recieved myofascial release for trapezius mm B as well as Grade II jt mobilization  to increase ROM  Physical Therapy Assessment and Plan PT Assessment and Plan Clinical Impression Statement: Pt  is a 68 yo male who has had progressive cervical pain for the last several months.   The patient has tried injections and is now being referred to therapy to address his concern of pain.  Exam demonstrateds decreased ROM, decreased strength, fair posture and mm spasms.  [name] will benefit fomr skilled PT to address these issues and maximize his quality of life.  Pt will benefit from skilled therapeutic intervention in order to improve on the following deficits: Decreased range of motion;Pain;Decreased strength;Increased muscle spasms;Increased fascial restricitons Rehab Potential: Good PT Frequency: Min 1X/week (per pt request ) PT Duration: 4 weeks PT Treatment/Interventions: Patient/family education;Manual techniques;Therapeutic exercise PT Plan: PT to be educated postural t-band exercises, x to v and W back continue with manual to increase ROM progress to prone exercises     Goals Home Exercise Program Pt/caregiver will Perform Home Exercise Program: For increased ROM;For increased strengthening PT Goal: Perform Home Exercise Program - Progress: Goal set today PT Short Term Goals Time to Complete Short Term Goals: 2 weeks PT Short Term Goal 1: Pain no greater than a 4/10 PT Short Term Goal 2: Pt Rotation to be improved 20% to improve safety of driving PT Short Term Goal 3: MM spasm decreased to mild PT Long Term Goals Time to Complete Long Term Goals: 4 weeks PT Long Term Goal 1: Pain to be no greater than a 1/10 PT Long Term Goal 2: Pt to be I in advance HEP Long Term Goal 3: Pt to have full rotation of cervical  for safe driving Long Term Goal 4: no mm spasm   Problem List Patient Active Problem List   Diagnosis Date Noted  . Cervical pain 06/06/2014  . Stiffness in joint 06/06/2014  . Cubital tunnel syndrome on left 11/28/2012    Class: Chronic  . Carpal  tunnel syndrome on left 11/28/2012    Class: Chronic  . CKD (chronic kidney disease) stage 3, GFR 30-59 ml/min 09/20/2012  . Essential hypertension, benign 09/20/2012  . HYPERLIPIDEMIA-MIXED 09/03/2009  . CAD, NATIVE VESSEL 09/03/2009    PT Plan of Care PT Home Exercise Plan: given  GP Functional Assessment Tool Used: fotot Functional Limitation: Other PT primary Other PT Primary Current Status [id number]): At least 20 percent but less than 40 percent impaired, limited or restricted Other PT Primary Goal Status [id number]): At least 1 percent but less than 20 percent impaired, limited or restricted  RUSSELL,CINDY 06/06/2014, 4:34 PM  Physician Documentation Your signature is required to indicate approval of the treatment plan as stated above.  Please sign and either send electronically or make a copy of this report for your files and return this physician signed original.   Please mark one 1.__approve of plan  2. ___approve of plan with the following conditions.   ______________________________  _____________________ Physician Signature                                                                                                             Date

## 2014-06-11 ENCOUNTER — Ambulatory Visit (HOSPITAL_COMMUNITY)
Admission: RE | Admit: 2014-06-11 | Discharge: 2014-06-11 | Disposition: A | Discharge status: Home / Self Care | Payer: Medicare Other | Source: Ambulatory Visit | Attending: Pulmonary Disease | Admitting: Pulmonary Disease

## 2014-06-11 NOTE — Progress Notes (Signed)
Physical Therapy Treatment Patient Details  Name: Derek [name] MRN: [id number] Date of Birth: July 31, 1946  Today's Date: 06/11/2014 Time: 1345-1425 PT Time Calculation (min): 40 min Charge:  There ex 1345-1402; manual 1403-1430 Visit#: 2 of 8  Re-eval: 07/06/14    Authorization: [employer] medicare  Subjective: Symptoms/Limitations Symptoms: Pt states he has been doing his exercises and can tell that he is able to turn his head better.  Pt initially was going to come only two treatements and try and do the rest at home but feels that he is improving to a point that he would like to continue therapy as prescribed by his MD Pain Assessment Currently in Pain?: Yes Pain Score: 4  Pain Location: Neck Pain Orientation: Right    Exercise/Treatments      Stretches Levator Stretch: 3 reps;30 seconds Neck Stretch: 3 reps;20 seconds   Theraband Exercises Scapula Retraction: 10 reps;Green Shoulder Extension: 10 reps;Green Rows: 10 reps;Green   Seated Exercises Neck Retraction: 10 reps X to V: 10 reps W Back: 10 reps Shoulder Rolls: Backwards;5 reps      Manual Therapy Manual Therapy: Myofascial release Other Manual Therapy: Jt mobilization as well as myofascial release to increase ROM   Physical Therapy Assessment and Plan PT Assessment and Plan Clinical Impression Statement: Pt has improved ROM although he states he is sore now.  Pt instructed in scapular stability exercises that were facilitated by therapist to ensure pt was not protracting with exercises.   PT Plan: Pt to begin chest stretch, UBE(backward), wall push-up and progress to prone exercises.  Continue with manual until full ROM is achieved.     Goals    Problem List Patient Active Problem List   Diagnosis Date Noted  . Cervical pain 06/06/2014  . Stiffness in joint 06/06/2014  . Cubital tunnel syndrome on left 11/28/2012    Class: Chronic  . Carpal tunnel syndrome on left 11/28/2012    Class: Chronic  . CKD  (chronic kidney disease) stage 3, GFR 30-59 ml/min 09/20/2012  . Essential hypertension, benign 09/20/2012  . HYPERLIPIDEMIA-MIXED 09/03/2009  . CAD, NATIVE VESSEL 09/03/2009       GP    RUSSELL,CINDY 06/11/2014, 3:53 PM

## 2014-06-18 ENCOUNTER — Ambulatory Visit (HOSPITAL_COMMUNITY)
Admission: RE | Admit: 2014-06-18 | Discharge: 2014-06-18 | Disposition: A | Discharge status: Home / Self Care | Payer: Medicare Other | Source: Ambulatory Visit | Attending: Physical Medicine and Rehabilitation | Admitting: Physical Medicine and Rehabilitation

## 2014-06-18 DIAGNOSIS — N183 Chronic kidney disease, stage 3 unspecified: Secondary | ICD-10-CM | POA: Diagnosis not present

## 2014-06-18 DIAGNOSIS — E782 Mixed hyperlipidemia: Secondary | ICD-10-CM | POA: Diagnosis not present

## 2014-06-18 DIAGNOSIS — IMO0001 Reserved for inherently not codable concepts without codable children: Secondary | ICD-10-CM | POA: Diagnosis not present

## 2014-06-18 DIAGNOSIS — I129 Hypertensive chronic kidney disease with stage 1 through stage 4 chronic kidney disease, or unspecified chronic kidney disease: Secondary | ICD-10-CM | POA: Diagnosis not present

## 2014-06-18 DIAGNOSIS — M542 Cervicalgia: Secondary | ICD-10-CM | POA: Diagnosis not present

## 2014-06-18 DIAGNOSIS — M539 Dorsopathy, unspecified: Secondary | ICD-10-CM | POA: Diagnosis not present

## 2014-06-18 NOTE — Progress Notes (Signed)
Physical Therapy Treatment Patient Details  Name: Derek [name] MRN: [id number] Date of Birth: 1946-05-18  Today's Date: 06/18/2014 Time: [id number] PT Time Calculation (min): 40 min Visit#: 3 of 8  Re-eval: 07/06/14 Authorization: [facility] medicare  Charges:  therex [id number] (25'), manual [id number] (15')   Subjective: Symptoms/Limitations Symptoms: PT states he only has pain when he rotates his head to the Lt then increases to 7/10 pain.  Currently wihtout pain. Pain Assessment Currently in Pain?: No/denies   Exercise/Treatments Stretches Corner Stretch: 3 reps;20 seconds Machines for Strengthening UBE (Upper Arm Bike): 4 minutes backward level3 Theraband Exercises Scapula Retraction: 10 reps;Green Shoulder Extension: 10 reps;Green Rows: 10 reps;Green Standing Exercises Other Standing Exercises: wall push up 10X Seated Exercises X to V: 10 reps W Back: 10 reps   Manual Therapy Manual Therapy: Myofascial release Massage: seated cervical region   Physical Therapy Assessment and Plan PT Assessment:  Pt able to complete exercises without c/o pain.  Added corner stretch, wall push ups and UBE with good form after demonstration.  Large spasm in Lt UT region resolved 75% with manual techniques.  Improved ROM with less pain following therex and manual techniques.   PT Plan: Per PT, Continue with manual until full ROM is achieved.   Progress to prone strengthening exercise.     Problem List Patient Active Problem List   Diagnosis Date Noted  . Cervical pain 06/06/2014  . Stiffness in joint 06/06/2014  . Cubital tunnel syndrome on left 11/28/2012    Class: Chronic  . Carpal tunnel syndrome on left 11/28/2012    Class: Chronic  . CKD (chronic kidney disease) stage 3, GFR 30-59 ml/min 09/20/2012  . Essential hypertension, benign 09/20/2012  . HYPERLIPIDEMIA-MIXED 09/03/2009  . CAD, NATIVE VESSEL 09/03/2009       GP Functional Assessment Tool Used: foto  Teena Irani,  PTA/CLT 06/18/2014, 2:57 PM

## 2014-06-25 ENCOUNTER — Ambulatory Visit (HOSPITAL_COMMUNITY)
Admission: RE | Admit: 2014-06-25 | Discharge: 2014-06-25 | Disposition: A | Discharge status: Home / Self Care | Payer: Medicare Other | Source: Ambulatory Visit | Attending: Pulmonary Disease | Admitting: Pulmonary Disease

## 2014-06-25 DIAGNOSIS — IMO0001 Reserved for inherently not codable concepts without codable children: Secondary | ICD-10-CM | POA: Diagnosis not present

## 2014-06-25 NOTE — Progress Notes (Signed)
Physical Therapy Treatment Patient Details  Name: Derek [name] MRN: [id number] Date of Birth: [birth year]  Today's Date: 06/25/2014 Time: [id number] PT Time Calculation (min): 41 min  Visit#: 4 of 8  Re-eval: 07/06/14 Authorization: [employer] medicare  Authorization Visit#: 4 of 10  Charges:  therex [id number] (28'), manual [id number] (12')  Subjective: Symptoms/Limitations Symptoms: Pt states he is getting better.  Reports no pain today.   Pain Assessment Currently in Pain?: No/denies  Exercise/Treatments Stretches Corner Stretch: 3 reps;20 seconds Machines for Strengthening UBE (Upper Arm Bike): 4 minutes backward level3 Standing Exercises Other Standing Exercises: wall push up 15X Seated Exercises Neck Retraction: 10 reps X to V: 15 reps W Back: 15 reps Prone Exercises W Back: 10 reps Shoulder Extension: 10 reps Rows: 10 reps    Manual Therapy Manual Therapy: Myofascial release Massage: seated cervical region Lt>Rt  Physical Therapy Assessment and Plan PT Assessment:  Began prone stabilization and postural strengthening exercises this session. Pt able to complete without pain and minimal verbal cues.  Noted reduction in spasm in Lt cervical region today.  Overall less guarding and improved mobility.  Compliance reported with HEP and theraband exercises.  PT Plan: Per PT, Continue with manual until full ROM is achieved.   Progress UE and cervical strengthening exercise.     Problem List Patient Active Problem List   Diagnosis Date Noted  . Cervical pain 06/06/2014  . Stiffness in joint 06/06/2014  . Cubital tunnel syndrome on left 11/28/2012    Class: Chronic  . Carpal tunnel syndrome on left 11/28/2012    Class: Chronic  . CKD (chronic kidney disease) stage 3, GFR 30-59 ml/min 09/20/2012  . Essential hypertension, benign 09/20/2012  . HYPERLIPIDEMIA-MIXED 09/03/2009  . CAD, NATIVE VESSEL 09/03/2009      GP Functional Assessment Tool Used: foto  Teena Irani, PTA/CLT 06/25/2014, 3:17 PM

## 2014-07-02 ENCOUNTER — Ambulatory Visit (HOSPITAL_COMMUNITY)
Admission: RE | Admit: 2014-07-02 | Discharge: 2014-07-02 | Disposition: A | Discharge status: Home / Self Care | Payer: Medicare Other | Source: Ambulatory Visit | Attending: Pulmonary Disease | Admitting: Pulmonary Disease

## 2014-07-02 DIAGNOSIS — IMO0001 Reserved for inherently not codable concepts without codable children: Secondary | ICD-10-CM | POA: Diagnosis not present

## 2014-07-02 NOTE — Progress Notes (Signed)
Physical Therapy Treatment Patient Details  Name: LERIN CHAPPLE MRN: [id number] Date of Birth: [birth year]  Today's Date: 07/02/2014 Time: 1350-1430 PT Time Calculation (min): 40 min Visit#: 5 of 8  Re-eval: 07/06/14 Authorization: [employer] medicare  Authorization Visit#: 5 of 10  Charges:  therex [id number] (28'), manual 1420-1430 (10')  Subjective: Symptoms/Limitations Symptoms: Pt states he feels better after therapy, however feels he may still have to have surgery or further intervention.  States his bad pain is less frequent and reports he is sleeping better.  Pain Assessment Currently in Pain?: No/denies   Exercise/Treatments Stretches Corner Stretch: 3 reps;20 seconds Machines for Strengthening UBE (Upper Arm Bike): 4 minutes backward level3 Standing Exercises Other Standing Exercises: wall push up 15X Seated Exercises Neck Retraction: 10 reps X to V: 15 reps W Back: 15 reps Shoulder Flexion: 10 reps Shoulder ABduction: 10 reps Postural Training: for sitting and ambulation  Manual Therapy Manual Therapy: Myofascial release Massage: seated cervical region Lt>Rt  Physical Therapy Assessment and Plan PT Assessment and Plan Clinical Impression Statement: Able to completely resolve 2 spasms in Lt UT region today with massage.  Pt with improving form with therex, however requires postural cues.  Pt instructed with correct posture during ambulation and seated positions.  Pt able to demonstrate appropriately.   PT Plan: Per PT, Continue with manual until full ROM is achieved.   Progress UE and cervical strengthening exercise.  Re-evaluate next visit.   Problem List Patient Active Problem List   Diagnosis Date Noted  . Cervical pain 06/06/2014  . Stiffness in joint 06/06/2014  . Cubital tunnel syndrome on left 11/28/2012    Class: Chronic  . Carpal tunnel syndrome on left 11/28/2012    Class: Chronic  . CKD (chronic kidney disease) stage 3, GFR 30-59 ml/min 09/20/2012   . Essential hypertension, benign 09/20/2012  . HYPERLIPIDEMIA-MIXED 09/03/2009  . CAD, NATIVE VESSEL 09/03/2009       GP Functional Assessment Tool Used: foto  Teena Irani, PTA/CLT ]07/02/2014, 2:35 PM

## 2014-07-09 ENCOUNTER — Ambulatory Visit (HOSPITAL_COMMUNITY)
Admission: RE | Admit: 2014-07-09 | Discharge: 2014-07-09 | Disposition: A | Discharge status: Home / Self Care | Payer: Medicare Other | Source: Ambulatory Visit | Attending: Pulmonary Disease | Admitting: Pulmonary Disease

## 2014-07-09 DIAGNOSIS — IMO0001 Reserved for inherently not codable concepts without codable children: Secondary | ICD-10-CM | POA: Diagnosis not present

## 2014-07-09 NOTE — Evaluation (Signed)
Physical Therapy Evaluation  Patient Details  Name: Derek [name] MRN: [id number] Date of Birth: 23-Sep-1946  Today's Date: 07/09/2014 Time: 1303-1359 PT Time Calculation (min): 56 min              Visit#: 6 of 10  Re-eval:   Assessment Diagnosis: cervical spndylosis Prior Therapy: no  Authorization Visit#: 6 of 10   Past Medical History:  Past Medical History  Diagnosis Date  . Mixed hyperlipidemia   . Essential hypertension, benign   . GERD (gastroesophageal reflux disease)   . Arthritis   . Depression   . Anxiety   . Coronary atherosclerosis of native coronary vessel     DES RCA 2007  . Myocardial infarction   . Peripheral vascular disease   . Chronic renal insufficiency     Single kidney  . Chronic idiopathic thrombocytopenia    Past Surgical History:  Past Surgical History  Procedure Laterality Date  . Hernia repair    . Total hip arthroplasty    . Tonsillectomy    . Cardiac catheterization  2010    Patent stent and nonobsturctive cad following an abnormal cardiolite study may of 2010 with an apical lateral defect  . No past surgeries      1962 BROKE LT HIP WAS PINNED  . Total hip arthroplasty  03/20/2012    Procedure: TOTAL HIP ARTHROPLASTY;  Surgeon: Jessy Oto, MD;  Location: [place];  Service: Orthopedics;  Laterality: Left;  Left total hip replacement, ceramic on poly  . Hardware removal  03/20/2012    Procedure: HARDWARE REMOVAL;  Surgeon: Jessy Oto, MD;  Location: [place];  Service: Orthopedics;  Laterality: Left;  Hardware removal 3 knowles pins left hip  . Joint replacement      left hip  . Carpal tunnel release  11/28/2012    Procedure: CARPAL TUNNEL RELEASE;  Surgeon: Jessy Oto, MD;  Location: [facility];  Service: Orthopedics;  Laterality: Left;  Left anterior submuscular transposition of ulnar nerve at elbow, left open carpal tunnel release    Subjective Symptoms/Limitations Symptoms: Pt states his neck will feel better for a day or so and then  the pain returns.   Pain Assessment Currently in Pain?: No/denies Pain Score:  (worst pain in the past week 8/10 ) Pain Location: Neck Pain Orientation: Left Pain Type: Chronic pain    Prior Functioning  Prior Function Driving: Yes (has difficult turning his head to the left.) Vocation: Retired Leisure: Hobbies-no  Assessment Cervical AROM Cervical Flexion: wnl reps no change  Cervical Extension: wnl reps  Cervical - Right Side Bend: wnl reps no change Cervical - Left Side Bend: wnl reps no chance Cervical - Right Rotation: wnl (was decreased 20%) Cervical - Left Rotation: decreased 50% Cervical Strength Cervical Extension: 5/5 Cervical - Right Side Bend: 5/5 Cervical - Left Side Bend: 4/5 Palpation Palpation: mild spasm mid trap   Exercise/Treatments Mobility/Balance  Posture/Postural Control Posture/Postural Control: Postural limitations Postural Limitations: forward head, rounded shoulders  (Pt has increased awareness of his posture. )     Prone Exercises Axial Exentsion: 10 reps W Back: 10 reps Shoulder Extension: 10 reps Rows: 10 reps  Modalities Modalities: Moist Heat;Traction Moist Heat Therapy Number Minutes Moist Heat: 15 Minutes Moist Heat Location:  (cervical) Traction Type of Traction: Cervical Min (lbs): 15 Hold Time: static Time: 12 min with 4 step up 4 step down   Physical Therapy Assessment and Plan PT Assessment and Plan Clinical Impression Statement: Pt  has chronic neck pain and has improved but continues to have pain and decreased ROM.  Pt began on traction today and will benefit from advanced cervical stabilization exercises.   PT Plan: emphasis Lt rotation and strengthening.  Begin sidelying SB activity as well as standing with UE flexion.  Continue with cervical traction if  pt finds it beneficial may increase traction wt to 17 next treatment.    Continue 2x week for 3 more weeks  Goals Home Exercise Program Pt/caregiver will Perform  Home Exercise Program: For increased ROM;For increased strengthening PT Short Term Goals PT Short Term Goal 1: Pain no greater than a 4/10 PT Short Term Goal 1 - Progress: Not met PT Short Term Goal 2: Pt Rotation to be improved 20% to improve safety of driving PT Short Term Goal 2 - Progress: Progressing toward goal PT Short Term Goal 3: MM spasm decreased to mild PT Short Term Goal 3 - Progress: Met PT Long Term Goals PT Long Term Goal 1: Pain to be no greater than a 1/10 PT Long Term Goal 1 - Progress: Progressing toward goal PT Long Term Goal 2: Pt to be I in advance HEP PT Long Term Goal 2 - Progress: Met Long Term Goal 3: Pt to have full rotation of cervical  for safe driving (rotation to rt is full to Lt is still decreased 20%) Long Term Goal 3 Progress: Progressing toward goal Long Term Goal 4: no mm spasm   Problem List Patient Active Problem List   Diagnosis Date Noted  . Cervical pain 06/06/2014  . Stiffness in joint 06/06/2014  . Cubital tunnel syndrome on left 11/28/2012    Class: Chronic  . Carpal tunnel syndrome on left 11/28/2012    Class: Chronic  . CKD (chronic kidney disease) stage 3, GFR 30-59 ml/min 09/20/2012  . Essential hypertension, benign 09/20/2012  . HYPERLIPIDEMIA-MIXED 09/03/2009  . CAD, NATIVE VESSEL 09/03/2009       GP    RUSSELL,CINDY 07/09/2014, 1:54 PM  Physician Documentation Your signature is required to indicate approval of the treatment plan as stated above.  Please sign and either send electronically or make a copy of this report for your files and return this physician signed original.   Please mark one 1.__approve of plan  2. ___approve of plan with the following conditions.   ______________________________                                                          _____________________ Physician Signature                                                                                                             Date  

## 2014-07-15 ENCOUNTER — Other Ambulatory Visit: Payer: Self-pay | Admitting: *Deleted

## 2014-07-15 MED ORDER — METOPROLOL TARTRATE 25 MG PO TABS: 25.0000 mg | ORAL_TABLET | Freq: Two times a day (BID) | ORAL | Status: DC

## 2014-07-16 ENCOUNTER — Ambulatory Visit (HOSPITAL_COMMUNITY)
Admission: RE | Admit: 2014-07-16 | Discharge: 2014-07-16 | Disposition: A | Discharge status: Home / Self Care | Payer: Medicare Other | Source: Ambulatory Visit | Attending: Physical Medicine and Rehabilitation | Admitting: Physical Medicine and Rehabilitation

## 2014-07-16 DIAGNOSIS — M542 Cervicalgia: Secondary | ICD-10-CM | POA: Insufficient documentation

## 2014-07-16 DIAGNOSIS — M539 Dorsopathy, unspecified: Secondary | ICD-10-CM | POA: Insufficient documentation

## 2014-07-16 DIAGNOSIS — N183 Chronic kidney disease, stage 3 unspecified: Secondary | ICD-10-CM | POA: Insufficient documentation

## 2014-07-16 DIAGNOSIS — E782 Mixed hyperlipidemia: Secondary | ICD-10-CM | POA: Diagnosis not present

## 2014-07-16 DIAGNOSIS — I129 Hypertensive chronic kidney disease with stage 1 through stage 4 chronic kidney disease, or unspecified chronic kidney disease: Secondary | ICD-10-CM | POA: Insufficient documentation

## 2014-07-16 DIAGNOSIS — IMO0001 Reserved for inherently not codable concepts without codable children: Secondary | ICD-10-CM | POA: Diagnosis present

## 2014-07-16 NOTE — Progress Notes (Signed)
Physical Therapy Treatment Patient Details  Name: Derek [name] MRN: [id number] Date of Birth: 1946/08/15  Today's Date: 07/16/2014 Time: 1312-1418 PT Time Calculation (min): 36 min 38' TE 15' heat 13' manual  Visit#: 7 of 10  Re-eval: 07/06/14    Authorization: [employer] medicare  Authorization Time Period:    Authorization Visit#: 7 of 10   Subjective: Symptoms/Limitations Symptoms: Patient states no pain today Pain Assessment Currently in Pain?: No/denies     Exercise/Treatments       Stretches Warehouse manager: 3 reps;20 seconds Machines for Strengthening UBE (Upper Arm Bike): 5 minutes backward level3 Theraband Exercises   Standing Exercises Upper Extremity Flexion with Stabilization: 10 reps Other Standing Exercises: wall push up 10x Seated Exercises Neck Retraction: 10 reps X to V: 15 reps;10 reps Shoulder ABduction: 15 reps    Sidelying Exercises Lateral Flexion: Left;Limitations (prone for rside with isometric due to hip prothesis) Prone Exercises Axial Exentsion: 10 reps W Back: 10 reps     Physical Therapy Assessment and Plan PT Assessment and Plan Clinical Impression Statement:  Patient declined cervical traction as he states after last session with it he had 9/10 cervical pain for the few days. Focus was cervical stabilization exc and manual therapy. Pt tolerated addition of S/L SB neck exercises and UE flexion against wall well;minimal cues for proper technique PT Plan: emphasis Lt rotation and strengthening; Continue with PT POC    Goals    Problem List Patient Active Problem List   Diagnosis Date Noted  . Cervical pain 06/06/2014  . Stiffness in joint 06/06/2014  . Cubital tunnel syndrome on left 11/28/2012    Class: Chronic  . Carpal tunnel syndrome on left 11/28/2012    Class: Chronic  . CKD (chronic kidney disease) stage 3, GFR 30-59 ml/min 09/20/2012  . Essential hypertension, benign 09/20/2012  . HYPERLIPIDEMIA-MIXED 09/03/2009  .  CAD, NATIVE VESSEL 09/03/2009    PT - End of Session Activity Tolerance: Patient tolerated treatment well  GP    [name], [place] 07/16/2014, 4:21 PM

## 2014-07-25 ENCOUNTER — Ambulatory Visit (HOSPITAL_COMMUNITY)
Admission: RE | Admit: 2014-07-25 | Discharge: 2014-07-25 | Disposition: A | Discharge status: Home / Self Care | Payer: Medicare Other | Source: Ambulatory Visit | Attending: Pulmonary Disease | Admitting: Pulmonary Disease

## 2014-07-25 DIAGNOSIS — IMO0001 Reserved for inherently not codable concepts without codable children: Secondary | ICD-10-CM | POA: Diagnosis not present

## 2014-07-25 NOTE — Progress Notes (Signed)
Physical Therapy Treatment Patient Details  Name: ZHAMIR TOMCZYK MRN: [id number] Date of Birth: 1945/12/16  Today's Date: 07/25/2014 Time: [id number] PT Time Calculation (min): 48 min TE [id number], MHP [id number], MT [id number]  Visit#: 8 of 10  Re-eval: 08/06/14    Subjective: Symptoms/Limitations Symptoms: Pt reports no complaints of pain in the cervical spine unless he rotates head to the Lt, pain increases to 5-6/10.  Pain Assessment Currently in Pain?: No/denies   Exercise/Treatments Stretches Levator Stretch: 3 reps;30 seconds Corner Stretch: 3 reps;30 seconds Machines for Strengthening UBE (Upper Arm Bike): 6' Backward, Lv. 3 Theraband Exercises Shoulder Extension: 20 reps;Blue Rows: 20 reps;Blue Other Theraband Exercises: Lat Pull Downs, BTB, x20 Standing Exercises Other Standing Exercises: Wall Push Up, 2x10 Supine Exercises Neck Retraction: 10 reps;5 secs  Modalities Modalities: Moist Heat Manual Therapy Manual Therapy: Myofascial release Joint Mobilization: Cervical A/A, O/A nodding Myofascial Release: STT -> DTT to Lt UT/levator, cervical region to decrease muscle tone, TP release Moist Heat Therapy Number Minutes Moist Heat: 5 Minutes Moist Heat Location: Other (comment) (Cervical)  Physical Therapy Assessment and Plan PT Assessment and Plan Clinical Impression Statement: Added theraband exercises for scapular stabilization without complaints of pain.  Some VC and TC required for technique to avoid compensations of shoulder elevation and scapular protraction for momentum to complete movement.  Educated pt throughout treatment on importance of posutre during everyday activities and therapeutic exercises to place muscles/joints in proper alignement and decrease stress to tissues.  Pt reports decrease in symptoms of pain, with pain to 2/10 with Lt cervical rotation after exercises and MT treatment.   Pt will benefit from skilled therapeutic intervention in order  to improve on the following deficits: Decreased range of motion;Pain;Decreased strength;Increased muscle spasms;Increased fascial restricitons Rehab Potential: Good PT Frequency: Min 1X/week PT Duration: 4 weeks PT Treatment/Interventions: Patient/family education;Manual techniques;Therapeutic exercise PT Plan: Progress exercises to increase cervical movement and scapular strengthening.      Problem List Patient Active Problem List   Diagnosis Date Noted  . Cervical pain 06/06/2014  . Stiffness in joint 06/06/2014  . Cubital tunnel syndrome on left 11/28/2012    Class: Chronic  . Carpal tunnel syndrome on left 11/28/2012    Class: Chronic  . CKD (chronic kidney disease) stage 3, GFR 30-59 ml/min 09/20/2012  . Essential hypertension, benign 09/20/2012  . HYPERLIPIDEMIA-MIXED 09/03/2009  . CAD, NATIVE VESSEL 09/03/2009    PT - End of Session Activity Tolerance: Patient tolerated treatment well   Naphtali Zywicki 07/25/2014, 2:57 PM

## 2014-07-30 ENCOUNTER — Ambulatory Visit (HOSPITAL_COMMUNITY)
Admission: RE | Admit: 2014-07-30 | Discharge: 2014-07-30 | Disposition: A | Discharge status: Home / Self Care | Payer: Medicare Other | Source: Ambulatory Visit | Attending: Physical Medicine and Rehabilitation | Admitting: Physical Medicine and Rehabilitation

## 2014-07-30 DIAGNOSIS — IMO0001 Reserved for inherently not codable concepts without codable children: Secondary | ICD-10-CM | POA: Diagnosis not present

## 2014-07-30 NOTE — Progress Notes (Signed)
Physical Therapy Treatment Patient Details   .  Name: Derek [name] MRN: [id number] Date of Birth: 06/21/1946  Today's Date: 07/30/2014 Time: 1300-1345 PT Time Calculation (min): 45 min Visit#: 9 of 10  Re-eval: 08/06/14 Authorization Visit#: 9 of 10  Charges:  therex [id number], manual [id number]  Subjective:  Pt states minimal discomfort today, 3/10.    Exercise/Treatments Environmental education officer: 3 reps;30 seconds Machines for Strengthening UBE (Upper Arm Bike): 6' Backward, Lv. 3 Theraband Exercises Shoulder Extension: 20 reps;Blue Rows: 20 reps;Blue Standing Exercises Other Standing Exercises: Wall Push Up, 2x10  Manual Therapy Manual Therapy: Massage Massage: seated cervical region Lt>Rt  Physical Therapy Assessment and Plan PT Assessment and Plan Clinical Impression Statement: Pt with overall improvements and reports independence with HEP.  Pt feels he should be ready for discharge next visit.  Overall reduction of spasms bilateral cervical region.  No Pain with ease of mostion at end of session. PT Plan: Re-evaluate next visit with anticipation of  discharge.     Problem List Patient Active Problem List   Diagnosis Date Noted  . Cervical pain 06/06/2014  . Stiffness in joint 06/06/2014  . Cubital tunnel syndrome on left 11/28/2012    Class: Chronic  . Carpal tunnel syndrome on left 11/28/2012    Class: Chronic  . CKD (chronic kidney disease) stage 3, GFR 30-59 ml/min 09/20/2012  . Essential hypertension, benign 09/20/2012  . HYPERLIPIDEMIA-MIXED 09/03/2009  . CAD, NATIVE VESSEL 09/03/2009    PT - End of Session Activity Tolerance: Patient tolerated treatment well   Teena Irani, PTA/CLT 07/30/2014, 1:56 PM

## 2014-08-01 ENCOUNTER — Ambulatory Visit (HOSPITAL_COMMUNITY)
Admission: RE | Admit: 2014-08-01 | Discharge: 2014-08-01 | Disposition: A | Discharge status: Home / Self Care | Payer: Medicare Other | Source: Ambulatory Visit | Attending: Pulmonary Disease | Admitting: Pulmonary Disease

## 2014-08-01 NOTE — Progress Notes (Signed)
Physical Therapy Discharge Patient Details  Name: Derek [name] MRN: [id number] Date of Birth: 08/24/1946  Today's Date: 08/01/2014 Time: 1303-1353 PT Time Calculation (min): 50 min TE 1303-1320, MMT x1, ROM x1  Re-eval:   Assessment Diagnosis: cervical spndylosis   Subjective: Pain Assessment Pain Score: 0-No pain (Best 0/10, Work 8/10 in morning secondary to stiffness)  Exercise/Treatments Stretches Levator Stretch: 3 reps;30 seconds Corner Stretch: 3 reps;30 seconds Other Neck Stretches: Cervical Rotation, x10 10" Machines for Strengthening UBE (Upper Arm Bike): 4 minute, 30 seconds Backward, Lv. 4  Physical Therapy Assessment and Plan PT Assessment and Plan Clinical Impression Statement: Pt to be discharged today to (I) HEP. Pt reports no questions with HEP, and feels he is able to complete all exercises at home without assistance. Updated goals with pt meeting 3/3 STG, and 3/4 LTG.  Only goal not met for pain; pt reports complaints of moderate pain in the morning upon waking due to stiffness, though with exercises and movement no pain throughout the day.  Educated pt on importance of continued performance of exercise program after d/c from therapy to continue to progress and maintain ROM and strength.   PT Plan: D/C to (I) HEP.      08/01/14 1300  Assessment  Diagnosis cervical spndylosis  Functional Tests  Functional Tests FOTO 73 (was 73)  Cervical AROM  Cervical Flexion WNL (was WNL)  Cervical Extension WNL (was WNL)  Cervical - Right Side Bend WNL (was WNL)  Cervical - Left Side Bend WNL (was WNL)  Cervical - Right Rotation 60  Cervical - Left Rotation 55 ((was decreased by 50%))  Cervical Strength  Cervical Extension 5/5 ((was 5/5))  Cervical - Right Side Bend 5/5 ((was 5/5))  Cervical - Left Side Bend 5/5 ((was 5/5))  Palpation  Palpation No tone noted.  ((previously mild spasms noted) )     Goals Home Exercise Program Pt/caregiver will Perform  Home Exercise Program: For increased ROM;For increased strengthening PT Goal: Perform Home Exercise Program - Progress: Met PT Short Term Goals PT Short Term Goal 1: Pain no greater than a 4/10 PT Short Term Goal 1 - Progress: Met PT Short Term Goal 2: Pt Rotation to be improved 20% to improve safety of driving PT Short Term Goal 2 - Progress: Met PT Short Term Goal 3: MM spasm decreased to mild PT Short Term Goal 3 - Progress: Met PT Long Term Goals PT Long Term Goal 1: Pain to be no greater than a 1/10 PT Long Term Goal 1 - Progress: Progressing toward goal (Met for everyday activities, not met for stiffness in the morning) PT Long Term Goal 2: Pt to be I in advance HEP PT Long Term Goal 2 - Progress: Met Long Term Goal 3: Pt to have full rotation of cervical  for safe driving Long Term Goal 3 Progress: Met Long Term Goal 4: no mm spasm  Long Term Goal 4 Progress: Met  Problem List Patient Active Problem List   Diagnosis Date Noted  . Cervical pain 06/06/2014  . Stiffness in joint 06/06/2014  . Cubital tunnel syndrome on left 11/28/2012    Class: Chronic  . Carpal tunnel syndrome on left 11/28/2012    Class: Chronic  . CKD (chronic kidney disease) stage 3, GFR 30-59 ml/min 09/20/2012  . Essential hypertension, benign 09/20/2012  . HYPERLIPIDEMIA-MIXED 09/03/2009  . CAD, NATIVE VESSEL 09/03/2009    PT - End of Session Activity Tolerance: Patient tolerated treatment well   WOODWORTH,STEPHANIE   08/01/2014, 2:47 PM

## 2014-09-17 NOTE — Addendum Note (Signed)
Encounter addended by: Debby Bud, OT on: 09/17/2014  9:55 AM<BR>     Documentation filed: Episodes

## 2014-10-01 ENCOUNTER — Other Ambulatory Visit: Payer: Self-pay | Admitting: Dermatology

## 2014-10-17 ENCOUNTER — Encounter: Payer: Self-pay | Admitting: Internal Medicine

## 2014-11-04 ENCOUNTER — Encounter: Payer: Self-pay | Admitting: Cardiology

## 2014-11-04 ENCOUNTER — Ambulatory Visit (INDEPENDENT_AMBULATORY_CARE_PROVIDER_SITE_OTHER): Payer: Medicare Other | Admitting: Cardiology

## 2014-11-04 VITALS — BP 148/75 | HR 53 | Ht 70.0 in | Wt 195.8 lb

## 2014-11-04 DIAGNOSIS — I10 Essential (primary) hypertension: Secondary | ICD-10-CM

## 2014-11-04 DIAGNOSIS — E782 Mixed hyperlipidemia: Secondary | ICD-10-CM

## 2014-11-04 DIAGNOSIS — I2510 Atherosclerotic heart disease of native coronary artery without angina pectoris: Secondary | ICD-10-CM

## 2014-11-04 MED ORDER — NITROGLYCERIN 0.4 MG SL SUBL: 0.4000 mg | SUBLINGUAL_TABLET | SUBLINGUAL | Status: DC | PRN

## 2014-11-04 NOTE — Assessment & Plan Note (Signed)
Blood pressure elevated today. Continue current dose of beta blocker.Keep follow-up with Dr. Luan Pulling, as additional blood pressure control may be needed. I am requesting most recent lab work for review as well.

## 2014-11-04 NOTE — Progress Notes (Signed)
Reason for visit: CAD, hypertension, hyperlipidemia  Clinical Summary Derek [name] is a 68 y.o.male last seen in May. He presents today for a routine visit. He does report intermittent angina symptoms, describes a "pulling" in his left chest that tends to get better when he takes nitroglycerin. He does not report any specific increase in frequency or intensity. Today we discussed his last heart catheterization in 2010 and reviewed his medical therapy which includes aspirin, beta blocker,nitroglycerin, and statin.  ECG today shows sinus bradycardia with old inferior infarct pattern.  Thallium myocardial perfusion study in May 2010 demonstrated small areas of ischemia at the apex and high lateral wall. Cardiac catheterization demonstrated patent stent site in the RCA with 40% mid RCA stenosis, 50% circumflex stenosis, and minor atherosclerosis within the LAD.  Today we discussed possible options including adding a long-acting nitrate (which he declined), follow-up Lexiscan Cardiolite to reassess ischemic burden (which he also declined), and the potential for heart catheterization if his symptoms escalate on medical therapy. For now he chose to continue medical therapy and observation.  Allergies  Allergen Reactions  . Motrin [Ibuprofen] Other (See Comments)    One kidney  . Tylenol [Acetaminophen] Other (See Comments)    One kidney    Current Outpatient Prescriptions  Medication Sig Dispense Refill  . allopurinol (ZYLOPRIM) 300 MG tablet Take 300 mg by mouth daily.      Marland Kitchen aspirin EC 81 MG tablet Take 81 mg by mouth daily.    Marland Kitchen buPROPion (WELLBUTRIN SR) 100 MG 12 hr tablet Take 100 mg by mouth 2 (two) times daily.    . diazepam (VALIUM) 10 MG tablet Take 10 mg by mouth every 12 (twelve) hours. For spasms    . furosemide (LASIX) 80 MG tablet Take 40 mg by mouth daily.     . metoprolol tartrate (LOPRESSOR) 25 MG tablet Take 1 tablet (25 mg total) by mouth 2 (two) times daily. 60 tablet 6  .  nitroGLYCERIN (NITROSTAT) 0.4 MG SL tablet Place 1 tablet (0.4 mg total) under the tongue every 5 (five) minutes x 3 doses as needed. For chest pain. If no relief after 3 rd dose, proceed to the ED for an evaluation. 25 tablet 3  . Omega-3 Fatty Acids (FISH OIL MAXIMUM STRENGTH) 1200 MG CAPS Take 1 capsule by mouth daily.     Marland Kitchen omeprazole (PRILOSEC) 20 MG capsule Take 20 mg by mouth daily.      . pravastatin (PRAVACHOL) 80 MG tablet Take 80 mg by mouth daily.     No current facility-administered medications for this visit.    Past Medical History  Diagnosis Date  . Mixed hyperlipidemia   . Essential hypertension, benign   . GERD (gastroesophageal reflux disease)   . Arthritis   . Depression   . Anxiety   . Coronary atherosclerosis of native coronary vessel     DES RCA 2007  . Myocardial infarction   . Peripheral vascular disease   . Chronic renal insufficiency     Single kidney  . Chronic idiopathic thrombocytopenia     Social History Mr. Posthuma reports that he quit smoking about 20 years ago. His smoking use included Cigarettes. He has a 105 pack-year smoking history. He has never used smokeless tobacco. Mr. Wirthlin reports that he does not drink alcohol.  Review of Systems Complete review of systems negative except as otherwise outlined in the clinical summary and also the following. No palpitations or syncope. Chronic neck pain due to cervical  spine disease.  Physical Examination Filed Vitals:   11/04/14 1300  BP: 148/75  Pulse: 53   Filed Weights   11/04/14 1300  Weight: 195 lb 12.8 oz (88.814 kg)    Appears comfortable at rest.  HEENT: Conjunctiva and lids normal, oropharynx clear with moist mucosa.  Neck: Supple, no elevated JVP or carotid bruits, no thyromegaly.  Lungs: Clear to auscultation, nonlabored breathing at rest.  Cardiac: Regular rate and rhythm, no S3 or significant systolic murmur, no pericardial rub.  Abdomen: Soft, nontender, bowel sounds  present, no guarding or rebound.  Extremities: No pitting edema, distal pulses 1+.   Problem List and Plan   CAD, NATIVE VESSEL Provided refill for nitroglycerin. Continue current regimen and observation as per above discussion. I have asked him to let us know earlier than 6 months if his symptoms worsen so that further evaluation can be arranged.  Essential hypertension, benign Blood pressure elevated today. Continue current dose of beta blocker.Keep follow-up with Dr. Luan Pulling, as additional blood pressure control may be needed. I am requesting most recent lab work for review as well.  Mixed hyperlipidemia He continues on Pravachol. Most recent lab work being requested.    Satira Sark, M.D., F.A.C.C.

## 2014-11-04 NOTE — Patient Instructions (Signed)

## 2014-11-04 NOTE — Assessment & Plan Note (Signed)
Provided refill for nitroglycerin. Continue current regimen and observation as per above discussion. I have asked him to let us know earlier than 6 months if his symptoms worsen so that further evaluation can be arranged.

## 2014-11-04 NOTE — Assessment & Plan Note (Signed)
He continues on Pravachol. Most recent lab work being requested.

## 2014-12-09 ENCOUNTER — Other Ambulatory Visit: Payer: Self-pay | Admitting: Urology

## 2014-12-09 DIAGNOSIS — R109 Unspecified abdominal pain: Secondary | ICD-10-CM

## 2014-12-09 DIAGNOSIS — R1011 Right upper quadrant pain: Secondary | ICD-10-CM

## 2014-12-16 ENCOUNTER — Ambulatory Visit
Admission: RE | Admit: 2014-12-16 | Discharge: 2014-12-16 | Disposition: A | Discharge status: Home / Self Care | Payer: Medicare Other | Source: Ambulatory Visit | Attending: Urology | Admitting: Urology

## 2014-12-16 ENCOUNTER — Ambulatory Visit (AMBULATORY_SURGERY_CENTER): Payer: Self-pay | Admitting: *Deleted

## 2014-12-16 VITALS — Ht 70.5 in | Wt 194.0 lb

## 2014-12-16 DIAGNOSIS — N261 Atrophy of kidney (terminal): Secondary | ICD-10-CM | POA: Diagnosis not present

## 2014-12-16 DIAGNOSIS — Z1211 Encounter for screening for malignant neoplasm of colon: Secondary | ICD-10-CM

## 2014-12-16 DIAGNOSIS — R1011 Right upper quadrant pain: Secondary | ICD-10-CM

## 2014-12-16 DIAGNOSIS — R109 Unspecified abdominal pain: Secondary | ICD-10-CM

## 2014-12-16 MED ORDER — MOVIPREP 100 G PO SOLR: 1.0000 | Freq: Once | ORAL | Status: DC

## 2014-12-16 NOTE — Progress Notes (Signed)
Denies allergies to eggs or soy products. Denies complications with sedation or anesthesia. Denies O2 use. Denies use of diet or weight loss medications.  Emmi instructions not given for colonoscopy. No access to internet.

## 2014-12-17 ENCOUNTER — Telehealth: Payer: Self-pay | Admitting: Internal Medicine

## 2014-12-17 NOTE — Telephone Encounter (Signed)
I called and gave Tameka at [facility] a code for a free Moviprep.  She ran it thru while I was on the phone.  I notified Katharine Look his wife of this information.  She has questions about the cost of sedation.  Per Remo Lipps this information will be passed on to Osvaldo Angst for him to hopefully be able to answer her questions.

## 2014-12-24 ENCOUNTER — Ambulatory Visit (AMBULATORY_SURGERY_CENTER): Payer: Medicare Other | Admitting: Internal Medicine

## 2014-12-24 ENCOUNTER — Encounter: Payer: Self-pay | Admitting: Internal Medicine

## 2014-12-24 VITALS — BP 163/105 | HR 51 | Temp 95.9°F | Resp 21 | Ht 70.5 in | Wt 194.0 lb

## 2014-12-24 DIAGNOSIS — D120 Benign neoplasm of cecum: Secondary | ICD-10-CM

## 2014-12-24 DIAGNOSIS — Z1211 Encounter for screening for malignant neoplasm of colon: Secondary | ICD-10-CM | POA: Diagnosis not present

## 2014-12-24 DIAGNOSIS — D122 Benign neoplasm of ascending colon: Secondary | ICD-10-CM | POA: Diagnosis not present

## 2014-12-24 DIAGNOSIS — D123 Benign neoplasm of transverse colon: Secondary | ICD-10-CM

## 2014-12-24 DIAGNOSIS — K635 Polyp of colon: Secondary | ICD-10-CM | POA: Diagnosis not present

## 2014-12-24 DIAGNOSIS — I10 Essential (primary) hypertension: Secondary | ICD-10-CM | POA: Diagnosis not present

## 2014-12-24 MED ORDER — SODIUM CHLORIDE 0.9 % IV SOLN: 500.0000 mL | INTRAVENOUS | Status: DC

## 2014-12-24 NOTE — Progress Notes (Signed)
Patient and family member given instructions for IV site. Apply warm compresses. Contact office if symptoms of redness at site,ozing.heat applied while in recovery. Swelling  Swelling to a minimal. Able to move hand and fingers without difficulty.

## 2014-12-24 NOTE — Progress Notes (Signed)
Report to PACU, RN, vss, BBS= Clear.  

## 2014-12-24 NOTE — Patient Instructions (Addendum)
I found and removed 5 polyps - they all look benign but you will likely receive a recommendation to have a repeat colonoscopy in about 3 years. You also have a condition called diverticulosis - common and not usually a problem. Please read the handout provided.   I appreciate the opportunity to care for you. Gatha Mayer, MD, [facility]   Discharge instructions given. Handouts on polyps and diverticulosis. Resume previous medications. YOU HAD AN ENDOSCOPIC PROCEDURE TODAY AT [facility]: Refer to the procedure report that was given to you for any specific questions about what was found during the examination.  If the procedure report does not answer your questions, please call your gastroenterologist to clarify.  If you requested that your care partner not be given the details of your procedure findings, then the procedure report has been included in a sealed envelope for you to review at your convenience later.  YOU SHOULD EXPECT: Some feelings of bloating in the abdomen. Passage of more gas than usual.  Walking can help get rid of the air that was put into your GI tract during the procedure and reduce the bloating. If you had a lower endoscopy (such as a colonoscopy or flexible sigmoidoscopy) you may notice spotting of blood in your stool or on the toilet paper. If you underwent a bowel prep for your procedure, then you may not have a normal bowel movement for a few days.  DIET: Your first meal following the procedure should be a light meal and then it is ok to progress to your normal diet.  A half-sandwich or bowl of soup is an example of a good first meal.  Heavy or fried foods are harder to digest and may make you feel nauseous or bloated.  Likewise meals heavy in dairy and vegetables can cause extra gas to form and this can also increase the bloating.  Drink plenty of fluids but you should avoid alcoholic beverages for 24 hours.  ACTIVITY: Your care partner should take  you home directly after the procedure.  You should plan to take it easy, moving slowly for the rest of the day.  You can resume normal activity the day after the procedure however you should NOT DRIVE or use heavy machinery for 24 hours (because of the sedation medicines used during the test).    SYMPTOMS TO REPORT IMMEDIATELY: A gastroenterologist can be reached at any hour.  During normal business hours, 8:30 AM to 5:00 PM Monday through Friday, call [phone].  After hours and on weekends, please call the GI answering service at [phone] who will take a message and have the physician on call contact you.   Following lower endoscopy (colonoscopy or flexible sigmoidoscopy):  Excessive amounts of blood in the stool  Significant tenderness or worsening of abdominal pains  Swelling of the abdomen that is new, acute  Fever of 100F or higher FOLLOW UP: If any biopsies were taken you will be contacted by phone or by letter within the next 1-3 weeks.  Call your gastroenterologist if you have not heard about the biopsies in 3 weeks.  Our staff will call the home number listed on your records the next business day following your procedure to check on you and address any questions or concerns that you may have at that time regarding the information given to you following your procedure. This is a courtesy call and so if there is no answer at the home number and we have  not heard from you through the emergency physician on call, we will assume that you have returned to your regular daily activities without incident.  SIGNATURES/CONFIDENTIALITY: You and/or your care partner have signed paperwork which will be entered into your electronic medical record.  These signatures attest to the fact that that the information above on your After Visit Summary has been reviewed and is understood.  Full responsibility of the confidentiality of this discharge information lies with you and/or your care-partner.

## 2014-12-24 NOTE — Op Note (Signed)
[place]  [address] [place], [address]   COLONOSCOPY PROCEDURE REPORT  PATIENT: Derek [name], Derek [name]  MR#: [id number] BIRTHDATE: 09/26/1946 , 68  yrs. old GENDER: male ENDOSCOPIST: Gatha Mayer, MD, [facility] PROCEDURE DATE:  12/24/2014 PROCEDURE:   Colonoscopy with biopsy and Colonoscopy with snare polypectomy First Screening Colonoscopy - Avg.  risk and is 50 yrs.  old or older Yes.  Prior Negative Screening - Now for repeat screening. N/A  History of Adenoma - Now for follow-up colonoscopy & has been > or = to 3 yrs.  N/A  Polyps Removed Today? Yes. ASA CLASS:   Class III INDICATIONS:first colonoscopy and average risk for colorectal cancer. MEDICATIONS: Propofol 200 mg IV and Monitored anesthesia care  DESCRIPTION OF PROCEDURE:   After the risks benefits and alternatives of the procedure were thoroughly explained, informed consent was obtained.  The digital rectal exam revealed no abnormalities of the rectum, revealed no prostatic nodules, and revealed the prostate was not enlarged.   The LB CF-H180AL Loaner [id number]  endoscope was introduced through the anus and advanced to the cecum, which was identified by both the appendix and ileocecal valve. No adverse events experienced.   The quality of the prep was excellent, using MiraLax  The instrument was then slowly withdrawn as the colon was fully examined.  COLON FINDINGS: Five polyps were found. one diminutive cecal polyp removed with cold forceps. A 1 cm cecal polyp, 1 cm ascending polyp, and 2 8-10 mm transverse polyps were all removed with cold snare and sent to patology also.  There was severe diverticulosis noted in the sigmoid colon.   The examination was otherwise normal. Retroflexed rectal and right colon views revealed no abnormalities. The time to cecum=2 minutes 06 seconds.  Withdrawal time=11 minutes 58 seconds.  The scope was withdrawn and the procedure completed. COMPLICATIONS: There were no  immediate complications.  ENDOSCOPIC IMPRESSION: 1.   Five polyps were found and removed, largest 10 mm 2.   Severe diverticulosis was noted in the sigmoid colon 3.   The examination was otherwise normal    RECOMMENDATIONS: Timing of repeat colonoscopy will be determined by pathology findings.  eSigned:  Gatha Mayer, MD, [facility] 12/24/2014 11:06 AM   cc: Velvet Bathe, MD, Lawerance Bach, MD and The Patient   PATIENT NAME:  [name], [name] MR#: [id number]

## 2014-12-24 NOTE — Progress Notes (Signed)
Called to room to assist during endoscopic procedure.  Patient ID and intended procedure confirmed with present staff. Received instructions for my participation in the procedure from the performing physician.  

## 2014-12-25 ENCOUNTER — Telehealth: Payer: Self-pay | Admitting: *Deleted

## 2014-12-25 NOTE — Telephone Encounter (Signed)
Left a message at [phone] for the pt to call us back if any questions or concerns. maw

## 2015-01-01 ENCOUNTER — Encounter: Payer: Self-pay | Admitting: Internal Medicine

## 2015-01-01 DIAGNOSIS — Z860100 Personal history of colon polyps, unspecified: Secondary | ICD-10-CM

## 2015-01-01 HISTORY — DX: Personal history of colon polyps, unspecified: Z86.0100

## 2015-01-01 HISTORY — DX: Personal history of colonic polyps: Z86.010

## 2015-01-01 NOTE — Progress Notes (Signed)
Quick Note:  Mixture of SSP, adenoma and right-side hyperplastic polyps some 1 cm Repeat colonoscopy 3 years 2019 ______

## 2015-01-07 DIAGNOSIS — N19 Unspecified kidney failure: Secondary | ICD-10-CM | POA: Diagnosis not present

## 2015-01-07 DIAGNOSIS — I2510 Atherosclerotic heart disease of native coronary artery without angina pectoris: Secondary | ICD-10-CM | POA: Diagnosis not present

## 2015-01-07 DIAGNOSIS — I10 Essential (primary) hypertension: Secondary | ICD-10-CM | POA: Diagnosis not present

## 2015-01-07 DIAGNOSIS — R0600 Dyspnea, unspecified: Secondary | ICD-10-CM | POA: Diagnosis not present

## 2015-01-08 ENCOUNTER — Other Ambulatory Visit (HOSPITAL_COMMUNITY): Payer: Self-pay | Admitting: Pulmonary Disease

## 2015-01-08 ENCOUNTER — Ambulatory Visit (HOSPITAL_COMMUNITY)
Admission: RE | Admit: 2015-01-08 | Discharge: 2015-01-08 | Disposition: A | Discharge status: Home / Self Care | Payer: Medicare Other | Source: Ambulatory Visit | Attending: Pulmonary Disease | Admitting: Pulmonary Disease

## 2015-01-08 DIAGNOSIS — M545 Low back pain, unspecified: Secondary | ICD-10-CM

## 2015-01-08 DIAGNOSIS — M8580 Other specified disorders of bone density and structure, unspecified site: Secondary | ICD-10-CM | POA: Insufficient documentation

## 2015-01-08 DIAGNOSIS — M8588 Other specified disorders of bone density and structure, other site: Secondary | ICD-10-CM | POA: Diagnosis not present

## 2015-01-08 DIAGNOSIS — M47896 Other spondylosis, lumbar region: Secondary | ICD-10-CM | POA: Insufficient documentation

## 2015-01-08 DIAGNOSIS — R52 Pain, unspecified: Secondary | ICD-10-CM

## 2015-01-08 DIAGNOSIS — M5137 Other intervertebral disc degeneration, lumbosacral region: Secondary | ICD-10-CM | POA: Diagnosis not present

## 2015-01-08 DIAGNOSIS — M5136 Other intervertebral disc degeneration, lumbar region: Secondary | ICD-10-CM | POA: Diagnosis not present

## 2015-02-13 ENCOUNTER — Other Ambulatory Visit: Payer: Self-pay

## 2015-02-13 MED ORDER — METOPROLOL TARTRATE 25 MG PO TABS: 25.0000 mg | ORAL_TABLET | Freq: Two times a day (BID) | ORAL | Status: DC

## 2015-03-27 ENCOUNTER — Other Ambulatory Visit: Payer: Self-pay | Admitting: Dermatology

## 2015-03-27 DIAGNOSIS — L570 Actinic keratosis: Secondary | ICD-10-CM | POA: Diagnosis not present

## 2015-03-27 DIAGNOSIS — D044 Carcinoma in situ of skin of scalp and neck: Secondary | ICD-10-CM | POA: Diagnosis not present

## 2015-04-07 DIAGNOSIS — N19 Unspecified kidney failure: Secondary | ICD-10-CM | POA: Diagnosis not present

## 2015-04-07 DIAGNOSIS — I10 Essential (primary) hypertension: Secondary | ICD-10-CM | POA: Diagnosis not present

## 2015-04-07 DIAGNOSIS — I2510 Atherosclerotic heart disease of native coronary artery without angina pectoris: Secondary | ICD-10-CM | POA: Diagnosis not present

## 2015-04-07 DIAGNOSIS — M159 Polyosteoarthritis, unspecified: Secondary | ICD-10-CM | POA: Diagnosis not present

## 2015-04-23 ENCOUNTER — Ambulatory Visit (INDEPENDENT_AMBULATORY_CARE_PROVIDER_SITE_OTHER): Payer: Medicare Other | Admitting: Cardiology

## 2015-04-23 ENCOUNTER — Encounter: Payer: Self-pay | Admitting: Cardiology

## 2015-04-23 VITALS — BP 153/79 | HR 59 | Ht 70.0 in | Wt 195.0 lb

## 2015-04-23 DIAGNOSIS — I10 Essential (primary) hypertension: Secondary | ICD-10-CM

## 2015-04-23 DIAGNOSIS — E782 Mixed hyperlipidemia: Secondary | ICD-10-CM

## 2015-04-23 DIAGNOSIS — I2510 Atherosclerotic heart disease of native coronary artery without angina pectoris: Secondary | ICD-10-CM

## 2015-04-23 NOTE — Progress Notes (Signed)
Cardiology Office Note  Date: 04/23/2015   ID: Derek [name], DOB May 09, 1946, MRN [id number]  PCP: Derek Bogus, MD  Primary Cardiologist: Derek Lesches, MD   Chief Complaint  Patient presents with  . Coronary Artery Disease  . Hyperlipidemia    History of Present Illness: Derek [name] is a 69 y.o. male last seen in November 2015. He presents for a routine cardiac visit. He does not report any angina symptoms or increasing shortness of breath on current medical regimen. He tells me that he has had some trouble with hearing loss and balance problems, it sounds like he saw an audiologist and may need hearing aids. He has not seen an ENT physician. Reports chronic ringing in his ears as well and increased wax production.  We discussed his cardiac medications which are unchanged and outlined below. He will be seeing Dr. Luan [name] later in the year for repeat lab work.  Thallium myocardial perfusion study in May 2010 demonstrated small areas of ischemia at the apex and high lateral wall. Cardiac catheterization demonstrated patent stent site in the RCA with 40% mid RCA stenosis, 50% circumflex stenosis, and minor atherosclerosis within the LAD.  Past Medical History  Diagnosis Date  . Mixed hyperlipidemia   . Essential hypertension, benign   . GERD (gastroesophageal reflux disease)   . Arthritis   . Depression   . Anxiety   . Coronary atherosclerosis of native coronary vessel     DES RCA 2007  . Myocardial infarction   . Peripheral vascular disease   . Chronic renal insufficiency     Single kidney  . Chronic idiopathic thrombocytopenia   . Hx of colonic polyps 01/01/2015    Past Surgical History  Procedure Laterality Date  . Hernia repair    . Total hip arthroplasty    . Tonsillectomy    . Cardiac catheterization  2010    Patent stent and nonobsturctive cad following an abnormal cardiolite study may of 2010 with an apical lateral defect  . No past surgeries     1962 BROKE LT HIP WAS PINNED  . Total hip arthroplasty  03/20/2012    Procedure: TOTAL HIP ARTHROPLASTY;  Surgeon: Jessy Oto, MD;  Location: [place];  Service: Orthopedics;  Laterality: Left;  Left total hip replacement, ceramic on poly  . Hardware removal  03/20/2012    Procedure: HARDWARE REMOVAL;  Surgeon: Jessy Oto, MD;  Location: [place];  Service: Orthopedics;  Laterality: Left;  Hardware removal 3 knowles pins left hip  . Joint replacement      left hip  . Carpal tunnel release  11/28/2012    Procedure: CARPAL TUNNEL RELEASE;  Surgeon: Jessy Oto, MD;  Location: [place];  Service: Orthopedics;  Laterality: Left;  Left anterior submuscular transposition of ulnar nerve at elbow, left open carpal tunnel release  . Elbow surgery Left 2013    Current Outpatient Prescriptions  Medication Sig Dispense Refill  . allopurinol (ZYLOPRIM) 300 MG tablet Take 300 mg by mouth daily.      Marland Kitchen aspirin EC 81 MG tablet Take 81 mg by mouth daily.    Marland Kitchen buPROPion (WELLBUTRIN SR) 100 MG 12 hr tablet Take 100 mg by mouth 2 (two) times daily.    . ciprofloxacin (CIPRO) 250 MG tablet Take 1 tablet by mouth daily.    . diazepam (VALIUM) 10 MG tablet Take 10 mg by mouth every 12 (twelve) hours. For spasms    . furosemide (LASIX) 80  MG tablet Take 40 mg by mouth daily.     . metoprolol tartrate (LOPRESSOR) 25 MG tablet Take 1 tablet (25 mg total) by mouth 2 (two) times daily. 60 tablet 3  . nitroGLYCERIN (NITROSTAT) 0.4 MG SL tablet Place 1 tablet (0.4 mg total) under the tongue every 5 (five) minutes x 3 doses as needed. For chest pain. If no relief after 3 rd dose, proceed to the ED for an evaluation. 25 tablet 3  . Omega-3 Fatty Acids (FISH OIL MAXIMUM STRENGTH) 1200 MG CAPS Take 1 capsule by mouth daily.     Marland Kitchen omeprazole (PRILOSEC) 20 MG capsule Take 20 mg by mouth daily.      . pravastatin (PRAVACHOL) 80 MG tablet Take 80 mg by mouth daily.     No current facility-administered medications for this visit.     Allergies:  Motrin and Tylenol   Social History: The patient  reports that he quit smoking about 10 years ago. His smoking use included Cigarettes. He started smoking about 54 years ago. He has a 105 pack-year smoking history. He has never used smokeless tobacco. He reports that he does not drink alcohol or use illicit drugs.   ROS:  Please see the history of present illness. Otherwise, complete review of systems is positive for intermittent falls with balance problems. He does not endorse any dizziness or syncope..  All other systems are reviewed and negative.   Physical Exam: VS:  BP 153/79 mmHg  Pulse 59  Ht 5\' 10"  (1.778 m)  Wt 195 lb (88.451 kg)  BMI 27.98 kg/m2, BMI Body mass index is 27.98 kg/(m^2).  Wt Readings from Last 3 Encounters:  04/23/15 195 lb (88.451 kg)  12/24/14 194 lb (87.998 kg)  12/16/14 194 lb (87.998 kg)     Appears comfortable at rest.  HEENT: Conjunctiva and lids normal, oropharynx clear with moist mucosa.  Neck: Supple, no elevated JVP or carotid bruits, no thyromegaly.  Lungs: Clear to auscultation, nonlabored breathing at rest.  Cardiac: Regular rate and rhythm, no S3 or significant systolic murmur, no pericardial rub.  Abdomen: Soft, nontender, bowel sounds present, no guarding or rebound.  Extremities: No pitting edema, distal pulses 1+.   ECG: Tracing from 11/04/2014 showed sinus bradycardia with old inferior infarct pattern.   Assessment and Plan:  1. CAD status post DES to the RCA in 2007. Last ischemic assessment was 6 years ago as detailed above. He continues to prefer observation on medical therapy rather than pursuing follow-up testing. We have discussed warning signs, and certainly if symptoms progress we can evaluate him further.  2. Hyperlipidemia, on Pravachol. Keep follow with Dr. Luan [name] for repeat lab work later this year.  3. Essential hypertension, he reports compliance with his medications.  Current medicines were  reviewed with the patient today.   Disposition: FU with me in 6 months.   Signed, Derek Sark, MD, [facility] 04/23/2015 1:28 PM    [facility] [place], [place], [place] [address] Phone: [phone]; Fax: [phone]

## 2015-04-23 NOTE — Patient Instructions (Signed)
Your physician recommends that you continue on your current medications as directed. Please refer to the Current Medication list given to you today. Your physician recommends that you schedule a follow-up appointment in: 6 months. You will receive a reminder letter in the mail in about 4 months reminding you to call and schedule your appointment. If you don't receive this letter, please contact our office. 

## 2015-06-24 ENCOUNTER — Other Ambulatory Visit: Payer: Self-pay | Admitting: Cardiology

## 2015-06-24 MED ORDER — METOPROLOL TARTRATE 25 MG PO TABS: 25.0000 mg | ORAL_TABLET | Freq: Two times a day (BID) | ORAL | Status: DC

## 2015-06-24 NOTE — Telephone Encounter (Signed)
metoprolol tartrate (LOPRESSOR) 25 MG tablet PATIENT requested Sunday from [place] told patient they faxed Monday and today to [place]  Please send to [facility] in [place] - patient is out of medication now

## 2015-06-25 ENCOUNTER — Other Ambulatory Visit: Payer: Self-pay

## 2015-06-25 MED ORDER — METOPROLOL TARTRATE 25 MG PO TABS: 25.0000 mg | ORAL_TABLET | Freq: Two times a day (BID) | ORAL | Status: DC

## 2015-06-25 NOTE — Telephone Encounter (Signed)
Refill complete 

## 2015-07-23 DIAGNOSIS — F332 Major depressive disorder, recurrent severe without psychotic features: Secondary | ICD-10-CM | POA: Diagnosis not present

## 2015-09-25 DIAGNOSIS — D1801 Hemangioma of skin and subcutaneous tissue: Secondary | ICD-10-CM | POA: Diagnosis not present

## 2015-09-25 DIAGNOSIS — L570 Actinic keratosis: Secondary | ICD-10-CM | POA: Diagnosis not present

## 2015-09-25 DIAGNOSIS — L821 Other seborrheic keratosis: Secondary | ICD-10-CM | POA: Diagnosis not present

## 2015-10-07 DIAGNOSIS — I10 Essential (primary) hypertension: Secondary | ICD-10-CM | POA: Diagnosis not present

## 2015-10-07 DIAGNOSIS — N19 Unspecified kidney failure: Secondary | ICD-10-CM | POA: Diagnosis not present

## 2015-10-07 DIAGNOSIS — I2510 Atherosclerotic heart disease of native coronary artery without angina pectoris: Secondary | ICD-10-CM | POA: Diagnosis not present

## 2015-10-07 DIAGNOSIS — E785 Hyperlipidemia, unspecified: Secondary | ICD-10-CM | POA: Diagnosis not present

## 2015-10-07 DIAGNOSIS — Z23 Encounter for immunization: Secondary | ICD-10-CM | POA: Diagnosis not present

## 2015-10-28 ENCOUNTER — Encounter: Payer: Self-pay | Admitting: Cardiology

## 2015-10-28 ENCOUNTER — Telehealth: Payer: Self-pay | Admitting: Cardiology

## 2015-10-28 ENCOUNTER — Ambulatory Visit (INDEPENDENT_AMBULATORY_CARE_PROVIDER_SITE_OTHER): Payer: Medicare Other | Admitting: Cardiology

## 2015-10-28 ENCOUNTER — Encounter: Payer: Self-pay | Admitting: *Deleted

## 2015-10-28 VITALS — BP 142/79 | HR 49 | Ht 70.0 in | Wt 200.6 lb

## 2015-10-28 DIAGNOSIS — I2510 Atherosclerotic heart disease of native coronary artery without angina pectoris: Secondary | ICD-10-CM

## 2015-10-28 DIAGNOSIS — E782 Mixed hyperlipidemia: Secondary | ICD-10-CM | POA: Diagnosis not present

## 2015-10-28 DIAGNOSIS — R001 Bradycardia, unspecified: Secondary | ICD-10-CM | POA: Diagnosis not present

## 2015-10-28 MED ORDER — METOPROLOL SUCCINATE ER 25 MG PO TB24: 25.0000 mg | ORAL_TABLET | Freq: Every day | ORAL | Status: DC

## 2015-10-28 NOTE — Progress Notes (Signed)
Cardiology Office Note  Date: 10/28/2015   ID: TILMON Derek [name], DOB [birth year], MRN [id number]  PCP: Alonza Bogus, MD  Primary Cardiologist: [name], MD   Chief Complaint  Patient presents with  . Coronary Artery Disease    History of Present Illness: Derek [name] is a 69 y.o. male last seen in May. He presents for a routine cardiac visit. He does not endorse any specific angina symptoms or nitroglycerin use, states he sometimes "hears" his heart rate if he is still at nighttime. He spent much of today's visit describing concerns he has had about various medications over the years, follow-up with a behavioral health specialist in [place] who is helping him to wean off of Wellbutrin and Valium reportedly, and also concerns about impotence. We did review his cardiac regimen and discussed attempting to reduce his beta blocker dose, particularly in the setting of bradycardia.  He has generally preferred conservative follow-up for his ischemic heart disease, we have held off on stress testing at his request. Today he asked specifically about reevaluating his "left coronary." He related to me that he was concerned that it is been a long time since his heart had been evaluated, and I did remind him that we have discussed this over time as far as arranging a follow-up stress test.  I also pulled up his previous cardiac catheterization reports and we discussed the results in detail. He was under the impression that he had a significant LAD stenosis documented previously, however this was not the case. Cardiac catheterization performed by Dr. Burt [name] in 2010 demonstrated only minimal atherosclerosis within the LAD, moderate sized left circumflex with 50% stenosis, and RCA with patent stent site in the mid vessel, otherwise 40% stenosis. Medical therapy was recommended at that time.  ECG today showed sinus bradycardia with heart rate 53.  He states that he continues to follow with Dr.  Luan [name] for primary care and routine lab work.   Past Medical History  Diagnosis Date  . Mixed hyperlipidemia   . Essential hypertension, benign   . GERD (gastroesophageal reflux disease)   . Arthritis   . Depression   . Anxiety   . Coronary atherosclerosis of native coronary vessel     DES RCA 2007  . Myocardial infarction (Derek [name])   . Peripheral vascular disease ([facility])   . Chronic renal insufficiency     Single kidney  . Chronic idiopathic thrombocytopenia (HCC)   . Hx of colonic polyps 01/01/2015    Past Surgical History  Procedure Laterality Date  . Hernia repair    . Total hip arthroplasty    . Tonsillectomy    . Cardiac catheterization  2010    Patent stent and nonobsturctive cad following an abnormal cardiolite study may of 2010 with an apical lateral defect  . No past surgeries      1962 BROKE LT HIP WAS PINNED  . Total hip arthroplasty  03/20/2012    Procedure: TOTAL HIP ARTHROPLASTY;  Surgeon: Derek Oto, MD;  Location: [facility];  Service: Orthopedics;  Laterality: Left;  Left total hip replacement, ceramic on poly  . Hardware removal  03/20/2012    Procedure: HARDWARE REMOVAL;  Surgeon: Derek Oto, MD;  Location: [facility];  Service: Orthopedics;  Laterality: Left;  Hardware removal 3 knowles pins left hip  . Joint replacement      left hip  . Carpal tunnel release  11/28/2012    Procedure: CARPAL TUNNEL RELEASE;  Surgeon: Derek Bo  Louanne Skye, MD;  Location: [facility];  Service: Orthopedics;  Laterality: Left;  Left anterior submuscular transposition of ulnar nerve at elbow, left open carpal tunnel release  . Elbow surgery Left 2013    Current Outpatient Prescriptions  Medication Sig Dispense Refill  . allopurinol (ZYLOPRIM) 300 MG tablet Take 300 mg by mouth daily.      Marland Kitchen aspirin EC 81 MG tablet Take 81 mg by mouth daily.    Marland Kitchen buPROPion (WELLBUTRIN SR) 100 MG 12 hr tablet Take 100 mg by mouth daily.     . diazepam (VALIUM) 5 MG tablet Take 5 mg by mouth 2 (two) times daily.      . furosemide (LASIX) 80 MG tablet Take 40 mg by mouth daily.     . nitroGLYCERIN (NITROSTAT) 0.4 MG SL tablet Place 1 tablet (0.4 mg total) under the tongue every 5 (five) minutes x 3 doses as needed. For chest pain. If no relief after 3 rd dose, proceed to the ED for an evaluation. 25 tablet 3  . Omega-3 Fatty Acids (FISH OIL MAXIMUM STRENGTH) 1200 MG CAPS Take 1 capsule by mouth daily.     Marland Kitchen omeprazole (PRILOSEC) 20 MG capsule Take 20 mg by mouth daily.      . pravastatin (PRAVACHOL) 80 MG tablet Take 80 mg by mouth daily.    . metoprolol succinate (TOPROL XL) 25 MG 24 hr tablet Take 1 tablet (25 mg total) by mouth daily. 30 tablet 6   No current facility-administered medications for this visit.    Allergies:  Motrin and Tylenol   Social History: The patient  reports that he quit smoking about 11 years ago. His smoking use included Cigarettes. He started smoking about 55 years ago. He has a 105 pack-year smoking history. He has never used smokeless tobacco. He reports that he does not drink alcohol or use illicit drugs.   ROS:  Please see the history of present illness. Otherwise, complete review of systems is positive for decreased hearing.  All other systems are reviewed and negative.   Physical Exam: VS:  BP 142/79 mmHg  Pulse 49  Ht 5\' 10"  (1.778 m)  Wt 200 lb 9.6 oz (90.992 kg)  BMI 28.78 kg/m2  SpO2 97%, BMI Body mass index is 28.78 kg/(m^2).  Wt Readings from Last 3 Encounters:  10/28/15 200 lb 9.6 oz (90.992 kg)  04/23/15 195 lb (88.451 kg)  12/24/14 194 lb (87.998 kg)     Appears comfortable at rest.  HEENT: Conjunctiva and lids normal, oropharynx clear.  Neck: Supple, no elevated JVP or carotid bruits, no thyromegaly.  Lungs: Clear to auscultation, nonlabored breathing at rest.  Cardiac: Regular rate and rhythm, no S3 or significant systolic murmur, no pericardial rub.  Abdomen: Soft, nontender, bowel sounds present, no guarding or rebound.  Extremities: No  pitting edema, distal pulses 1+. Skin: Warm and dry. Musculoskeletal: No kyphosis. Neuropsychiatric: Alert and oriented 3. Somewhat rambling historian, seemed intent on directing and redirecting the conversation today.   ECG: ECG is ordered today.  Other Studies Reviewed Today:  [facility] myocardial perfusion study in May 2010 demonstrated small areas of ischemia at the apex and high lateral wall. Cardiac catheterization demonstrated patent stent site in the RCA with 40% mid RCA stenosis, 50% circumflex stenosis, and minor atherosclerosis within the LAD.  Assessment and Plan:  1. CAD status post DES to the RCA in 2007 with 50% circumflex stenosis documented in 2010. He does not endorse angina specifically at this time.  ECG reviewed. After a long discussion today, we have decided together to reduce beta blocker by changing to Toprol-XL 25 mg once daily, we could even go lower if necessary. This might allow his heart rate to come up somewhat, and perhaps positively affect his described problems with impotence as well. We will go ahead with follow-up ischemic testing since it has been 6 years since previous evaluation, he was in agreement with this today. We will obtain a Lexiscan Cardiolite on medical therapy to assess ischemic burden.  2. Sinus bradycardia. We are reducing effective beta blocker dose.  3. Hyperlipidemia, on Pravachol. Lipids have been followed by Dr. Luan [name]. Last LDL in the chart was 74.  Current medicines were reviewed with the patient today.   Orders Placed This Encounter  Procedures  . NM Myocar Multi W/Spect W/Wall Motion / EF  . Myocardial Perfusion Imaging  . EKG 12-Lead    Disposition: FU with me in 6 months.   Signed, Satira Sark, MD, [facility] 10/28/2015 4:23 PM    [facility] at [place], [place], [place] [address] Phone: [phone]; Fax: [phone]

## 2015-10-28 NOTE — Patient Instructions (Signed)
Your physician has recommended you make the following change in your medication:  Stop metoprolol tartrate (lopressor). Start toprol xl (metoprolol succinate) 25 mg daily. Continue all other medications the same. Your physician has requested that you have a lexiscan myoview. For further information please visit [email or web address]. Please follow instruction sheet, as given. Your physician recommends that you schedule a follow-up appointment in: 6 months. You will receive a reminder letter in the mail in about 4 months reminding you to call and schedule your appointment. If you don't receive this letter, please contact our office.

## 2015-10-28 NOTE — Telephone Encounter (Signed)
Lexiscan Cardiolite on medications dx: CAD  Scheduled for 11/04/2015 at [facility]

## 2015-10-29 ENCOUNTER — Telehealth: Payer: Self-pay | Admitting: Cardiology

## 2015-10-29 NOTE — Telephone Encounter (Signed)
[name] called stating that he went to pharmacy to pick up medication that Dr. Domenic Polite had requested For him to take. States wrong medication was called in.

## 2015-10-29 NOTE — Telephone Encounter (Addendum)
Returned call to patient.  Stated he did call his pharmacy back & they explained to him the difference between the short acting & long acting.  Pharmacy give him the correct medication - Toprol XL 25mg  daily.  Patient verbalized understanding.

## 2015-11-03 NOTE — Telephone Encounter (Signed)
10/29/15 AUTH # [id number] EXP 12/13/15  Derek [name]

## 2015-11-04 ENCOUNTER — Encounter (HOSPITAL_COMMUNITY)
Admission: RE | Admit: 2015-11-04 | Discharge: 2015-11-04 | Disposition: A | Discharge status: Home / Self Care | Payer: Medicare Other | Source: Ambulatory Visit | Attending: Cardiology | Admitting: Cardiology

## 2015-11-04 ENCOUNTER — Encounter (HOSPITAL_COMMUNITY): Payer: Self-pay

## 2015-11-04 ENCOUNTER — Inpatient Hospital Stay (HOSPITAL_COMMUNITY): Admission: RE | Admit: 2015-11-04 | Payer: Medicare Other | Source: Ambulatory Visit

## 2015-11-04 DIAGNOSIS — I2510 Atherosclerotic heart disease of native coronary artery without angina pectoris: Secondary | ICD-10-CM | POA: Insufficient documentation

## 2015-11-04 LAB — NM MYOCAR MULTI W/SPECT W/WALL MOTION / EF
LV dias vol: 88 mL
LV sys vol: 40 mL
Peak HR: 75 {beats}/min
RATE: 0.38
Rest HR: 69 {beats}/min
SDS: 5
SRS: 3
SSS: 8
TID: 1.26

## 2015-11-04 MED ORDER — REGADENOSON 0.4 MG/5ML IV SOLN
INTRAVENOUS | Status: AC
  Administered 2015-11-04: 0.4 mg via INTRAVENOUS
  Filled 2015-11-04: qty 5

## 2015-11-04 MED ORDER — SODIUM CHLORIDE 0.9 % IJ SOLN
INTRAMUSCULAR | Status: AC
  Administered 2015-11-04: 10 mL via INTRAVENOUS
  Filled 2015-11-04: qty 3

## 2015-11-04 MED ORDER — TECHNETIUM TC 99M SESTAMIBI GENERIC - CARDIOLITE
10.0000 | Freq: Once | INTRAVENOUS | Status: AC | PRN
  Administered 2015-11-04: 10 via INTRAVENOUS

## 2015-11-04 MED ORDER — TECHNETIUM TC 99M SESTAMIBI - CARDIOLITE
30.0000 | Freq: Once | INTRAVENOUS | Status: AC | PRN
  Administered 2015-11-04: 11:00:00 30 via INTRAVENOUS

## 2015-11-05 ENCOUNTER — Telehealth: Payer: Self-pay | Admitting: *Deleted

## 2015-11-05 NOTE — Telephone Encounter (Signed)
Patient informed. 

## 2015-11-05 NOTE — Telephone Encounter (Signed)
-----   Message from Satira Sark, MD sent at 11/04/2015 12:58 PM EST ----- Reviewed report. Please let him know that the study shows an area of scar and decreased blood flow within the inferior/inferolateral wall. This could be consistent with a change in his previous RCA stent, or even represent increased blockage within the circumflex artery which was previously only 50%. If he is symptomatically stable on present medical therapy, we can follow him closely for now. If on the other hand he is having any progressive exertional symptoms, we can always discuss proceeding with a cardiac catheterization to further clarify.

## 2015-12-01 DIAGNOSIS — N401 Enlarged prostate with lower urinary tract symptoms: Secondary | ICD-10-CM | POA: Diagnosis not present

## 2015-12-01 DIAGNOSIS — R109 Unspecified abdominal pain: Secondary | ICD-10-CM | POA: Diagnosis not present

## 2015-12-01 DIAGNOSIS — R972 Elevated prostate specific antigen [PSA]: Secondary | ICD-10-CM | POA: Diagnosis not present

## 2015-12-01 DIAGNOSIS — N529 Male erectile dysfunction, unspecified: Secondary | ICD-10-CM | POA: Diagnosis not present

## 2015-12-29 DIAGNOSIS — I10 Essential (primary) hypertension: Secondary | ICD-10-CM | POA: Diagnosis not present

## 2015-12-29 DIAGNOSIS — I129 Hypertensive chronic kidney disease with stage 1 through stage 4 chronic kidney disease, or unspecified chronic kidney disease: Secondary | ICD-10-CM | POA: Diagnosis not present

## 2015-12-29 DIAGNOSIS — M545 Low back pain: Secondary | ICD-10-CM | POA: Diagnosis not present

## 2015-12-29 DIAGNOSIS — N390 Urinary tract infection, site not specified: Secondary | ICD-10-CM | POA: Diagnosis not present

## 2015-12-29 DIAGNOSIS — I2510 Atherosclerotic heart disease of native coronary artery without angina pectoris: Secondary | ICD-10-CM | POA: Diagnosis not present

## 2015-12-31 ENCOUNTER — Encounter: Payer: Self-pay | Admitting: Cardiology

## 2015-12-31 DIAGNOSIS — I129 Hypertensive chronic kidney disease with stage 1 through stage 4 chronic kidney disease, or unspecified chronic kidney disease: Secondary | ICD-10-CM | POA: Diagnosis not present

## 2015-12-31 DIAGNOSIS — I2510 Atherosclerotic heart disease of native coronary artery without angina pectoris: Secondary | ICD-10-CM | POA: Diagnosis not present

## 2015-12-31 DIAGNOSIS — M545 Low back pain: Secondary | ICD-10-CM | POA: Diagnosis not present

## 2015-12-31 DIAGNOSIS — I10 Essential (primary) hypertension: Secondary | ICD-10-CM | POA: Diagnosis not present

## 2016-01-02 ENCOUNTER — Ambulatory Visit (HOSPITAL_COMMUNITY)
Admission: RE | Admit: 2016-01-02 | Discharge: 2016-01-02 | Disposition: A | Discharge status: Home / Self Care | Payer: Medicare Other | Source: Ambulatory Visit | Attending: Pulmonary Disease | Admitting: Pulmonary Disease

## 2016-01-02 ENCOUNTER — Other Ambulatory Visit (HOSPITAL_COMMUNITY): Payer: Self-pay | Admitting: Pulmonary Disease

## 2016-01-02 DIAGNOSIS — M545 Low back pain: Secondary | ICD-10-CM

## 2016-01-02 DIAGNOSIS — M47896 Other spondylosis, lumbar region: Secondary | ICD-10-CM | POA: Insufficient documentation

## 2016-01-02 DIAGNOSIS — M47816 Spondylosis without myelopathy or radiculopathy, lumbar region: Secondary | ICD-10-CM | POA: Diagnosis not present

## 2016-01-06 ENCOUNTER — Other Ambulatory Visit (HOSPITAL_COMMUNITY): Payer: Self-pay | Admitting: Pulmonary Disease

## 2016-01-06 DIAGNOSIS — M545 Low back pain: Secondary | ICD-10-CM | POA: Diagnosis not present

## 2016-01-06 DIAGNOSIS — I10 Essential (primary) hypertension: Secondary | ICD-10-CM | POA: Diagnosis not present

## 2016-01-06 DIAGNOSIS — I129 Hypertensive chronic kidney disease with stage 1 through stage 4 chronic kidney disease, or unspecified chronic kidney disease: Secondary | ICD-10-CM | POA: Diagnosis not present

## 2016-01-06 DIAGNOSIS — D696 Thrombocytopenia, unspecified: Secondary | ICD-10-CM | POA: Diagnosis not present

## 2016-01-06 DIAGNOSIS — N189 Chronic kidney disease, unspecified: Secondary | ICD-10-CM

## 2016-01-08 ENCOUNTER — Other Ambulatory Visit (HOSPITAL_COMMUNITY): Payer: Medicare Other

## 2016-01-09 ENCOUNTER — Ambulatory Visit (HOSPITAL_COMMUNITY)
Admission: RE | Admit: 2016-01-09 | Discharge: 2016-01-09 | Disposition: A | Discharge status: Home / Self Care | Payer: Medicare Other | Source: Ambulatory Visit | Attending: Pulmonary Disease | Admitting: Pulmonary Disease

## 2016-01-09 DIAGNOSIS — N261 Atrophy of kidney (terminal): Secondary | ICD-10-CM | POA: Insufficient documentation

## 2016-01-09 DIAGNOSIS — N189 Chronic kidney disease, unspecified: Secondary | ICD-10-CM

## 2016-01-26 ENCOUNTER — Encounter: Payer: Self-pay | Admitting: Acute Care

## 2016-01-26 ENCOUNTER — Ambulatory Visit (INDEPENDENT_AMBULATORY_CARE_PROVIDER_SITE_OTHER): Payer: Medicare Other | Admitting: Acute Care

## 2016-01-26 ENCOUNTER — Other Ambulatory Visit: Payer: Self-pay | Admitting: Acute Care

## 2016-01-26 ENCOUNTER — Ambulatory Visit (INDEPENDENT_AMBULATORY_CARE_PROVIDER_SITE_OTHER)
Admission: RE | Admit: 2016-01-26 | Discharge: 2016-01-26 | Disposition: A | Discharge status: Home / Self Care | Payer: Medicare Other | Source: Ambulatory Visit | Attending: Acute Care | Admitting: Acute Care

## 2016-01-26 DIAGNOSIS — Z87891 Personal history of nicotine dependence: Secondary | ICD-10-CM

## 2016-01-26 NOTE — Progress Notes (Signed)
Shared Decision Making Visit Lung Cancer Screening Program [phone])   Eligibility:  Age 70 y.o.  Pack Years Smoking History Calculation 136 pack years (# packs/per year x # years smoked)  Recent History of coughing up blood  no  Unexplained weight loss? no ( >Than 15 pounds within the last 6 months )  Prior History Lung / other cancer no (Diagnosis within the last 5 years already requiring surveillance chest CT Scans).  Smoking Status Former Smoker  Former Smokers: Years since quit: 12 years  Quit Date: 07/23/2004  Visit Components:  Discussion included one or more decision making aids. yes  Discussion included risk/benefits of screening. yes  Discussion included potential follow up diagnostic testing for abnormal scans. yes  Discussion included meaning and risk of over diagnosis. yes  Discussion included meaning and risk of False Positives. yes  Discussion included meaning of total radiation exposure. yes  Counseling Included:  Importance of adherence to annual lung cancer LDCT screening. yes  Impact of comorbidities on ability to participate in the program. yes  Ability and willingness to under diagnostic treatment. yes  Smoking Cessation Counseling:  Current Smokers:   Discussed importance of smoking cessation.NA: Former smoker  Information about tobacco cessation classes and interventions provided to patient. NA: Former smoker  Patient provided with "ticket" for LDCT Scan. yes  Symptomatic Patient. no  CounselingNA  Diagnosis Code: Tobacco Use Z72.0  Asymptomatic Patient yes  Counseling NA: former smoker  Former Smokers:   Discussed the importance of maintaining cigarette abstinence. yes  Diagnosis Code: Personal History of Nicotine Dependence. [id number]  Information about tobacco cessation classes and interventions provided to patient. Yes  Patient provided with "ticket" for LDCT Scan. yes  Written Order for Lung Cancer Screening with LDCT  placed in Epic. Yes (CT Chest Lung Cancer Screening Low Dose W/O CM) [id number] Z12.2-Screening of respiratory organs Z87.891-Personal history of nicotine dependence  I spent 15 minutes of face to face time with Derek [name] discussing the risks and benefits of lung cancer screening. We viewed a power point together that explained in detail the above noted topics. We took the time to pause the power point at intervals to allow for questions to be asked and answered to ensure understanding. We discussed that he  had taken the single most powerful action possible to decrease his risk of developing lung cancer when he quit smoking. I counseled him to remain smoke free, and to contact me if he ever had the desire to smoke again so that I can provide resources and tools to help support the effort to remain smoke free. We discussed the time and location of the scan, and that either [facility] or I will call with the results within  24-48 hours of receiving them. He has my card and contact information in the event he needs to speak with me, in addition to a copy of the power point we reviewed as a resource. He verbalized understanding of all of the above and had no further questions upon leaving the office.    Magdalen Spatz, NP

## 2016-02-17 ENCOUNTER — Other Ambulatory Visit: Payer: Self-pay | Admitting: Acute Care

## 2016-02-17 DIAGNOSIS — Z87891 Personal history of nicotine dependence: Secondary | ICD-10-CM

## 2016-03-23 DIAGNOSIS — D692 Other nonthrombocytopenic purpura: Secondary | ICD-10-CM | POA: Diagnosis not present

## 2016-03-23 DIAGNOSIS — D044 Carcinoma in situ of skin of scalp and neck: Secondary | ICD-10-CM | POA: Diagnosis not present

## 2016-03-23 DIAGNOSIS — L821 Other seborrheic keratosis: Secondary | ICD-10-CM | POA: Diagnosis not present

## 2016-03-23 DIAGNOSIS — C4442 Squamous cell carcinoma of skin of scalp and neck: Secondary | ICD-10-CM | POA: Diagnosis not present

## 2016-04-13 DIAGNOSIS — Z0000 Encounter for general adult medical examination without abnormal findings: Secondary | ICD-10-CM | POA: Diagnosis not present

## 2016-04-27 ENCOUNTER — Encounter: Payer: Self-pay | Admitting: Cardiology

## 2016-04-27 ENCOUNTER — Encounter: Payer: Self-pay | Admitting: *Deleted

## 2016-04-27 ENCOUNTER — Ambulatory Visit (INDEPENDENT_AMBULATORY_CARE_PROVIDER_SITE_OTHER): Payer: Medicare Other | Admitting: Cardiology

## 2016-04-27 VITALS — BP 155/75 | HR 58 | Ht 70.0 in | Wt 197.8 lb

## 2016-04-27 DIAGNOSIS — N289 Disorder of kidney and ureter, unspecified: Secondary | ICD-10-CM | POA: Diagnosis not present

## 2016-04-27 DIAGNOSIS — I10 Essential (primary) hypertension: Secondary | ICD-10-CM | POA: Diagnosis not present

## 2016-04-27 DIAGNOSIS — I25119 Atherosclerotic heart disease of native coronary artery with unspecified angina pectoris: Secondary | ICD-10-CM

## 2016-04-27 DIAGNOSIS — E782 Mixed hyperlipidemia: Secondary | ICD-10-CM

## 2016-04-27 MED ORDER — NITROGLYCERIN 0.4 MG SL SUBL: 0.4000 mg | SUBLINGUAL_TABLET | SUBLINGUAL | Status: DC | PRN

## 2016-04-27 NOTE — Patient Instructions (Signed)
Your physician recommends that you continue on your current medications as directed. Please refer to the Current Medication list given to you today. Your physician recommends that you schedule a follow-up appointment in: 6 months. You will receive a reminder letter in the mail in about 4 months reminding you to call and schedule your appointment. If you don't receive this letter, please contact our office. 

## 2016-04-27 NOTE — Progress Notes (Signed)
Cardiology Office Note  Date: 04/27/2016   ID: RAYNOLD MANGAS, DOB 1946-12-02, MRN [id number]  PCP: Alonza Bogus, MD  Primary Cardiologist: [name], MD   Chief Complaint  Patient presents with  . Coronary Artery Disease    History of Present Illness: Derek [name] is a 70 y.o. male last seen in November 2016. He presents for a routine follow-up visit. He does not report any progressing angina symptoms and has stable NYHA class II dyspnea with typical activities.  He did agree to follow-up ischemic testing at the last visit, Cardiolite was abnormal showing an area of scar and peri-infarct ischemia in the inferior/inferolateral wall. Results reviewed with him by phone per nursing, and he has continued medical therapy since that time. I discussed results with him again today. He is still comfortable with observation for now. Recommended that he refill his nitroglycerin to make sure he has a fresh bottle. He thinks his current one could be 70 years old.  Cardiac catheterization performed by Dr. Burt Knack in 2010 demonstrated only minimal atherosclerosis within the LAD, moderate sized left circumflex with 50% stenosis, and RCA with patent stent site in the mid vessel, otherwise 40% stenosis. Medical therapy was recommended at that time.  He reports having had lab work with Dr. Luan Pulling.  Past Medical History  Diagnosis Date  . Mixed hyperlipidemia   . Essential hypertension, benign   . GERD (gastroesophageal reflux disease)   . Arthritis   . Depression   . Anxiety   . Coronary atherosclerosis of native coronary vessel     DES RCA 2007  . Myocardial infarction ([facility])   . Peripheral vascular disease ([facility])   . Chronic renal insufficiency     Single kidney  . Chronic idiopathic thrombocytopenia (HCC)   . Hx of colonic polyps 01/01/2015    Past Surgical History  Procedure Laterality Date  . Hernia repair    . Total hip arthroplasty    . Tonsillectomy    . Cardiac  catheterization  2010    Patent stent and nonobsturctive cad following an abnormal cardiolite study may of 2010 with an apical lateral defect  . No past surgeries      1962 BROKE LT HIP WAS PINNED  . Total hip arthroplasty  03/20/2012    Procedure: TOTAL HIP ARTHROPLASTY;  Surgeon: Jessy Oto, MD;  Location: [facility];  Service: Orthopedics;  Laterality: Left;  Left total hip replacement, ceramic on poly  . Hardware removal  03/20/2012    Procedure: HARDWARE REMOVAL;  Surgeon: Jessy Oto, MD;  Location: [facility];  Service: Orthopedics;  Laterality: Left;  Hardware removal 3 knowles pins left hip  . Joint replacement      left hip  . Carpal tunnel release  11/28/2012    Procedure: CARPAL TUNNEL RELEASE;  Surgeon: Jessy Oto, MD;  Location: [facility];  Service: Orthopedics;  Laterality: Left;  Left anterior submuscular transposition of ulnar nerve at elbow, left open carpal tunnel release  . Elbow surgery Left 2013    Current Outpatient Prescriptions  Medication Sig Dispense Refill  . allopurinol (ZYLOPRIM) 300 MG tablet Take 300 mg by mouth daily.      Marland Kitchen aspirin EC 81 MG tablet Take 81 mg by mouth daily.    Marland Kitchen buPROPion (WELLBUTRIN SR) 100 MG 12 hr tablet Take 100 mg by mouth daily.     . diazepam (VALIUM) 5 MG tablet Take 5 mg by mouth 2 (two) times daily.    Marland Kitchen  furosemide (LASIX) 80 MG tablet Take 40 mg by mouth daily.     . metoprolol succinate (TOPROL XL) 25 MG 24 hr tablet Take 1 tablet (25 mg total) by mouth daily. 30 tablet 6  . nitroGLYCERIN (NITROSTAT) 0.4 MG SL tablet Place 1 tablet (0.4 mg total) under the tongue every 5 (five) minutes x 3 doses as needed. For chest pain. If no relief after 3 rd dose, proceed to the ED for an evaluation. 25 tablet 3  . Omega-3 Fatty Acids (FISH OIL MAXIMUM STRENGTH) 1200 MG CAPS Take 1 capsule by mouth daily.     Marland Kitchen omeprazole (PRILOSEC) 20 MG capsule Take 20 mg by mouth daily.      . pravastatin (PRAVACHOL) 80 MG tablet Take 80 mg by mouth daily.     No  current facility-administered medications for this visit.   Allergies:  Motrin and Tylenol   Social History: The patient  reports that he quit smoking about 11 years ago. His smoking use included Cigarettes. He started smoking about 55 years ago. He has a 126 pack-year smoking history. He has never used smokeless tobacco. He reports that he does not drink alcohol or use illicit drugs.   ROS:  Please see the history of present illness. Otherwise, complete review of systems is positive for fatigue.  All other systems are reviewed and negative.   Physical Exam: VS:  BP 155/75 mmHg  Pulse 58  Ht 5\' 10"  (1.778 m)  Wt 197 lb 12.8 oz (89.721 kg)  BMI 28.38 kg/m2  SpO2 97%, BMI Body mass index is 28.38 kg/(m^2).  Wt Readings from Last 3 Encounters:  04/27/16 197 lb 12.8 oz (89.721 kg)  10/28/15 200 lb 9.6 oz (90.992 kg)  04/23/15 195 lb (88.451 kg)    Appears comfortable at rest.  HEENT: Conjunctiva and lids normal, oropharynx clear.  Neck: Supple, no elevated JVP or carotid bruits, no thyromegaly.  Lungs: Clear to auscultation, nonlabored breathing at rest.  Cardiac: Regular rate and rhythm, no S3 or significant systolic murmur, no pericardial rub.  Abdomen: Soft, nontender, bowel sounds present, no guarding or rebound.  Extremities: No pitting edema, distal pulses 1+. Skin: Warm and dry. Excoriations and blood bug bites on his legs. Musculoskeletal: No kyphosis. Neuropsychiatric: Alert and oriented 3. Somewhat distant.  ECG: I personally reviewed the prior tracing from 10/28/2015 which showed sinus bradycardia.  Other Studies Reviewed Today:  Lexiscan Cardiolite 11/04/2015:  There was no ST segment deviation noted during stress.  Findings consistent with prior inferior/inferolateral myocardial infarction with moderate peri-infarct ischemia.  This is an intermediate risk study.  The left ventricular ejection fraction is normal (55-65%).  Assessment and Plan:  1. CAD  status post DES to the RCA in 2007. Last cardiac catheterization from 2010 as outlined above. He did have a follow-up Cardiolite study last year demonstrating combination of scar and peri-infarct ischemia in the inferior/inferolateral wall. This likely represents progressive CAD, potentially RCA or circumflex distribution. For now he voiced comfort with continued observation on medical therapy, unless symptoms progress. Refill provided for nitroglycerin.  2. Essential hypertension, no changes made present regimen.  3. Hyperlipidemia, on Pravachol. Requesting most recent lab work from Dr. Luan Pulling.  4. Single kidney with renal insufficiency. Requesting most recent lab work from Dr. Luan Pulling.  Current medicines were reviewed with the patient today.  Disposition: FU with me in 6 months.   Signed, Satira Sark, MD, [facility] 04/27/2016 1:41 PM    [facility]  at [place], [place], [place] [address] Phone: [phone]; Fax: [phone]

## 2016-05-20 ENCOUNTER — Other Ambulatory Visit: Payer: Self-pay | Admitting: Cardiology

## 2016-07-27 ENCOUNTER — Ambulatory Visit (HOSPITAL_COMMUNITY)
Admission: RE | Admit: 2016-07-27 | Discharge: 2016-07-27 | Disposition: A | Discharge status: Home / Self Care | Payer: Medicare Other | Source: Ambulatory Visit | Attending: Acute Care | Admitting: Acute Care

## 2016-07-27 DIAGNOSIS — I700 Atherosclerosis of aorta: Secondary | ICD-10-CM | POA: Insufficient documentation

## 2016-07-27 DIAGNOSIS — Z87891 Personal history of nicotine dependence: Secondary | ICD-10-CM | POA: Insufficient documentation

## 2016-07-27 DIAGNOSIS — Z122 Encounter for screening for malignant neoplasm of respiratory organs: Secondary | ICD-10-CM | POA: Insufficient documentation

## 2016-07-27 DIAGNOSIS — I2510 Atherosclerotic heart disease of native coronary artery without angina pectoris: Secondary | ICD-10-CM | POA: Diagnosis not present

## 2016-08-03 ENCOUNTER — Telehealth: Payer: Self-pay | Admitting: Acute Care

## 2016-08-03 DIAGNOSIS — Z87891 Personal history of nicotine dependence: Secondary | ICD-10-CM

## 2016-08-03 NOTE — Telephone Encounter (Signed)
I have called the results of [name] 6 month follow-up CT scan for a initial reading of a lung rads 3. The repeat scan was read as a Lung RADS 2: nodules that are benign in appearance and behavior with a very low likelihood of becoming a clinically active cancer due to size or lack of growth. Recommendation per radiology is for a repeat LDCT in 12 months. I explained to him that the nodules that we are following appeared stable per the radiologist. And their recommendation was for a 12 month follow-up scan. I told him that we would call and schedule the follow-up exam in August 2018. He verbalized understanding, and requested that I send him a copy of the result in the mail. I also explained to him that the scan noted some aortic atherosclerosis and coronary artery calcification. He is already being followed by cardiology. He verbalized understanding of these results, and no further questions upon completion of the call. He does have my contact information in the event he has any follow-up questions.

## 2016-11-24 NOTE — Progress Notes (Signed)
Cardiology Office Note  Date: 11/26/2016   ID: Derek [name], DOB [birth year], MRN [id number]  PCP: Alonza Bogus, MD  Primary Cardiologist: [name], MD   Chief Complaint  Patient presents with  . Coronary Artery Disease    History of Present Illness: Derek [name] is a 70 y.o. male last seen in May.He presents for a routine follow-up visit. On present medical therapy does not endorse any significant angina symptoms and has not used any nitroglycerin. I reviewed his ECG today which shows sinus bradycardia with single PVC.  We have been managing him medically with history of ischemic heart disease and abnormal Myoview updated in November of last year. He had evidence of inferolateral scar with moderate peri-infarct ischemia.  Lipids is been followed by Dr. Luan Pulling. He continues on Pravachol. We are requesting his most recent lab work.  Blood pressure today is normal on medical therapy.  Past Medical History:  Diagnosis Date  . Anxiety   . Arthritis   . Chronic idiopathic thrombocytopenia (HCC)   . Chronic renal insufficiency    Single kidney  . Coronary atherosclerosis of native coronary vessel    DES RCA 2007  . Depression   . Essential hypertension, benign   . GERD (gastroesophageal reflux disease)   . Hx of colonic polyps 01/01/2015  . Mixed hyperlipidemia   . Myocardial infarction   . Peripheral vascular disease [facility])     Past Surgical History:  Procedure Laterality Date  . CARDIAC CATHETERIZATION  2010   Patent stent and nonobsturctive cad following an abnormal cardiolite study may of 2010 with an apical lateral defect  . CARPAL TUNNEL RELEASE  11/28/2012   Procedure: CARPAL TUNNEL RELEASE;  Surgeon: Jessy Oto, MD;  Location: [place];  Service: Orthopedics;  Laterality: Left;  Left anterior submuscular transposition of ulnar nerve at elbow, left open carpal tunnel release  . ELBOW SURGERY Left 2013  . HARDWARE REMOVAL  03/20/2012   Procedure:  HARDWARE REMOVAL;  Surgeon: Jessy Oto, MD;  Location: [place];  Service: Orthopedics;  Laterality: Left;  Hardware removal 3 knowles pins left hip  . HERNIA REPAIR    . JOINT REPLACEMENT     left hip  . NO PAST SURGERIES     1962 BROKE LT HIP WAS PINNED  . TONSILLECTOMY    . TOTAL HIP ARTHROPLASTY    . TOTAL HIP ARTHROPLASTY  03/20/2012   Procedure: TOTAL HIP ARTHROPLASTY;  Surgeon: Jessy Oto, MD;  Location: [place];  Service: Orthopedics;  Laterality: Left;  Left total hip replacement, ceramic on poly    Current Outpatient Prescriptions  Medication Sig Dispense Refill  . allopurinol (ZYLOPRIM) 300 MG tablet Take 300 mg by mouth daily.      Marland Kitchen aspirin EC 81 MG tablet Take 81 mg by mouth daily.    . chlorhexidine (PERIDEX) 0.12 % solution 10 mLs by Mouth Rinse route daily as needed.    . diazepam (VALIUM) 2 MG tablet Take 1 tablet by mouth 2 (two) times daily.    . furosemide (LASIX) 80 MG tablet Take 40 mg by mouth daily.     Marland Kitchen KLOR-CON M20 20 MEQ tablet Take 1 tablet by mouth daily as needed.    . metoprolol succinate (TOPROL-XL) 25 MG 24 hr tablet Take 1 tablet (25 mg total) by mouth daily. 30 tablet 6  . nitroGLYCERIN (NITROSTAT) 0.4 MG SL tablet Place 1 tablet (0.4 mg total) under the tongue every 5 (five)  minutes x 3 doses as needed. For chest pain. If no relief after 3 rd dose, proceed to the ED for an evaluation. 25 tablet 3  . Omega-3 Fatty Acids (FISH OIL MAXIMUM STRENGTH) 1200 MG CAPS Take 2 capsules by mouth daily.     Marland Kitchen omeprazole (PRILOSEC) 20 MG capsule Take 20 mg by mouth daily.      . pravastatin (PRAVACHOL) 80 MG tablet Take 80 mg by mouth daily.     No current facility-administered medications for this visit.    Allergies:  Motrin [ibuprofen] and Tylenol [acetaminophen]   Social History: The patient  reports that he quit smoking about 12 years ago. His smoking use included Cigarettes. He started smoking about 56 years ago. He has a 126.00 pack-year smoking history. He  has never used smokeless tobacco. He reports that he does not drink alcohol or use drugs.   ROS:  Please see the history of present illness. Otherwise, complete review of systems is positive for anxiety.  All other systems are reviewed and negative.   Physical Exam: VS:  BP 101/63   Pulse (!) 59   Ht 5\' 10"  (1.778 m)   Wt 207 lb (93.9 kg)   BMI 29.70 kg/m , BMI Body mass index is 29.7 kg/m.  Wt Readings from Last 3 Encounters:  11/26/16 207 lb (93.9 kg)  04/27/16 197 lb 12.8 oz (89.7 kg)  10/28/15 200 lb 9.6 oz (91 kg)    Appears comfortable at rest.  HEENT: Conjunctiva and lids normal, oropharynx clear.  Neck: Supple, no elevated JVP or carotid bruits, no thyromegaly.  Lungs: Clear to auscultation, nonlabored breathing at rest.  Cardiac: Regular rate and rhythm, no S3 or significant systolic murmur, no pericardial rub.  Abdomen: Soft, nontender, bowel sounds present, no guarding or rebound.  Extremities: No pitting edema, distal pulses 1+. Skin: Warm and dry. Excoriations and blood bug bites on his legs. Musculoskeletal: No kyphosis. Neuropsychiatric: Alert and oriented 3. Somewhat distant.  ECG: I personally reviewed the tracing from 10/28/2015 which showed sinus bradycardia.  Recent Labwork:  Other Studies Reviewed Today:  Lexiscan Cardiolite 11/04/2015:  There was no ST segment deviation noted during stress.  Findings consistent with prior inferior/inferolateral myocardial infarction with moderate peri-infarct ischemia.  This is an intermediate risk study.  The left ventricular ejection fraction is normal (55-65%).  Assessment and Plan:  1. CAD status post DES to the RCA in 2007. Follow-up Myoview from last year showed inferolateral scar with moderate peri-infarct ischemia. He has been symptom medically stable without anginal medical therapy and would continue observation at this point. We have discussed cardiac catheterization if symptoms escalate. He voices  comfort with the current plan.  2. Essential hypertension, blood pressure is well controlled today on current regimen.  3. Hyperlipidemia, on Pravachol. Requesting most recent lab work from Dr. Luan Pulling.  4. Single kidney with history of renal insufficiency.  Current medicines were reviewed with the patient today.   Orders Placed This Encounter  Procedures  . EKG 12-Lead    Disposition: Follow-up in 6 months, sooner if needed.  Signed, Satira Sark, MD, [facility] 11/26/2016 12:04 PM    [facility] [place], [place], [place] [address] Phone: [phone]; Fax: [phone]

## 2016-11-26 ENCOUNTER — Encounter: Payer: Self-pay | Admitting: Cardiology

## 2016-11-26 ENCOUNTER — Ambulatory Visit (INDEPENDENT_AMBULATORY_CARE_PROVIDER_SITE_OTHER): Payer: Medicare Other | Admitting: Cardiology

## 2016-11-26 VITALS — BP 101/63 | HR 59 | Ht 70.0 in | Wt 207.0 lb

## 2016-11-26 DIAGNOSIS — I10 Essential (primary) hypertension: Secondary | ICD-10-CM

## 2016-11-26 DIAGNOSIS — I25119 Atherosclerotic heart disease of native coronary artery with unspecified angina pectoris: Secondary | ICD-10-CM

## 2016-11-26 DIAGNOSIS — N289 Disorder of kidney and ureter, unspecified: Secondary | ICD-10-CM

## 2016-11-26 DIAGNOSIS — E782 Mixed hyperlipidemia: Secondary | ICD-10-CM | POA: Diagnosis not present

## 2016-11-26 MED ORDER — METOPROLOL SUCCINATE ER 25 MG PO TB24: 25.0000 mg | ORAL_TABLET | Freq: Every day | ORAL | 6 refills | Status: DC

## 2016-11-26 NOTE — Patient Instructions (Signed)
Medication Instructions:   Toprol XL filled today.  Continue all other current medications.  Labwork: none  Testing/Procedures: none  Follow-Up: Your physician wants you to follow up in: 6 months.  You will receive a reminder letter in the mail one-two months in advance.  If you don't receive a letter, please call our office to schedule the follow up appointment   Any Other Special Instructions Will Be Listed Below (If Applicable).  If you need a refill on your cardiac medications before your next appointment, please call your pharmacy.

## 2016-11-29 ENCOUNTER — Encounter: Payer: Self-pay | Admitting: *Deleted

## 2016-12-03 DIAGNOSIS — N261 Atrophy of kidney (terminal): Secondary | ICD-10-CM | POA: Insufficient documentation

## 2017-03-23 DIAGNOSIS — L570 Actinic keratosis: Secondary | ICD-10-CM | POA: Diagnosis not present

## 2017-03-23 DIAGNOSIS — C44319 Basal cell carcinoma of skin of other parts of face: Secondary | ICD-10-CM | POA: Diagnosis not present

## 2017-03-23 DIAGNOSIS — L821 Other seborrheic keratosis: Secondary | ICD-10-CM | POA: Diagnosis not present

## 2017-04-25 DIAGNOSIS — Z0000 Encounter for general adult medical examination without abnormal findings: Secondary | ICD-10-CM | POA: Diagnosis not present

## 2017-04-26 ENCOUNTER — Other Ambulatory Visit (HOSPITAL_COMMUNITY): Payer: Self-pay | Admitting: Pulmonary Disease

## 2017-04-26 DIAGNOSIS — I2510 Atherosclerotic heart disease of native coronary artery without angina pectoris: Secondary | ICD-10-CM

## 2017-04-26 DIAGNOSIS — N184 Chronic kidney disease, stage 4 (severe): Secondary | ICD-10-CM | POA: Diagnosis not present

## 2017-04-26 DIAGNOSIS — Z0000 Encounter for general adult medical examination without abnormal findings: Secondary | ICD-10-CM | POA: Diagnosis not present

## 2017-04-26 DIAGNOSIS — Z125 Encounter for screening for malignant neoplasm of prostate: Secondary | ICD-10-CM | POA: Diagnosis not present

## 2017-04-26 DIAGNOSIS — E785 Hyperlipidemia, unspecified: Secondary | ICD-10-CM | POA: Diagnosis not present

## 2017-04-26 DIAGNOSIS — I129 Hypertensive chronic kidney disease with stage 1 through stage 4 chronic kidney disease, or unspecified chronic kidney disease: Secondary | ICD-10-CM | POA: Diagnosis not present

## 2017-04-29 ENCOUNTER — Ambulatory Visit (HOSPITAL_COMMUNITY)
Admission: RE | Admit: 2017-04-29 | Discharge: 2017-04-29 | Disposition: A | Discharge status: Home / Self Care | Payer: Medicare Other | Source: Ambulatory Visit | Attending: Pulmonary Disease | Admitting: Pulmonary Disease

## 2017-04-29 DIAGNOSIS — I6523 Occlusion and stenosis of bilateral carotid arteries: Secondary | ICD-10-CM | POA: Insufficient documentation

## 2017-04-29 DIAGNOSIS — I2510 Atherosclerotic heart disease of native coronary artery without angina pectoris: Secondary | ICD-10-CM | POA: Diagnosis not present

## 2017-05-04 ENCOUNTER — Other Ambulatory Visit: Payer: Self-pay | Admitting: Vascular Surgery

## 2017-05-04 DIAGNOSIS — I6529 Occlusion and stenosis of unspecified carotid artery: Secondary | ICD-10-CM

## 2017-05-13 DIAGNOSIS — Z1211 Encounter for screening for malignant neoplasm of colon: Secondary | ICD-10-CM | POA: Diagnosis not present

## 2017-05-19 ENCOUNTER — Encounter: Payer: Self-pay | Admitting: Vascular Surgery

## 2017-05-26 ENCOUNTER — Ambulatory Visit (HOSPITAL_COMMUNITY)
Admission: RE | Admit: 2017-05-26 | Discharge: 2017-05-26 | Disposition: A | Discharge status: Home / Self Care | Payer: Medicare Other | Source: Ambulatory Visit | Attending: Vascular Surgery | Admitting: Vascular Surgery

## 2017-05-26 ENCOUNTER — Ambulatory Visit: Payer: Medicare Other | Admitting: Cardiology

## 2017-05-26 DIAGNOSIS — I6522 Occlusion and stenosis of left carotid artery: Secondary | ICD-10-CM | POA: Diagnosis not present

## 2017-05-26 DIAGNOSIS — I6529 Occlusion and stenosis of unspecified carotid artery: Secondary | ICD-10-CM | POA: Diagnosis not present

## 2017-05-27 ENCOUNTER — Ambulatory Visit (INDEPENDENT_AMBULATORY_CARE_PROVIDER_SITE_OTHER): Payer: Medicare Other | Admitting: Cardiology

## 2017-05-27 ENCOUNTER — Encounter: Payer: Self-pay | Admitting: Vascular Surgery

## 2017-05-27 ENCOUNTER — Encounter: Payer: Self-pay | Admitting: Cardiology

## 2017-05-27 ENCOUNTER — Ambulatory Visit (INDEPENDENT_AMBULATORY_CARE_PROVIDER_SITE_OTHER): Payer: Medicare Other | Admitting: Vascular Surgery

## 2017-05-27 VITALS — BP 129/69 | HR 57 | Ht 70.0 in | Wt 195.0 lb

## 2017-05-27 VITALS — BP 153/75 | HR 50 | Temp 98.1°F | Resp 16 | Ht 70.0 in | Wt 193.0 lb

## 2017-05-27 DIAGNOSIS — I25119 Atherosclerotic heart disease of native coronary artery with unspecified angina pectoris: Secondary | ICD-10-CM | POA: Diagnosis not present

## 2017-05-27 DIAGNOSIS — I10 Essential (primary) hypertension: Secondary | ICD-10-CM | POA: Diagnosis not present

## 2017-05-27 DIAGNOSIS — I6521 Occlusion and stenosis of right carotid artery: Secondary | ICD-10-CM | POA: Diagnosis not present

## 2017-05-27 DIAGNOSIS — I739 Peripheral vascular disease, unspecified: Secondary | ICD-10-CM

## 2017-05-27 DIAGNOSIS — I779 Disorder of arteries and arterioles, unspecified: Secondary | ICD-10-CM

## 2017-05-27 DIAGNOSIS — E782 Mixed hyperlipidemia: Secondary | ICD-10-CM | POA: Diagnosis not present

## 2017-05-27 DIAGNOSIS — I493 Ventricular premature depolarization: Secondary | ICD-10-CM

## 2017-05-27 NOTE — Progress Notes (Signed)
New Carotid Patient  Requested by:  [name], MD [address] [place], [place] [address]  Reason for consultation: right ICA occlusion, LICA stenosis >65%   History of Present Illness   Derek [name] is a 71 y.o. (1946/04/02) male who presents with chief complaint: "hear my heart beat in my head".   Pt notes since December 2017, he had been able to hear his heart beat in the back of his head.  On recent follow-up with his PCP, he again mentioned this along with a home health nurse visit with an abnormal R neck "sound" lead to recent B carotid duplex.   Previous carotid studies (04/29/17( demonstrated: RICA completely occluded, LICA >99% stenosis.  Patient has no history of TIA or stroke symptom.  The patient has never had amaurosis fugax or monocular blindness.  The patient has never had facial drooping or hemiplegia.  The patient has never had receptive or expressive aphasia.     The patient's risks factors for carotid disease include: HLD, HTN, prior smoking history.  Past Medical History:  Diagnosis Date  . Anxiety   . Arthritis   . Chronic idiopathic thrombocytopenia (HCC)   . Chronic renal insufficiency    Single kidney  . Coronary atherosclerosis of native coronary vessel    DES RCA 2007  . Depression   . Essential hypertension, benign   . GERD (gastroesophageal reflux disease)   . Hx of colonic polyps 01/01/2015  . Mixed hyperlipidemia   . Myocardial infarction ([place])   . Peripheral vascular disease [facility])     Past Surgical History:  Procedure Laterality Date  . CARDIAC CATHETERIZATION  2010   Patent stent and nonobsturctive cad following an abnormal cardiolite study may of 2010 with an apical lateral defect  . CARPAL TUNNEL RELEASE  11/28/2012   Procedure: CARPAL TUNNEL RELEASE;  Surgeon: Jessy Oto, MD;  Location: [place];  Service: Orthopedics;  Laterality: Left;  Left anterior submuscular transposition of ulnar nerve at elbow, left open  carpal tunnel release  . ELBOW SURGERY Left 2013  . HARDWARE REMOVAL  03/20/2012   Procedure: HARDWARE REMOVAL;  Surgeon: Jessy Oto, MD;  Location: [place];  Service: Orthopedics;  Laterality: Left;  Hardware removal 3 knowles pins left hip  . HERNIA REPAIR    . JOINT REPLACEMENT     left hip  . NO PAST SURGERIES     1962 BROKE LT HIP WAS PINNED  . TONSILLECTOMY    . TOTAL HIP ARTHROPLASTY    . TOTAL HIP ARTHROPLASTY  03/20/2012   Procedure: TOTAL HIP ARTHROPLASTY;  Surgeon: Jessy Oto, MD;  Location: [place];  Service: Orthopedics;  Laterality: Left;  Left total hip replacement, ceramic on poly    Social History   Social History  . Marital status: Married    Spouse name: N/A  . Number of children: N/A  . Years of education: N/A   Occupational History  . Not on file.   Social History Main Topics  . Smoking status: Former Smoker    Packs/day: 3.00    Years: 42.00    Types: Cigarettes    Start date: 10/30/1960    Quit date: 07/23/2004  . Smokeless tobacco: Never Used     Comment: Counseled to remain smoke free  . Alcohol use No  . Drug use: No  . Sexual activity: Not on file   Other Topics Concern  . Not on file   Social History  Narrative   Works full time.     Family History  Problem Relation Age of Onset  . Colon cancer Brother   . Stroke Other   . Coronary artery disease Other   . Esophageal cancer Neg Hx   . Stomach cancer Neg Hx   . Rectal cancer Neg Hx     Current Outpatient Prescriptions  Medication Sig Dispense Refill  . allopurinol (ZYLOPRIM) 300 MG tablet Take 300 mg by mouth daily.      Marland Kitchen aspirin EC 81 MG tablet Take 81 mg by mouth daily.    . chlorhexidine (PERIDEX) 0.12 % solution 10 mLs by Mouth Rinse route daily as needed.    . diazepam (VALIUM) 2 MG tablet Take 1 tablet by mouth 2 (two) times daily.    . furosemide (LASIX) 80 MG tablet Take 40 mg by mouth daily.     Marland Kitchen KLOR-CON M20 20 MEQ tablet Take 1 tablet by mouth daily as needed.    .  metoprolol succinate (TOPROL-XL) 25 MG 24 hr tablet Take 1 tablet (25 mg total) by mouth daily. 30 tablet 6  . nitroGLYCERIN (NITROSTAT) 0.4 MG SL tablet Place 1 tablet (0.4 mg total) under the tongue every 5 (five) minutes x 3 doses as needed. For chest pain. If no relief after 3 rd dose, proceed to the ED for an evaluation. 25 tablet 3  . Omega-3 Fatty Acids (FISH OIL MAXIMUM STRENGTH) 1200 MG CAPS Take 2 capsules by mouth daily.     Marland Kitchen omeprazole (PRILOSEC) 20 MG capsule Take 20 mg by mouth daily.      . pravastatin (PRAVACHOL) 80 MG tablet Take 80 mg by mouth daily.     No current facility-administered medications for this visit.     Allergies  Allergen Reactions  . Motrin [Ibuprofen] Other (See Comments)    One kidney  . Tylenol [Acetaminophen] Other (See Comments)    One kidney    REVIEW OF SYSTEMS (negative unless checked):   Cardiac:  []  Chest pain or chest pressure? []  Shortness of breath upon activity? []  Shortness of breath when lying flat? []  Irregular heart rhythm?  Vascular:  []  Pain in calf, thigh, or hip brought on by walking? []  Pain in feet at night that wakes you up from your sleep? []  Blood clot in your veins? []  Leg swelling?  Pulmonary:  []  Oxygen at home? []  Productive cough? []  Wheezing?  Neurologic:  []  Sudden weakness in arms or legs? []  Sudden numbness in arms or legs? []  Sudden onset of difficult speaking or slurred speech? []  Temporary loss of vision in one eye? []  Problems with dizziness?  Gastrointestinal:  []  Blood in stool? []  Vomited blood?  Genitourinary:  []  Burning when urinating? []  Blood in urine?  Psychiatric:  []  Major depression  Hematologic:  []  Bleeding problems? []  Problems with blood clotting?  Dermatologic:  []  Rashes or ulcers?  Constitutional:  []  Fever or chills?  Ear/Nose/Throat:  []  Change in hearing? []  Nose bleeds? []  Sore throat?  Musculoskeletal:  []  Back pain? []  Joint pain? []  Muscle  pain?   For VQI Use Only   PRE-ADM LIVING Home  AMB STATUS Ambulatory  CAD Sx History of MI, but no symptoms No MI within 6 months  PRIOR CHF None  STRESS TEST No    Physical Examination     Vitals:   05/27/17 0952 05/27/17 1002  BP: (!) 161/77 (!) 153/75  Pulse: (!) 50   Resp: 16  Temp: 98.1 F (36.7 C)   TempSrc: Oral   SpO2: 98%   Weight: 193 lb (87.5 kg)   Height: 5\' 10"  (1.778 m)    Body mass index is 27.69 kg/m.  General Alert, O x 3, WD, NAD  Head [place]/AT,    Ear/Nose/ Throat Hearing grossly intact, nares without erythema or drainage, oropharynx without Erythema or Exudate, Mallampati score: 3,   Eyes PERRLA, EOMI,    Neck Supple, mid-line trachea,    Pulmonary Sym exp, good B air movt, CTA B  Cardiac RRR, Nl S1, S2, no Murmurs, No rubs, No S3,S4  Vascular Vessel Right Left  Radial Palpable Palpable  Brachial Palpable Palpable  Carotid Palpable, No Bruit Palpable, No Bruit  Aorta Not palpable N/A  Femoral Palpable Palpable  Popliteal Not palpable Not palpable  PT Not palpable Not palpable  DP Not palpable Not palpable    Gastro- intestinal soft, non-distended, non-tender to palpation, No guarding or rebound, no HSM, no masses, no CVAT B, No palpable prominent aortic pulse,    Musculo- skeletal M/S 5/5 throughout  , Extremities without ischemic changes  , No edema present, No visible varicosities , No Lipodermatosclerosis present  Neurologic Cranial nerves 2-12 intact , Pain and light touch intact in extremities , Motor exam as listed above  Psychiatric Judgement intact, Mood & affect appropriate for pt's clinical situation  Dermatologic See M/S exam for extremity exam, No rashes otherwise noted  Lymphatic  Palpable lymph nodes: None     Non-invasive Vascular Imaging   Outside B Carotid duplex (04/29/17)  Extensive bilateral carotid atherosclerosis.  Chronic complete right ICA occlusion  Left ICA stenosis estimated at greater than 70% by  ultrasound criteria (301/75  cm/sec)  Right vertebral artery not visualized, possibly occluded  Left vertebral artery remains patent with normal antegrade flow.   L Carotid Duplex (05/27/2017):  L ICA stenosis:  60-79% (251/72 c/s) L VA: patent and antegrade   Outside Studies/Documentation   10 pages of outside documents were reviewed including: outside PCP chart.   Medical Decision Making   Derek [name] is a 71 y.o. male who presents with: asx R ICA occlusion, asx L ICA stenosis 60-79%, chronic kidney disease stage 2-3    Obviously the patient has adequate collateral flow despite his R ICA occlusion.  There is no benefit to revascularization of the R ICA occlusion, as there is an increased incidence of hemorrhagic stroke after doing such.  The repeat L carotid duplex does not reproduce the prior degree of stenosis suggested by outside duplex.  Given pt's CKD, I don't recommend a CTA Neck at this point.  If his stenosis increases, a limited L carotid angiogram might be needed at some point.  Based on the patient's vascular studies and examination, I have offered the patient: q6 month B carotid duplex. I discussed in depth with the patient the nature of atherosclerosis, and emphasized the importance of maximal medical management including strict control of blood pressure, blood glucose, and lipid levels, obtaining regular exercise, antiplatelet agents, and cessation of smoking.   The patient is currently on a statin: Pravachol.  The patient is currently on an anti-platelet: ASA.  The patient is aware that without maximal medical management the underlying atherosclerotic disease process will progress, limiting the benefit of any interventions.  Thank you for allowing [place] to participate in this patient's care.   Adele Barthel, MD, FACS [facility] Office: [phone] Pager: [phone]  05/27/2017, 9:52 AM

## 2017-05-27 NOTE — Progress Notes (Signed)
Cardiology Office Note  Date: 05/27/2017   ID: Derek [name], DOB 1946-06-02, MRN [id number]  PCP: [name], MD  Primary Cardiologist: [name], MD   Chief Complaint  Patient presents with  . Coronary Artery Disease    History of Present Illness: Derek [name] is a 71 y.o. male last seen in December 2017. He presents for a routine follow-up visit. He tells me that he has not had any angina symptoms or used nitroglycerin. Interval workup includes evaluation of carotid artery disease. He has been seen recently by Dr. [name], I reviewed the notes. He has an occluded RICA and 65-68% LICA stenosis based on most recent carotid Dopplers. He is asymptomatic in terms of neurological symptoms, does tend to feel/hear his heartbeat when he is still. Based on Dr. [name] note, plan is not to pursue angiography in light of the patient's chronic kidney disease, follow-up carotid Dopplers are anticipated in 6 months. He is on aspirin and statin therapy.   We have been managing him medically with history of ischemic heart disease and abnormal Myoview updated in November of 2016. He had evidence of inferolateral scar with moderate peri-infarct ischemia. We discussed this issue again today, he remains comfortable with observation, and is reluctant to pursue cardiac catheterization in light of CKD with single kidney. Overall reasonable not to push for cardiac catheterization in the absence of progressive angina symptoms on medical therapy.  I personally reviewed his ECG today which shows sinus bradycardia. He does report feeling occasional "skips" and seems to focus on counting his heart beat sometimes at nighttime when he is still. He has previously documented PVCs which he is most likely feeling, we discussed this today. He is already on beta blocker and has very good heart rate control in the 50s to 60s. He has had no syncope.  I reviewed his medications which are outlined below.  Past Medical  History:  Diagnosis Date  . Anxiety   . Arthritis   . Chronic idiopathic thrombocytopenia (HCC)   . Chronic renal insufficiency    Single kidney  . Coronary atherosclerosis of native coronary vessel    DES RCA 2007  . Depression   . Essential hypertension, benign   . GERD (gastroesophageal reflux disease)   . Hx of colonic polyps 01/01/2015  . Mixed hyperlipidemia   . Myocardial infarction ([place])   . Peripheral vascular disease [facility])     Past Surgical History:  Procedure Laterality Date  . CARDIAC CATHETERIZATION  2010   Patent stent and nonobsturctive cad following an abnormal cardiolite study may of 2010 with an apical lateral defect  . CARPAL TUNNEL RELEASE  11/28/2012   Procedure: CARPAL TUNNEL RELEASE;  Surgeon: Jessy Oto, MD;  Location: [place];  Service: Orthopedics;  Laterality: Left;  Left anterior submuscular transposition of ulnar nerve at elbow, left open carpal tunnel release  . ELBOW SURGERY Left 2013  . HARDWARE REMOVAL  03/20/2012   Procedure: HARDWARE REMOVAL;  Surgeon: Jessy Oto, MD;  Location: [place];  Service: Orthopedics;  Laterality: Left;  Hardware removal 3 knowles pins left hip  . HERNIA REPAIR    . JOINT REPLACEMENT     left hip  . NO PAST SURGERIES     1962 BROKE LT HIP WAS PINNED  . TONSILLECTOMY    . TOTAL HIP ARTHROPLASTY    . TOTAL HIP ARTHROPLASTY  03/20/2012   Procedure: TOTAL HIP ARTHROPLASTY;  Surgeon: Jessy Oto, MD;  Location: [facility]  OR;  Service: Orthopedics;  Laterality: Left;  Left total hip replacement, ceramic on poly    Current Outpatient Prescriptions  Medication Sig Dispense Refill  . allopurinol (ZYLOPRIM) 300 MG tablet Take 300 mg by mouth daily.      Marland Kitchen aspirin EC 81 MG tablet Take 81 mg by mouth daily.    Marland Kitchen buPROPion (WELLBUTRIN SR) 100 MG 12 hr tablet Take 50 mg by mouth daily.    . chlorhexidine (PERIDEX) 0.12 % solution 10 mLs by Mouth Rinse route daily as needed.    . diazepam (VALIUM) 2 MG tablet Take 1 mg by mouth 2 (two)  times daily.     . furosemide (LASIX) 80 MG tablet Take 40 mg by mouth daily.     . metoprolol succinate (TOPROL-XL) 25 MG 24 hr tablet Take 1 tablet (25 mg total) by mouth daily. 30 tablet 6  . nitroGLYCERIN (NITROSTAT) 0.4 MG SL tablet Place 1 tablet (0.4 mg total) under the tongue every 5 (five) minutes x 3 doses as needed. For chest pain. If no relief after 3 rd dose, proceed to the ED for an evaluation. 25 tablet 3  . Omega-3 Fatty Acids (FISH OIL MAXIMUM STRENGTH) 1200 MG CAPS Take 2 capsules by mouth daily.     Marland Kitchen omeprazole (PRILOSEC) 20 MG capsule Take 20 mg by mouth daily.      . pravastatin (PRAVACHOL) 80 MG tablet Take 80 mg by mouth daily.     No current facility-administered medications for this visit.    Allergies:  Motrin [ibuprofen] and Tylenol [acetaminophen]   Social History: The patient  reports that he quit smoking about 12 years ago. His smoking use included Cigarettes. He started smoking about 56 years ago. He has a 126.00 pack-year smoking history. He has never used smokeless tobacco. He reports that he does not drink alcohol or use drugs.   ROS:  Please see the history of present illness. Otherwise, complete review of systems is positive for Occasional heart "skips.".  All other systems are reviewed and negative.   Physical Exam: VS:  BP 129/69   Pulse (!) 57   Ht 5\' 10"  (1.778 m)   Wt 195 lb (88.5 kg)   SpO2 95%   BMI 27.98 kg/m , BMI Body mass index is 27.98 kg/m.  Wt Readings from Last 3 Encounters:  05/27/17 195 lb (88.5 kg)  05/27/17 193 lb (87.5 kg)  11/26/16 207 lb (93.9 kg)    Overweight male, appears comfortable at rest.  HEENT: Conjunctiva and lids normal, oropharynx clear.  Neck: Supple, no elevated JVP, bilateral carotid bruits, no thyromegaly.  Lungs: Clear to auscultation, nonlabored breathing at rest.  Cardiac: Regular rate and rhythm with occasional ectopic beat, no S3 or significant systolic murmur, no pericardial rub.  Abdomen: Soft,  nontender, bowel sounds present, no guarding or rebound.  Extremities: No pitting edema, distal pulses 1+. Skin: Warm and dry. Musculoskeletal: No kyphosis. Neuropsychiatric: Alert and oriented 3. Affect grossly appropriate today.  ECG: I personally reviewed the tracing from 11/26/2016 which showed sinus bradycardia with PVC.  Recent Labwork:  January 2017: Hemoglobin 14.4, platelets 113, potassium 3.4, BUN 17, creatinine 1.79, TSH 1.30  Other Studies Reviewed Today:  Lexiscan Cardiolite 11/04/2015:  There was no ST segment deviation noted during stress.  Findings consistent with prior inferior/inferolateral myocardial infarction with moderate peri-infarct ischemia.  This is an intermediate risk study.  The left ventricular ejection fraction is normal (55-65%).  Assessment and Plan:  1. CAD  status post DES to the RCA in 2007. He does have evidence of progressive disease based on abnormal Cardiolite in November 2016 showing inferior/inferolateral scar with moderate peri-infarct ischemia. Fortunately, he does not endorse progressive angina on medical therapy and is comfortable with observation. He does have CKD with single kidney, creatinine 1.8 range and would be at risk for contrast nephropathy. We will hold off cardiac catheterization unless he has accelerating symptoms.  2. Symptomatically PVCs, describes occasional heart "skips" but no sudden dizziness or syncope. He is on Toprol-XL with good heart rate control at baseline. ECG reviewed today. LVEF has been normal range.  3. Bilateral carotid artery disease, occluded RICA and 82-57% LICA. He is following with Dr. [name] at this point and is on aspirin and statin therapy.  4. Hyperlipidemia, continues on Pravachol and follows with Dr. Luan Pulling.  5. Essential hypertension, blood pressure is adequately controlled today.  Current medicines were reviewed with the patient today.   Orders Placed This Encounter  Procedures  . EKG  12-Lead    Disposition: Follow-up in 6 months, sooner if needed.  Signed, Satira Sark, MD, [facility] 05/27/2017 3:27 PM    [facility] [place], [place], [place] [address] Phone: [phone]; Fax: [phone]

## 2017-05-27 NOTE — Patient Instructions (Signed)

## 2017-06-01 ENCOUNTER — Encounter: Payer: Self-pay | Admitting: Pulmonary Disease

## 2017-06-03 NOTE — Addendum Note (Signed)
Addended by: Lianne Cure A on: 06/03/2017 02:55 PM   Modules accepted: Orders

## 2017-07-04 DIAGNOSIS — H2513 Age-related nuclear cataract, bilateral: Secondary | ICD-10-CM | POA: Diagnosis not present

## 2017-07-20 ENCOUNTER — Other Ambulatory Visit: Payer: Self-pay | Admitting: Cardiology

## 2017-07-28 ENCOUNTER — Ambulatory Visit (HOSPITAL_COMMUNITY)
Admission: RE | Admit: 2017-07-28 | Discharge: 2017-07-28 | Disposition: A | Discharge status: Home / Self Care | Payer: Medicare Other | Source: Ambulatory Visit | Attending: Acute Care | Admitting: Acute Care

## 2017-07-28 DIAGNOSIS — J439 Emphysema, unspecified: Secondary | ICD-10-CM | POA: Diagnosis not present

## 2017-07-28 DIAGNOSIS — I2510 Atherosclerotic heart disease of native coronary artery without angina pectoris: Secondary | ICD-10-CM | POA: Diagnosis not present

## 2017-07-28 DIAGNOSIS — Z87891 Personal history of nicotine dependence: Secondary | ICD-10-CM | POA: Diagnosis not present

## 2017-07-28 DIAGNOSIS — N62 Hypertrophy of breast: Secondary | ICD-10-CM | POA: Insufficient documentation

## 2017-07-28 DIAGNOSIS — Z122 Encounter for screening for malignant neoplasm of respiratory organs: Secondary | ICD-10-CM | POA: Diagnosis not present

## 2017-07-28 DIAGNOSIS — I700 Atherosclerosis of aorta: Secondary | ICD-10-CM | POA: Diagnosis not present

## 2017-07-28 DIAGNOSIS — R911 Solitary pulmonary nodule: Secondary | ICD-10-CM | POA: Diagnosis not present

## 2017-07-29 ENCOUNTER — Other Ambulatory Visit: Payer: Self-pay | Admitting: Acute Care

## 2017-07-29 DIAGNOSIS — Z87891 Personal history of nicotine dependence: Secondary | ICD-10-CM

## 2017-08-17 DIAGNOSIS — H353131 Nonexudative age-related macular degeneration, bilateral, early dry stage: Secondary | ICD-10-CM | POA: Diagnosis not present

## 2017-08-17 DIAGNOSIS — H25013 Cortical age-related cataract, bilateral: Secondary | ICD-10-CM | POA: Diagnosis not present

## 2017-08-17 DIAGNOSIS — H2512 Age-related nuclear cataract, left eye: Secondary | ICD-10-CM | POA: Diagnosis not present

## 2017-08-17 DIAGNOSIS — H2513 Age-related nuclear cataract, bilateral: Secondary | ICD-10-CM | POA: Diagnosis not present

## 2017-08-17 DIAGNOSIS — H524 Presbyopia: Secondary | ICD-10-CM | POA: Diagnosis not present

## 2017-08-23 DIAGNOSIS — H2512 Age-related nuclear cataract, left eye: Secondary | ICD-10-CM | POA: Diagnosis not present

## 2017-08-23 DIAGNOSIS — H25812 Combined forms of age-related cataract, left eye: Secondary | ICD-10-CM | POA: Diagnosis not present

## 2017-08-24 DIAGNOSIS — H2512 Age-related nuclear cataract, left eye: Secondary | ICD-10-CM | POA: Diagnosis not present

## 2017-09-12 DIAGNOSIS — H25011 Cortical age-related cataract, right eye: Secondary | ICD-10-CM | POA: Diagnosis not present

## 2017-09-12 DIAGNOSIS — H2511 Age-related nuclear cataract, right eye: Secondary | ICD-10-CM | POA: Diagnosis not present

## 2017-09-20 DIAGNOSIS — H2511 Age-related nuclear cataract, right eye: Secondary | ICD-10-CM | POA: Diagnosis not present

## 2017-09-20 DIAGNOSIS — H268 Other specified cataract: Secondary | ICD-10-CM | POA: Diagnosis not present

## 2017-10-17 DIAGNOSIS — L821 Other seborrheic keratosis: Secondary | ICD-10-CM | POA: Diagnosis not present

## 2017-10-17 DIAGNOSIS — D1801 Hemangioma of skin and subcutaneous tissue: Secondary | ICD-10-CM | POA: Diagnosis not present

## 2017-10-17 DIAGNOSIS — Z85828 Personal history of other malignant neoplasm of skin: Secondary | ICD-10-CM | POA: Diagnosis not present

## 2017-10-17 DIAGNOSIS — L570 Actinic keratosis: Secondary | ICD-10-CM | POA: Diagnosis not present

## 2017-11-23 ENCOUNTER — Encounter: Payer: Self-pay | Admitting: Cardiology

## 2017-11-23 ENCOUNTER — Ambulatory Visit: Payer: Medicare Other | Admitting: Cardiology

## 2017-11-23 VITALS — BP 186/76 | HR 59 | Ht 70.0 in | Wt 198.6 lb

## 2017-11-23 DIAGNOSIS — I25119 Atherosclerotic heart disease of native coronary artery with unspecified angina pectoris: Secondary | ICD-10-CM | POA: Diagnosis not present

## 2017-11-23 DIAGNOSIS — E782 Mixed hyperlipidemia: Secondary | ICD-10-CM

## 2017-11-23 DIAGNOSIS — I6523 Occlusion and stenosis of bilateral carotid arteries: Secondary | ICD-10-CM

## 2017-11-23 DIAGNOSIS — N289 Disorder of kidney and ureter, unspecified: Secondary | ICD-10-CM

## 2017-11-23 NOTE — Progress Notes (Signed)
Cardiology Office Note  Date: 11/23/2017   ID: Derek [name], Derek [name] 1946-11-06, MRN [id number]  PCP: [name], MD  Primary Cardiologist: [name], MD   Chief Complaint  Patient presents with  . Coronary Artery Disease    History of Present Illness: Derek [name] is a 71 y.o. male last seen in June.  He presents for a routine follow-up visit.  Denies having any recurring chest pain until a few days ago when he began to experience a sharp, recurring right lower costal discomfort.  States that he woke up in the morning with this.  It lasted for a few seconds at a time and recurred several times before finally resolving.  Yesterday he was active outside without recurrent chest pain.  He reports no increasing shortness of breath, palpitations, or syncope.  I personally reviewed his follow-up ECG today in the office which showed sinus bradycardia, no significant changes compared to the previous tracing.  We went over his cardiac regimen.  He reports compliance.  He is on aspirin, Toprol-XL, Pravachol, Lasix, potassium supplements, and as needed nitroglycerin.  Last ischemic testing was in 2016, we have continued with medical therapy.  Past Medical History:  Diagnosis Date  . Anxiety   . Arthritis   . Chronic idiopathic thrombocytopenia (HCC)   . Chronic renal insufficiency    Single kidney  . Coronary atherosclerosis of native coronary vessel    DES RCA 2007  . Depression   . Essential hypertension, benign   . GERD (gastroesophageal reflux disease)   . Hx of colonic polyps 01/01/2015  . Mixed hyperlipidemia   . Myocardial infarction ([place])   . Peripheral vascular disease [facility])     Past Surgical History:  Procedure Laterality Date  . CARDIAC CATHETERIZATION  2010   Patent stent and nonobsturctive cad following an abnormal cardiolite study may of 2010 with an apical lateral defect  . CARPAL TUNNEL RELEASE  11/28/2012   Procedure: CARPAL TUNNEL RELEASE;  Surgeon:  Jessy Oto, MD;  Location: [place];  Service: Orthopedics;  Laterality: Left;  Left anterior submuscular transposition of ulnar nerve at elbow, left open carpal tunnel release  . ELBOW SURGERY Left 2013  . HARDWARE REMOVAL  03/20/2012   Procedure: HARDWARE REMOVAL;  Surgeon: Jessy Oto, MD;  Location: [place];  Service: Orthopedics;  Laterality: Left;  Hardware removal 3 knowles pins left hip  . HERNIA REPAIR    . JOINT REPLACEMENT     left hip  . NO PAST SURGERIES     1962 BROKE LT HIP WAS PINNED  . TONSILLECTOMY    . TOTAL HIP ARTHROPLASTY    . TOTAL HIP ARTHROPLASTY  03/20/2012   Procedure: TOTAL HIP ARTHROPLASTY;  Surgeon: Jessy Oto, MD;  Location: [place];  Service: Orthopedics;  Laterality: Left;  Left total hip replacement, ceramic on poly    Current Outpatient Medications  Medication Sig Dispense Refill  . allopurinol (ZYLOPRIM) 300 MG tablet Take 300 mg by mouth daily.      Marland Kitchen aspirin EC 81 MG tablet Take 81 mg by mouth daily.    . furosemide (LASIX) 80 MG tablet Take 40 mg by mouth daily.     . metoprolol succinate (TOPROL-XL) 25 MG 24 hr tablet TAKE ONE TABLET BY MOUTH ONCE DAILY 30 tablet 6  . nitroGLYCERIN (NITROSTAT) 0.4 MG SL tablet Place 1 tablet (0.4 mg total) under the tongue every 5 (five) minutes x 3 doses as needed. For chest  pain. If no relief after 3 rd dose, proceed to the ED for an evaluation. 25 tablet 3  . Omega-3 Fatty Acids (FISH OIL MAXIMUM STRENGTH) 1200 MG CAPS Take 2 capsules by mouth daily.     Marland Kitchen omeprazole (PRILOSEC) 20 MG capsule Take 20 mg by mouth daily.      . potassium chloride SA (K-DUR,KLOR-CON) 20 MEQ tablet Take 1 tablet by mouth daily as needed.    . pravastatin (PRAVACHOL) 80 MG tablet Take 80 mg by mouth daily.     No current facility-administered medications for this visit.    Allergies:  Motrin [ibuprofen] and Tylenol [acetaminophen]   Social History: The patient  reports that he quit smoking about 13 years ago. His smoking use included  cigarettes. He started smoking about 57 years ago. He has a 126.00 pack-year smoking history. he has never used smokeless tobacco. He reports that he does not drink alcohol or use drugs.   Family History: The patient's family history includes Colon cancer in his brother; Coronary artery disease in his other; Stroke in his other.   ROS:  Please see the history of present illness. Otherwise, complete review of systems is positive for numb feeling in his right wrist, fullness in his left breast/pectoral area - plans to discuss with Dr. Luan Pulling at upcoming visit.  All other systems are reviewed and negative.   Physical Exam: VS:  BP (!) 186/76   Pulse (!) 59   Ht 5\' 10"  (1.778 m)   Wt 198 lb 9.6 oz (90.1 kg)   BMI 28.50 kg/m , BMI Body mass index is 28.5 kg/m.  Wt Readings from Last 3 Encounters:  11/23/17 198 lb 9.6 oz (90.1 kg)  05/27/17 195 lb (88.5 kg)  05/27/17 193 lb (87.5 kg)    General: Overweight elderly male, appears comfortable at rest. HEENT: Conjunctiva and lids normal, oropharynx clear. Neck: Supple, no elevated JVP, carotid bruits left greater than right, no thyromegaly. Lungs: Clear to auscultation, nonlabored breathing at rest. Cardiac: Regular rate and rhythm, no S3 or significant systolic murmur, no pericardial rub. Abdomen: Soft, nontender, bowel sounds present, no guarding or rebound. Extremities: No pitting edema, distal pulses 2+. Skin: Warm and dry. Musculoskeletal: No kyphosis. Neuropsychiatric: Alert and oriented x3, affect grossly appropriate.  ECG: I personally reviewed the tracing from 05/27/2017 which showed sinus bradycardia.  Recent Labwork:  May 2018: Hemoglobin 14.5, platelets 117, BUN 15, creatinine 1.77, potassium 3.5, AST 38, ALT 41, cholesterol 110, triglycerides 124, HDL 32, LDL 53, TSH 1.47  Other Studies Reviewed Today:  Carotid Dopplers 6/56/8127: 51-70% LICA stenosis, occluded RICA.  Lexiscan Cardiolite 11/04/2015:  There was no ST  segment deviation noted during stress.  Findings consistent with prior inferior/inferolateral myocardial infarction with moderate peri-infarct ischemia.  This is an intermediate risk study.  The left ventricular ejection fraction is normal (55-65%).  Assessment and Plan:  1.  CAD with history of DES to the RCA in 2007.  We have continued medical therapy, last ischemic workup was in 2016 as detailed above showing inferior/inferolateral scar with peri-infarct ischemia.  He had some recent right sided thoracic discomfort, overall atypical.  Follow-up ECG today shows no significant changes.  Continue with observation for now.  2.  Renal insufficiency with single kidney, creatinine 1.8 range.  3.  Bilateral carotid artery disease with occluded RICA and moderate LICA disease.  He continues to follow with Dr. [name].  4.  Hyperlipidemia, recent LDL 53 on Pravachol.  Current medicines were reviewed  with the patient today.   Orders Placed This Encounter  Procedures  . EKG 12-Lead    Disposition: Follow-up in 6 months.  Signed, Satira Sark, MD, [facility] 11/23/2017 2:28 PM    [facility] [place], [place], [place] [address] Phone: [phone]; Fax: [phone]

## 2017-11-23 NOTE — Patient Instructions (Signed)

## 2017-11-28 DIAGNOSIS — N138 Other obstructive and reflux uropathy: Secondary | ICD-10-CM | POA: Diagnosis not present

## 2017-11-28 DIAGNOSIS — R972 Elevated prostate specific antigen [PSA]: Secondary | ICD-10-CM | POA: Diagnosis not present

## 2017-11-28 DIAGNOSIS — N401 Enlarged prostate with lower urinary tract symptoms: Secondary | ICD-10-CM | POA: Diagnosis not present

## 2017-11-28 DIAGNOSIS — N261 Atrophy of kidney (terminal): Secondary | ICD-10-CM | POA: Diagnosis not present

## 2017-12-15 NOTE — Progress Notes (Signed)
Established Carotid Patient   History of Present Illness   Derek [name] is a 72 y.o. (May 14, 1946) male who presents with chief complaint: right wrist numbness.  Previous carotid studies demonstrated: RICA occlusion, LICA 53-66% stenosis.  Patient has no history of TIA or stroke symptom.  The patient has never had amaurosis fugax or monocular blindness.  The patient has never had facial drooping or hemiplegia.  The patient has never had receptive or expressive aphasia.    The patient's PMH, PSH, SH, and FamHx are unchanged from 05/27/17.  Current Outpatient Medications  Medication Sig Dispense Refill  . allopurinol (ZYLOPRIM) 300 MG tablet Take 300 mg by mouth daily.      Marland Kitchen aspirin EC 81 MG tablet Take 81 mg by mouth daily.    . furosemide (LASIX) 80 MG tablet Take 40 mg by mouth daily.     . metoprolol succinate (TOPROL-XL) 25 MG 24 hr tablet TAKE ONE TABLET BY MOUTH ONCE DAILY 30 tablet 6  . nitroGLYCERIN (NITROSTAT) 0.4 MG SL tablet Place 1 tablet (0.4 mg total) under the tongue every 5 (five) minutes x 3 doses as needed. For chest pain. If no relief after 3 rd dose, proceed to the ED for an evaluation. 25 tablet 3  . Omega-3 Fatty Acids (FISH OIL MAXIMUM STRENGTH) 1200 MG CAPS Take 2 capsules by mouth daily.     Marland Kitchen omeprazole (PRILOSEC) 20 MG capsule Take 20 mg by mouth daily.      . potassium chloride SA (K-DUR,KLOR-CON) 20 MEQ tablet Take 1 tablet by mouth daily as needed.    . pravastatin (PRAVACHOL) 80 MG tablet Take 80 mg by mouth daily.     No current facility-administered medications for this visit.     On ROS today: no motor complications, no change in speech   Physical Examination   Vitals:   12/16/17 1516 12/16/17 1518  BP: (!) 157/73 (!) 163/77  Pulse: (!) 52   Resp: 16   Temp: 98.2 F (36.8 C)   TempSrc: Oral   SpO2: 99%   Weight: 200 lb (90.7 kg)   Height: 5\' 10"  (1.778 m)    Body mass index is 28.7 kg/m.  General Alert, O x 3, WD, NAD  Neck Supple,  mid-line trachea,    Pulmonary Sym exp, good B air movt, CTA B  Cardiac RRR, Nl S1, S2, no Murmurs, No rubs, No S3,S4  Vascular Vessel Right Left  Radial Palpable Palpable  Brachial Palpable Palpable  Carotid Palpable, No Bruit Palpable, No Bruit  Aorta Not palpable N/A  Femoral Palpable Palpable  Popliteal Not palpable Not palpable  PT Faintly palpable Faintly palpable  DP Faintly palpable Faintly palpable    Gastro- intestinal soft, non-distended, non-tender to palpation, No guarding or rebound, no HSM, no masses, no CVAT B, No palpable prominent aortic pulse,    Musculo- skeletal M/S 5/5 throughout  , Extremities without ischemic changes    Neurologic Cranial nerves 2-12 intact , Pain and light touch intact in extremities , Motor exam as listed above    Non-Invasive Vascular Imaging   B Carotid Duplex (12/16/2017):   R ICA stenosis:  Occluded  R VA: patent and antegrade  L ICA stenosis:  60-79% (326/83 c/s)  L VA: patent and antegrade   Medical Decision Making   Derek [name] is a 72 y.o. male who presents with: known asx R ICA occlusion, asx ICA stenosis 60-79%, chronic kidney disease stage 2  EDV not quite at 80% threshold.  In this patient with CKD, would favor L carotid angiography over CTA Neck.  Based on the patient's vascular studies and examination, I have offered the patient: repeat B carotid duplex in 6 months.  I discussed in depth with the patient the nature of atherosclerosis, and emphasized the importance of maximal medical management including strict control of blood pressure, blood glucose, and lipid levels, antiplatelet agents, obtaining regular exercise, and cessation of smoking.    The patient is aware that without maximal medical management the underlying atherosclerotic disease process will progress, limiting the benefit of any interventions. The patient is currently on a statin: Pravachol.  The patient is currently on an anti-platelet:  ASA.  Thank you for allowing [place] to participate in this patient's care.   Adele Barthel, MD, FACS [facility] Office: [phone] Pager: [phone]

## 2017-12-16 ENCOUNTER — Ambulatory Visit (HOSPITAL_COMMUNITY)
Admission: RE | Admit: 2017-12-16 | Discharge: 2017-12-16 | Disposition: A | Discharge status: Home / Self Care | Payer: Medicare Other | Source: Ambulatory Visit | Attending: Vascular Surgery | Admitting: Vascular Surgery

## 2017-12-16 ENCOUNTER — Ambulatory Visit: Payer: Medicare Other | Admitting: Vascular Surgery

## 2017-12-16 ENCOUNTER — Encounter: Payer: Self-pay | Admitting: Vascular Surgery

## 2017-12-16 VITALS — BP 163/77 | HR 52 | Temp 98.2°F | Resp 16 | Ht 70.0 in | Wt 200.0 lb

## 2017-12-16 DIAGNOSIS — I6523 Occlusion and stenosis of bilateral carotid arteries: Secondary | ICD-10-CM

## 2017-12-16 DIAGNOSIS — I779 Disorder of arteries and arterioles, unspecified: Secondary | ICD-10-CM | POA: Insufficient documentation

## 2017-12-16 DIAGNOSIS — I6521 Occlusion and stenosis of right carotid artery: Secondary | ICD-10-CM | POA: Diagnosis not present

## 2017-12-16 DIAGNOSIS — I739 Peripheral vascular disease, unspecified: Secondary | ICD-10-CM

## 2017-12-16 LAB — VAS US CAROTID
LEFT ECA DIAS: -14 cm/s
LEFT VERTEBRAL DIAS: 20 cm/s
Left CCA dist dias: -18 cm/s
Left CCA dist sys: -56 cm/s
Left CCA prox dias: 16 cm/s
Left CCA prox sys: 68 cm/s
Left ICA dist dias: -56 cm/s
Left ICA dist sys: -188 cm/s
Left ICA prox dias: -42 cm/s
Left ICA prox sys: -116 cm/s
RIGHT ECA DIAS: -8 cm/s
Right CCA prox sys: 81 cm/s

## 2017-12-19 NOTE — Addendum Note (Signed)
Addended by: Lianne Cure A on: 12/19/2017 12:33 PM   Modules accepted: Orders

## 2017-12-20 DIAGNOSIS — I10 Essential (primary) hypertension: Secondary | ICD-10-CM | POA: Diagnosis not present

## 2017-12-20 DIAGNOSIS — I2510 Atherosclerotic heart disease of native coronary artery without angina pectoris: Secondary | ICD-10-CM | POA: Diagnosis not present

## 2017-12-23 ENCOUNTER — Other Ambulatory Visit (HOSPITAL_COMMUNITY): Payer: Self-pay | Admitting: Pulmonary Disease

## 2017-12-23 DIAGNOSIS — R599 Enlarged lymph nodes, unspecified: Secondary | ICD-10-CM

## 2018-01-05 ENCOUNTER — Encounter: Payer: Self-pay | Admitting: Internal Medicine

## 2018-01-06 ENCOUNTER — Ambulatory Visit (HOSPITAL_COMMUNITY)
Admission: RE | Admit: 2018-01-06 | Discharge: 2018-01-06 | Disposition: A | Discharge status: Home / Self Care | Payer: Medicare Other | Source: Ambulatory Visit | Attending: Pulmonary Disease | Admitting: Pulmonary Disease

## 2018-01-06 ENCOUNTER — Encounter (HOSPITAL_COMMUNITY): Payer: Self-pay

## 2018-01-06 DIAGNOSIS — R591 Generalized enlarged lymph nodes: Secondary | ICD-10-CM | POA: Insufficient documentation

## 2018-01-06 DIAGNOSIS — R599 Enlarged lymph nodes, unspecified: Secondary | ICD-10-CM

## 2018-01-06 DIAGNOSIS — R221 Localized swelling, mass and lump, neck: Secondary | ICD-10-CM | POA: Diagnosis not present

## 2018-01-06 LAB — POCT I-STAT CREATININE: Creatinine, Ser: 1.8 mg/dL — ABNORMAL HIGH (ref 0.61–1.24)

## 2018-01-06 MED ORDER — IOPAMIDOL (ISOVUE-300) INJECTION 61%
60.0000 mL | Freq: Once | INTRAVENOUS | Status: AC | PRN
  Administered 2018-01-06: 60 mL via INTRAVENOUS

## 2018-01-30 ENCOUNTER — Ambulatory Visit (HOSPITAL_COMMUNITY)
Admission: RE | Admit: 2018-01-30 | Discharge: 2018-01-30 | Disposition: A | Discharge status: Home / Self Care | Payer: Medicare Other | Source: Ambulatory Visit | Attending: Acute Care | Admitting: Acute Care

## 2018-01-30 DIAGNOSIS — I2510 Atherosclerotic heart disease of native coronary artery without angina pectoris: Secondary | ICD-10-CM | POA: Diagnosis not present

## 2018-01-30 DIAGNOSIS — Z122 Encounter for screening for malignant neoplasm of respiratory organs: Secondary | ICD-10-CM | POA: Insufficient documentation

## 2018-01-30 DIAGNOSIS — R911 Solitary pulmonary nodule: Secondary | ICD-10-CM | POA: Insufficient documentation

## 2018-01-30 DIAGNOSIS — N62 Hypertrophy of breast: Secondary | ICD-10-CM | POA: Insufficient documentation

## 2018-01-30 DIAGNOSIS — J439 Emphysema, unspecified: Secondary | ICD-10-CM | POA: Insufficient documentation

## 2018-01-30 DIAGNOSIS — I700 Atherosclerosis of aorta: Secondary | ICD-10-CM | POA: Diagnosis not present

## 2018-01-30 DIAGNOSIS — Z87891 Personal history of nicotine dependence: Secondary | ICD-10-CM | POA: Diagnosis not present

## 2018-02-02 ENCOUNTER — Encounter: Payer: Self-pay | Admitting: Internal Medicine

## 2018-02-03 ENCOUNTER — Other Ambulatory Visit: Payer: Self-pay | Admitting: Acute Care

## 2018-02-03 DIAGNOSIS — Z87891 Personal history of nicotine dependence: Secondary | ICD-10-CM

## 2018-02-03 DIAGNOSIS — Z122 Encounter for screening for malignant neoplasm of respiratory organs: Secondary | ICD-10-CM

## 2018-02-07 ENCOUNTER — Other Ambulatory Visit: Payer: Self-pay | Admitting: Cardiology

## 2018-03-16 ENCOUNTER — Other Ambulatory Visit: Payer: Self-pay

## 2018-03-16 ENCOUNTER — Ambulatory Visit (AMBULATORY_SURGERY_CENTER): Payer: Self-pay | Admitting: *Deleted

## 2018-03-16 ENCOUNTER — Telehealth: Payer: Self-pay | Admitting: *Deleted

## 2018-03-16 VITALS — Ht 70.0 in | Wt 204.0 lb

## 2018-03-16 DIAGNOSIS — Z86010 Personal history of colonic polyps: Secondary | ICD-10-CM

## 2018-03-16 DIAGNOSIS — Z800 Family history of malignant neoplasm of digestive organs: Secondary | ICD-10-CM

## 2018-03-16 NOTE — Telephone Encounter (Signed)
John,  I saw this gentleman in [place] today.  He has a significant cardiac history and I would like you to review his chart for his upcoming colon 4-18.  He had a STEMI in 2006 and he had significant blockages noted but echo is WNL.  No A Fib noted .   Please advise,  Thanks Lelan Pons

## 2018-03-16 NOTE — Progress Notes (Signed)
No egg or soy allergy known to patient  No issues with past sedation with any surgeries  or procedures, no intubation problems  No diet pills per patient No home 02 use per patient  No blood thinners per patient  Pt denies issues with constipation  Pt has   No A fib but has irregular heart rate  EMMI video sent to pt's e mail  Wife in [place] today with pt.  message to J Nulty to review this chart before procedure to verify ok for LEC colon

## 2018-03-16 NOTE — Telephone Encounter (Signed)
Derek [name].  This pt is cleared for anesthetic care at [facility].  Thanks,  Osvaldo Angst

## 2018-03-17 ENCOUNTER — Encounter: Payer: Self-pay | Admitting: Internal Medicine

## 2018-03-20 NOTE — Telephone Encounter (Signed)
Called and left a message on pts voicemail, it is okay to proceed with his schedule Colonoscopy. Advised to call back if having questions.

## 2018-03-30 ENCOUNTER — Other Ambulatory Visit: Payer: Self-pay

## 2018-03-30 ENCOUNTER — Encounter: Payer: Self-pay | Admitting: Internal Medicine

## 2018-03-30 ENCOUNTER — Ambulatory Visit (AMBULATORY_SURGERY_CENTER): Payer: Medicare Other | Admitting: Internal Medicine

## 2018-03-30 VITALS — BP 132/54 | HR 58 | Temp 98.6°F | Resp 10 | Ht 70.0 in | Wt 204.0 lb

## 2018-03-30 DIAGNOSIS — Z86010 Personal history of colonic polyps: Secondary | ICD-10-CM | POA: Diagnosis present

## 2018-03-30 DIAGNOSIS — D120 Benign neoplasm of cecum: Secondary | ICD-10-CM

## 2018-03-30 DIAGNOSIS — D122 Benign neoplasm of ascending colon: Secondary | ICD-10-CM | POA: Diagnosis not present

## 2018-03-30 DIAGNOSIS — D126 Benign neoplasm of colon, unspecified: Secondary | ICD-10-CM

## 2018-03-30 DIAGNOSIS — K219 Gastro-esophageal reflux disease without esophagitis: Secondary | ICD-10-CM | POA: Diagnosis not present

## 2018-03-30 DIAGNOSIS — D125 Benign neoplasm of sigmoid colon: Secondary | ICD-10-CM

## 2018-03-30 DIAGNOSIS — K635 Polyp of colon: Secondary | ICD-10-CM

## 2018-03-30 MED ORDER — SODIUM CHLORIDE 0.9 % IV SOLN: 500.0000 mL | Freq: Once | INTRAVENOUS | Status: DC

## 2018-03-30 NOTE — Progress Notes (Signed)
Called to room to assist during endoscopic procedure.  Patient ID and intended procedure confirmed with present staff. Received instructions for my participation in the procedure from the performing physician.  

## 2018-03-30 NOTE — Op Note (Signed)
[facility] Patient Name: Derek [name] Procedure Date: 03/30/2018 2:26 PM MRN: [id number] Endoscopist: Gatha Mayer , MD Age: 72 Referring MD:  Date of Birth: Apr 14, 1946 Gender: Male Account #: [id number] Procedure:                Colonoscopy Indications:              Surveillance: Personal history of adenomatous                            polyps on last colonoscopy 3 years ago, Last                            colonoscopy: 2016 Medicines:                Propofol per Anesthesia, Monitored Anesthesia Care Procedure:                Pre-Anesthesia Assessment:                           - Prior to the procedure, a History and Physical                            was performed, and patient medications and                            allergies were reviewed. The patient's tolerance of                            previous anesthesia was also reviewed. The risks                            and benefits of the procedure and the sedation                            options and risks were discussed with the patient.                            All questions were answered, and informed consent                            was obtained. Prior Anticoagulants: The patient has                            taken no previous anticoagulant or antiplatelet                            agents. ASA Grade Assessment: III - A patient with                            severe systemic disease. After reviewing the risks                            and benefits, the patient was deemed in  satisfactory condition to undergo the procedure.                           After obtaining informed consent, the colonoscope                            was passed under direct vision. Throughout the                            procedure, the patient's blood pressure, pulse, and                            oxygen saturations were monitored continuously. The                            Colonoscope was  introduced through the anus and                            advanced to the the cecum, identified by                            appendiceal orifice and ileocecal valve. The                            colonoscopy was performed without difficulty. The                            patient tolerated the procedure well. The quality                            of the bowel preparation was good. The bowel                            preparation used was Miralax. The ileocecal valve,                            appendiceal orifice, and rectum were photographed. Scope In: 2:27:21 PM Scope Out: 2:40:13 PM Scope Withdrawal Time: 0 hours 9 minutes 38 seconds  Total Procedure Duration: 0 hours 12 minutes 52 seconds  Findings:                 Three sessile polyps were found in the sigmoid                            colon, ascending colon and cecum. The polyps were                            diminutive in size. These polyps were removed with                            a cold snare. Resection and retrieval were                            complete. Verification of patient identification  for the specimen was done. Estimated blood loss was                            minimal.                           Multiple small and large-mouthed diverticula were                            found in the left colon. There was narrowing of the                            colon in association with the diverticular opening.                           The exam was otherwise without abnormality on                            direct and retroflexion views. Complications:            No immediate complications. Estimated Blood Loss:     Estimated blood loss was minimal. Impression:               - Three diminutive polyps in the sigmoid colon, in                            the ascending colon and in the cecum, removed with                            a cold snare. Resected and retrieved.                            - Moderate diverticulosis in the left colon. There                            was narrowing of the colon in association with the                            diverticular opening.                           - The examination was otherwise normal on direct                            and retroflexion views.                           - Personal history of colonic polyps adenoma and                            ssp 2016. Recommendation:           - Patient has a contact number available for                            emergencies. The signs  and symptoms of potential                            delayed complications were discussed with the                            patient. Return to normal activities tomorrow.                            Written discharge instructions were provided to the                            patient.                           - Resume previous diet.                           - Continue present medications.                           - Repeat colonoscopy is recommended for                            surveillance. The colonoscopy date will be                            determined after pathology results from today's                            exam become available for review. Gatha Mayer, MD 03/30/2018 2:50:43 PM This report has been signed electronically.

## 2018-03-30 NOTE — Patient Instructions (Addendum)
I removed 3 small polyps that look benign. I will let you know pathology results and when to have another routine colonoscopy by mail and/or My Chart.  You also still have a condition called diverticulosis - common and not usually a problem. Please read the handout provided.  I appreciate the opportunity to care for you. Gatha Mayer, MD, [facility]   [place] handout provided Diverticulitis handout provided    YOU HAD AN ENDOSCOPIC PROCEDURE TODAY AT [place]:   Refer to the procedure report that was given to you for any specific questions about what was found during the examination.  If the procedure report does not answer your questions, please call your gastroenterologist to clarify.  If you requested that your care partner not be given the details of your procedure findings, then the procedure report has been included in a sealed envelope for you to review at your convenience later.  YOU SHOULD EXPECT: Some feelings of bloating in the abdomen. Passage of more gas than usual.  Walking can help get rid of the air that was put into your GI tract during the procedure and reduce the bloating. If you had a lower endoscopy (such as a colonoscopy or flexible sigmoidoscopy) you may notice spotting of blood in your stool or on the toilet paper. If you underwent a bowel prep for your procedure, you may not have a normal bowel movement for a few days.  Please Note:  You might notice some irritation and congestion in your nose or some drainage.  This is from the oxygen used during your procedure.  There is no need for concern and it should clear up in a day or so.  SYMPTOMS TO REPORT IMMEDIATELY:   Following lower endoscopy (colonoscopy or flexible sigmoidoscopy):  Excessive amounts of blood in the stool  Significant tenderness or worsening of abdominal pains  Swelling of the abdomen that is new, acute  Fever of 100F or higher    For urgent or emergent issues, a  gastroenterologist can be reached at any hour by calling [phone].   DIET:  We do recommend a small meal at first, but then you may proceed to your regular diet.  Drink plenty of fluids but you should avoid alcoholic beverages for 24 hours.  ACTIVITY:  You should plan to take it easy for the rest of today and you should NOT DRIVE or use heavy machinery until tomorrow (because of the sedation medicines used during the test).    FOLLOW UP: Our staff will call the number listed on your records the next business day following your procedure to check on you and address any questions or concerns that you may have regarding the information given to you following your procedure. If we do not reach you, we will leave a message.  However, if you are feeling well and you are not experiencing any problems, there is no need to return our call.  We will assume that you have returned to your regular daily activities without incident.  If any biopsies were taken you will be contacted by phone or by letter within the next 1-3 weeks.  Please call us at [phone] if you have not heard about the biopsies in 3 weeks.    SIGNATURES/CONFIDENTIALITY: You and/or your care partner have signed paperwork which will be entered into your electronic medical record.  These signatures attest to the fact that that the information above on your After Visit Summary has been reviewed  and is understood.  Full responsibility of the confidentiality of this discharge information lies with you and/or your care-partner. 

## 2018-03-30 NOTE — Progress Notes (Signed)
Report to PACU, RN, vss, BBS= Clear.  

## 2018-04-03 ENCOUNTER — Telehealth: Payer: Self-pay | Admitting: *Deleted

## 2018-04-03 NOTE — Telephone Encounter (Signed)
  Follow up Call-  Call back number 03/30/2018  Post procedure Call Back phone  # [phone]  Permission to leave phone message Yes  Some recent data might be hidden     Patient questions:  Do you have a fever, pain , or abdominal swelling? No. Pain Score  0 *  Have you tolerated food without any problems? Yes.    Have you been able to return to your normal activities? Yes.    Do you have any questions about your discharge instructions: Diet   No. Medications  No. Follow up visit  No.  Do you have questions or concerns about your Care? No.  Actions: * If pain score is 4 or above: No action needed, pain <4.

## 2018-04-09 ENCOUNTER — Encounter: Payer: Self-pay | Admitting: Internal Medicine

## 2018-04-09 DIAGNOSIS — Z86010 Personal history of colonic polyps: Secondary | ICD-10-CM

## 2018-04-09 NOTE — Progress Notes (Signed)
2 ssp Recall 2024

## 2018-04-17 DIAGNOSIS — D692 Other nonthrombocytopenic purpura: Secondary | ICD-10-CM | POA: Diagnosis not present

## 2018-04-17 DIAGNOSIS — L570 Actinic keratosis: Secondary | ICD-10-CM | POA: Diagnosis not present

## 2018-04-17 DIAGNOSIS — Z85828 Personal history of other malignant neoplasm of skin: Secondary | ICD-10-CM | POA: Diagnosis not present

## 2018-04-17 DIAGNOSIS — L821 Other seborrheic keratosis: Secondary | ICD-10-CM | POA: Diagnosis not present

## 2018-06-04 NOTE — Progress Notes (Signed)
Cardiology Office Note  Date: 06/05/2018   ID: Franz, Svec [birth year], MRN [id number]  PCP: [name], MD  Primary Cardiologist: [name], MD   Chief Complaint  Patient presents with  . Coronary Artery Disease    History of Present Illness: Derek [name] is a 72 y.o. male last seen in December 2018.  He presents for a follow-up visit.  Since last assessment he has had no significant angina symptoms or nitroglycerin requirement.  He tells me that he has had cramps in his legs, occurs bilaterally, usually at nighttime, has some discomfort in his hips when he walks.  Symptoms do not sound like classic claudication.  He continues to follow with Dr. Luan Pulling, will be having a physical with lab work in the next few months.  He reports compliance with his medications.  Cardiac regimen includes aspirin, Lasix, Toprol-XL, potassium supplements, Pravachol, and as needed nitroglycerin.  Past Medical History:  Diagnosis Date  . Anxiety   . Arthritis   . Cataract    removed both eyes with lens implants  . Chronic idiopathic thrombocytopenia (HCC)   . Chronic renal insufficiency    Single kidney  . Coronary atherosclerosis of native coronary vessel    DES RCA 2007  . Depression   . Essential hypertension, benign   . GERD (gastroesophageal reflux disease)   . Hx of colonic polyps 01/01/2015  . Mixed hyperlipidemia   . Myocardial infarction [facility]) 2006-07-05   STEMI   . Peripheral vascular disease [facility])     Past Surgical History:  Procedure Laterality Date  . CARDIAC CATHETERIZATION  2010   Patent stent and nonobsturctive cad following an abnormal cardiolite study may of 2010 with an apical lateral defect  . CARPAL TUNNEL RELEASE  11/28/2012   Procedure: CARPAL TUNNEL RELEASE;  Surgeon: Jessy Oto, MD;  Location: [facility];  Service: Orthopedics;  Laterality: Left;  Left anterior submuscular transposition of ulnar nerve at elbow, left open carpal tunnel release    . COLONOSCOPY    . ELBOW SURGERY Left 2013  . [place]   screws 1962  . HARDWARE REMOVAL  03/20/2012   Procedure: HARDWARE REMOVAL;  Surgeon: Jessy Oto, MD;  Location: [place] [facility];  Service: Orthopedics;  Laterality: Left;  Hardware removal 3 knowles pins left hip  . HERNIA REPAIR    . TONSILLECTOMY    . TOTAL HIP ARTHROPLASTY  03/20/2012   Procedure: TOTAL HIP ARTHROPLASTY;  Surgeon: Jessy Oto, MD;  Location: [facility] [place];  Service: Orthopedics;  Laterality: Left;  Left total hip replacement, ceramic on poly    Current Outpatient Medications  Medication Sig Dispense Refill  . allopurinol (ZYLOPRIM) 300 MG tablet Take 300 mg by mouth daily.      Marland Kitchen aspirin EC 81 MG tablet Take 81 mg by mouth daily.    . diazepam (VALIUM) 2 MG tablet Take 2 mg by mouth every 6 (six) hours as needed for anxiety.    . furosemide (LASIX) 80 MG tablet Take 40 mg by mouth daily.     . metoprolol succinate (TOPROL-XL) 25 MG 24 hr tablet TAKE 1 TABLET BY MOUTH ONCE DAILY 30 tablet 6  . Multiple Vitamins-Minerals (ICAPS AREDS 2 PO) Take by mouth daily.    . nitroGLYCERIN (NITROSTAT) 0.4 MG SL tablet Place 1 tablet (0.4 mg total) under the tongue every 5 (five) minutes x 3 doses as needed (if no relief after 3rd dose, proceed to the  ED for an evaluation). For chest pain. If no relief after 3 rd dose, proceed to the ED for an evaluation. 25 tablet 3  . Omega-3 Fatty Acids (FISH OIL MAXIMUM STRENGTH) 1200 MG CAPS Take 2 capsules by mouth daily.     Marland Kitchen omeprazole (PRILOSEC) 20 MG capsule Take 20 mg by mouth daily.      . potassium chloride SA (K-DUR,KLOR-CON) 20 MEQ tablet Take by mouth.    . pravastatin (PRAVACHOL) 80 MG tablet Take 80 mg by mouth daily.     No current facility-administered medications for this visit.    Allergies:  Motrin [ibuprofen] and Tylenol [acetaminophen]   Social History: The patient  reports that he quit smoking about 13 years ago. His smoking use included cigarettes. He started  smoking about 57 years ago. He has a 126.00 pack-year smoking history. He has never used smokeless tobacco. He reports that he does not drink alcohol or use drugs.   ROS:  Please see the history of present illness. Otherwise, complete review of systems is positive for hearing loss.  All other systems are reviewed and negative.   Physical Exam: VS:  BP (!) 163/75   Pulse 70   Ht 5\' 10"  (5.631 m)   Wt 204 lb 12.8 oz (92.9 kg)   SpO2 95%   BMI 29.39 kg/m , BMI Body mass index is 29.39 kg/m.  Wt Readings from Last 3 Encounters:  06/05/18 204 lb 12.8 oz (92.9 kg)  03/30/18 204 lb (92.5 kg)  03/16/18 204 lb (92.5 kg)    General: Patient appears comfortable at rest. HEENT: Conjunctiva and lids normal, oropharynx clear. Neck: Supple, no elevated JVP, bilateral carotid bruits, no thyromegaly. Lungs: Clear to auscultation, nonlabored breathing at rest. Cardiac: Regular rate and rhythm, no S3 or significant systolic murmur, no pericardial rub. Abdomen: Soft, nontender, bowel sounds present. Extremities: No pitting edema, distal pulses 1-2+. Skin: Warm and dry. Musculoskeletal: No kyphosis. Neuropsychiatric: Alert and oriented x3, affect grossly appropriate.  ECG: I personally reviewed the tracing from 11/23/2017 which showed sinus bradycardia.  Recent Labwork: 01/06/2018: Creatinine, Ser 1.80  May 2018: Hemoglobin 14.5, platelets 117, BUN 15, creatinine 1.77, potassium 3.5, AST 38, ALT 41, cholesterol 110, triglycerides 124, HDL 32, LDL 53, TSH 1.47  Other Studies Reviewed Today:  Carotid Dopplers on 12/16/2017: Known occlusion of the RICA, 60 to 49% LICA stenosis.  Lexiscan Cardiolite 11/04/2015:  There was no ST segment deviation noted during stress.  Findings consistent with prior inferior/inferolateral myocardial infarction with moderate peri-infarct ischemia.  This is an intermediate risk study.  The left ventricular ejection fraction is normal (55-65%).  Assessment and  Plan:  1.  CAD status post DES to the RCA in 2007.  He reports no progressive angina symptoms on medical therapy with ischemic testing in 2016 demonstrating inferior/inferolateral scar and peri-infarct ischemia.  2.  Bilateral leg discomfort, also right hip pain with ambulation.  We will obtain lower extremity arterial Dopplers and ABIs to assess for PAD although description of symptoms is not entirely classic.  3.  Bilateral carotid artery disease, occluded RICA and moderate stenosis of the LICA.  He continues to follow with Dr. [name].  4.  Mixed hyperlipidemia on Pravachol.  Continues to follow with Dr. Luan Pulling and is due for follow-up lab work this year.  Current medicines were reviewed with the patient today.  Disposition: Follow-up in 6 months.   Signed, Satira Sark, MD, [facility] 06/05/2018 3:12 PM    [facility] [place], [place], [place] [address] Phone: [phone]; Fax: [phone]

## 2018-06-05 ENCOUNTER — Encounter: Payer: Self-pay | Admitting: Cardiology

## 2018-06-05 ENCOUNTER — Telehealth: Payer: Self-pay | Admitting: Cardiology

## 2018-06-05 ENCOUNTER — Ambulatory Visit: Payer: Medicare Other | Admitting: Cardiology

## 2018-06-05 VITALS — BP 163/75 | HR 70 | Ht 70.0 in | Wt 204.8 lb

## 2018-06-05 DIAGNOSIS — M79604 Pain in right leg: Secondary | ICD-10-CM | POA: Diagnosis not present

## 2018-06-05 DIAGNOSIS — I6523 Occlusion and stenosis of bilateral carotid arteries: Secondary | ICD-10-CM | POA: Diagnosis not present

## 2018-06-05 DIAGNOSIS — E782 Mixed hyperlipidemia: Secondary | ICD-10-CM

## 2018-06-05 DIAGNOSIS — M79605 Pain in left leg: Secondary | ICD-10-CM | POA: Diagnosis not present

## 2018-06-05 DIAGNOSIS — I25119 Atherosclerotic heart disease of native coronary artery with unspecified angina pectoris: Secondary | ICD-10-CM | POA: Diagnosis not present

## 2018-06-05 MED ORDER — NITROGLYCERIN 0.4 MG SL SUBL: 0.4000 mg | SUBLINGUAL_TABLET | SUBLINGUAL | 3 refills | Status: DC | PRN

## 2018-06-05 NOTE — Telephone Encounter (Signed)
Pre-cert Verification for the following procedure   LE arterial doppler with ABI's dx: leg pain scheduled for 07-26-18

## 2018-06-05 NOTE — Patient Instructions (Addendum)
Medication Instructions:   Your physician recommends that you continue on your current medications as directed. Please refer to the Current Medication list given to you today.  Labwork:  NONE  Testing/Procedures:  Your physician has requested that you have a lower extremity arterial exercise duplex with ankle brachial index. During this test, exercise and ultrasound are used to evaluate arterial blood flow in the legs. Allow one hour for this exam. There are no restrictions or special instructions. Your physician has requested that you have an ankle brachial index (ABI). During this test an ultrasound and blood pressure cuff are used to evaluate the arteries that supply the arms and legs with blood. Allow thirty minutes for this exam. There are no restrictions or special instructions.   Follow-Up:  Your physician recommends that you schedule a follow-up appointment in: 6 months. You will receive a reminder letter in the mail in about 4 months reminding you to call and schedule your appointment. If you don't receive this letter, please contact our office.  Any Other Special Instructions Will Be Listed Below (If Applicable).  If you need a refill on your cardiac medications before your next appointment, please call your pharmacy.

## 2018-06-16 ENCOUNTER — Other Ambulatory Visit: Payer: Self-pay | Admitting: Cardiology

## 2018-06-16 DIAGNOSIS — I739 Peripheral vascular disease, unspecified: Secondary | ICD-10-CM

## 2018-06-19 DIAGNOSIS — I10 Essential (primary) hypertension: Secondary | ICD-10-CM | POA: Diagnosis not present

## 2018-06-19 DIAGNOSIS — I129 Hypertensive chronic kidney disease with stage 1 through stage 4 chronic kidney disease, or unspecified chronic kidney disease: Secondary | ICD-10-CM | POA: Diagnosis not present

## 2018-06-19 DIAGNOSIS — I2510 Atherosclerotic heart disease of native coronary artery without angina pectoris: Secondary | ICD-10-CM | POA: Diagnosis not present

## 2018-06-21 ENCOUNTER — Encounter (HOSPITAL_COMMUNITY): Payer: Medicare Other

## 2018-06-21 ENCOUNTER — Ambulatory Visit: Payer: Medicare Other | Admitting: Vascular Surgery

## 2018-06-27 DIAGNOSIS — I129 Hypertensive chronic kidney disease with stage 1 through stage 4 chronic kidney disease, or unspecified chronic kidney disease: Secondary | ICD-10-CM | POA: Diagnosis not present

## 2018-06-27 DIAGNOSIS — I10 Essential (primary) hypertension: Secondary | ICD-10-CM | POA: Diagnosis not present

## 2018-06-27 DIAGNOSIS — Z125 Encounter for screening for malignant neoplasm of prostate: Secondary | ICD-10-CM | POA: Diagnosis not present

## 2018-06-27 DIAGNOSIS — I2510 Atherosclerotic heart disease of native coronary artery without angina pectoris: Secondary | ICD-10-CM | POA: Diagnosis not present

## 2018-07-04 NOTE — Progress Notes (Signed)
Established Carotid Patient   History of Present Illness   Derek [name] is a 72 y.o. (06/23/1946) male who presents with chief complaint: R>L intermittent claudication.  Pt is has what is described as a BLE ABI scheduled with his cardiologist next month.  Previous carotid studies demonstrated: RICA occlusion stenosis, LICA 97-98% stenosis.  Patient has no history of TIA or stroke symptom.  The patient has never had amaurosis fugax or monocular blindness.  The patient has never had facial drooping or hemiplegia.  The patient has never had receptive or expressive aphasia.  The patient has had no CVA or TIA sx.  The patient's PMH, PSH, SH, and FamHx were reviewed on 07/05/18 are unchanged from 12/16/17.  Current Outpatient Medications  Medication Sig Dispense Refill  . allopurinol (ZYLOPRIM) 300 MG tablet Take 300 mg by mouth daily.      Marland Kitchen aspirin EC 81 MG tablet Take 81 mg by mouth daily.    . diazepam (VALIUM) 2 MG tablet Take 2 mg by mouth every 6 (six) hours as needed for anxiety.    . furosemide (LASIX) 80 MG tablet Take 40 mg by mouth daily.     . metoprolol succinate (TOPROL-XL) 25 MG 24 hr tablet TAKE 1 TABLET BY MOUTH ONCE DAILY 30 tablet 6  . Multiple Vitamins-Minerals (ICAPS AREDS 2 PO) Take by mouth daily.    . nitroGLYCERIN (NITROSTAT) 0.4 MG SL tablet Place 1 tablet (0.4 mg total) under the tongue every 5 (five) minutes x 3 doses as needed (if no relief after 3rd dose, proceed to the ED for an evaluation). For chest pain. If no relief after 3 rd dose, proceed to the ED for an evaluation. 25 tablet 3  . Omega-3 Fatty Acids (FISH OIL MAXIMUM STRENGTH) 1200 MG CAPS Take 2 capsules by mouth daily.     Marland Kitchen omeprazole (PRILOSEC) 20 MG capsule Take 20 mg by mouth daily.      . potassium chloride SA (K-DUR,KLOR-CON) 20 MEQ tablet Take by mouth.    . pravastatin (PRAVACHOL) 80 MG tablet Take 80 mg by mouth daily.     No current facility-administered medications for this visit.      On ROS today: no CVA sx, +intermittent claudication    Physical Examination   Vitals:   07/05/18 1341 07/05/18 1343  BP: (!) 174/76 (!) 172/79  Pulse: 71   Resp: 16   Temp: (!) 97.4 F (36.3 C)   TempSrc: Oral   SpO2: 95%   Weight: 204 lb 12.8 oz (92.9 kg)   Height: 5\' 10"  (1.778 m)    Body mass index is 29.39 kg/m.  General Alert, O x 3, WD, NAD  Neck Supple, mid-line trachea,    Pulmonary Sym exp, good B air movt, CTA B  Cardiac RRR, Nl S1, S2, no Murmurs, No rubs, No S3,S4  Vascular Vessel Right Left  Radial Palpable Palpable  Brachial Palpable Palpable  Carotid Palpable, No Bruit Palpable, No Bruit  Aorta Not palpable N/A  Femoral Palpable Palpable  Popliteal Not palpable Not palpable  PT Not palpable Not palpable  DP Not palpable Not palpable    Gastro- intestinal soft, non-distended, non-tender to palpation, No guarding or rebound, no HSM, no masses, no CVAT B, No palpable prominent aortic pulse,    Musculo- skeletal M/S 5/5 throughout  , Extremities without ischemic changes, small eschars on both legs, small varicosities B    Neurologic Cranial nerves 2-12 intact , Pain and light touch  intact in extremities , Motor exam as listed above    Non-Invasive Vascular Imaging   B Carotid Duplex (07/05/2018):   R ICA stenosis:  Occluded  R VA: patent, high resistance   L ICA stenosis:  60-79% (300/94 c/s to 332/94 c/s)  L VA: patent and antegrade   Medical Decision Making   JAZION ATTEBERRY is a 72 y.o. male who presents with: known asx R ICA occlusion, asx L ICA stenosis 60-79%, CKD Stage 2, possible BLE PAD   Patient has R carotid occlusion so L carotid velocities might reflect compensatory flow.  Regardless there wasn't significant change at this point.  Based on the patient's vascular studies and examination, I have offered the patient: q6 month B carotid duplex  Will see what the screening ABI find.  His exam is consistent with moderate PAD.  I  discussed in depth with the patient the nature of atherosclerosis, and emphasized the importance of maximal medical management including strict control of blood pressure, blood glucose, and lipid levels, antiplatelet agents, obtaining regular exercise, and cessation of smoking.    The patient is aware that without maximal medical management the underlying atherosclerotic disease process will progress, limiting the benefit of any interventions.  The patient is currently on a statin: Pravachol.   The patient is currently on an anti-platelet: ASA.  Thank you for allowing [place] to participate in this patient's care.   Adele Barthel, MD, FACS [facility] Office: [phone] Pager: [phone]

## 2018-07-05 ENCOUNTER — Other Ambulatory Visit: Payer: Self-pay

## 2018-07-05 ENCOUNTER — Encounter: Payer: Self-pay | Admitting: Vascular Surgery

## 2018-07-05 ENCOUNTER — Ambulatory Visit (HOSPITAL_COMMUNITY)
Admission: RE | Admit: 2018-07-05 | Discharge: 2018-07-05 | Disposition: A | Discharge status: Home / Self Care | Payer: Medicare Other | Source: Ambulatory Visit | Attending: Vascular Surgery | Admitting: Vascular Surgery

## 2018-07-05 ENCOUNTER — Ambulatory Visit: Payer: Medicare Other | Admitting: Vascular Surgery

## 2018-07-05 VITALS — BP 172/79 | HR 71 | Temp 97.4°F | Resp 16 | Ht 70.0 in | Wt 204.8 lb

## 2018-07-05 DIAGNOSIS — E785 Hyperlipidemia, unspecified: Secondary | ICD-10-CM | POA: Insufficient documentation

## 2018-07-05 DIAGNOSIS — I708 Atherosclerosis of other arteries: Secondary | ICD-10-CM | POA: Diagnosis not present

## 2018-07-05 DIAGNOSIS — I2510 Atherosclerotic heart disease of native coronary artery without angina pectoris: Secondary | ICD-10-CM | POA: Insufficient documentation

## 2018-07-05 DIAGNOSIS — I10 Essential (primary) hypertension: Secondary | ICD-10-CM | POA: Insufficient documentation

## 2018-07-05 DIAGNOSIS — I6523 Occlusion and stenosis of bilateral carotid arteries: Secondary | ICD-10-CM

## 2018-07-05 DIAGNOSIS — I6521 Occlusion and stenosis of right carotid artery: Secondary | ICD-10-CM

## 2018-07-05 DIAGNOSIS — I6522 Occlusion and stenosis of left carotid artery: Secondary | ICD-10-CM | POA: Diagnosis not present

## 2018-07-26 ENCOUNTER — Ambulatory Visit (INDEPENDENT_AMBULATORY_CARE_PROVIDER_SITE_OTHER): Payer: Medicare Other

## 2018-07-26 DIAGNOSIS — I739 Peripheral vascular disease, unspecified: Secondary | ICD-10-CM

## 2018-07-31 ENCOUNTER — Other Ambulatory Visit: Payer: Self-pay

## 2018-07-31 DIAGNOSIS — I6523 Occlusion and stenosis of bilateral carotid arteries: Secondary | ICD-10-CM

## 2018-07-31 DIAGNOSIS — I6521 Occlusion and stenosis of right carotid artery: Secondary | ICD-10-CM

## 2018-08-03 ENCOUNTER — Telehealth: Payer: Self-pay | Admitting: *Deleted

## 2018-08-03 DIAGNOSIS — I739 Peripheral vascular disease, unspecified: Secondary | ICD-10-CM

## 2018-08-03 NOTE — Telephone Encounter (Signed)
Patient informed and agrees to see his vascular doctor [name]). Referral placed and copy sent to PCP.

## 2018-08-03 NOTE — Addendum Note (Signed)
Addended by: Merlene Laughter on: 08/03/2018 11:18 AM   Modules accepted: Orders

## 2018-08-03 NOTE — Telephone Encounter (Signed)
-----   Message from Satira Sark, MD sent at 07/27/2018 12:57 PM EDT ----- Results reviewed.  This study along with Doppler imaging consistent with PAD, although not entirely clear that all of it should be symptom provoking.  If he would like referral for further vascular evaluation, we can set him up to see one of our vascular specialist within the group. A copy of this test should be forwarded to [name], MD.

## 2018-09-05 ENCOUNTER — Ambulatory Visit: Payer: Medicare Other | Admitting: Vascular Surgery

## 2018-09-05 ENCOUNTER — Encounter: Payer: Self-pay | Admitting: Vascular Surgery

## 2018-09-05 ENCOUNTER — Other Ambulatory Visit: Payer: Self-pay

## 2018-09-05 DIAGNOSIS — I739 Peripheral vascular disease, unspecified: Secondary | ICD-10-CM | POA: Insufficient documentation

## 2018-09-05 NOTE — Progress Notes (Signed)
Patient name: Derek [name] MRN: [id number] DOB: October 02, 1946 Sex: male  REASON FOR VISIT: Bilateral lower extremity claudication  HPI: Derek [name] is a 72 y.o. male that presents for short interval follow-up given some increasing cramping in his calves at night to be reevaluated for claudication.  Patient's previously been followed by Dr. [name].  Patient states over the last month he has had increasing episodes at night where he awakens from sleep with cramping in his calves.  He states this is worse at night when he awakens from sleep in his bed not necessarily when he is walking.  He denies any rest pain in the foot itself.  No prior lower extremity revascularization.  Past Medical History:  Diagnosis Date  . Anxiety   . Arthritis   . Cataract    removed both eyes with lens implants  . Chronic idiopathic thrombocytopenia (HCC)   . Chronic renal insufficiency    Single kidney  . Coronary atherosclerosis of native coronary vessel    DES RCA 2007  . Depression   . Essential hypertension, benign   . GERD (gastroesophageal reflux disease)   . Hx of colonic polyps 01/01/2015  . Mixed hyperlipidemia   . Myocardial infarction [facility]) 2006-07-05   STEMI   . Peripheral vascular disease [facility])     Past Surgical History:  Procedure Laterality Date  . CARDIAC CATHETERIZATION  2010   Patent stent and nonobsturctive cad following an abnormal cardiolite study may of 2010 with an apical lateral defect  . CARPAL TUNNEL RELEASE  11/28/2012   Procedure: CARPAL TUNNEL RELEASE;  Surgeon: Jessy Oto, MD;  Location: [place];  Service: Orthopedics;  Laterality: Left;  Left anterior submuscular transposition of ulnar nerve at elbow, left open carpal tunnel release  . COLONOSCOPY    . ELBOW SURGERY Left 2013  . [place]   screws 1962  . HARDWARE REMOVAL  03/20/2012   Procedure: HARDWARE REMOVAL;  Surgeon: Jessy Oto, MD;  Location: [place];  Service: Orthopedics;  Laterality: Left;   Hardware removal 3 knowles pins left hip  . HERNIA REPAIR    . TONSILLECTOMY    . TOTAL HIP ARTHROPLASTY  03/20/2012   Procedure: TOTAL HIP ARTHROPLASTY;  Surgeon: Jessy Oto, MD;  Location: [place];  Service: Orthopedics;  Laterality: Left;  Left total hip replacement, ceramic on poly    Family History  Problem Relation Age of Onset  . Stroke Other   . Coronary artery disease Other   . Colon cancer Brother        has colostomy   . Esophageal cancer Neg Hx   . Stomach cancer Neg Hx   . Rectal cancer Neg Hx   . Colon polyps Neg Hx     SOCIAL HISTORY: Social History   Tobacco Use  . Smoking status: Former Smoker    Packs/day: 3.00    Years: 42.00    Pack years: 126.00    Types: Cigarettes    Start date: 10/30/1960    Last attempt to quit: 07/23/2004    Years since quitting: 14.1  . Smokeless tobacco: Never Used  . Tobacco comment: Counseled to remain smoke free  Substance Use Topics  . Alcohol use: No    Alcohol/week: 0.0 standard drinks    Allergies  Allergen Reactions  . Motrin [Ibuprofen] Other (See Comments)    One kidney  . Tylenol [Acetaminophen] Other (See Comments)    One kidney  Current Outpatient Medications  Medication Sig Dispense Refill  . allopurinol (ZYLOPRIM) 300 MG tablet Take 300 mg by mouth daily.      Marland Kitchen aspirin EC 81 MG tablet Take 81 mg by mouth daily.    . diazepam (VALIUM) 2 MG tablet Take 2 mg by mouth every 6 (six) hours as needed for anxiety.    . furosemide (LASIX) 80 MG tablet Take 40 mg by mouth daily.     . metoprolol succinate (TOPROL-XL) 25 MG 24 hr tablet TAKE 1 TABLET BY MOUTH ONCE DAILY 30 tablet 6  . Multiple Vitamins-Minerals (ICAPS AREDS 2 PO) Take by mouth daily.    . nitroGLYCERIN (NITROSTAT) 0.4 MG SL tablet Place 1 tablet (0.4 mg total) under the tongue every 5 (five) minutes x 3 doses as needed (if no relief after 3rd dose, proceed to the ED for an evaluation). For chest pain. If no relief after 3 rd dose, proceed to the  ED for an evaluation. 25 tablet 3  . Omega-3 Fatty Acids (FISH OIL MAXIMUM STRENGTH) 1200 MG CAPS Take 2 capsules by mouth daily.     Marland Kitchen omeprazole (PRILOSEC) 20 MG capsule Take 20 mg by mouth daily.      . potassium chloride SA (K-DUR,KLOR-CON) 20 MEQ tablet Take by mouth.    . pravastatin (PRAVACHOL) 80 MG tablet Take 80 mg by mouth daily.     No current facility-administered medications for this visit.     REVIEW OF SYSTEMS:  [X]  denotes positive finding, [ ]  denotes negative finding Cardiac  Comments:  Chest pain or chest pressure:    Shortness of breath upon exertion:    Short of breath when lying flat:    Irregular heart rhythm:        Vascular    Pain in calf, thigh, or hip brought on by ambulation:    Pain in feet at night that wakes you up from your sleep:     Blood clot in your veins:    Leg swelling:     Pain in calf that awakens him at night x   Pulmonary    Oxygen at home:    Productive cough:     Wheezing:         Neurologic    Sudden weakness in arms or legs:     Sudden numbness in arms or legs:     Sudden onset of difficulty speaking or slurred speech:    Temporary loss of vision in one eye:     Problems with dizziness:         Gastrointestinal    Blood in stool:     Vomited blood:         Genitourinary    Burning when urinating:     Blood in urine:        Psychiatric    Major depression:         Hematologic    Bleeding problems:    Problems with blood clotting too easily:        Skin    Rashes or ulcers:        Constitutional    Fever or chills:      PHYSICAL EXAM: Vitals:   09/05/18 1506 09/05/18 1508  BP: (!) 183/97 (!) 190/82  Pulse: 75   Resp: 16   Temp: 97.6 F (36.4 C)   TempSrc: Oral   SpO2: 95%   Weight: 90 kg   Height: 5\' 10"  (1.778 m)  GENERAL: The patient is a well-nourished male, in no acute distress. The vital signs are documented above. CARDIAC: There is a regular rate and rhythm.  VASCULAR:  2+ palpable radial  pulse bilateral upper extremity 2+ palpable right femoral pulse 1+ palpable left femoral pulse 2+ palpable right dorsalis pedis and posterior tibial pulse 1+ palpable left dorsalis pedis pulse PULMONARY: There is good air exchange bilaterally without wheezing or rales. ABDOMEN: Soft and non-tender with normal pitched bowel sounds.  MUSCULOSKELETAL: There are no major deformities or cyanosis. NEUROLOGIC: No focal weakness or paresthesias are detected. SKIN: There are no ulcers or rashes noted. PSYCHIATRIC: The patient has a normal affect.  DATA:   I independently reviewed his noninvasive imaging that shows an ABI of 1.09 in the right lower extremity and 0.83 in the left lower extremity.  He has triphasic waveforms at the right ankle and biphasic waveforms at the left ankle.  Assessment/Plan:  Discussed with Mr. Schopf that I do not think arterial insufficiency explains his calf cramping in the middle the night.  It is unusual that he has this cramping in the middle the night without exercise or walking given palpable dorsalis pedis pulses in both feet on exam.  His ABI is normal in the right leg at 1.09 with a triphasic waveform and only mildly abnormal in the left at 0.83 with biphasic waveform - even though he states both legs are equally affected by the midnight cramping.  I do not think he needs any vascular intervention at this time and has no evidence of critical limb ischemia.  Discussed even if his symptoms were more suggestive of claudication we typically manage this conservatively.  I'd be happy to see him back in 6 months with repeat ABIs to see how he is doing.  We can also get his carotid duplex at that time to continue surveillance of his left moderate grade stenosis (in setting of R ICA occlusion).   Marty Heck, MD [facility] [place] Office: [phone] Pager: [place]

## 2018-09-14 ENCOUNTER — Other Ambulatory Visit: Payer: Self-pay | Admitting: Cardiology

## 2018-09-27 ENCOUNTER — Other Ambulatory Visit (HOSPITAL_COMMUNITY): Payer: Self-pay | Admitting: Pulmonary Disease

## 2018-09-27 ENCOUNTER — Ambulatory Visit (HOSPITAL_COMMUNITY)
Admission: RE | Admit: 2018-09-27 | Discharge: 2018-09-27 | Disposition: A | Discharge status: Home / Self Care | Payer: Medicare Other | Source: Ambulatory Visit | Attending: Pulmonary Disease | Admitting: Pulmonary Disease

## 2018-09-27 DIAGNOSIS — M7989 Other specified soft tissue disorders: Secondary | ICD-10-CM | POA: Insufficient documentation

## 2018-09-27 DIAGNOSIS — S8011XA Contusion of right lower leg, initial encounter: Secondary | ICD-10-CM | POA: Diagnosis not present

## 2018-09-27 DIAGNOSIS — M660 Rupture of popliteal cyst: Secondary | ICD-10-CM | POA: Diagnosis not present

## 2018-09-27 DIAGNOSIS — M79604 Pain in right leg: Secondary | ICD-10-CM | POA: Diagnosis not present

## 2018-09-27 DIAGNOSIS — M79661 Pain in right lower leg: Secondary | ICD-10-CM | POA: Diagnosis not present

## 2018-09-27 DIAGNOSIS — R609 Edema, unspecified: Secondary | ICD-10-CM

## 2018-10-23 DIAGNOSIS — L821 Other seborrheic keratosis: Secondary | ICD-10-CM | POA: Diagnosis not present

## 2018-10-23 DIAGNOSIS — D692 Other nonthrombocytopenic purpura: Secondary | ICD-10-CM | POA: Diagnosis not present

## 2018-10-23 DIAGNOSIS — L820 Inflamed seborrheic keratosis: Secondary | ICD-10-CM | POA: Diagnosis not present

## 2018-10-23 DIAGNOSIS — L570 Actinic keratosis: Secondary | ICD-10-CM | POA: Diagnosis not present

## 2018-10-23 DIAGNOSIS — Z85828 Personal history of other malignant neoplasm of skin: Secondary | ICD-10-CM | POA: Diagnosis not present

## 2018-11-27 DIAGNOSIS — N261 Atrophy of kidney (terminal): Secondary | ICD-10-CM | POA: Diagnosis not present

## 2018-11-27 DIAGNOSIS — N138 Other obstructive and reflux uropathy: Secondary | ICD-10-CM | POA: Diagnosis not present

## 2018-11-29 ENCOUNTER — Ambulatory Visit: Payer: Medicare Other | Admitting: Cardiology

## 2018-11-29 ENCOUNTER — Encounter: Payer: Self-pay | Admitting: Cardiology

## 2018-11-29 VITALS — BP 163/83 | HR 72 | Ht 70.0 in | Wt 193.4 lb

## 2018-11-29 DIAGNOSIS — R600 Localized edema: Secondary | ICD-10-CM | POA: Diagnosis not present

## 2018-11-29 DIAGNOSIS — I25119 Atherosclerotic heart disease of native coronary artery with unspecified angina pectoris: Secondary | ICD-10-CM

## 2018-11-29 DIAGNOSIS — I6523 Occlusion and stenosis of bilateral carotid arteries: Secondary | ICD-10-CM | POA: Diagnosis not present

## 2018-11-29 DIAGNOSIS — I493 Ventricular premature depolarization: Secondary | ICD-10-CM

## 2018-11-29 MED ORDER — METOPROLOL SUCCINATE ER 50 MG PO TB24: 50.0000 mg | ORAL_TABLET | Freq: Every day | ORAL | 3 refills | Status: DC

## 2018-11-29 NOTE — Progress Notes (Signed)
Cardiology Office Note  Date: 11/29/2018   ID: Derek [name], DOB [birth year], MRN [id number]  PCP: [name], MD  Primary Cardiologist: [name], MD   Chief Complaint  Patient presents with  . Coronary Artery Disease    History of Present Illness: Derek [name] is a 72 y.o. male last seen in June.  He is here for a routine visit.  He does not report any angina symptoms or nitroglycerin use.  He complains of lower leg swelling, worse after he gets up on his feet during the day, better in the morning after having his legs elevated.  He does not use compression hose, I talked with him about this.  He is also on diuretic therapy per Dr. Luan Pulling.  LVEF is normal range.  He saw Dr. Carlis Abbott with [facility] in September.  At that time ABIs were 1.09 on the right and 0.83 on the left.  It was not felt that vascular insufficiency was causing his leg cramping.  He does have follow-up planned for carotid disease.  I personally reviewed his ECG today which shows sinus rhythm with ventricular bigeminy.  We discussed increasing his Toprol-XL dose.  He has had no dizziness or syncope.  Past Medical History:  Diagnosis Date  . Anxiety   . Arthritis   . Cataract   . Chronic idiopathic thrombocytopenia (HCC)   . Chronic renal insufficiency    Single kidney  . Coronary atherosclerosis of native coronary vessel    DES RCA 2007  . Depression   . Essential hypertension, benign   . GERD (gastroesophageal reflux disease)   . Hx of colonic polyps 01/01/2015  . Mixed hyperlipidemia   . Myocardial infarction [facility]) 2006-07-05   STEMI   . Peripheral vascular disease [facility])     Past Surgical History:  Procedure Laterality Date  . CARDIAC CATHETERIZATION  2010   Patent stent and nonobsturctive cad following an abnormal cardiolite study may of 2010 with an apical lateral defect  . CARPAL TUNNEL RELEASE  11/28/2012   Procedure: CARPAL TUNNEL RELEASE;  Surgeon: Jessy Oto, MD;  Location: [place];   Service: Orthopedics;  Laterality: Left;  Left anterior submuscular transposition of ulnar nerve at elbow, left open carpal tunnel release  . COLONOSCOPY    . ELBOW SURGERY Left 2013  . [place]   screws 1962  . HARDWARE REMOVAL  03/20/2012   Procedure: HARDWARE REMOVAL;  Surgeon: Jessy Oto, MD;  Location: [place];  Service: Orthopedics;  Laterality: Left;  Hardware removal 3 knowles pins left hip  . HERNIA REPAIR    . TONSILLECTOMY    . TOTAL HIP ARTHROPLASTY  03/20/2012   Procedure: TOTAL HIP ARTHROPLASTY;  Surgeon: Jessy Oto, MD;  Location: [place];  Service: Orthopedics;  Laterality: Left;  Left total hip replacement, ceramic on poly    Current Outpatient Medications  Medication Sig Dispense Refill  . allopurinol (ZYLOPRIM) 300 MG tablet Take 300 mg by mouth daily.      Marland Kitchen aspirin EC 81 MG tablet Take 81 mg by mouth daily.    . diazepam (VALIUM) 2 MG tablet Take 2 mg by mouth every 6 (six) hours as needed for anxiety.    . furosemide (LASIX) 80 MG tablet Take 40 mg by mouth daily.     . Multiple Vitamins-Minerals (ICAPS AREDS 2 PO) Take by mouth daily.    . nitroGLYCERIN (NITROSTAT) 0.4 MG SL tablet Place 1 tablet (0.4 mg  total) under the tongue every 5 (five) minutes x 3 doses as needed (if no relief after 3rd dose, proceed to the ED for an evaluation). For chest pain. If no relief after 3 rd dose, proceed to the ED for an evaluation. 25 tablet 3  . Omega-3 Fatty Acids (FISH OIL MAXIMUM STRENGTH) 1200 MG CAPS Take 2 capsules by mouth daily.     Marland Kitchen omeprazole (PRILOSEC) 20 MG capsule Take 20 mg by mouth daily.      . potassium chloride SA (K-DUR,KLOR-CON) 20 MEQ tablet Take by mouth. Takes occasionally    . pravastatin (PRAVACHOL) 80 MG tablet Take 80 mg by mouth daily.    . metoprolol succinate (TOPROL XL) 50 MG 24 hr tablet Take 1 tablet (50 mg total) by mouth daily. Take with or immediately following a meal. 90 tablet 3   No current facility-administered  medications for this visit.    Allergies:  Motrin [ibuprofen] and Tylenol [acetaminophen]   Social History: The patient  reports that he quit smoking about 14 years ago. His smoking use included cigarettes. He started smoking about 58 years ago. He has a 126.00 pack-year smoking history. He has never used smokeless tobacco. He reports that he does not drink alcohol or use drugs.   ROS:  Please see the history of present illness. Otherwise, complete review of systems is positive for hearing loss.  All other systems are reviewed and negative.   Physical Exam: VS:  BP (!) 163/83   Pulse 72   Ht 5\' 10"  (1.778 m)   Wt 193 lb 6.4 oz (87.7 kg)   SpO2 98%   BMI 27.75 kg/m , BMI Body mass index is 27.75 kg/m.  Wt Readings from Last 3 Encounters:  11/29/18 193 lb 6.4 oz (87.7 kg)  09/05/18 198 lb 6.4 oz (90 kg)  07/05/18 204 lb 12.8 oz (92.9 kg)    General: Patient appears comfortable at rest. HEENT: Conjunctiva and lids normal, oropharynx clear. Neck: Supple, no elevated JVP or carotid bruits, no thyromegaly. Lungs: Clear to auscultation, nonlabored breathing at rest. Cardiac: Regular rate and rhythm with ectopy, no S3 or significant systolic murmur. Abdomen: Soft, nontender, bowel sounds present. Extremities: 2+ lower leg edema and venous stasis. Skin: Warm and dry. Musculoskeletal: No kyphosis. Neuropsychiatric: Alert and oriented x3, affect grossly appropriate.  ECG: I personally reviewed the tracing from 11/23/2017 which showed sinus bradycardia.  Recent Labwork: 01/06/2018: Creatinine, Ser 1.80  May 2018: Hemoglobin 14.5, platelets 117, BUN 15, creatinine 1.77, potassium 3.5, AST 38, ALT 41, cholesterol 110, triglycerides 124, HDL 32, LDL 53, TSH 1.47  Other Studies Reviewed Today:  Carotid Dopplers on 12/16/2017: Known occlusion of the RICA, 60 to 81% LICA stenosis.  Lexiscan Cardiolite 11/04/2015:  There was no ST segment deviation noted during stress.  Findings  consistent with prior inferior/inferolateral myocardial infarction with moderate peri-infarct ischemia.  This is an intermediate risk study.  The left ventricular ejection fraction is normal (55-65%).  Assessment and Plan:  1.  CAD status post DES to the RCA in 2007.  He does not report any active angina symptoms and continues on aspirin as well as statin therapy.  Plan to continue medical therapy and observation.  2.  Frequent PVCs, increase Toprol-XL to 50 mg daily.  He reports no syncope.  3.  Lower leg swelling, right greater than left.  Patient states that he underwent venous Dopplers per Dr. Luan Pulling without evidence of DVT.  He is on diuretics.  Symptoms are  better when he has his legs elevated at nighttime.  Consider use of compression stockings.  4.  Carotid artery disease, now follows with Dr. Carlis Abbott.  Current medicines were reviewed with the patient today.   Orders Placed This Encounter  Procedures  . EKG 12-Lead    Disposition: Follow-up in 6 months.  Signed, Satira Sark, MD, [facility] 11/29/2018 2:55 PM    [facility] [place], [place], [place] [address] Phone: [phone]; Fax: [phone]

## 2018-11-29 NOTE — Patient Instructions (Addendum)
Medication Instructions:   Your physician has recommended you make the following change in your medication:   Increase metoprolol succinate (toprol xl) to 50 mg by mouth daily. You may take 2 of your 25 mg tablets daily until they are finished.  Continue all other medications the same.  Labwork:  NONE  Testing/Procedures:  NONE  Follow-Up:  Your physician recommends that you schedule a follow-up appointment in: 6 months. You will receive a reminder letter in the mail in about 4 months reminding you to call and schedule your appointment. If you don't receive this letter, please contact our office.  Any Other Special Instructions Will Be Listed Below (If Applicable).  If you need a refill on your cardiac medications before your next appointment, please call your pharmacy.

## 2018-12-20 DIAGNOSIS — I2510 Atherosclerotic heart disease of native coronary artery without angina pectoris: Secondary | ICD-10-CM | POA: Diagnosis not present

## 2018-12-20 DIAGNOSIS — I129 Hypertensive chronic kidney disease with stage 1 through stage 4 chronic kidney disease, or unspecified chronic kidney disease: Secondary | ICD-10-CM | POA: Diagnosis not present

## 2018-12-20 DIAGNOSIS — N184 Chronic kidney disease, stage 4 (severe): Secondary | ICD-10-CM | POA: Diagnosis not present

## 2019-01-29 DIAGNOSIS — K219 Gastro-esophageal reflux disease without esophagitis: Secondary | ICD-10-CM | POA: Diagnosis not present

## 2019-01-29 DIAGNOSIS — I2510 Atherosclerotic heart disease of native coronary artery without angina pectoris: Secondary | ICD-10-CM | POA: Diagnosis not present

## 2019-01-29 DIAGNOSIS — I509 Heart failure, unspecified: Secondary | ICD-10-CM | POA: Diagnosis not present

## 2019-01-29 DIAGNOSIS — I10 Essential (primary) hypertension: Secondary | ICD-10-CM | POA: Diagnosis not present

## 2019-01-29 DIAGNOSIS — E782 Mixed hyperlipidemia: Secondary | ICD-10-CM | POA: Diagnosis not present

## 2019-01-31 ENCOUNTER — Ambulatory Visit (HOSPITAL_COMMUNITY)
Admission: RE | Admit: 2019-01-31 | Discharge: 2019-01-31 | Disposition: A | Discharge status: Home / Self Care | Payer: Medicare Other | Source: Ambulatory Visit | Attending: Acute Care | Admitting: Acute Care

## 2019-01-31 DIAGNOSIS — Z87891 Personal history of nicotine dependence: Secondary | ICD-10-CM | POA: Diagnosis not present

## 2019-01-31 DIAGNOSIS — Z122 Encounter for screening for malignant neoplasm of respiratory organs: Secondary | ICD-10-CM | POA: Diagnosis not present

## 2019-02-09 ENCOUNTER — Other Ambulatory Visit: Payer: Self-pay

## 2019-02-09 DIAGNOSIS — I739 Peripheral vascular disease, unspecified: Secondary | ICD-10-CM

## 2019-02-12 ENCOUNTER — Other Ambulatory Visit: Payer: Self-pay | Admitting: Acute Care

## 2019-02-12 DIAGNOSIS — Z122 Encounter for screening for malignant neoplasm of respiratory organs: Secondary | ICD-10-CM

## 2019-02-12 DIAGNOSIS — Z87891 Personal history of nicotine dependence: Secondary | ICD-10-CM

## 2019-02-13 ENCOUNTER — Ambulatory Visit (HOSPITAL_COMMUNITY)
Admission: RE | Admit: 2019-02-13 | Discharge: 2019-02-13 | Disposition: A | Discharge status: Home / Self Care | Payer: Medicare Other | Source: Ambulatory Visit | Attending: Family | Admitting: Family

## 2019-02-13 ENCOUNTER — Other Ambulatory Visit: Payer: Self-pay

## 2019-02-13 ENCOUNTER — Encounter: Payer: Self-pay | Admitting: Vascular Surgery

## 2019-02-13 ENCOUNTER — Ambulatory Visit (INDEPENDENT_AMBULATORY_CARE_PROVIDER_SITE_OTHER): Payer: Medicare Other | Admitting: Vascular Surgery

## 2019-02-13 ENCOUNTER — Ambulatory Visit (INDEPENDENT_AMBULATORY_CARE_PROVIDER_SITE_OTHER)
Admission: RE | Admit: 2019-02-13 | Discharge: 2019-02-13 | Disposition: A | Discharge status: Home / Self Care | Payer: Medicare Other | Source: Ambulatory Visit | Attending: Family | Admitting: Family

## 2019-02-13 VITALS — BP 175/93 | HR 98 | Temp 96.9°F | Resp 18 | Ht 70.0 in | Wt 190.9 lb

## 2019-02-13 DIAGNOSIS — I6523 Occlusion and stenosis of bilateral carotid arteries: Secondary | ICD-10-CM

## 2019-02-13 DIAGNOSIS — I739 Peripheral vascular disease, unspecified: Secondary | ICD-10-CM | POA: Diagnosis not present

## 2019-02-13 NOTE — Progress Notes (Signed)
Patient name: Derek [name] MRN: [id number] DOB: 03-02-1946 Sex: male  REASON FOR VISIT: 6 month follow-up bilateral lower extremity claudication/carotid surveillance  HPI: Derek [name] is a 73 y.o. male with hx CAD s/p MI 2007, HTN, HLD that presents for 6 month follow-up for lower extremity claudication and ongoing carotid surveillance.  Patient's previously been followed by Dr. [name].  Feels his legs are doing well.  No rest pain or tissue loss.  No significant claudication.  Regarding carotid disease no TIA or stroke.  Known R ICA occlusion.  Past Medical History:  Diagnosis Date  . Anxiety   . Arthritis   . Cataract   . Chronic idiopathic thrombocytopenia (HCC)   . Chronic renal insufficiency    Single kidney  . Coronary atherosclerosis of native coronary vessel    DES RCA 2007  . Depression   . Essential hypertension, benign   . GERD (gastroesophageal reflux disease)   . Hx of colonic polyps 01/01/2015  . Mixed hyperlipidemia   . Myocardial infarction [facility]) 2006-07-05   STEMI   . Peripheral vascular disease [facility])     Past Surgical History:  Procedure Laterality Date  . CARDIAC CATHETERIZATION  2010   Patent stent and nonobsturctive cad following an abnormal cardiolite study may of 2010 with an apical lateral defect  . CARPAL TUNNEL RELEASE  11/28/2012   Procedure: CARPAL TUNNEL RELEASE;  Surgeon: Jessy Oto, MD;  Location: [place];  Service: Orthopedics;  Laterality: Left;  Left anterior submuscular transposition of ulnar nerve at elbow, left open carpal tunnel release  . COLONOSCOPY    . ELBOW SURGERY Left 2013  . [place]   screws 1962  . HARDWARE REMOVAL  03/20/2012   Procedure: HARDWARE REMOVAL;  Surgeon: Jessy Oto, MD;  Location: [place];  Service: Orthopedics;  Laterality: Left;  Hardware removal 3 knowles pins left hip  . HERNIA REPAIR    . TONSILLECTOMY    . TOTAL HIP ARTHROPLASTY  03/20/2012   Procedure: TOTAL HIP ARTHROPLASTY;  Surgeon:  Jessy Oto, MD;  Location: [place];  Service: Orthopedics;  Laterality: Left;  Left total hip replacement, ceramic on poly    Family History  Problem Relation Age of Onset  . Stroke Other   . Coronary artery disease Other   . Colon cancer Brother        has colostomy   . Esophageal cancer Neg Hx   . Stomach cancer Neg Hx   . Rectal cancer Neg Hx   . Colon polyps Neg Hx     SOCIAL HISTORY: Social History   Tobacco Use  . Smoking status: Former Smoker    Packs/day: 3.00    Years: 42.00    Pack years: 126.00    Types: Cigarettes    Start date: 10/30/1960    Last attempt to quit: 07/23/2004    Years since quitting: 14.5  . Smokeless tobacco: Never Used  . Tobacco comment: Counseled to remain smoke free  Substance Use Topics  . Alcohol use: No    Alcohol/week: 0.0 standard drinks    Allergies  Allergen Reactions  . Acetaminophen Other (See Comments)    One kidney Pt has one Kidney   . Ibuprofen Other (See Comments)    One kidney Pt has one Kidney     Current Outpatient Medications  Medication Sig Dispense Refill  . allopurinol (ZYLOPRIM) 300 MG tablet Take 300 mg by mouth daily.      Marland Kitchen  aspirin EC 81 MG tablet Take 81 mg by mouth daily.    . diazepam (VALIUM) 2 MG tablet Take 2 mg by mouth every 6 (six) hours as needed for anxiety.    . furosemide (LASIX) 80 MG tablet Take 40 mg by mouth daily.     . metoprolol succinate (TOPROL XL) 50 MG 24 hr tablet Take 1 tablet (50 mg total) by mouth daily. Take with or immediately following a meal. 90 tablet 3  . Multiple Vitamins-Minerals (ICAPS AREDS 2 PO) Take by mouth daily.    . nitroGLYCERIN (NITROSTAT) 0.4 MG SL tablet Place 1 tablet (0.4 mg total) under the tongue every 5 (five) minutes x 3 doses as needed (if no relief after 3rd dose, proceed to the ED for an evaluation). For chest pain. If no relief after 3 rd dose, proceed to the ED for an evaluation. 25 tablet 3  . Omega-3 Fatty Acids (FISH OIL MAXIMUM STRENGTH) 1200 MG  CAPS Take 2 capsules by mouth daily.     Marland Kitchen omeprazole (PRILOSEC) 20 MG capsule Take 20 mg by mouth daily.      . potassium chloride SA (K-DUR,KLOR-CON) 20 MEQ tablet Take by mouth. Takes occasionally    . pravastatin (PRAVACHOL) 80 MG tablet Take 80 mg by mouth daily.     No current facility-administered medications for this visit.     REVIEW OF SYSTEMS:  [X]  denotes positive finding, [ ]  denotes negative finding Cardiac  Comments:  Chest pain or chest pressure:    Shortness of breath upon exertion:    Short of breath when lying flat:    Irregular heart rhythm:        Vascular    Pain in calf, thigh, or hip brought on by ambulation:    Pain in feet at night that wakes you up from your sleep:     Blood clot in your veins:    Leg swelling:     Pain in calf that awakens him at night    Pulmonary    Oxygen at home:    Productive cough:     Wheezing:         Neurologic    Sudden weakness in arms or legs:     Sudden numbness in arms or legs:     Sudden onset of difficulty speaking or slurred speech:    Temporary loss of vision in one eye:     Problems with dizziness:         Gastrointestinal    Blood in stool:     Vomited blood:         Genitourinary    Burning when urinating:     Blood in urine:        Psychiatric    Major depression:         Hematologic    Bleeding problems:    Problems with blood clotting too easily:        Skin    Rashes or ulcers:        Constitutional    Fever or chills:      PHYSICAL EXAM: Vitals:   02/13/19 1427 02/13/19 1428  BP: (!) 183/80 (!) 175/93  Pulse: 66 98  Resp: 18   Temp: (!) 96.9 F (36.1 C)   TempSrc: Oral   Weight: 190 lb 14.7 oz (86.6 kg)   Height: 5\' 10"  (1.778 m)     GENERAL: The patient is a well-nourished male, in no acute distress. The vital  signs are documented above. CARDIAC: There is a regular rate and rhythm.  VASCULAR:  2+ palpable radial pulse bilateral upper extremity 2+ palpable right femoral  pulse 1+ palpable left femoral pulse 2+ palpable right dorsalis pedis and posterior tibial pulse 1+ palpable left dorsalis pedis pulse PULMONARY: There is good air exchange bilaterally without wheezing or rales. ABDOMEN: Soft and non-tender with normal pitched bowel sounds.  MUSCULOSKELETAL: There are no major deformities or cyanosis. NEUROLOGIC: No focal weakness or paresthesias are detected.   DATA:   I independently reviewed his noninvasive imaging that shows an ABI of 1.04 in the right lower extremity and 0.89 in the left lower extremity.  He has triphasic waveforms at the ankle.  Carotid duplex: Right ICA occluded, now severe >80% stenosis left ICA with velocity 337/116.  Assessment/Plan:  Doing well from a lower extremity standpoint.  He has palpable dorsalis pedis pulses and well preserved ABIs with no significant symptoms that warrant intervention.  His left carotid disease has progressed to high-grade >80%  stenosis in the setting of a right ICA occlusion.  Given the increased risk for stroke during operative intervention with a contralateral occlusion, I think his best option would be a TCAR procedure (transcarotid stent).  We need a CTA neck to ensure that his anatomy fits the current device.  After CTA neck I will follow-up with him with the results and we can make plans moving forward for TCAR for open endarterectomy.   Marty Heck, MD [facility] [place] Office: [phone] Pager: [place]

## 2019-02-15 ENCOUNTER — Other Ambulatory Visit: Payer: Self-pay

## 2019-02-15 DIAGNOSIS — I6523 Occlusion and stenosis of bilateral carotid arteries: Secondary | ICD-10-CM

## 2019-03-05 ENCOUNTER — Ambulatory Visit (HOSPITAL_COMMUNITY)
Admission: RE | Admit: 2019-03-05 | Discharge: 2019-03-05 | Disposition: A | Discharge status: Home / Self Care | Payer: Medicare Other | Source: Ambulatory Visit | Attending: Vascular Surgery | Admitting: Vascular Surgery

## 2019-03-05 ENCOUNTER — Other Ambulatory Visit: Payer: Self-pay

## 2019-03-05 DIAGNOSIS — I6523 Occlusion and stenosis of bilateral carotid arteries: Secondary | ICD-10-CM

## 2019-03-05 LAB — POCT I-STAT CREATININE: Creatinine, Ser: 1.9 mg/dL — ABNORMAL HIGH (ref 0.61–1.24)

## 2019-03-05 MED ORDER — IOHEXOL 300 MG/ML  SOLN
75.0000 mL | Freq: Once | INTRAMUSCULAR | Status: AC | PRN
  Administered 2019-03-05: 60 mL via INTRAVENOUS

## 2019-03-05 MED ORDER — IOHEXOL 350 MG/ML SOLN
75.0000 mL | Freq: Once | INTRAVENOUS | Status: AC | PRN
  Administered 2019-03-05: 60 mL via INTRAVENOUS

## 2019-03-13 ENCOUNTER — Telehealth: Payer: Self-pay | Admitting: Vascular Surgery

## 2019-03-13 NOTE — Telephone Encounter (Signed)
73 year old male that was recently seen in clinic for ongoing surveillance of his carotid artery disease.  He has a known right ICA occlusion that is chronic and on ultrasound there was concern that he progressed to greater than 80 stenosis of his left ICA.  Ultimately a CTA was obtained for planning of TCAR.  I have reviewed his imaging and agree that he has approximate 50 to 60% stenosis on the left ICA.  Given that this is asymptomatic and we are in the middle of a Covid pandemic I discussed that I think any intervention would be too high risk at this time and we can continue surveillance.  Typical interventions is >80% for asymptomatic carotid disease.  I discussed seeing him back in 6 months with another carotid duplex.  I think if he does need something done in the future he would be a TCAR candidate.  Discussed symptoms if TIA/stroke and he should make our office aware if issues arise.  Marty Heck, MD [facility] [place] Office: [phone] Pager: [place]

## 2019-05-22 DIAGNOSIS — D1801 Hemangioma of skin and subcutaneous tissue: Secondary | ICD-10-CM | POA: Diagnosis not present

## 2019-05-22 DIAGNOSIS — B351 Tinea unguium: Secondary | ICD-10-CM | POA: Diagnosis not present

## 2019-05-22 DIAGNOSIS — Z85828 Personal history of other malignant neoplasm of skin: Secondary | ICD-10-CM | POA: Diagnosis not present

## 2019-05-22 DIAGNOSIS — L570 Actinic keratosis: Secondary | ICD-10-CM | POA: Diagnosis not present

## 2019-05-22 DIAGNOSIS — L821 Other seborrheic keratosis: Secondary | ICD-10-CM | POA: Diagnosis not present

## 2019-05-28 ENCOUNTER — Ambulatory Visit: Payer: Medicare Other | Admitting: Cardiology

## 2019-05-28 ENCOUNTER — Other Ambulatory Visit: Payer: Self-pay

## 2019-05-28 ENCOUNTER — Encounter: Payer: Self-pay | Admitting: *Deleted

## 2019-05-28 ENCOUNTER — Encounter: Payer: Self-pay | Admitting: Cardiology

## 2019-05-28 VITALS — BP 180/73 | HR 61 | Temp 97.3°F | Ht 70.0 in | Wt 203.8 lb

## 2019-05-28 DIAGNOSIS — I25119 Atherosclerotic heart disease of native coronary artery with unspecified angina pectoris: Secondary | ICD-10-CM

## 2019-05-28 DIAGNOSIS — I493 Ventricular premature depolarization: Secondary | ICD-10-CM

## 2019-05-28 DIAGNOSIS — E782 Mixed hyperlipidemia: Secondary | ICD-10-CM

## 2019-05-28 DIAGNOSIS — I6523 Occlusion and stenosis of bilateral carotid arteries: Secondary | ICD-10-CM

## 2019-05-28 MED ORDER — HYDRALAZINE HCL 25 MG PO TABS: 25.0000 mg | ORAL_TABLET | Freq: Three times a day (TID) | ORAL | 1 refills | Status: DC

## 2019-05-28 NOTE — Patient Instructions (Addendum)
Medication Instructions:    Your physician has recommended you make the following change in your medication:   Start hydralazine 25 mg by mouth twice daily  Continue all other medications the same  Labwork:  NONE  Testing/Procedures:  NONE  Follow-Up:  Your physician recommends that you schedule a follow-up appointment in: 6 months. You will receive a reminder letter in the mail in about 4 months reminding you to call and schedule your appointment. If you don't receive this letter, please contact our office.  Any Other Special Instructions Will Be Listed Below (If Applicable). Your physician has requested that you regularly monitor and record your blood pressure readings at home. Please use the same machine at the same time of day to check your readings and record them.  If you need a refill on your cardiac medications before your next appointment, please call your pharmacy.

## 2019-05-28 NOTE — Progress Notes (Signed)
Cardiology Office Note  Date: 05/28/2019   ID: Derek, [name] [birth year], MRN [id number]  PCP:  Celene Squibb, MD  Cardiologist:  [name], MD Electrophysiologist:  None   Chief Complaint  Patient presents with  . Coronary Artery Disease    History of Present Illness: Derek Derek [name] is a 73 y.o. male last seen in December 2019.  He presents for a routine visit.  He tells me that he has been doing well, no angina symptoms or palpitations. Toprol-XL was increased to 50 mg daily at the last visit.  He states that this made him feel more short of breath and he ultimately cut it back to 25 mg daily.  He does not feel any sense of heart rate irregularity or hear his heart anymore.  We discussed follow-up lab work.  Plan to request interval studies from Dr. Nevada [name].  I reviewed his medications and we discussed the addition of hydralazine for better blood pressure control.  He states that his systolics are usually at least 160 at home.  He has CKD stage III with single kidney and also history of leg swelling.  This limits use of ARB/ACE inhibitor and I question whether he would tolerate Norvasc due to potential for worsening leg swelling.  Dr. Carlis [name] with [facility], has known occlusion of the RICA and evidence of 50 to 60% stenosis of the LICA by CTA.  He remains on aspirin and statin.  Past Medical History:  Diagnosis Date  . Anxiety   . Arthritis   . Cataract   . Chronic idiopathic thrombocytopenia (HCC)   . Chronic renal insufficiency    Single kidney  . Coronary atherosclerosis of native coronary vessel    DES RCA 2007  . Depression   . Essential hypertension, benign   . GERD (gastroesophageal reflux disease)   . Hx of colonic polyps 01/01/2015  . Mixed hyperlipidemia   . Myocardial infarction [facility]) 2006-07-05   STEMI   . Peripheral vascular disease [facility])     Past Surgical History:  Procedure Laterality Date  . CARDIAC CATHETERIZATION  2010   Patent stent and  nonobsturctive cad following an abnormal cardiolite study may of 2010 with an apical lateral defect  . CARPAL TUNNEL RELEASE  11/28/2012   Procedure: CARPAL TUNNEL RELEASE;  Surgeon: Derek Oto, MD;  Location: [place];  Service: Orthopedics;  Laterality: Left;  Left anterior submuscular transposition of ulnar nerve at elbow, left open carpal tunnel release  . COLONOSCOPY    . ELBOW SURGERY Left 2013  . [place]   screws 1962  . HARDWARE REMOVAL  03/20/2012   Procedure: HARDWARE REMOVAL;  Surgeon: Derek Oto, MD;  Location: [place];  Service: Orthopedics;  Laterality: Left;  Hardware removal 3 knowles pins left hip  . HERNIA REPAIR    . TONSILLECTOMY    . TOTAL HIP ARTHROPLASTY  03/20/2012   Procedure: TOTAL HIP ARTHROPLASTY;  Surgeon: Derek Oto, MD;  Location: [place];  Service: Orthopedics;  Laterality: Left;  Left total hip replacement, ceramic on poly    Current Outpatient Medications  Medication Sig Dispense Refill  . allopurinol (ZYLOPRIM) 300 MG tablet Take 300 mg by mouth daily.      Marland Kitchen aspirin EC 81 MG tablet Take 81 mg by mouth daily.    Marland Kitchen atorvastatin (LIPITOR) 20 MG tablet Take 20 mg by mouth daily.    . diazepam (VALIUM) 2 MG tablet Take 2 mg  by mouth 2 (two) times a day.     . furosemide (LASIX) 80 MG tablet Take 40 mg by mouth daily.     . metoprolol succinate (TOPROL-XL) 50 MG 24 hr tablet Take 25 mg by mouth daily.    . nitroGLYCERIN (NITROSTAT) 0.4 MG SL tablet Place 1 tablet (0.4 mg total) under the tongue every 5 (five) minutes x 3 doses as needed (if no relief after 3rd dose, proceed to the ED for an evaluation). For chest pain. If no relief after 3 rd dose, proceed to the ED for an evaluation. 25 tablet 3  . Omega-3 Fatty Acids (FISH OIL MAXIMUM STRENGTH) 1200 MG CAPS Take 2 capsules by mouth daily.     Marland Kitchen omeprazole (PRILOSEC) 20 MG capsule Take 20 mg by mouth daily.      . hydrALAZINE (APRESOLINE) 25 MG tablet Take 1 tablet (25 mg total) by mouth 3  (three) times daily. 180 tablet 1   No current facility-administered medications for this visit.    Allergies:  Acetaminophen and Ibuprofen   Social History: The patient  reports that he quit smoking about 14 years ago. His smoking use included cigarettes. He started smoking about 58 years ago. He has a 126.00 pack-year smoking history. He has never used smokeless tobacco. He reports that he does not drink alcohol or use drugs.   ROS:  Please see the history of present illness. Otherwise, complete review of systems is positive for arthritic back and neck pain.  All other systems are reviewed and negative.   Physical Exam: VS:  BP (!) 180/73   Pulse 61   Temp (!) 97.3 F (36.3 C)   Ht 5\' 10"  (1.778 m)   Wt 203 lb 12.8 oz (92.4 kg)   SpO2 98%   BMI 29.24 kg/m , BMI Body mass index is 29.24 kg/m.  Wt Readings from Last 3 Encounters:  05/28/19 203 lb 12.8 oz (92.4 kg)  02/13/19 190 lb 14.7 oz (86.6 kg)  11/29/18 193 lb 6.4 oz (87.7 kg)    General: Patient appears comfortable at rest. HEENT: Conjunctiva and lids normal, oropharynx clear. Neck: Supple, no elevated JVP or carotid bruits, no thyromegaly. Lungs: Clear to auscultation, nonlabored breathing at rest. Cardiac: Regular rate and rhythm, no S3 or significant systolic murmur, no pericardial rub. Abdomen: Soft, nontender, no hepatomegaly, bowel sounds present, no guarding or rebound. Extremities: Mild lower leg edema, distal pulses 2+. Skin: Warm and dry. Musculoskeletal: No kyphosis. Neuropsychiatric: Alert and oriented x3, affect grossly appropriate.  ECG:  An ECG dated 11/29/2018 was personally reviewed today and demonstrated:  Sinus rhythm with a ventricular bigeminy.  Recent Labwork: 03/05/2019: Creatinine, Ser 1.90   Other Studies Reviewed Today:  Carotid Dopplerson 12/16/2017: Known occlusion of the RICA, 60 to 27% LICA stenosis.  Lexiscan Cardiolite 11/04/2015:  There was no ST segment deviation noted during  stress.  Findings consistent with prior inferior/inferolateral myocardial infarction with moderate peri-infarct ischemia.  This is an intermediate risk study.  The left ventricular ejection fraction is normal (55-65%).  Assessment and Plan:  1.  CAD status post DES to the RCA in 2007.  He continues on aspirin and statin therapy as well as low-dose beta-blocker, no active angina symptoms.  2.  Essential hypertension.  Add hydralazine 25 mg twice daily.  Avoiding Norvasc due to his history of recurring leg swelling and also ARB/ACE inhibitor with single kidney and CKD stage III.  3.  CKD stage III, last creatinine 1.9.  4.  Bilateral carotid artery disease followed by Dr. Carlis [name].  He is asymptomatic and on aspirin and statin.  5.  Frequent PVCs, none appreciated today on examination he does not complain of any palpitations.  Continue with current dose of beta-blocker.  Medication Adjustments/Labs and Tests Ordered: Current medicines are reviewed at length with the patient today.  Concerns regarding medicines are outlined above.   Tests Ordered: No orders of the defined types were placed in this encounter.   Medication Changes: Meds ordered this encounter  Medications  . hydrALAZINE (APRESOLINE) 25 MG tablet    Sig: Take 1 tablet (25 mg total) by mouth 3 (three) times daily.    Dispense:  180 tablet    Refill:  1    05/28/2019 NEW    Disposition:  Follow up 6 months in the [place] office.  Signed, Satira Sark, MD, [facility] 05/28/2019 2:08 PM    [place] [facility] [place], [place], [place] [address] Phone: [phone]; Fax: [phone]

## 2019-07-19 DIAGNOSIS — H35362 Drusen (degenerative) of macula, left eye: Secondary | ICD-10-CM | POA: Diagnosis not present

## 2019-07-31 DIAGNOSIS — I509 Heart failure, unspecified: Secondary | ICD-10-CM | POA: Diagnosis not present

## 2019-07-31 DIAGNOSIS — M109 Gout, unspecified: Secondary | ICD-10-CM | POA: Diagnosis not present

## 2019-07-31 DIAGNOSIS — R7301 Impaired fasting glucose: Secondary | ICD-10-CM | POA: Diagnosis not present

## 2019-07-31 DIAGNOSIS — I10 Essential (primary) hypertension: Secondary | ICD-10-CM | POA: Diagnosis not present

## 2019-07-31 DIAGNOSIS — E782 Mixed hyperlipidemia: Secondary | ICD-10-CM | POA: Diagnosis not present

## 2019-07-31 DIAGNOSIS — N183 Chronic kidney disease, stage 3 (moderate): Secondary | ICD-10-CM | POA: Diagnosis not present

## 2019-08-02 ENCOUNTER — Encounter: Payer: Self-pay | Admitting: Internal Medicine

## 2019-08-02 DIAGNOSIS — I5032 Chronic diastolic (congestive) heart failure: Secondary | ICD-10-CM | POA: Diagnosis not present

## 2019-08-02 DIAGNOSIS — I2510 Atherosclerotic heart disease of native coronary artery without angina pectoris: Secondary | ICD-10-CM | POA: Diagnosis not present

## 2019-08-02 DIAGNOSIS — R944 Abnormal results of kidney function studies: Secondary | ICD-10-CM | POA: Diagnosis not present

## 2019-08-02 DIAGNOSIS — I10 Essential (primary) hypertension: Secondary | ICD-10-CM | POA: Diagnosis not present

## 2019-08-02 DIAGNOSIS — N183 Chronic kidney disease, stage 3 (moderate): Secondary | ICD-10-CM | POA: Diagnosis not present

## 2019-08-07 DIAGNOSIS — R21 Rash and other nonspecific skin eruption: Secondary | ICD-10-CM | POA: Diagnosis not present

## 2019-08-07 DIAGNOSIS — W57XXXA Bitten or stung by nonvenomous insect and other nonvenomous arthropods, initial encounter: Secondary | ICD-10-CM | POA: Diagnosis not present

## 2019-08-21 DIAGNOSIS — M65352 Trigger finger, left little finger: Secondary | ICD-10-CM | POA: Insufficient documentation

## 2019-09-18 DIAGNOSIS — M65352 Trigger finger, left little finger: Secondary | ICD-10-CM | POA: Diagnosis not present

## 2019-10-16 DIAGNOSIS — M65352 Trigger finger, left little finger: Secondary | ICD-10-CM | POA: Diagnosis not present

## 2019-11-22 ENCOUNTER — Other Ambulatory Visit: Payer: Self-pay | Admitting: Cardiology

## 2019-12-27 DIAGNOSIS — N183 Chronic kidney disease, stage 3 unspecified: Secondary | ICD-10-CM | POA: Diagnosis not present

## 2019-12-27 DIAGNOSIS — N281 Cyst of kidney, acquired: Secondary | ICD-10-CM | POA: Insufficient documentation

## 2019-12-27 DIAGNOSIS — R5383 Other fatigue: Secondary | ICD-10-CM | POA: Diagnosis not present

## 2020-01-14 ENCOUNTER — Ambulatory Visit: Payer: Medicare Other | Admitting: Cardiology

## 2020-01-14 ENCOUNTER — Other Ambulatory Visit: Payer: Self-pay

## 2020-01-14 ENCOUNTER — Encounter: Payer: Self-pay | Admitting: Cardiology

## 2020-01-14 VITALS — BP 158/82 | HR 82 | Ht 70.0 in | Wt 207.0 lb

## 2020-01-14 DIAGNOSIS — I6523 Occlusion and stenosis of bilateral carotid arteries: Secondary | ICD-10-CM

## 2020-01-14 DIAGNOSIS — I25119 Atherosclerotic heart disease of native coronary artery with unspecified angina pectoris: Secondary | ICD-10-CM

## 2020-01-14 DIAGNOSIS — E782 Mixed hyperlipidemia: Secondary | ICD-10-CM

## 2020-01-14 DIAGNOSIS — N1832 Chronic kidney disease, stage 3b: Secondary | ICD-10-CM

## 2020-01-14 NOTE — Progress Notes (Signed)
Cardiology Office Note  Date: 01/14/2020   ID: Derek [name], Derek [name] October 18, 1946, MRN [id number]  PCP:  Celene Squibb, MD  Cardiologist:  [name], MD Electrophysiologist:  None   Chief Complaint  Patient presents with  . Cardiac follow-up    History of Present Illness: Derek [name] is a 74 y.o. male last seen in June 2020.  He presents for a routine visit.  He does not describe any definite angina symptoms, has had 3 episodes of a very brief fleeting epigastric discomfort that was nonexertional.  He does not report any progressive palpitations and has had no syncope.  Patient follows with Dr. Carlis Abbott with [facility].  Carotid Dopplers from March of last year are outlined below.  He did have a CTA subsequently that demonstrated 50 to 60% stenosis of the left LICA, RICA is known to be occluded.  This is being followed at this point.  He is overdue for follow-up.  I reviewed his medications which are outlined below.  Cardiac regimen includes aspirin, Lipitor, Lasix, hydralazine, Toprol-XL, and as needed nitroglycerin.  He has been following serial chest CTs through [facility] with history of pulmonary nodules that have been stable.  Last scan in February 2020 also mentioned multivessel coronary calcification which is consistent with his history of CAD and also trace bilateral pleural/pericardial effusions.  I reviewed his most recent lab work from Dr. Nevada [name].  Past Medical History:  Diagnosis Date  . Anxiety   . Arthritis   . Cataract   . Chronic idiopathic thrombocytopenia (HCC)   . Chronic renal insufficiency    Single kidney  . Coronary atherosclerosis of native coronary vessel    DES RCA 2007  . Depression   . Essential hypertension   . GERD (gastroesophageal reflux disease)   . Hx of colonic polyps 01/01/2015  . Mixed hyperlipidemia   . Myocardial infarction [facility]) 2006-07-05   STEMI   . Peripheral vascular disease [facility])     Past Surgical History:  Procedure  Laterality Date  . CARDIAC CATHETERIZATION  2010   Patent stent and nonobsturctive cad following an abnormal cardiolite study may of 2010 with an apical lateral defect  . CARPAL TUNNEL RELEASE  11/28/2012   Procedure: CARPAL TUNNEL RELEASE;  Surgeon: Jessy Oto, MD;  Location: [place];  Service: Orthopedics;  Laterality: Left;  Left anterior submuscular transposition of ulnar nerve at elbow, left open carpal tunnel release  . COLONOSCOPY    . ELBOW SURGERY Left 2013  . [place]   screws 1962  . HARDWARE REMOVAL  03/20/2012   Procedure: HARDWARE REMOVAL;  Surgeon: Jessy Oto, MD;  Location: [place];  Service: Orthopedics;  Laterality: Left;  Hardware removal 3 knowles pins left hip  . HERNIA REPAIR    . TONSILLECTOMY    . TOTAL HIP ARTHROPLASTY  03/20/2012   Procedure: TOTAL HIP ARTHROPLASTY;  Surgeon: Jessy Oto, MD;  Location: [place];  Service: Orthopedics;  Laterality: Left;  Left total hip replacement, ceramic on poly    Current Outpatient Medications  Medication Sig Dispense Refill  . allopurinol (ZYLOPRIM) 300 MG tablet Take 300 mg by mouth daily.      Marland Kitchen aspirin EC 81 MG tablet Take 81 mg by mouth daily.    Marland Kitchen atorvastatin (LIPITOR) 20 MG tablet Take 20 mg by mouth daily.    . diazepam (VALIUM) 2 MG tablet Take 2 mg by mouth 2 (two) times a day.     Marland Kitchen  furosemide (LASIX) 80 MG tablet Take 40 mg by mouth daily.     . hydrALAZINE (APRESOLINE) 25 MG tablet TAKE 1 TABLET BY MOUTH THREE TIMES DAILY 180 tablet 3  . metoprolol succinate (TOPROL-XL) 50 MG 24 hr tablet Take 25 mg by mouth daily.    . nitroGLYCERIN (NITROSTAT) 0.4 MG SL tablet Place 1 tablet (0.4 mg total) under the tongue every 5 (five) minutes x 3 doses as needed (if no relief after 3rd dose, proceed to the ED for an evaluation). For chest pain. If no relief after 3 rd dose, proceed to the ED for an evaluation. 25 tablet 3  . Omega-3 Fatty Acids (FISH OIL MAXIMUM STRENGTH) 1200 MG CAPS Take 2 capsules by  mouth daily.     Marland Kitchen omeprazole (PRILOSEC) 20 MG capsule Take 20 mg by mouth daily.       No current facility-administered medications for this visit.   Allergies:  Acetaminophen and Ibuprofen   Social History: The patient  reports that he quit smoking about 15 years ago. His smoking use included cigarettes. He started smoking about 59 years ago. He has a 126.00 pack-year smoking history. He has never used smokeless tobacco. He reports that he does not drink alcohol or use drugs.   ROS:  Please see the history of present illness. Otherwise, complete review of systems is positive for hearing loss.  All other systems are reviewed and negative.   Physical Exam: VS:  BP (!) 158/82   Pulse 82   Ht 5\' 10"  (1.778 m)   Wt 207 lb (93.9 kg)   SpO2 96%   BMI 29.70 kg/m , BMI Body mass index is 29.7 kg/m.  Wt Readings from Last 3 Encounters:  01/14/20 207 lb (93.9 kg)  05/28/19 203 lb 12.8 oz (92.4 kg)  02/13/19 190 lb 14.7 oz (86.6 kg)    General: Elderly male, appears comfortable at rest. HEENT: Conjunctiva and lids normal, wearing a mask. Neck: Supple, no elevated JVP, bilateral carotid bruits, no thyromegaly. Lungs: Clear to auscultation, nonlabored breathing at rest. Cardiac: Regular rate and rhythm, no S3 or significant systolic murmur, no pericardial rub. Abdomen: Soft, nontender, bowel sounds present. Extremities: Trace ankle edema, distal pulses 2+. Skin: Warm and dry. Musculoskeletal: No kyphosis. Neuropsychiatric: Alert and oriented x3, affect grossly appropriate.  ECG:  An ECG dated 11/29/2018 was personally reviewed today and demonstrated:  Sinus rhythm with nonspecific ST changes and ventricular bigeminy.  Recent Labwork: 03/05/2019: Creatinine, Ser 1.90  August 2020: Hemoglobin 12.7, platelets 111, BUN 17, creatinine 1.94, potassium 4.2, AST 20, ALT 20, triglycerides 109, HDL 31, LDL 62, hemoglobin A1c 5.9%  Other Studies Reviewed Today:  Lexiscan Cardiolite 11/04/2015:   There was no ST segment deviation noted during stress.  Findings consistent with prior inferior/inferolateral myocardial infarction with moderate peri-infarct ischemia.  This is an intermediate risk study.  The left ventricular ejection fraction is normal (55-65%).  Carotid Dopplers 02/13/2019: Summary:  Right Carotid: Evidence consistent with a total occlusion of the right  ICA.   Left Carotid: Velocities in the left ICA are consistent with a 80-99%  stenosis.        The ECA appears >50% stenosed. End diastolic velocities may  be        affected by an irregular cardiac rythm.   Vertebrals: Left vertebral artery demonstrates antegrade flow with a  blunted        upstroke. Right vertebral artery demonstrates retrograde  flow.  Subclavians: Bilateral subclavian artery flow  was disturbed.   Assessment and Plan:  1.  CAD status post DES to the RCA in 2007 with residual disease that has been managed medically.  He has evidence of multivessel coronary calcifications by CT imaging, but no clearly progressive angina symptoms on medical therapy.  Last ischemic testing was in 2016.  ECG today shows normal sinus rhythm with nonspecific ST-T changes.  Continue aspirin, Toprol-XL, and Lipitor.  2.  Mixed hyperlipidemia, he continues on Lipitor.  Most recent LDL was 62.  3.  Tiny pulmonary nodules, followed by [facility] with serial chest CT imaging.  He is due for a follow-up scan.  We gave him the number for [facility] for follow-up.  4.  Bilateral carotid artery disease as outlined above.  Continue aspirin and statin.  Follow-up made with Dr. Carlis Abbott with [facility] for continued surveillance.  5.  CKD stage IIIb, last creatinine was 1.94.  6.  History of frequent PVCs, currently quiescent.  Continues on beta-blocker.  Medication Adjustments/Labs and Tests Ordered: Current medicines are reviewed at length with the patient today.  Concerns regarding medicines  are outlined above.   Tests Ordered: Orders Placed This Encounter  Procedures  . EKG 12-Lead    Medication Changes: No orders of the defined types were placed in this encounter.   Disposition:  Follow up 6 months in the [place] office.  Signed, Satira Sark, MD, [facility] 01/14/2020 2:52 PM    [place] [facility] [place], [place], [place] [address] Phone: [phone]; Fax: [phone]

## 2020-01-14 NOTE — Patient Instructions (Addendum)
Medication Instructions:  Continue all current medications.  Labwork: none  Testing/Procedures: none  Follow-Up: Your physician wants you to follow up in: 6 months.  You will receive a reminder letter in the mail one-two months in advance.  If you don't receive a letter, please call our office to schedule the follow up appointment   Any Other Special Instructions Will Be Listed Below (If Applicable).  Phone number for [facility]:  [phone]  Dr. Carlis Abbott - Vein & Vascular - needs follow up - overdue - see appointment made today.   If you need a refill on your cardiac medications before your next appointment, please call your pharmacy.

## 2020-01-25 DIAGNOSIS — I6523 Occlusion and stenosis of bilateral carotid arteries: Secondary | ICD-10-CM | POA: Diagnosis not present

## 2020-01-25 DIAGNOSIS — M25512 Pain in left shoulder: Secondary | ICD-10-CM | POA: Diagnosis not present

## 2020-01-25 DIAGNOSIS — D696 Thrombocytopenia, unspecified: Secondary | ICD-10-CM | POA: Diagnosis not present

## 2020-01-25 DIAGNOSIS — N183 Chronic kidney disease, stage 3 unspecified: Secondary | ICD-10-CM | POA: Diagnosis not present

## 2020-01-25 DIAGNOSIS — M542 Cervicalgia: Secondary | ICD-10-CM | POA: Diagnosis not present

## 2020-01-31 DIAGNOSIS — I10 Essential (primary) hypertension: Secondary | ICD-10-CM | POA: Diagnosis not present

## 2020-01-31 DIAGNOSIS — N1831 Chronic kidney disease, stage 3a: Secondary | ICD-10-CM | POA: Diagnosis not present

## 2020-01-31 DIAGNOSIS — R944 Abnormal results of kidney function studies: Secondary | ICD-10-CM | POA: Diagnosis not present

## 2020-01-31 DIAGNOSIS — I2510 Atherosclerotic heart disease of native coronary artery without angina pectoris: Secondary | ICD-10-CM | POA: Diagnosis not present

## 2020-01-31 DIAGNOSIS — I5032 Chronic diastolic (congestive) heart failure: Secondary | ICD-10-CM | POA: Diagnosis not present

## 2020-02-18 ENCOUNTER — Telehealth (HOSPITAL_COMMUNITY): Payer: Self-pay

## 2020-02-18 NOTE — Telephone Encounter (Signed)

## 2020-02-19 ENCOUNTER — Ambulatory Visit: Payer: Medicare Other | Admitting: Vascular Surgery

## 2020-02-19 ENCOUNTER — Other Ambulatory Visit: Payer: Self-pay

## 2020-02-19 ENCOUNTER — Ambulatory Visit (HOSPITAL_COMMUNITY)
Admission: RE | Admit: 2020-02-19 | Discharge: 2020-02-19 | Disposition: A | Discharge status: Home / Self Care | Payer: Medicare Other | Source: Ambulatory Visit | Attending: Vascular Surgery | Admitting: Vascular Surgery

## 2020-02-19 ENCOUNTER — Encounter: Payer: Self-pay | Admitting: Vascular Surgery

## 2020-02-19 VITALS — BP 170/85 | HR 59 | Temp 98.1°F | Resp 16 | Ht 70.0 in | Wt 201.0 lb

## 2020-02-19 DIAGNOSIS — I6523 Occlusion and stenosis of bilateral carotid arteries: Secondary | ICD-10-CM | POA: Insufficient documentation

## 2020-02-19 NOTE — Progress Notes (Signed)
Patient name: Derek [name] MRN: [id number] DOB: [birth year] Sex: male  REASON FOR VISIT: 6 month follow-up for carotid surveillance  HPI: Derek [name] is a 74 y.o. male with hx CAD s/p MI 2007, HTN, HLD that presents for 6 month follow-up for carotid surveillance.  He has a known right ICA occlusion.  We were previously following his moderate left ICA disease and he had progression of velocities on duplex last year and a CTA was obtained to plan a TCAR last year in March 2020.  Ultimately this showed only 50 to 60% stenosis with a fairly tortuous left ICA.  We elected to postpone surgery given current guidelines to repair only more than 80% stenosis in asymptomatic disease.  On follow-up today reports no new vision loss weakness numbness or other stroke symptoms.  He has only other concern is left lower extremity swelling that started several weeks ago.  No history of other blood clots in his legs.  Past Medical History:  Diagnosis Date  . Anxiety   . Arthritis   . Cataract   . Chronic idiopathic thrombocytopenia (HCC)   . Chronic renal insufficiency    Single kidney  . Coronary atherosclerosis of native coronary vessel    DES RCA 2007  . Depression   . Essential hypertension   . GERD (gastroesophageal reflux disease)   . Hx of colonic polyps 01/01/2015  . Mixed hyperlipidemia   . Myocardial infarction [facility]) 2006-07-05   STEMI   . Peripheral vascular disease [facility])     Past Surgical History:  Procedure Laterality Date  . CARDIAC CATHETERIZATION  2010   Patent stent and nonobsturctive cad following an abnormal cardiolite study may of 2010 with an apical lateral defect  . CARPAL TUNNEL RELEASE  11/28/2012   Procedure: CARPAL TUNNEL RELEASE;  Surgeon: Jessy Oto, MD;  Location: [place];  Service: Orthopedics;  Laterality: Left;  Left anterior submuscular transposition of ulnar nerve at elbow, left open carpal tunnel release  . COLONOSCOPY    . ELBOW SURGERY Left 2013  . [place]   screws 1962  . HARDWARE REMOVAL  03/20/2012   Procedure: HARDWARE REMOVAL;  Surgeon: Jessy Oto, MD;  Location: [place];  Service: Orthopedics;  Laterality: Left;  Hardware removal 3 knowles pins left hip  . HERNIA REPAIR    . TONSILLECTOMY    . TOTAL HIP ARTHROPLASTY  03/20/2012   Procedure: TOTAL HIP ARTHROPLASTY;  Surgeon: Jessy Oto, MD;  Location: [place];  Service: Orthopedics;  Laterality: Left;  Left total hip replacement, ceramic on poly    Family History  Problem Relation Age of Onset  . Stroke Other   . Coronary artery disease Other   . Colon cancer Brother        has colostomy   . Esophageal cancer Neg Hx   . Stomach cancer Neg Hx   . Rectal cancer Neg Hx   . Colon polyps Neg Hx     SOCIAL HISTORY: Social History   Tobacco Use  . Smoking status: Former Smoker    Packs/day: 3.00    Years: 42.00    Pack years: 126.00    Types: Cigarettes    Start date: 10/30/1960    Quit date: 07/23/2004    Years since quitting: 15.5  . Smokeless tobacco: Never Used  . Tobacco comment: Counseled to remain smoke free  Substance Use Topics  . Alcohol use: No    Alcohol/week: 0.0 standard  drinks    Allergies  Allergen Reactions  . Acetaminophen Other (See Comments)    One kidney Pt has one Kidney   . Ibuprofen Other (See Comments)    One kidney Pt has one Kidney     Current Outpatient Medications  Medication Sig Dispense Refill  . allopurinol (ZYLOPRIM) 300 MG tablet Take 300 mg by mouth daily.      Marland Kitchen aspirin EC 81 MG tablet Take 81 mg by mouth daily.    Marland Kitchen atorvastatin (LIPITOR) 20 MG tablet Take 20 mg by mouth daily.    . diazepam (VALIUM) 2 MG tablet Take 2 mg by mouth 2 (two) times a day.     . furosemide (LASIX) 80 MG tablet Take 40 mg by mouth daily.     . hydrALAZINE (APRESOLINE) 25 MG tablet TAKE 1 TABLET BY MOUTH THREE TIMES DAILY 180 tablet 3  . metoprolol succinate (TOPROL-XL) 50 MG 24 hr tablet Take 25 mg by mouth daily.    .  nitroGLYCERIN (NITROSTAT) 0.4 MG SL tablet Place 1 tablet (0.4 mg total) under the tongue every 5 (five) minutes x 3 doses as needed (if no relief after 3rd dose, proceed to the ED for an evaluation). For chest pain. If no relief after 3 rd dose, proceed to the ED for an evaluation. 25 tablet 3  . Omega-3 Fatty Acids (FISH OIL MAXIMUM STRENGTH) 1200 MG CAPS Take 2 capsules by mouth daily.     Marland Kitchen omeprazole (PRILOSEC) 20 MG capsule Take 20 mg by mouth daily.       No current facility-administered medications for this visit.    REVIEW OF SYSTEMS:  [X]  denotes positive finding, [ ]  denotes negative finding Cardiac  Comments:  Chest pain or chest pressure:    Shortness of breath upon exertion:    Short of breath when lying flat:    Irregular heart rhythm:        Vascular    Pain in calf, thigh, or hip brought on by ambulation:    Pain in feet at night that wakes you up from your sleep:     Blood clot in your veins:    Leg swelling:     Pain in calf that awakens him at night    Pulmonary    Oxygen at home:    Productive cough:     Wheezing:         Neurologic    Sudden weakness in arms or legs:     Sudden numbness in arms or legs:     Sudden onset of difficulty speaking or slurred speech:    Temporary loss of vision in one eye:     Problems with dizziness:         Gastrointestinal    Blood in stool:     Vomited blood:         Genitourinary    Burning when urinating:     Blood in urine:        Psychiatric    Major depression:         Hematologic    Bleeding problems:    Problems with blood clotting too easily:        Skin    Rashes or ulcers:        Constitutional    Fever or chills:      PHYSICAL EXAM: Vitals:   02/19/20 1424 02/19/20 1427  BP: (!) 169/82 (!) 170/85  Pulse: (!) 57 (!) 59  Resp: 16  Temp: 98.1 F (36.7 C)   TempSrc: Temporal   SpO2: 99%   Weight: 201 lb (91.2 kg)   Height: 5\' 10"  (1.778 m)     GENERAL: The patient is a well-nourished  male, in no acute distress. The vital signs are documented above. CARDIAC: There is a regular rate and rhythm.  VASCULAR:  Palpable femoral pulses Palpable DP pulses bilaterally Significant swelling and edema to left lower extremity PULMONARY: There is good air exchange bilaterally without wheezing or rales. ABDOMEN: Soft and non-tender with normal pitched bowel sounds.  MUSCULOSKELETAL: There are no major deformities or cyanosis. NEUROLOGIC: No focal weakness or paresthesias are detected.   DATA:   Carotid duplex today again shows right ICA occlusion and has had significant progression in his left ICA velocities from 337/116 to 436/187 in the left mid ICA consistent with >80% stenosis.    Assessment/Plan:  74 year old male presents for ongoing follow-up of of his carotid disease.  He has known right ICA occlusion.  His left ICA stenosis appears to progress significantly based on duplex velocity from 337/116 to 436/187 suggesting high grade stenosis..  Given contralateral occlusion I would still favor a TCAR for his intervention.  I have recommended repeat CTA neck again for operative planning since his last CTA was over a year ago and just to confirm that he indeed has an 80% stenosis that would justify an intervention which I think is higher risk in the setting of contralateral occlusion.  Certainly his velocities could be elevated from contralateral occlusion and tortuosity and he would not need any intervention at this time.  I will also get a left lower extremity reflux study when he returns given significant swelling in the left leg and this wants help rule out DVT.  He will see me after CTA neck to discuss if surgery is indicated and I gave him information on TCAR today.  Marty Heck, MD [facility] [place] [facility] Office: [place]

## 2020-02-20 ENCOUNTER — Other Ambulatory Visit: Payer: Self-pay | Admitting: *Deleted

## 2020-02-20 DIAGNOSIS — I739 Peripheral vascular disease, unspecified: Secondary | ICD-10-CM

## 2020-03-04 ENCOUNTER — Other Ambulatory Visit: Payer: Self-pay

## 2020-03-04 DIAGNOSIS — I6523 Occlusion and stenosis of bilateral carotid arteries: Secondary | ICD-10-CM

## 2020-03-18 ENCOUNTER — Ambulatory Visit (HOSPITAL_COMMUNITY): Payer: Medicare Other

## 2020-03-20 ENCOUNTER — Other Ambulatory Visit: Payer: Self-pay

## 2020-03-20 ENCOUNTER — Ambulatory Visit (HOSPITAL_COMMUNITY)
Admission: RE | Admit: 2020-03-20 | Discharge: 2020-03-20 | Disposition: A | Discharge status: Home / Self Care | Payer: Medicare Other | Source: Ambulatory Visit | Attending: Vascular Surgery | Admitting: Vascular Surgery

## 2020-03-20 DIAGNOSIS — I6523 Occlusion and stenosis of bilateral carotid arteries: Secondary | ICD-10-CM | POA: Diagnosis not present

## 2020-03-20 LAB — POCT I-STAT CREATININE: Creatinine, Ser: 1.8 mg/dL — ABNORMAL HIGH (ref 0.61–1.24)

## 2020-03-20 MED ORDER — IOHEXOL 350 MG/ML SOLN
75.0000 mL | Freq: Once | INTRAVENOUS | Status: AC | PRN
  Administered 2020-03-20: 60 mL via INTRAVENOUS

## 2020-03-25 ENCOUNTER — Ambulatory Visit: Payer: Medicare Other | Admitting: Vascular Surgery

## 2020-03-25 ENCOUNTER — Encounter: Payer: Self-pay | Admitting: Vascular Surgery

## 2020-03-25 ENCOUNTER — Other Ambulatory Visit: Payer: Self-pay

## 2020-03-25 ENCOUNTER — Ambulatory Visit (HOSPITAL_COMMUNITY)
Admission: RE | Admit: 2020-03-25 | Discharge: 2020-03-25 | Disposition: A | Discharge status: Home / Self Care | Payer: Medicare Other | Source: Ambulatory Visit | Attending: Vascular Surgery | Admitting: Vascular Surgery

## 2020-03-25 VITALS — BP 165/88 | HR 78 | Temp 97.7°F | Resp 18 | Ht 70.0 in | Wt 198.0 lb

## 2020-03-25 DIAGNOSIS — I739 Peripheral vascular disease, unspecified: Secondary | ICD-10-CM | POA: Diagnosis not present

## 2020-03-25 DIAGNOSIS — I6523 Occlusion and stenosis of bilateral carotid arteries: Secondary | ICD-10-CM | POA: Diagnosis not present

## 2020-03-25 NOTE — Progress Notes (Signed)
Patient name: Derek [name] MRN: [id number] DOB: 12/02/1946 Sex: male  REASON FOR VISIT: Follow-up after CTA neck to discuss carotid intervention  HPI: SAEED TOREN is a 74 y.o. male with hx CAD s/p MI 2007, HTN, HLD that presents for follow-up to discuss CTA.  He has a known right ICA occlusion.  We were previously following his moderate left ICA disease and he had progression of velocities on duplex last year and a CTA was obtained to plan a TCAR last year in March 2020.  Ultimately this showed only 50 to 60% stenosis with a fairly tortuous left ICA.  We elected to postpone surgery given current guidelines.  When seen recently had further progression of left ICA velocities on [place].  He still has had no history of TIA or stroke.  We sent him back for a CTA and he presents for follow-up.  Past Medical History:  Diagnosis Date  . Anxiety   . Arthritis   . Cataract   . Chronic idiopathic thrombocytopenia (HCC)   . Chronic renal insufficiency    Single kidney  . Coronary atherosclerosis of native coronary vessel    DES RCA 2007  . Depression   . Essential hypertension   . GERD (gastroesophageal reflux disease)   . Hx of colonic polyps 01/01/2015  . Mixed hyperlipidemia   . Myocardial infarction [facility]) 2006-07-05   STEMI   . Peripheral vascular disease [facility])     Past Surgical History:  Procedure Laterality Date  . CARDIAC CATHETERIZATION  2010   Patent stent and nonobsturctive cad following an abnormal cardiolite study may of 2010 with an apical lateral defect  . CARPAL TUNNEL RELEASE  11/28/2012   Procedure: CARPAL TUNNEL RELEASE;  Surgeon: Jessy Oto, MD;  Location: [place];  Service: Orthopedics;  Laterality: Left;  Left anterior submuscular transposition of ulnar nerve at elbow, left open carpal tunnel release  . COLONOSCOPY    . ELBOW SURGERY Left 2013  . [employer]   screws 1962  . HARDWARE REMOVAL  03/20/2012   Procedure: HARDWARE REMOVAL;  Surgeon: Jessy Oto, MD;  Location: [place];  Service: Orthopedics;  Laterality: Left;  Hardware removal 3 knowles pins left hip  . HERNIA REPAIR    . TONSILLECTOMY    . TOTAL HIP ARTHROPLASTY  03/20/2012   Procedure: TOTAL HIP ARTHROPLASTY;  Surgeon: Jessy Oto, MD;  Location: [place];  Service: Orthopedics;  Laterality: Left;  Left total hip replacement, ceramic on poly    Family History  Problem Relation Age of Onset  . Stroke Other   . Coronary artery disease Other   . Colon cancer Brother        has colostomy   . Esophageal cancer Neg Hx   . Stomach cancer Neg Hx   . Rectal cancer Neg Hx   . Colon polyps Neg Hx     SOCIAL HISTORY: Social History   Tobacco Use  . Smoking status: Former Smoker    Packs/day: 3.00    Years: 42.00    Pack years: 126.00    Types: Cigarettes    Start date: 10/30/1960    Quit date: 07/23/2004    Years since quitting: 15.6  . Smokeless tobacco: Never Used  . Tobacco comment: Counseled to remain smoke free  Substance Use Topics  . Alcohol use: No    Alcohol/week: 0.0 standard drinks    Allergies  Allergen Reactions  . Acetaminophen Other (See Comments)  One kidney Pt has one Kidney   . Ibuprofen Other (See Comments)    One kidney Pt has one Kidney     Current Outpatient Medications  Medication Sig Dispense Refill  . allopurinol (ZYLOPRIM) 300 MG tablet Take 300 mg by mouth daily.      Marland Kitchen aspirin EC 81 MG tablet Take 81 mg by mouth daily.    Marland Kitchen atorvastatin (LIPITOR) 20 MG tablet Take 20 mg by mouth daily.    . diazepam (VALIUM) 2 MG tablet Take 2 mg by mouth 2 (two) times a day.     . furosemide (LASIX) 80 MG tablet Take 40 mg by mouth daily.     . hydrALAZINE (APRESOLINE) 25 MG tablet TAKE 1 TABLET BY MOUTH THREE TIMES DAILY 180 tablet 3  . metoprolol succinate (TOPROL-XL) 50 MG 24 hr tablet Take 25 mg by mouth daily.    . nitroGLYCERIN (NITROSTAT) 0.4 MG SL tablet Place 1 tablet (0.4 mg total) under the tongue every 5 (five) minutes x 3 doses as  needed (if no relief after 3rd dose, proceed to the ED for an evaluation). For chest pain. If no relief after 3 rd dose, proceed to the ED for an evaluation. 25 tablet 3  . Omega-3 Fatty Acids (FISH OIL MAXIMUM STRENGTH) 1200 MG CAPS Take 2 capsules by mouth daily.     Marland Kitchen omeprazole (PRILOSEC) 20 MG capsule Take 20 mg by mouth daily.       No current facility-administered medications for this visit.    REVIEW OF SYSTEMS:  [X]  denotes positive finding, [ ]  denotes negative finding Cardiac  Comments:  Chest pain or chest pressure:    Shortness of breath upon exertion:    Short of breath when lying flat:    Irregular heart rhythm:        Vascular    Pain in calf, thigh, or hip brought on by ambulation:    Pain in feet at night that wakes you up from your sleep:     Blood clot in your veins:    Leg swelling:     Pain in calf that awakens him at night    Pulmonary    Oxygen at home:    Productive cough:     Wheezing:         Neurologic    Sudden weakness in arms or legs:     Sudden numbness in arms or legs:     Sudden onset of difficulty speaking or slurred speech:    Temporary loss of vision in one eye:     Problems with dizziness:         Gastrointestinal    Blood in stool:     Vomited blood:         Genitourinary    Burning when urinating:     Blood in urine:        Psychiatric    Major depression:         Hematologic    Bleeding problems:    Problems with blood clotting too easily:        Skin    Rashes or ulcers:        Constitutional    Fever or chills:      PHYSICAL EXAM: Vitals:   03/25/20 1517 03/25/20 1522  BP: (!) 163/86 (!) 165/88  Pulse: 78 78  Resp: 18   Temp: 97.7 F (36.5 C)   TempSrc: Temporal   SpO2: 97%   Weight: 198  lb (89.8 kg)   Height: 5\' 10"  (1.778 m)     GENERAL: The patient is a well-nourished male, in no acute distress. The vital signs are documented above. CARDIAC: There is a regular rate and rhythm.  VASCULAR:  Palpable  femoral pulses Palpable DP pulses bilaterally Significant swelling and edema to left lower extremity, no active ulcerations PULMONARY: There is good air exchange bilaterally without wheezing or rales. ABDOMEN: Soft and non-tender with normal pitched bowel sounds.  MUSCULOSKELETAL: There are no major deformities or cyanosis. NEUROLOGIC: No focal weakness or paresthesias are detected.   DATA:   Carotid duplexshows right ICA occlusion and has had significant progression in his left ICA velocities from 337/116 to 436/187 in the left mid ICA consistent with >80% stenosis.   CTA neck on my review shows known R ICA occlusion and tortuous left ICA stenosis >70%.  Assessment/Plan:  74 year old male presents for ongoing follow-up of of his carotid disease.  He has known right ICA occlusion.  His left ICA stenosis appears to have progressed significantly based on duplex velocity from 337/116 to 436/187 suggesting high grade stenosis.. Subsequently sent for CTA neck again for further evaluation.  On my review looking at the coronal images he certainly appears to have greater than 70% near high-grade stenosis.  [place] certainly suggests >57%.  Given contralateral occlusion and strong family history of multiple stroke events in his family he wants to proceed with intervention which I think is very reasonable.  We discussed the benefits of stroke risk reduction from carotid intervention.  I previously thought about a TCAR but given a fairly tortuous ICA in diseased segment I think a carotid intervention would be most appropriate.  I have some concerns about landing a stent in tortuous segment.  Risk and benefits were discussed in detail of left carotid endarterectomy.   Marty Heck, MD [facility] [place] Office: [place]

## 2020-03-26 ENCOUNTER — Other Ambulatory Visit: Payer: Self-pay

## 2020-04-02 NOTE — Progress Notes (Signed)
[place], [place] - [place] [id number] [place] [address] Phone: [phone] Fax: [phone]      Your procedure is scheduled on Monday, April 26th.  Report to [facility] Main Entrance "A" at 9:30 A.M., and check in at the Admitting office.  Call this number if you have problems the morning of surgery:  [phone]  Call [phone] if you have any questions prior to your surgery date Monday-Friday 8am-4pm    Remember:  Do not eat or drink after midnight the night before your surgery     Take these medicines the morning of surgery with A SIP OF WATER   Allopurinol (Zyloprim)  Atorvastatin (Lipitor)  Valium  Metoprolol  Nitroglycerin - if needed  Omeprazole (Prilosec)  Follow your surgeon's instructions on when to stop Aspirin.  If no instructions were given by your surgeon then you will need to call the office to get those instructions.    As of today, STOP taking any Aspirin containing products, Aleve, Naproxen, Ibuprofen, Motrin, Advil, Goody's, BC's, all herbal medications, fish oil, and all vitamins.                      Do not wear jewelry            Do not wear lotions, powders, colognes, or deodorant.            Men may shave face and neck.            Do not bring valuables to the hospital.            [facility] is not responsible for any belongings or valuables.  Do NOT Smoke (Tobacco/Vapping) or drink Alcohol 24 hours prior to your procedure If you use a CPAP at night, you may bring all equipment for your overnight stay.   Contacts, glasses, dentures or bridgework may not be worn into surgery.      For patients admitted to the hospital, discharge time will be determined by your treatment team.   Patients discharged the day of surgery will not be allowed to drive home, and someone needs to stay with them for 24 hours.    Special instructions:   [place]- Preparing For Surgery  Before surgery, you can  play an important role. Because skin is not sterile, your skin needs to be as free of germs as possible. You can reduce the number of germs on your skin by washing with CHG (chlorahexidine gluconate) Soap before surgery.  CHG is an antiseptic cleaner which kills germs and bonds with the skin to continue killing germs even after washing.    Oral Hygiene is also important to reduce your risk of infection.  Remember - BRUSH YOUR TEETH THE MORNING OF SURGERY WITH YOUR REGULAR TOOTHPASTE  Please do not use if you have an allergy to CHG or antibacterial soaps. If your skin becomes reddened/irritated stop using the CHG.  Do not shave (including legs and underarms) for at least 48 hours prior to first CHG shower. It is OK to shave your face.  Please follow these instructions carefully.   1. Shower the NIGHT BEFORE SURGERY and the MORNING OF SURGERY with CHG Soap.   2. If you chose to wash your hair, wash your hair first as usual with your normal shampoo.  3. After you shampoo, rinse your hair and body thoroughly to remove the shampoo.  4. Use CHG as you would any  other liquid soap. You can apply CHG directly to the skin and wash gently with a scrungie or a clean washcloth.   5. Apply the CHG Soap to your body ONLY FROM THE NECK DOWN.  Do not use on open wounds or open sores. Avoid contact with your eyes, ears, mouth and genitals (private parts). Wash Face and genitals (private parts)  with your normal soap.   6. Wash thoroughly, paying special attention to the area where your surgery will be performed.  7. Thoroughly rinse your body with warm water from the neck down.  8. DO NOT shower/wash with your normal soap after using and rinsing off the CHG Soap.  9. Pat yourself dry with a CLEAN TOWEL.  10. Wear CLEAN PAJAMAS to bed the night before surgery, wear comfortable clothes the morning of surgery  11. Place CLEAN SHEETS on your bed the night of your first shower and DO NOT SLEEP WITH  PETS.   Day of Surgery:   Do not apply any deodorants/lotions.  Please wear clean clothes to the hospital/surgery center.   Remember to brush your teeth WITH YOUR REGULAR TOOTHPASTE.   Please read over the following fact sheets that you were given.

## 2020-04-03 ENCOUNTER — Inpatient Hospital Stay (HOSPITAL_COMMUNITY)
Admission: RE | Admit: 2020-04-03 | Discharge: 2020-04-03 | Disposition: A | Discharge status: Home / Self Care | Payer: Medicare Other | Source: Ambulatory Visit

## 2020-04-03 NOTE — Progress Notes (Signed)
[place], [place] [id number] [place] [address] Phone: [phone] Fax: [phone]      Your procedure is scheduled on Monday, April 26th.  Report to [facility] Main Entrance "A" at 9:30 A.M., and check in at the Admitting office.  Call this number if you have problems the morning of surgery:  [phone]  Call [phone] if you have any questions prior to your surgery date Monday-Friday 8am-4pm    Remember:  Do not eat or drink after midnight the night before your surgery     Take these medicines the morning of surgery with A SIP OF WATER   Allopurinol (Zyloprim)  Atorvastatin (Lipitor)  Valium  hydrALAZINE (APRESOLINE)  Metoprolol  Omeprazole (Prilosec)  Nitroglycerin - if needed   Follow your surgeon's instructions on when to stop Aspirin.  If no instructions were given by your surgeon then you will need to call the office to get those instructions.    As of today, STOP taking any Aspirin containing products, Aleve, Naproxen, Ibuprofen, Motrin, Advil, Goody's, BC's, all herbal medications, fish oil, and all vitamins.                      Do not wear jewelry            Do not wear lotions, powders, colognes, or deodorant.            Men may shave face and neck.            Do not bring valuables to the hospital.            [facility] is not responsible for any belongings or valuables.  Do NOT Smoke (Tobacco/Vapping) or drink Alcohol 24 hours prior to your procedure If you use a CPAP at night, you may bring all equipment for your overnight stay.   Contacts, glasses, dentures or bridgework may not be worn into surgery.      For patients admitted to the hospital, discharge time will be determined by your treatment team.   Patients discharged the day of surgery will not be allowed to drive home, and someone needs to stay with them for 24 hours.    Special instructions:   [place]- Preparing For  Surgery  Before surgery, you can play an important role. Because skin is not sterile, your skin needs to be as free of germs as possible. You can reduce the number of germs on your skin by washing with CHG (chlorahexidine gluconate) Soap before surgery.  CHG is an antiseptic cleaner which kills germs and bonds with the skin to continue killing germs even after washing.    Oral Hygiene is also important to reduce your risk of infection.  Remember - BRUSH YOUR TEETH THE MORNING OF SURGERY WITH YOUR REGULAR TOOTHPASTE  Please do not use if you have an allergy to CHG or antibacterial soaps. If your skin becomes reddened/irritated stop using the CHG.  Do not shave (including legs and underarms) for at least 48 hours prior to first CHG shower. It is OK to shave your face.  Please follow these instructions carefully.   1. Shower the NIGHT BEFORE SURGERY and the MORNING OF SURGERY with CHG Soap.   2. If you chose to wash your hair, wash your hair first as usual with your normal shampoo.  3. After you shampoo, rinse your hair and body thoroughly to remove the shampoo.  4. Use CHG  as you would any other liquid soap. You can apply CHG directly to the skin and wash gently with a scrungie or a clean washcloth.   5. Apply the CHG Soap to your body ONLY FROM THE NECK DOWN.  Do not use on open wounds or open sores. Avoid contact with your eyes, ears, mouth and genitals (private parts). Wash Face and genitals (private parts)  with your normal soap.   6. Wash thoroughly, paying special attention to the area where your surgery will be performed.  7. Thoroughly rinse your body with warm water from the neck down.  8. DO NOT shower/wash with your normal soap after using and rinsing off the CHG Soap.  9. Pat yourself dry with a CLEAN TOWEL.  10. Wear CLEAN PAJAMAS to bed the night before surgery, wear comfortable clothes the morning of surgery  11. Place CLEAN SHEETS on your bed the night of your first  shower and DO NOT SLEEP WITH PETS.   Day of Surgery:   Do not apply any deodorants/lotions.  Please wear clean clothes to the hospital/surgery center.   Remember to brush your teeth WITH YOUR REGULAR TOOTHPASTE.   Please read over the following fact sheets that you were given.

## 2020-04-04 ENCOUNTER — Other Ambulatory Visit (HOSPITAL_COMMUNITY): Admission: RE | Admit: 2020-04-04 | Payer: Medicare Other | Source: Ambulatory Visit

## 2020-04-04 ENCOUNTER — Encounter (HOSPITAL_COMMUNITY)
Admission: RE | Admit: 2020-04-04 | Discharge: 2020-04-04 | Disposition: A | Discharge status: Home / Self Care | Payer: Medicare Other | Source: Ambulatory Visit | Attending: Vascular Surgery | Admitting: Vascular Surgery

## 2020-04-04 ENCOUNTER — Other Ambulatory Visit (HOSPITAL_COMMUNITY)
Admission: RE | Admit: 2020-04-04 | Discharge: 2020-04-04 | Disposition: A | Discharge status: Home / Self Care | Payer: Medicare Other | Source: Ambulatory Visit | Attending: Vascular Surgery | Admitting: Vascular Surgery

## 2020-04-04 ENCOUNTER — Other Ambulatory Visit: Payer: Self-pay

## 2020-04-04 ENCOUNTER — Encounter (HOSPITAL_COMMUNITY): Payer: Self-pay

## 2020-04-04 DIAGNOSIS — Z7982 Long term (current) use of aspirin: Secondary | ICD-10-CM | POA: Insufficient documentation

## 2020-04-04 DIAGNOSIS — Z79899 Other long term (current) drug therapy: Secondary | ICD-10-CM | POA: Insufficient documentation

## 2020-04-04 DIAGNOSIS — I2510 Atherosclerotic heart disease of native coronary artery without angina pectoris: Secondary | ICD-10-CM | POA: Insufficient documentation

## 2020-04-04 DIAGNOSIS — Z955 Presence of coronary angioplasty implant and graft: Secondary | ICD-10-CM | POA: Insufficient documentation

## 2020-04-04 DIAGNOSIS — Z87891 Personal history of nicotine dependence: Secondary | ICD-10-CM | POA: Insufficient documentation

## 2020-04-04 DIAGNOSIS — Z01818 Encounter for other preprocedural examination: Secondary | ICD-10-CM | POA: Insufficient documentation

## 2020-04-04 DIAGNOSIS — I252 Old myocardial infarction: Secondary | ICD-10-CM | POA: Insufficient documentation

## 2020-04-04 DIAGNOSIS — I129 Hypertensive chronic kidney disease with stage 1 through stage 4 chronic kidney disease, or unspecified chronic kidney disease: Secondary | ICD-10-CM | POA: Insufficient documentation

## 2020-04-04 DIAGNOSIS — I6523 Occlusion and stenosis of bilateral carotid arteries: Secondary | ICD-10-CM | POA: Insufficient documentation

## 2020-04-04 DIAGNOSIS — N189 Chronic kidney disease, unspecified: Secondary | ICD-10-CM | POA: Insufficient documentation

## 2020-04-04 DIAGNOSIS — E782 Mixed hyperlipidemia: Secondary | ICD-10-CM | POA: Diagnosis not present

## 2020-04-04 DIAGNOSIS — E785 Hyperlipidemia, unspecified: Secondary | ICD-10-CM | POA: Insufficient documentation

## 2020-04-04 DIAGNOSIS — Z86010 Personal history of colonic polyps: Secondary | ICD-10-CM | POA: Insufficient documentation

## 2020-04-04 DIAGNOSIS — Z20822 Contact with and (suspected) exposure to covid-19: Secondary | ICD-10-CM | POA: Insufficient documentation

## 2020-04-04 DIAGNOSIS — K219 Gastro-esophageal reflux disease without esophagitis: Secondary | ICD-10-CM | POA: Insufficient documentation

## 2020-04-04 DIAGNOSIS — Z886 Allergy status to analgesic agent status: Secondary | ICD-10-CM | POA: Diagnosis not present

## 2020-04-04 DIAGNOSIS — D693 Immune thrombocytopenic purpura: Secondary | ICD-10-CM | POA: Insufficient documentation

## 2020-04-04 DIAGNOSIS — Z96642 Presence of left artificial hip joint: Secondary | ICD-10-CM | POA: Insufficient documentation

## 2020-04-04 DIAGNOSIS — Q600 Renal agenesis, unilateral: Secondary | ICD-10-CM | POA: Diagnosis not present

## 2020-04-04 DIAGNOSIS — I739 Peripheral vascular disease, unspecified: Secondary | ICD-10-CM | POA: Insufficient documentation

## 2020-04-04 DIAGNOSIS — N183 Chronic kidney disease, stage 3 unspecified: Secondary | ICD-10-CM | POA: Diagnosis not present

## 2020-04-04 HISTORY — DX: Disorder of arteries and arterioles, unspecified: I77.9

## 2020-04-04 HISTORY — DX: Cardiac arrhythmia, unspecified: I49.9

## 2020-04-04 LAB — CBC
HCT: 43.8 % (ref 39.0–52.0)
Hemoglobin: 13.4 g/dL (ref 13.0–17.0)
MCH: 25.6 pg — ABNORMAL LOW (ref 26.0–34.0)
MCHC: 30.6 g/dL (ref 30.0–36.0)
MCV: 83.6 fL (ref 80.0–100.0)
Platelets: 101 10*3/uL — ABNORMAL LOW (ref 150–400)
RBC: 5.24 MIL/uL (ref 4.22–5.81)
RDW: 16.5 % — ABNORMAL HIGH (ref 11.5–15.5)
WBC: 5 10*3/uL (ref 4.0–10.5)
nRBC: 0 % (ref 0.0–0.2)

## 2020-04-04 LAB — URINALYSIS, ROUTINE W REFLEX MICROSCOPIC
Bacteria, UA: NONE SEEN
Bilirubin Urine: NEGATIVE
Glucose, UA: NEGATIVE mg/dL
Hgb urine dipstick: NEGATIVE
Ketones, ur: NEGATIVE mg/dL
Leukocytes,Ua: NEGATIVE
Nitrite: NEGATIVE
Protein, ur: 30 mg/dL — AB
Specific Gravity, Urine: 1.008 (ref 1.005–1.030)
pH: 6 (ref 5.0–8.0)

## 2020-04-04 LAB — COMPREHENSIVE METABOLIC PANEL
ALT: 24 U/L (ref 0–44)
AST: 31 U/L (ref 15–41)
Albumin: 3.4 g/dL — ABNORMAL LOW (ref 3.5–5.0)
Alkaline Phosphatase: 80 U/L (ref 38–126)
Anion gap: 9 (ref 5–15)
BUN: 17 mg/dL (ref 8–23)
CO2: 26 mmol/L (ref 22–32)
Calcium: 9.3 mg/dL (ref 8.9–10.3)
Chloride: 104 mmol/L (ref 98–111)
Creatinine, Ser: 1.78 mg/dL — ABNORMAL HIGH (ref 0.61–1.24)
GFR calc Af Amer: 43 mL/min — ABNORMAL LOW (ref >60)
GFR calc non Af Amer: 37 mL/min — ABNORMAL LOW (ref >60)
Glucose, Bld: 133 mg/dL — ABNORMAL HIGH (ref 70–99)
Potassium: 3.9 mmol/L (ref 3.5–5.1)
Sodium: 139 mmol/L (ref 135–145)
Total Bilirubin: 1 mg/dL (ref 0.3–1.2)
Total Protein: 7.1 g/dL (ref 6.5–8.1)

## 2020-04-04 LAB — SURGICAL PCR SCREEN
MRSA, PCR: NEGATIVE
Staphylococcus aureus: NEGATIVE

## 2020-04-04 LAB — TYPE AND SCREEN
ABO/RH(D): B POS
Antibody Screen: NEGATIVE

## 2020-04-04 LAB — APTT: aPTT: 36 seconds (ref 24–36)

## 2020-04-04 LAB — PROTIME-INR
INR: 1.2 (ref 0.8–1.2)
Prothrombin Time: 14.6 seconds (ref 11.4–15.2)

## 2020-04-04 NOTE — Progress Notes (Signed)
Anesthesia Chart Review:  Case: [id number] Date/Time: 04/07/20 1115   Procedure: ENDARTERECTOMY CAROTID (Left )   Anesthesia type: General   Pre-op diagnosis: CAROTID ARTERY STENOSIS   Location: [facility] OR ROOM 12 / [place] [facility]   Surgeons: Marty Heck, MD      DISCUSSION: Patient is a 74 year old [name] scheduled for the above procedure.  History includes former smoker (quit 07/23/04), CAD (MI s/p DES mid-distal RCA 07/05/06), carotid artery disease (LICA stenosis, RICA occlusion 02/2020), CKD, chronic idiopathic thrombocytopenia, PVD/intermittent claudication, HLD, GERD. S/p Left THA 03/20/12.   Last evaluation with cardiologist Dr. Domenic Polite on 01/14/20. "CAD status post DES to the RCA in 2007 with residual disease that has been managed medically.  He has evidence of multivessel coronary calcifications by CT imaging, but no clearly progressive angina symptoms on medical therapy.  Last ischemic testing was in 2016.  ECG today shows normal sinus rhythm with nonspecific ST-T changes.  Continue aspirin, Toprol-XL, and Lipitor." Also on b-blocker for known frequent PVCs. He was overdue for carotid stenosis follow-up, so follow-up appointment made with Dr. Carlis Abbott. He also gave patient number for [facility] to schedule follow-up for "tiny pulmonary nodules" on 01/2019 lung cancer screening CT.   Cr 1.78, but known CKD and consistent with results dating back to at least 12/2017. PLT count 101K and with known history of thrombocytopenia (90-131K since 03/2012).  04/04/20 COVID-19 test has not yet resulted.  Anesthesia team to evaluate on the day of surgery.   VS: BP (!) 172/82   Pulse 82   Temp 36.9 C (Oral)   Resp 18   Ht 5\' 10"  (1.778 m)   Wt 91.9 kg   SpO2 96%   BMI 29.08 kg/m     PROVIDERS: Celene Squibb, MD is PCP  [name], MD is cardiologist   LABS: Labs reviewed: Acceptable for surgery. See DISCUSSION. (all labs ordered are listed, but only abnormal results are displayed)  Labs  Reviewed  CBC - Abnormal; Notable for the following components:      Result Value   MCH 25.6 (*)    RDW 16.5 (*)    Platelets 101 (*)    All other components within normal limits  COMPREHENSIVE METABOLIC PANEL - Abnormal; Notable for the following components:   Glucose, Bld 133 (*)    Creatinine, Ser 1.78 (*)    Albumin 3.4 (*)    GFR calc non Af Amer 37 (*)    GFR calc Af Amer 43 (*)    All other components within normal limits  URINALYSIS, ROUTINE W REFLEX MICROSCOPIC - Abnormal; Notable for the following components:   Protein, ur 30 (*)    All other components within normal limits  SURGICAL PCR SCREEN  APTT  PROTIME-INR  TYPE AND SCREEN   Lab Results  Component Value Date   CREATININE 1.78 (H) 04/04/2020   CREATININE 1.80 (H) 03/20/2020   CREATININE 1.90 (H) 03/05/2019    IMAGES: CTA neck 03/20/20: IMPRESSION: - Increased stenosis at the left ICA origin, now measuring 60% (previously 50%). - Chronic right ICA occlusion. - Chronic presumed high-grade stenosis at the right vertebral origin with reconstitution distally. - Stable appearance of localized dissection or irregular plaque at the innominate artery origin.  CT Chest (lung cancer screen) 2/19/Derek: IMPRESSION:  - No significant growth of the previously visualized scattered pulmonary nodules. New tiny peripheral right lower lobe pulmonary nodule measuring 3.2 mm in volume derived mean diameter. 1. Lung-RADS 2, benign appearance  or behavior. Continue annual screening with low-dose chest CT without contrast in 12 months. 2. Three-vessel coronary atherosclerosis. 3. New trace dependent bilateral pleural effusions and trace pericardial effusion. Aortic Atherosclerosis (ICD10-I70.0) and Emphysema (ICD10-J43.9).   EKG: EKG 01/14/20 ([facility]): NSR, nonspecific ST and T wave abnormality   CV: Carotid US 02/19/20: Summary:  - Right Carotid: Evidence consistent with a total occlusion of the right ICA.  Non-hemodynamically significant plaque <50% noted in the CCA. The ECA appears <50% stenosed.  - Left Carotid: Velocities in the left ICA are consistent with a 80-99% stenosis.  Non-hemodynamically significant plaque <50% noted in the CCA. The ECA appears >50% stenosed. Right ICA stenosis grade based on velocity criteria. Velocities may be falsely elevated due to tortuosity and compensatory flow.  - Vertebrals: Left vertebral artery demonstrates antegrade flow. Right vertebral artery demonstrates retrograde flow.  - Subclavians: Bilateral subclavian artery flow was disturbed.   Nuclear stress test 11/04/15:  There was no ST segment deviation noted during stress.  Findings consistent with prior inferior/inferolateral myocardial infarction with moderate peri-infarct ischemia.  This is an intermediate risk study.  The left ventricular ejection fraction is normal (55-65%). - Reviewed by Dr. Domenic Polite who wrote, "Please let him know that the study shows an area of scar and decreased blood flow within the inferior/inferolateral wall. This could be consistent with a change in his previous RCA stent, or even represent increased blockage within the circumflex artery which was previously only 50%. If he is symptomatically stable on present medical therapy, we can follow him closely for now. If on the other hand he is having any progressive exertional symptoms, we can always discuss proceeding with a cardiac catheterization to further clarify."   Echo 10/27/11 (limited bedside ECHO by Salli Real, MD):  "Normal LV function ejection fraction 55%. No wall motion abnormalities."    Cardiac cath 06/13/09 (done after an abnormal Myoview showing apical and lateral ischemia): 1. Patent right coronary artery stent.  2. Mild nonobstructive coronary artery disease as outlined above. (Left main and DIAG no significant stenosis, minimal plaque proximal LAD, LCx with 2 main OM branches with small OM2 with 50%  stenosis, proximal to mid RCA stent there was 40% stenosis, mild nonobstructive plaque in the distal RCA, PDA and PL branches without any high-grade stenoses.) 3. Elevated left ventricular end-diastolic pressure. (Aortic pressure is 169/75 with a mean of 111, left  ventricular pressure 173/25.) Continued medical therapy was recommended.    Past Medical History:  Diagnosis Date  . Anxiety   . Arthritis   . Carotid artery disease ([place])   . Cataract   . Chronic idiopathic thrombocytopenia ([facility])   . Chronic renal insufficiency    Single kidney  . Coronary atherosclerosis of native coronary vessel    DES RCA 2007  . Depression   . Dysrhythmia   . Essential hypertension   . GERD (gastroesophageal reflux disease)   . Hx of colonic polyps 1/Derek/2016  . Mixed hyperlipidemia   . Myocardial infarction [facility]) 2006-07-05   STEMI   . Peripheral vascular disease [facility])     Past Surgical History:  Procedure Laterality Date  . CARDIAC CATHETERIZATION  2010   Patent stent and nonobsturctive cad following an abnormal cardiolite study may of 2010 with an apical lateral defect  . CARPAL TUNNEL RELEASE  11/28/2012   Procedure: CARPAL TUNNEL RELEASE;  Surgeon: Jessy Oto, MD;  Location: [place];  Service: Orthopedics;  Laterality: Left;  Left anterior submuscular transposition of ulnar  nerve at elbow, left open carpal tunnel release  . COLONOSCOPY    . ELBOW SURGERY Left 2013  . EYE SURGERY    . [place]   screws 1962  . FRACTURE SURGERY    . HARDWARE REMOVAL  03/20/2012   Procedure: HARDWARE REMOVAL;  Surgeon: Jessy Oto, MD;  Location: [place];  Service: Orthopedics;  Laterality: Left;  Hardware removal 3 knowles pins left hip  . HERNIA REPAIR    . JOINT REPLACEMENT    . TONSILLECTOMY    . TOTAL HIP ARTHROPLASTY  03/20/2012   Procedure: TOTAL HIP ARTHROPLASTY;  Surgeon: Jessy Oto, MD;  Location: [place];  Service: Orthopedics;  Laterality: Left;  Left total hip replacement,  ceramic on poly    MEDICATIONS: . allopurinol (ZYLOPRIM) 100 MG tablet  . aspirin EC 81 MG tablet  . atorvastatin (LIPITOR) Derek MG tablet  . diazepam (VALIUM) 2 MG tablet  . furosemide (LASIX) 40 MG tablet  . hydrALAZINE (APRESOLINE) 25 MG tablet  . metoprolol succinate (TOPROL-XL) 25 MG 24 hr tablet  . nitroGLYCERIN (NITROSTAT) 0.4 MG SL tablet  . Omega-3 Fatty Acids (FISH OIL MAXIMUM STRENGTH) 1200 MG CAPS  . omeprazole (PRILOSEC) Derek MG capsule   No current facility-administered medications for this encounter.     Myra Gianotti, PA-C Surgical Short Stay/Anesthesiology [facility] Phone [phone] [facility] Phone [phone] 04/04/2020 4:50 PM

## 2020-04-04 NOTE — Anesthesia Preprocedure Evaluation (Addendum)
Anesthesia Evaluation  Patient identified by MRN, date of birth, ID band Patient awake    Reviewed: Allergy & Precautions, NPO status , Patient's Chart, lab work & pertinent test results, reviewed documented beta blocker date and time   Airway Mallampati: II  TM Distance: >3 FB Neck ROM: Full    Dental  (+) Edentulous Upper, Edentulous Lower   Pulmonary neg pulmonary ROS, former smoker,    Pulmonary exam normal breath sounds clear to auscultation       Cardiovascular hypertension, Pt. on home beta blockers and Pt. on medications + CAD, + Past MI (2007), + Cardiac Stents (2007) and + Peripheral Vascular Disease (known right ICA occlusion)  Normal cardiovascular exam Rhythm:Regular Rate:Normal  TEE 2007 - Overall left ventricular systolic function was normal. Although no diagnostic left ventricular regional wall motion  abnormality was identified, this possibility cannot be completely excluded on the basis of this study.  Carotid Doppler 02/2020 Right Carotid: Evidence consistent with a total occlusion of the right ICA. Non-hemodynamically significant plaque <50% noted in the CCA. The ECA appears <50% stenosed.   Left Carotid: Velocities in the left ICA are consistent with a 80-99% stenosis. Non-hemodynamically significant plaque <50% noted in the CCA. The ECA appears >50% stenosed. Right ICA stenosis grade based on velocity criteria. Velocities may be falsely elevated due totortuosity and compensatory flow.   Vertebrals: Left vertebral artery demonstrates antegrade flow. Right vertebral artery demonstrates retrograde flow.  Subclavians: Bilateral subclavian artery flow was disturbed.   Neuro/Psych PSYCHIATRIC DISORDERS Anxiety Depression negative neurological ROS     GI/Hepatic Neg liver ROS, GERD  Medicated and Controlled,  Endo/Other  negative endocrine ROS  Renal/GU Renal InsufficiencyRenal disease  negative  genitourinary   Musculoskeletal  (+) Arthritis ,   Abdominal   Peds  Hematology negative hematology ROS (+)   Anesthesia Other Findings   Reproductive/Obstetrics                          Anesthesia Physical Anesthesia Plan  ASA: III  Anesthesia Plan: General   Post-op Pain Management:    Induction: Intravenous  PONV Risk Score and Plan: 2 and Midazolam, Dexamethasone and Ondansetron  Airway Management Planned: Oral ETT  Additional Equipment: Arterial line  Intra-op Plan:   Post-operative Plan: Extubation in OR  Informed Consent: I have reviewed the patients History and Physical, chart, labs and discussed the procedure including the risks, benefits and alternatives for the proposed anesthesia with the patient or authorized representative who has indicated his/her understanding and acceptance.     Dental advisory given  Plan Discussed with: CRNA  Anesthesia Plan Comments: (PAT note written 04/04/2020 by Myra Gianotti, PA-C. )      Anesthesia Quick Evaluation

## 2020-04-04 NOTE — Progress Notes (Signed)
PCP - Dr. Allyn Kenner Cardiologist - Dr. [name]  PPM/ICD - Denies  Chest x-ray - N/A EKG - 01/14/20 Stress Test - 11/04/15 ECHO - 2007 Cardiac Cath - 2010  Sleep Study - Denies   Pt denies being diabetic.  Blood Thinner Instructions: N/A Aspirin Instructions: Instructed to continue as prescribed.  ERAS Protcol - No  COVID TEST- 04/04/20   Coronavirus Screening  Have you experienced the following symptoms:  Cough yes/no: No Fever (>100.58F)  yes/no: No Runny nose yes/no: No Sore throat yes/no: No Difficulty breathing/shortness of breath  yes/no: No  Have you or a family member traveled in the last 14 days and where? yes/no: No   If the patient indicates "YES" to the above questions, their PAT will be rescheduled to limit the exposure to others and, the surgeon will be notified. THE PATIENT WILL NEED TO BE ASYMPTOMATIC FOR 14 DAYS.   If the patient is not experiencing any of these symptoms, the PAT nurse will instruct them to NOT bring anyone with them to their appointment since they may have these symptoms or traveled as well.   Please remind your patients and families that hospital visitation restrictions are in effect and the importance of the restrictions.    Anesthesia review: Yes, cardiac hx  Patient denies shortness of breath, fever, cough and chest pain at PAT appointment   All instructions explained to the patient, with a verbal understanding of the material. Patient agrees to go over the instructions while at home for a better understanding. Patient also instructed to self quarantine after being tested for COVID-19. The opportunity to ask questions was provided.

## 2020-04-05 LAB — SARS CORONAVIRUS 2 (TAT 6-24 HRS): SARS Coronavirus 2: NEGATIVE

## 2020-04-07 ENCOUNTER — Inpatient Hospital Stay (HOSPITAL_COMMUNITY)
Admission: RE | Admit: 2020-04-07 | Discharge: 2020-04-08 | DRG: 038 | Disposition: A | Discharge status: Home / Self Care | Payer: Medicare Other | Attending: Vascular Surgery | Admitting: Vascular Surgery

## 2020-04-07 ENCOUNTER — Other Ambulatory Visit: Payer: Self-pay

## 2020-04-07 ENCOUNTER — Encounter (HOSPITAL_COMMUNITY)
Admission: RE | Disposition: A | Discharge status: Home / Self Care | Payer: Self-pay | Source: Home / Self Care | Attending: Vascular Surgery

## 2020-04-07 ENCOUNTER — Inpatient Hospital Stay (HOSPITAL_COMMUNITY): Payer: Medicare Other | Admitting: Anesthesiology

## 2020-04-07 ENCOUNTER — Encounter (HOSPITAL_COMMUNITY): Payer: Self-pay | Admitting: Vascular Surgery

## 2020-04-07 ENCOUNTER — Inpatient Hospital Stay (HOSPITAL_COMMUNITY): Payer: Medicare Other | Admitting: Vascular Surgery

## 2020-04-07 DIAGNOSIS — I6523 Occlusion and stenosis of bilateral carotid arteries: Principal | ICD-10-CM | POA: Diagnosis present

## 2020-04-07 DIAGNOSIS — I739 Peripheral vascular disease, unspecified: Secondary | ICD-10-CM | POA: Diagnosis present

## 2020-04-07 DIAGNOSIS — F419 Anxiety disorder, unspecified: Secondary | ICD-10-CM | POA: Diagnosis present

## 2020-04-07 DIAGNOSIS — N183 Chronic kidney disease, stage 3 unspecified: Secondary | ICD-10-CM | POA: Diagnosis present

## 2020-04-07 DIAGNOSIS — Z886 Allergy status to analgesic agent status: Secondary | ICD-10-CM

## 2020-04-07 DIAGNOSIS — Q600 Renal agenesis, unilateral: Secondary | ICD-10-CM | POA: Diagnosis not present

## 2020-04-07 DIAGNOSIS — E782 Mixed hyperlipidemia: Secondary | ICD-10-CM | POA: Diagnosis present

## 2020-04-07 DIAGNOSIS — Z96642 Presence of left artificial hip joint: Secondary | ICD-10-CM | POA: Diagnosis present

## 2020-04-07 DIAGNOSIS — I2510 Atherosclerotic heart disease of native coronary artery without angina pectoris: Secondary | ICD-10-CM | POA: Diagnosis present

## 2020-04-07 DIAGNOSIS — Z20822 Contact with and (suspected) exposure to covid-19: Secondary | ICD-10-CM | POA: Diagnosis not present

## 2020-04-07 DIAGNOSIS — Z7982 Long term (current) use of aspirin: Secondary | ICD-10-CM

## 2020-04-07 DIAGNOSIS — D693 Immune thrombocytopenic purpura: Secondary | ICD-10-CM | POA: Diagnosis not present

## 2020-04-07 DIAGNOSIS — I129 Hypertensive chronic kidney disease with stage 1 through stage 4 chronic kidney disease, or unspecified chronic kidney disease: Secondary | ICD-10-CM | POA: Diagnosis present

## 2020-04-07 DIAGNOSIS — I779 Disorder of arteries and arterioles, unspecified: Secondary | ICD-10-CM

## 2020-04-07 DIAGNOSIS — Z955 Presence of coronary angioplasty implant and graft: Secondary | ICD-10-CM

## 2020-04-07 DIAGNOSIS — I6522 Occlusion and stenosis of left carotid artery: Secondary | ICD-10-CM | POA: Diagnosis present

## 2020-04-07 DIAGNOSIS — E669 Obesity, unspecified: Secondary | ICD-10-CM | POA: Diagnosis present

## 2020-04-07 DIAGNOSIS — Z6829 Body mass index (BMI) 29.0-29.9, adult: Secondary | ICD-10-CM

## 2020-04-07 DIAGNOSIS — K219 Gastro-esophageal reflux disease without esophagitis: Secondary | ICD-10-CM | POA: Diagnosis present

## 2020-04-07 DIAGNOSIS — Z79899 Other long term (current) drug therapy: Secondary | ICD-10-CM | POA: Diagnosis not present

## 2020-04-07 DIAGNOSIS — I252 Old myocardial infarction: Secondary | ICD-10-CM

## 2020-04-07 DIAGNOSIS — Z87891 Personal history of nicotine dependence: Secondary | ICD-10-CM | POA: Diagnosis not present

## 2020-04-07 HISTORY — PX: ENDARTERECTOMY: SHX5162

## 2020-04-07 HISTORY — PX: PATCH ANGIOPLASTY: SHX6230

## 2020-04-07 LAB — CREATININE, SERUM
Creatinine, Ser: 1.74 mg/dL — ABNORMAL HIGH (ref 0.61–1.24)
GFR calc Af Amer: 44 mL/min — ABNORMAL LOW (ref >60)
GFR calc non Af Amer: 38 mL/min — ABNORMAL LOW (ref >60)

## 2020-04-07 LAB — CBC
HCT: 34.1 % — ABNORMAL LOW (ref 39.0–52.0)
Hemoglobin: 10.8 g/dL — ABNORMAL LOW (ref 13.0–17.0)
MCH: 26.2 pg (ref 26.0–34.0)
MCHC: 31.7 g/dL (ref 30.0–36.0)
MCV: 82.6 fL (ref 80.0–100.0)
Platelets: 87 10*3/uL — ABNORMAL LOW (ref 150–400)
RBC: 4.13 MIL/uL — ABNORMAL LOW (ref 4.22–5.81)
RDW: 16.8 % — ABNORMAL HIGH (ref 11.5–15.5)
WBC: 6 10*3/uL (ref 4.0–10.5)
nRBC: 0 % (ref 0.0–0.2)

## 2020-04-07 LAB — POCT ACTIVATED CLOTTING TIME: Activated Clotting Time: 246 seconds

## 2020-04-07 SURGERY — ENDARTERECTOMY, CAROTID
Anesthesia: General | Laterality: Left

## 2020-04-07 MED ORDER — PANTOPRAZOLE SODIUM 40 MG PO TBEC: 40.0000 mg | DELAYED_RELEASE_TABLET | Freq: Every day | ORAL | Status: DC

## 2020-04-07 MED ORDER — PHENOL 1.4 % MT LIQD: 1.0000 | OROMUCOSAL | Status: DC | PRN

## 2020-04-07 MED ORDER — SENNOSIDES-DOCUSATE SODIUM 8.6-50 MG PO TABS: 1.0000 | ORAL_TABLET | Freq: Every evening | ORAL | Status: DC | PRN

## 2020-04-07 MED ORDER — ACETAMINOPHEN 325 MG PO TABS
325.0000 mg | ORAL_TABLET | ORAL | Status: DC | PRN
  Administered 2020-04-07: 325 mg via ORAL
  Administered 2020-04-08: 650 mg via ORAL
  Filled 2020-04-07: qty 2
  Filled 2020-04-07: qty 1

## 2020-04-07 MED ORDER — FENTANYL CITRATE (PF) 250 MCG/5ML IJ SOLN
INTRAMUSCULAR | Status: DC | PRN
  Administered 2020-04-07: 100 ug via INTRAVENOUS

## 2020-04-07 MED ORDER — AMISULPRIDE (ANTIEMETIC) 5 MG/2ML IV SOLN
INTRAVENOUS | Status: AC
  Filled 2020-04-07: qty 2

## 2020-04-07 MED ORDER — PROPOFOL 10 MG/ML IV BOLUS
INTRAVENOUS | Status: DC | PRN
  Administered 2020-04-07: 70 mg via INTRAVENOUS

## 2020-04-07 MED ORDER — LACTATED RINGERS IV SOLN: INTRAVENOUS | Status: DC

## 2020-04-07 MED ORDER — SODIUM CHLORIDE 0.9 % IV SOLN: INTRAVENOUS | Status: DC

## 2020-04-07 MED ORDER — DEXAMETHASONE SODIUM PHOSPHATE 10 MG/ML IJ SOLN
INTRAMUSCULAR | Status: AC
  Filled 2020-04-07: qty 1

## 2020-04-07 MED ORDER — LIDOCAINE 2% (20 MG/ML) 5 ML SYRINGE
INTRAMUSCULAR | Status: AC
  Filled 2020-04-07: qty 10

## 2020-04-07 MED ORDER — HYDRALAZINE HCL 20 MG/ML IJ SOLN: 5.0000 mg | INTRAMUSCULAR | Status: DC | PRN

## 2020-04-07 MED ORDER — SODIUM CHLORIDE 0.9 % IV SOLN
0.0125 ug/kg/min | INTRAVENOUS | Status: AC
  Administered 2020-04-07: 12:00:00 .1 ug/kg/min via INTRAVENOUS
  Filled 2020-04-07: qty 2000

## 2020-04-07 MED ORDER — FENTANYL CITRATE (PF) 100 MCG/2ML IJ SOLN
25.0000 ug | INTRAMUSCULAR | Status: DC | PRN
  Administered 2020-04-07 (×2): 50 ug via INTRAVENOUS

## 2020-04-07 MED ORDER — DOCUSATE SODIUM 100 MG PO CAPS: 100.0000 mg | ORAL_CAPSULE | Freq: Every day | ORAL | Status: DC

## 2020-04-07 MED ORDER — ATORVASTATIN CALCIUM 10 MG PO TABS: 20.0000 mg | ORAL_TABLET | Freq: Every day | ORAL | Status: DC

## 2020-04-07 MED ORDER — DIAZEPAM 2 MG PO TABS: 2.0000 mg | ORAL_TABLET | Freq: Two times a day (BID) | ORAL | Status: DC | PRN

## 2020-04-07 MED ORDER — ONDANSETRON HCL 4 MG/2ML IJ SOLN
INTRAMUSCULAR | Status: DC | PRN
  Administered 2020-04-07: 4 mg via INTRAVENOUS

## 2020-04-07 MED ORDER — SUGAMMADEX SODIUM 200 MG/2ML IV SOLN
INTRAVENOUS | Status: DC | PRN
  Administered 2020-04-07: 200 mg via INTRAVENOUS

## 2020-04-07 MED ORDER — CHLORHEXIDINE GLUCONATE 4 % EX LIQD: 60.0000 mL | Freq: Once | CUTANEOUS | Status: DC

## 2020-04-07 MED ORDER — PROMETHAZINE HCL 25 MG/ML IJ SOLN
12.5000 mg | Freq: Once | INTRAMUSCULAR | Status: AC
  Administered 2020-04-07: 12.5 mg via INTRAVENOUS
  Filled 2020-04-07: qty 1

## 2020-04-07 MED ORDER — SODIUM CHLORIDE 0.9 % IV SOLN: 500.0000 mL | Freq: Once | INTRAVENOUS | Status: DC | PRN

## 2020-04-07 MED ORDER — ACETAMINOPHEN 325 MG RE SUPP: 325.0000 mg | RECTAL | Status: DC | PRN

## 2020-04-07 MED ORDER — PROPOFOL 10 MG/ML IV BOLUS
INTRAVENOUS | Status: AC
  Filled 2020-04-07: qty 20

## 2020-04-07 MED ORDER — LIDOCAINE 2% (20 MG/ML) 5 ML SYRINGE
INTRAMUSCULAR | Status: AC
  Filled 2020-04-07: qty 5

## 2020-04-07 MED ORDER — GLYCOPYRROLATE PF 0.2 MG/ML IJ SOSY
PREFILLED_SYRINGE | INTRAMUSCULAR | Status: AC
  Filled 2020-04-07: qty 1

## 2020-04-07 MED ORDER — HEPARIN SODIUM (PORCINE) 5000 UNIT/ML IJ SOLN
5000.0000 [IU] | Freq: Three times a day (TID) | INTRAMUSCULAR | Status: DC
  Administered 2020-04-07 – 2020-04-08 (×2): 5000 [IU] via SUBCUTANEOUS
  Filled 2020-04-07 (×2): qty 1

## 2020-04-07 MED ORDER — ALLOPURINOL 100 MG PO TABS
200.0000 mg | ORAL_TABLET | Freq: Two times a day (BID) | ORAL | Status: DC
  Administered 2020-04-07: 200 mg via ORAL
  Filled 2020-04-07: qty 2

## 2020-04-07 MED ORDER — OXYCODONE HCL 5 MG PO TABS: 5.0000 mg | ORAL_TABLET | ORAL | Status: DC | PRN

## 2020-04-07 MED ORDER — HEPARIN SODIUM (PORCINE) 1000 UNIT/ML IJ SOLN
INTRAMUSCULAR | Status: AC
  Filled 2020-04-07: qty 1

## 2020-04-07 MED ORDER — SODIUM CHLORIDE 0.9 % IV SOLN
INTRAVENOUS | Status: AC
  Filled 2020-04-07: qty 1.2

## 2020-04-07 MED ORDER — LIDOCAINE 2% (20 MG/ML) 5 ML SYRINGE
INTRAMUSCULAR | Status: DC | PRN
  Administered 2020-04-07: 60 mg via INTRAVENOUS

## 2020-04-07 MED ORDER — HEMOSTATIC AGENTS (NO CHARGE) OPTIME
TOPICAL | Status: DC | PRN
  Administered 2020-04-07: 2 via TOPICAL

## 2020-04-07 MED ORDER — MORPHINE SULFATE (PF) 2 MG/ML IV SOLN: 2.0000 mg | INTRAVENOUS | Status: DC | PRN

## 2020-04-07 MED ORDER — HEPARIN SODIUM (PORCINE) 1000 UNIT/ML IJ SOLN
INTRAMUSCULAR | Status: DC | PRN
  Administered 2020-04-07: 1000 [IU] via INTRAVENOUS
  Administered 2020-04-07: 9000 [IU] via INTRAVENOUS

## 2020-04-07 MED ORDER — GLYCOPYRROLATE 0.2 MG/ML IJ SOLN
INTRAMUSCULAR | Status: DC | PRN
Start: 2020-04-07 — End: 2020-04-07
  Administered 2020-04-07: .2 mg via INTRAVENOUS

## 2020-04-07 MED ORDER — LABETALOL HCL 5 MG/ML IV SOLN: 10.0000 mg | INTRAVENOUS | Status: DC | PRN

## 2020-04-07 MED ORDER — FUROSEMIDE 40 MG PO TABS
40.0000 mg | ORAL_TABLET | Freq: Every day | ORAL | Status: DC
  Filled 2020-04-07: qty 1

## 2020-04-07 MED ORDER — METOPROLOL SUCCINATE ER 25 MG PO TB24: 25.0000 mg | ORAL_TABLET | Freq: Every day | ORAL | Status: DC

## 2020-04-07 MED ORDER — GUAIFENESIN-DM 100-10 MG/5ML PO SYRP: 15.0000 mL | ORAL_SOLUTION | ORAL | Status: DC | PRN

## 2020-04-07 MED ORDER — FENTANYL CITRATE (PF) 250 MCG/5ML IJ SOLN
INTRAMUSCULAR | Status: AC
  Filled 2020-04-07: qty 5

## 2020-04-07 MED ORDER — PHENYLEPHRINE 40 MCG/ML (10ML) SYRINGE FOR IV PUSH (FOR BLOOD PRESSURE SUPPORT)
PREFILLED_SYRINGE | INTRAVENOUS | Status: AC
  Filled 2020-04-07: qty 20

## 2020-04-07 MED ORDER — PHENYLEPHRINE HCL-NACL 10-0.9 MG/250ML-% IV SOLN
INTRAVENOUS | Status: DC | PRN
Start: 2020-04-07 — End: 2020-04-07
  Administered 2020-04-07: 25 ug/min via INTRAVENOUS

## 2020-04-07 MED ORDER — AMISULPRIDE (ANTIEMETIC) 5 MG/2ML IV SOLN
5.0000 mg | Freq: Once | INTRAVENOUS | Status: AC
  Administered 2020-04-07: 5 mg via INTRAVENOUS

## 2020-04-07 MED ORDER — SODIUM CHLORIDE 0.9 % IV SOLN
INTRAVENOUS | Status: DC | PRN
  Administered 2020-04-07: 500 mL

## 2020-04-07 MED ORDER — ONDANSETRON HCL 4 MG/2ML IJ SOLN
4.0000 mg | Freq: Four times a day (QID) | INTRAMUSCULAR | Status: DC | PRN
  Administered 2020-04-07: 4 mg via INTRAVENOUS
  Filled 2020-04-07: qty 2

## 2020-04-07 MED ORDER — LIDOCAINE HCL (PF) 1 % IJ SOLN
INTRAMUSCULAR | Status: AC
  Filled 2020-04-07: qty 5

## 2020-04-07 MED ORDER — PROTAMINE SULFATE 10 MG/ML IV SOLN
INTRAVENOUS | Status: AC
  Filled 2020-04-07: qty 5

## 2020-04-07 MED ORDER — 0.9 % SODIUM CHLORIDE (POUR BTL) OPTIME
TOPICAL | Status: DC | PRN
  Administered 2020-04-07: 2000 mL

## 2020-04-07 MED ORDER — NITROGLYCERIN 0.4 MG SL SUBL: 0.4000 mg | SUBLINGUAL_TABLET | SUBLINGUAL | Status: DC | PRN

## 2020-04-07 MED ORDER — ALUM & MAG HYDROXIDE-SIMETH 200-200-20 MG/5ML PO SUSP: 15.0000 mL | ORAL | Status: DC | PRN

## 2020-04-07 MED ORDER — ASPIRIN EC 81 MG PO TBEC: 81.0000 mg | DELAYED_RELEASE_TABLET | Freq: Every day | ORAL | Status: DC

## 2020-04-07 MED ORDER — PROTAMINE SULFATE 10 MG/ML IV SOLN
INTRAVENOUS | Status: DC | PRN
  Administered 2020-04-07: 40 mg via INTRAVENOUS
  Administered 2020-04-07: 10 mg via INTRAVENOUS

## 2020-04-07 MED ORDER — CEFAZOLIN SODIUM-DEXTROSE 2-4 GM/100ML-% IV SOLN
2.0000 g | Freq: Three times a day (TID) | INTRAVENOUS | Status: AC
  Administered 2020-04-07 – 2020-04-08 (×2): 2 g via INTRAVENOUS
  Filled 2020-04-07 (×2): qty 100

## 2020-04-07 MED ORDER — ROCURONIUM BROMIDE 10 MG/ML (PF) SYRINGE
PREFILLED_SYRINGE | INTRAVENOUS | Status: AC
  Filled 2020-04-07: qty 10

## 2020-04-07 MED ORDER — HYDRALAZINE HCL 25 MG PO TABS
25.0000 mg | ORAL_TABLET | Freq: Three times a day (TID) | ORAL | Status: DC
  Administered 2020-04-07: 25 mg via ORAL
  Filled 2020-04-07: qty 1

## 2020-04-07 MED ORDER — METOPROLOL TARTRATE 5 MG/5ML IV SOLN: 2.0000 mg | INTRAVENOUS | Status: DC | PRN

## 2020-04-07 MED ORDER — MAGNESIUM SULFATE 2 GM/50ML IV SOLN: 2.0000 g | Freq: Every day | INTRAVENOUS | Status: DC | PRN

## 2020-04-07 MED ORDER — BISACODYL 5 MG PO TBEC: 5.0000 mg | DELAYED_RELEASE_TABLET | Freq: Every day | ORAL | Status: DC | PRN

## 2020-04-07 MED ORDER — FENTANYL CITRATE (PF) 100 MCG/2ML IJ SOLN
INTRAMUSCULAR | Status: AC
  Filled 2020-04-07: qty 2

## 2020-04-07 MED ORDER — DIPHENHYDRAMINE HCL 50 MG/ML IJ SOLN
INTRAMUSCULAR | Status: AC
  Filled 2020-04-07: qty 1

## 2020-04-07 MED ORDER — ROCURONIUM BROMIDE 10 MG/ML (PF) SYRINGE
PREFILLED_SYRINGE | INTRAVENOUS | Status: DC | PRN
  Administered 2020-04-07: 100 mg via INTRAVENOUS

## 2020-04-07 MED ORDER — POTASSIUM CHLORIDE CRYS ER 20 MEQ PO TBCR: 20.0000 meq | EXTENDED_RELEASE_TABLET | Freq: Every day | ORAL | Status: DC | PRN

## 2020-04-07 MED ORDER — LIDOCAINE HCL (PF) 1 % IJ SOLN
INTRAMUSCULAR | Status: DC | PRN
  Administered 2020-04-07: 3 mL

## 2020-04-07 MED ORDER — CEFAZOLIN SODIUM-DEXTROSE 2-4 GM/100ML-% IV SOLN
2.0000 g | INTRAVENOUS | Status: AC
  Administered 2020-04-07: 12:00:00 2 g via INTRAVENOUS
  Filled 2020-04-07: qty 100

## 2020-04-07 MED ORDER — ONDANSETRON HCL 4 MG/2ML IJ SOLN
INTRAMUSCULAR | Status: AC
  Filled 2020-04-07: qty 2

## 2020-04-07 MED ORDER — DEXAMETHASONE SODIUM PHOSPHATE 10 MG/ML IJ SOLN
INTRAMUSCULAR | Status: DC | PRN
  Administered 2020-04-07: 10 mg via INTRAVENOUS

## 2020-04-07 SURGICAL SUPPLY — 64 items
ADH SKN CLS APL DERMABOND .7 (GAUZE/BANDAGES/DRESSINGS) ×1
ADPR TBG 2 MALE LL ART (MISCELLANEOUS)
CANISTER SUCT 3000ML PPV (MISCELLANEOUS) ×3 IMPLANT
CATH ROBINSON RED A/P 18FR (CATHETERS) ×3 IMPLANT
CLIP VESOCCLUDE MED 24/CT (CLIP) ×3 IMPLANT
CLIP VESOCCLUDE SM WIDE 24/CT (CLIP) ×3 IMPLANT
COVER PROBE W GEL 5X96 (DRAPES) ×2 IMPLANT
COVER TRANSDUCER ULTRASND GEL (DRAPE) ×1 IMPLANT
COVER WAND RF STERILE (DRAPES) ×1 IMPLANT
DERMABOND ADVANCED (GAUZE/BANDAGES/DRESSINGS) ×2
DERMABOND ADVANCED .7 DNX12 (GAUZE/BANDAGES/DRESSINGS) ×1 IMPLANT
DRAIN CHANNEL 15F RND FF W/TCR (WOUND CARE) IMPLANT
ELECT CAUTERY BLADE 6.4 (BLADE) ×2 IMPLANT
ELECT REM PT RETURN 9FT ADLT (ELECTROSURGICAL) ×3
ELECTRODE REM PT RTRN 9FT ADLT (ELECTROSURGICAL) ×1 IMPLANT
EVACUATOR SILICONE 100CC (DRAIN) IMPLANT
GLOVE BIO SURGEON STRL SZ 6.5 (GLOVE) ×2 IMPLANT
GLOVE BIO SURGEON STRL SZ7.5 (GLOVE) ×3 IMPLANT
GLOVE BIO SURGEONS STRL SZ 6.5 (GLOVE) ×2
GLOVE BIOGEL PI IND STRL 6.5 (GLOVE) IMPLANT
GLOVE BIOGEL PI IND STRL 7.0 (GLOVE) IMPLANT
GLOVE BIOGEL PI IND STRL 8 (GLOVE) ×1 IMPLANT
GLOVE BIOGEL PI INDICATOR 6.5 (GLOVE) ×4
GLOVE BIOGEL PI INDICATOR 7.0 (GLOVE) ×4
GLOVE BIOGEL PI INDICATOR 8 (GLOVE) ×2
GLOVE ECLIPSE 6.5 STRL STRAW (GLOVE) ×2 IMPLANT
GLOVE ECLIPSE 7.5 STRL STRAW (GLOVE) ×2 IMPLANT
GOWN STRL REUS W/ TWL LRG LVL3 (GOWN DISPOSABLE) ×2 IMPLANT
GOWN STRL REUS W/ TWL XL LVL3 (GOWN DISPOSABLE) ×2 IMPLANT
GOWN STRL REUS W/TWL LRG LVL3 (GOWN DISPOSABLE) ×6
GOWN STRL REUS W/TWL XL LVL3 (GOWN DISPOSABLE) ×6
HEMOSTAT SNOW SURGICEL 2X4 (HEMOSTASIS) ×4 IMPLANT
IV ADAPTER SYR DOUBLE MALE LL (MISCELLANEOUS) IMPLANT
KIT BASIN OR (CUSTOM PROCEDURE TRAY) ×3 IMPLANT
KIT SHUNT ARGYLE CAROTID ART 6 (VASCULAR PRODUCTS) IMPLANT
KIT TURNOVER KIT B (KITS) ×3 IMPLANT
LOOP VESSEL MINI RED (MISCELLANEOUS) IMPLANT
NDL HYPO 25GX1X1/2 BEV (NEEDLE) IMPLANT
NDL SPNL 20GX3.5 QUINCKE YW (NEEDLE) IMPLANT
NEEDLE HYPO 25GX1X1/2 BEV (NEEDLE) IMPLANT
NEEDLE SPNL 20GX3.5 QUINCKE YW (NEEDLE) IMPLANT
NS IRRIG 1000ML POUR BTL (IV SOLUTION) ×9 IMPLANT
PACK CAROTID (CUSTOM PROCEDURE TRAY) ×3 IMPLANT
PAD ARMBOARD 7.5X6 YLW CONV (MISCELLANEOUS) ×6 IMPLANT
PATCH VASC XENOSURE 1CMX6CM (Vascular Products) ×3 IMPLANT
PATCH VASC XENOSURE 1X6 (Vascular Products) IMPLANT
PENCIL BUTTON HOLSTER BLD 10FT (ELECTRODE) ×2 IMPLANT
POSITIONER HEAD DONUT 9IN (MISCELLANEOUS) ×3 IMPLANT
SHUNT CAROTID BYPASS 10 (VASCULAR PRODUCTS) ×2 IMPLANT
SHUNT CAROTID BYPASS 12FRX15.5 (VASCULAR PRODUCTS) IMPLANT
SPONGE SURGIFOAM ABS GEL 100 (HEMOSTASIS) IMPLANT
STOPCOCK 4 WAY LG BORE MALE ST (IV SETS) IMPLANT
SUT ETHILON 3 0 PS 1 (SUTURE) IMPLANT
SUT MNCRL AB 4-0 PS2 18 (SUTURE) ×3 IMPLANT
SUT PROLENE 5 0 C 1 24 (SUTURE) ×7 IMPLANT
SUT PROLENE 6 0 BV (SUTURE) ×13 IMPLANT
SUT SILK 3 0 (SUTURE)
SUT SILK 3-0 18XBRD TIE 12 (SUTURE) IMPLANT
SUT VIC AB 3-0 SH 27 (SUTURE) ×3
SUT VIC AB 3-0 SH 27X BRD (SUTURE) ×1 IMPLANT
SYR CONTROL 10ML LL (SYRINGE) IMPLANT
TOWEL GREEN STERILE (TOWEL DISPOSABLE) ×3 IMPLANT
TUBING ART PRESS 48 MALE/FEM (TUBING) IMPLANT
WATER STERILE IRR 1000ML POUR (IV SOLUTION) ×3 IMPLANT

## 2020-04-07 NOTE — Anesthesia Procedure Notes (Signed)
Procedure Name: Intubation Date/Time: 04/07/2020 11:48 AM Performed by: Mariea Clonts, CRNA Pre-anesthesia Checklist: Patient identified, Emergency Drugs available, Suction available and Patient being monitored Patient Re-evaluated:Patient Re-evaluated prior to induction Oxygen Delivery Method: Circle System Utilized Preoxygenation: Pre-oxygenation with 100% oxygen Induction Type: IV induction Ventilation: Mask ventilation without difficulty and Oral airway inserted - appropriate to patient size Laryngoscope Size: Sabra Heck and 2 Grade View: Grade I Tube type: Oral Tube size: 7.5 mm Number of attempts: 1 Airway Equipment and Method: Stylet and Oral airway Placement Confirmation: ETT inserted through vocal cords under direct vision,  positive ETCO2 and breath sounds checked- equal and bilateral Tube secured with: Tape Dental Injury: Teeth and Oropharynx as per pre-operative assessment

## 2020-04-07 NOTE — Anesthesia Postprocedure Evaluation (Signed)
Anesthesia Post Note  Patient: Derek [name]  Procedure(s) Performed: LEFT CAROTID ENDARTERECTOMY (Left ) Patch Angioplasty Left Carotid (Left )     Patient location during evaluation: PACU Anesthesia Type: General Level of consciousness: sedated Pain management: pain level controlled Vital Signs Assessment: post-procedure vital signs reviewed and stable Respiratory status: spontaneous breathing and respiratory function stable Cardiovascular status: stable Postop Assessment: no apparent nausea or vomiting Anesthetic complications: no    Last Vitals:  Vitals:   04/07/20 1530 04/07/20 1545  BP: 117/71 139/74  Pulse: 69 69  Resp: 17 14  Temp:    SpO2: 99% 97%    Last Pain:  Vitals:   04/07/20 1522  TempSrc:   PainSc: 4                  Treylan Mcclintock DANIEL

## 2020-04-07 NOTE — Progress Notes (Signed)
  Progress Note    04/07/2020 5:02 PM Day of Surgery  Subjective:  Seen in PACU. Doing well post op. No complaints of pain   Vitals:   04/07/20 1630 04/07/20 1645  BP: 130/68 (!) 135/48  Pulse: 70 66  Resp: 18 12  Temp:    SpO2: 98% 99%   Physical Exam: General: well appearing, well nourished, not in any  Cardiac: regular Lungs: non labored, equal expansion Incisions:  Left neck incision clean,dry and intact without swelling or hematoma Extremities: All extremities well perfused Neurologic: alert and oriented. CN intact. Moving all extremities without weakness. Speech coherent. Tongue midline. Smile symmetrical  CBC    Component Value Date/Time   WBC 5.0 04/04/2020 1000   RBC 5.24 04/04/2020 1000   HGB 13.4 04/04/2020 1000   HGB 12.9 (L) 08/05/2006 1558   HCT 43.8 04/04/2020 1000   HCT 37.7 (L) 08/05/2006 1558   PLT 101 (L) 04/04/2020 1000   PLT 124 (L) 08/05/2006 1558   MCV 83.6 04/04/2020 1000   MCV 81.7 08/05/2006 1558   MCH 25.6 (L) 04/04/2020 1000   MCHC 30.6 04/04/2020 1000   RDW 16.5 (H) 04/04/2020 1000   RDW 14.4 08/05/2006 1558   LYMPHSABS 0.7 03/24/2012 0900   LYMPHSABS 1.1 08/05/2006 1558   MONOABS 0.3 03/24/2012 0900   MONOABS 0.4 08/05/2006 1558   EOSABS 0.1 03/24/2012 0900   EOSABS 0.2 08/05/2006 1558   BASOSABS 0.0 03/24/2012 0900   BASOSABS 0.0 08/05/2006 1558    BMET    Component Value Date/Time   NA 139 04/04/2020 1000   K 3.9 04/04/2020 1000   CL 104 04/04/2020 1000   CO2 26 04/04/2020 1000   GLUCOSE 133 (H) 04/04/2020 1000   BUN 17 04/04/2020 1000   CREATININE 1.78 (H) 04/04/2020 1000   CALCIUM 9.3 04/04/2020 1000   GFRNONAA 37 (L) 04/04/2020 1000   GFRAA 43 (L) 04/04/2020 1000    INR    Component Value Date/Time   INR 1.2 04/04/2020 1000     Intake/Output Summary (Last 24 hours) at 04/07/2020 1702 Last data filed at 04/07/2020 1407 Gross per 24 hour  Intake 1700 ml  Output 100 ml  Net 1600 ml     Assessment/Plan:   74 y.o. male is s/p left carotid endarterectomy Day of Surgery. Left neck incision c/d/i. No new neurological deficits. Pain well controlled. To 4E later this afternoon  DVT prophylaxis:  Sq. heparin   Karoline Caldwell, PA-C [facility] [phone] 04/07/2020 5:02 PM

## 2020-04-07 NOTE — Progress Notes (Signed)
Pt transferred to 4E-19 via bed from PACU. Pt given CHG bath. Tele applied, CCMD notified. Pt oriented to call bell, bed and room. Call bell within reach. Neuro intact. VSS. Amanda Cockayne, RN

## 2020-04-07 NOTE — Anesthesia Procedure Notes (Signed)
Arterial Line Insertion Start/End4/26/2021 9:37 AM, 04/07/2020 9:37 AM Performed by: Renato Shin, CRNA, CRNA  Patient location: Pre-op. Preanesthetic checklist: patient identified, IV checked, site marked, risks and benefits discussed, surgical consent, monitors and equipment checked, pre-op evaluation, timeout performed and anesthesia consent Lidocaine 1% used for infiltration Right, radial was placed Catheter size: 20 G Hand hygiene performed , maximum sterile barriers used  and Seldinger technique used Allen's test indicative of satisfactory collateral circulation Attempts: 1 Procedure performed without using ultrasound guided technique. Following insertion, dressing applied and Biopatch. Post procedure assessment: normal  Patient tolerated the procedure well with no immediate complications.

## 2020-04-07 NOTE — Transfer of Care (Signed)
Immediate Anesthesia Transfer of Care Note  Patient: Derek [name]  Procedure(s) Performed: LEFT CAROTID ENDARTERECTOMY (Left ) Patch Angioplasty Left Carotid (Left )  Patient Location: PACU  Anesthesia Type:General  Level of Consciousness: awake, alert  and oriented  Airway & Oxygen Therapy: Patient Spontanous Breathing  Post-op Assessment: Report given to RN, Post -op Vital signs reviewed and stable and Patient moving all extremities X 4  Post vital signs: Reviewed and stable  Last Vitals:  Vitals Value Taken Time  BP 164/78 04/07/20 1412  Temp 36.4 C 04/07/20 1412  Pulse 68 04/07/20 1420  Resp 18 04/07/20 1420  SpO2 98 % 04/07/20 1420  Vitals shown include unvalidated device data.  Last Pain:  Vitals:   04/07/20 1412  TempSrc:   PainSc: 5       Patients Stated Pain Goal: 4 (14/23/95 3202)  Complications: No apparent anesthesia complications

## 2020-04-07 NOTE — Op Note (Signed)
OPERATIVE NOTE  PROCEDURE:   1.  left carotid endarterectomy with bovine patch angioplasty 2.  left intraoperative carotid ultrasound  PRE-OPERATIVE DIAGNOSIS: left asymptomatic high grade carotid stenosis with ulcerated plaque  POST-OPERATIVE DIAGNOSIS: same as above   SURGEON: Marty Heck, MD  ASSISTANT(S): Paulo Fruit, PA  ANESTHESIA: general  ESTIMATED BLOOD LOSS: <100 cc  FINDING(S): 1.  Continuous Doppler audible flow signatures are  appropriate for each carotid artery after endarterectomy. 2.  No evidence of intimal flap visualized on transverse or longitudinal ultrasonography. 3.  Ulcerated left carotid plaque.  SPECIMEN(S):  Carotid plaque (sent to Pathology)  INDICATIONS:   Derek [name] is a 74 y.o. male who presents with left high grade asymptomatic carotid stenosis.  I discussed the procedural details of carotid endarterectomy with the patient.  The patient is aware that the risks of carotid endarterectomy include but are not limited to: bleeding, infection, stroke, myocardial infarction, death, cranial nerve injuries both temporary and permanent, neck hematoma, possible airway compromise, labile blood pressure post-operatively, cerebral hyperperfusion syndrome, and possible need for additional interventions in the future. The patient is aware of the risks and agrees to proceed forward with the procedure.  DESCRIPTION: After full informed written consent was obtained from the patient, the patient was brought back to the operating room and placed supine upon the operating table.  Prior to induction, the patient received IV antibiotics.  After obtaining adequate anesthesia, the patient was placed into semi-Fowler position with a shoulder roll in place and the patient's neck slightly hyperextended and rotated away from the surgical site.  The patient was prepped in the standard fashion for a left carotid endarterectomy.  I made an incision anterior to the  sternocleidomastoid muscle and dissected down through the subcutaneous tissue.  The platysma was opened with electrocautery.  I then used Bovie cautery and blunt dissection to dissect through the underlying platysma and to mobilize the anterior border of the sternocleidomastoid as well as the internal jugular vein laterally.  The facial vein was ligated with 3-0 silk and surgical clips and divided.  After identifying the carotid artery I used Metzenbaum scissors to bluntly dissect the common carotid artery and then controlled this with both a umbilical tape.  At this point in time the patient was given 100 units/kg of IV heparin and we checked an ACT to ensure it was greater than 250.  I then carried my dissection cephalad and mobilized the external carotid artery and superior thyroid artery and controlled each of these with a vessel loop.  I then dissected out the internal carotid artery well past the distal plaque.  The internal carotid artery was then controlled with a umbilical tape as well. I was careful to identify the vagus nerve between the internal jugular and common carotid and this was presereved.  I was also careful to identify and preserve the hypoglossal nerve and this was preserved.    Once our ACT was confirmed, I proceeded by clamping the internal carotid artery with a bulldog clamp first.  The proximal common carotid artery was controlled with a angled debakey.  The external carotid was controlled with a vessel loop.  I subsequently opened the common carotid artery with an 11 blade scalpel in longitudinal fashion and extended the arteriotomy with Potts scissors onto the ICA past the distal plaque.  I then used a Garment/textile technologist and performed a endarterectomy starting in the common carotid artery.  The external carotid artery was endarterectomized with an  eversion technique and I was careful to feather the distal ICA plaque.  The specimen was passed off the field. At this point a 10 [place]  shunt was brought to the field and then initially placed distally into the ICA after removing the clamp. The shunt was back bleed with good flow.  I then placed the proximal end of the shunt in the common carotid artery and controlled this with a Rummel tourniquet. The endarterectomy site was then flushed with heparinized saline and I was careful to ensure there were no flaps in the endarterectomy site.  I then brought a bovine carotid patch on the field and this was sewn in place with a running anastomosis using a 6-0 Prolene distally and a 5-0 proximal.  The bovine patch was trimmed accordingly.  The shunt was removed just before completion of the patch.  The artery was flushed antegrade and retrograde prior to completion of the patch.  Once the patch was complete, I flushed up the external carotid artery first prior to releasing the internal carotid artery clamp.  Multiple 6-0 Prolene and 5-0 Prolene repair sutures were required in the patch.  An intraoperative ultrasound was performed and no evidence of any flaps.  A doppler was also used with good flow up the ICA and ECA.  Once I was happy with the intraoperative ultrasound the patient was given protamine for reversal.  I used thrombin Gelfoam to get hemostasis around the patch.  Ultimately the platysma was closed in running fashion with 3-0 Vicryl.  The skin was closed with a running 4-0 Monocryl.  Dermabond was applied with a dry sterile dressing.  The patient was awakened from anesthesia with no new neurological deficit and taken to PACU in stable condition.    COMPLICATIONS: None  CONDITION: Stable  Marty Heck, MD [facility]: [phone]  04/07/2020, 1:59 PM

## 2020-04-07 NOTE — H&P (Signed)
History and Physical Interval Note:  04/07/2020 11:03 AM  Derek [name]  has presented today for surgery, with the diagnosis of CAROTID ARTERY STENOSIS.  The various methods of treatment have been discussed with the patient and family. After consideration of risks, benefits and other options for treatment, the patient has consented to  Procedure(s): ENDARTERECTOMY CAROTID (Left) as a surgical intervention.  The patient's history has been reviewed, patient examined, no change in status, stable for surgery.  I have reviewed the patient's chart and labs.  Questions were answered to the patient's satisfaction.    L CEA.  Risks and benefits discussed.  Derek [name]  Patient name: Derek [name] MRN: [id number] DOB: Oct 04, 1946 Sex: male  REASON FOR VISIT: Follow-up after CTA neck to discuss carotid intervention  HPI:  Derek [name] is a 74 y.o. male with hx CAD s/p MI 2007, HTN, HLD that presents for follow-up to discuss CTA. He has a known right ICA occlusion. We were previously following his moderate left ICA disease and he had progression of velocities on duplex last year and a CTA was obtained to plan a TCAR last year in March 2020. Ultimately this showed only 50 to 60% stenosis with a fairly tortuous left ICA. We elected to postpone surgery given current guidelines. When seen recently had further progression of left ICA velocities on [place]. He still has had no history of TIA or stroke. We sent him back for a CTA and he presents for follow-up.      Past Medical History:  Diagnosis Date  . Anxiety   . Arthritis   . Cataract   . Chronic idiopathic thrombocytopenia (HCC)   . Chronic renal insufficiency    Single kidney  . Coronary atherosclerosis of native coronary vessel    DES RCA 2007  . Depression   . Essential hypertension   . GERD (gastroesophageal reflux disease)   . Hx of colonic polyps 01/01/2015  . Mixed hyperlipidemia   . Myocardial infarction [facility]) 2006-07-05   STEMI   .  Peripheral vascular disease [facility])         Past Surgical History:  Procedure Laterality Date  . CARDIAC CATHETERIZATION  2010   Patent stent and nonobsturctive cad following an abnormal cardiolite study may of 2010 with an apical lateral defect  . CARPAL TUNNEL RELEASE  11/28/2012   Procedure: CARPAL TUNNEL RELEASE; Surgeon: Jessy Oto, MD; Location: [place]; Service: Orthopedics; Laterality: Left; Left anterior submuscular transposition of ulnar nerve at elbow, left open carpal tunnel release  . COLONOSCOPY    . ELBOW SURGERY Left 2013  . [place]   screws 1962  . HARDWARE REMOVAL  03/20/2012   Procedure: HARDWARE REMOVAL; Surgeon: Jessy Oto, MD; Location: [place]; Service: Orthopedics; Laterality: Left; Hardware removal 3 knowles pins left hip  . HERNIA REPAIR    . TONSILLECTOMY    . TOTAL HIP ARTHROPLASTY  03/20/2012   Procedure: TOTAL HIP ARTHROPLASTY; Surgeon: Jessy Oto, MD; Location: [place]; Service: Orthopedics; Laterality: Left; Left total hip replacement, ceramic on poly        Family History  Problem Relation Age of Onset  . Stroke Other   . Coronary artery disease Other   . Colon cancer Brother    has colostomy   . Esophageal cancer Neg Hx   . Stomach cancer Neg Hx   . Rectal cancer Neg Hx   . Colon polyps Neg Hx    SOCIAL  HISTORY:  Social History        Tobacco Use  . Smoking status: Former Smoker    Packs/day: 3.00    Years: 42.00    Pack years: 126.00    Types: Cigarettes    Start date: 10/30/1960    Quit date: 07/23/2004    Years since quitting: 15.6  . Smokeless tobacco: Never Used  . Tobacco comment: Counseled to remain smoke free  Substance Use Topics  . Alcohol use: No    Alcohol/week: 0.0 standard drinks        Allergies  Allergen Reactions  . Acetaminophen Other (See Comments)    One kidney  Pt has one Kidney   . Ibuprofen Other (See Comments)    One kidney  Pt has one Kidney          Current Outpatient Medications   Medication Sig Dispense Refill  . allopurinol (ZYLOPRIM) 300 MG tablet Take 300 mg by mouth daily.     Marland Kitchen aspirin EC 81 MG tablet Take 81 mg by mouth daily.    Marland Kitchen atorvastatin (LIPITOR) 20 MG tablet Take 20 mg by mouth daily.    . diazepam (VALIUM) 2 MG tablet Take 2 mg by mouth 2 (two) times a day.     . furosemide (LASIX) 80 MG tablet Take 40 mg by mouth daily.     . hydrALAZINE (APRESOLINE) 25 MG tablet TAKE 1 TABLET BY MOUTH THREE TIMES DAILY 180 tablet 3  . metoprolol succinate (TOPROL-XL) 50 MG 24 hr tablet Take 25 mg by mouth daily.    . nitroGLYCERIN (NITROSTAT) 0.4 MG SL tablet Place 1 tablet (0.4 mg total) under the tongue every 5 (five) minutes x 3 doses as needed (if no relief after 3rd dose, proceed to the ED for an evaluation). For chest pain. If no relief after 3 rd dose, proceed to the ED for an evaluation. 25 tablet 3  . Omega-3 Fatty Acids (FISH OIL MAXIMUM STRENGTH) 1200 MG CAPS Take 2 capsules by mouth daily.     Marland Kitchen omeprazole (PRILOSEC) 20 MG capsule Take 20 mg by mouth daily.      No current facility-administered medications for this visit.   REVIEW OF SYSTEMS:  [X]  denotes positive finding, [ ]  denotes negative finding  Cardiac  Comments:  Chest pain or chest pressure:    Shortness of breath upon exertion:    Short of breath when lying flat:    Irregular heart rhythm:        Vascular    Pain in calf, thigh, or hip brought on by ambulation:    Pain in feet at night that wakes you up from your sleep:     Blood clot in your veins:    Leg swelling:     Pain in calf that awakens him at night    Pulmonary    Oxygen at home:    Productive cough:     Wheezing:         Neurologic    Sudden weakness in arms or legs:     Sudden numbness in arms or legs:     Sudden onset of difficulty speaking or slurred speech:    Temporary loss of vision in one eye:     Problems with dizziness:         Gastrointestinal    Blood in stool:     Vomited blood:         Genitourinary     Burning when urinating:  Blood in urine:        Psychiatric    Major depression:         Hematologic    Bleeding problems:    Problems with blood clotting too easily:        Skin    Rashes or ulcers:        Constitutional    Fever or chills:    PHYSICAL EXAM:      Vitals:   03/25/20 1517 03/25/20 1522  BP: (!) 163/86 (!) 165/88  Pulse: 78 78  Resp: 18   Temp: 97.7 F (36.5 C)   TempSrc: Temporal   SpO2: 97%   Weight: 198 lb (89.8 kg)   Height: 5\' 10"  (1.778 m)    GENERAL: The patient is a well-nourished male, in no acute distress. The vital signs are documented above.  CARDIAC: There is a regular rate and rhythm.  VASCULAR:  Palpable femoral pulses  Palpable DP pulses bilaterally  Significant swelling and edema to left lower extremity, no active ulcerations  PULMONARY: There is good air exchange bilaterally without wheezing or rales.  ABDOMEN: Soft and non-tender with normal pitched bowel sounds.  MUSCULOSKELETAL: There are no major deformities or cyanosis.  NEUROLOGIC: No focal weakness or paresthesias are detected.  DATA:  Carotid duplexshows right ICA occlusion and has had significant progression in his left ICA velocities from 337/116 to 436/187 in the left mid ICA consistent with >80% stenosis.  CTA neck on my review shows known R ICA occlusion and tortuous left ICA stenosis >70%.  Assessment/Plan:  74 year old male presents for ongoing follow-up of of his carotid disease. He has known right ICA occlusion. His left ICA stenosis appears to have progressed significantly based on duplex velocity from 337/116 to 436/187 suggesting high grade stenosis.. Subsequently sent for CTA neck again for further evaluation. On my review looking at the coronal images he certainly appears to have greater than 70% near high-grade stenosis. [place] certainly suggests >13%. Given contralateral occlusion and strong family history of multiple stroke events in his family he wants to proceed  with intervention which I think is very reasonable. We discussed the benefits of stroke risk reduction from carotid intervention. I previously thought about a TCAR but given a fairly tortuous ICA in diseased segment I think a carotid intervention would be most appropriate. I have some concerns about landing a stent in tortuous segment. Risk and benefits were discussed in detail of left carotid endarterectomy.  Derek Heck, MD  [facility] [place]  Office: [phone]

## 2020-04-08 ENCOUNTER — Encounter: Payer: Self-pay | Admitting: *Deleted

## 2020-04-08 LAB — CBC
HCT: 31.4 % — ABNORMAL LOW (ref 39.0–52.0)
Hemoglobin: 9.8 g/dL — ABNORMAL LOW (ref 13.0–17.0)
MCH: 25.8 pg — ABNORMAL LOW (ref 26.0–34.0)
MCHC: 31.2 g/dL (ref 30.0–36.0)
MCV: 82.6 fL (ref 80.0–100.0)
Platelets: 94 10*3/uL — ABNORMAL LOW (ref 150–400)
RBC: 3.8 MIL/uL — ABNORMAL LOW (ref 4.22–5.81)
RDW: 16.6 % — ABNORMAL HIGH (ref 11.5–15.5)
WBC: 6.8 10*3/uL (ref 4.0–10.5)
nRBC: 0 % (ref 0.0–0.2)

## 2020-04-08 LAB — BASIC METABOLIC PANEL
Anion gap: 9 (ref 5–15)
BUN: 22 mg/dL (ref 8–23)
CO2: 21 mmol/L — ABNORMAL LOW (ref 22–32)
Calcium: 8 mg/dL — ABNORMAL LOW (ref 8.9–10.3)
Chloride: 107 mmol/L (ref 98–111)
Creatinine, Ser: 1.84 mg/dL — ABNORMAL HIGH (ref 0.61–1.24)
GFR calc Af Amer: 41 mL/min — ABNORMAL LOW (ref >60)
GFR calc non Af Amer: 35 mL/min — ABNORMAL LOW (ref >60)
Glucose, Bld: 152 mg/dL — ABNORMAL HIGH (ref 70–99)
Potassium: 4 mmol/L (ref 3.5–5.1)
Sodium: 137 mmol/L (ref 135–145)

## 2020-04-08 MED ORDER — OXYCODONE-ACETAMINOPHEN 7.5-325 MG PO TABS: 1.0000 | ORAL_TABLET | ORAL | 0 refills | Status: DC | PRN

## 2020-04-08 NOTE — Progress Notes (Signed)
Pt/family given discharge instructions, medication lists, follow up appointments, and when to call the doctor.  Pt/family verbalizes understanding. Pt given signs and symptoms of infection. Stroke education given with signs and symptoms discussed. All questions answered. Wife Akeel Reffner to transport patient home. Patient transported to main entrance via wheelchair.

## 2020-04-08 NOTE — Progress Notes (Addendum)
  Progress Note    04/08/2020 7:30 AM 1 Day Post-Op  Subjective:  No complaints. Ambulating in room. Already put his clothes on to go home. States he did have some tongue numbness yesterday but this has resolved   Vitals:   04/08/20 0326 04/08/20 0514  BP: 115/66 116/61  Pulse: 85 80  Resp: (!) 21 12  Temp: (!) 97.5 F (36.4 C) 97.6 F (36.4 C)  SpO2: 96% 93%   Physical Exam: General: well appearing, well nourished, not in any distress, sitting up in chair Lungs: non labored Incisions: left neck incision clean, dry and intact. No swelling or hematoma Extremities: Extremities are well perfused. Motor and sensory intact. 5/5 strength bilaterally Abdomen:  Obese, soft, non tender Neurologic: alert and oriented. CN intact. Moving all extremities without weakness. Speech coherent. Tongue midline. Smile symmetrical  CBC    Component Value Date/Time   WBC 6.8 04/08/2020 0325   RBC 3.80 (L) 04/08/2020 0325   HGB 9.8 (L) 04/08/2020 0325   HGB 12.9 (L) 08/05/2006 1558   HCT 31.4 (L) 04/08/2020 0325   HCT 37.7 (L) 08/05/2006 1558   PLT 94 (L) 04/08/2020 0325   PLT 124 (L) 08/05/2006 1558   MCV 82.6 04/08/2020 0325   MCV 81.7 08/05/2006 1558   MCH 25.8 (L) 04/08/2020 0325   MCHC 31.2 04/08/2020 0325   RDW 16.6 (H) 04/08/2020 0325   RDW 14.4 08/05/2006 1558   LYMPHSABS 0.7 03/24/2012 0900   LYMPHSABS 1.1 08/05/2006 1558   MONOABS 0.3 03/24/2012 0900   MONOABS 0.4 08/05/2006 1558   EOSABS 0.1 03/24/2012 0900   EOSABS 0.2 08/05/2006 1558   BASOSABS 0.0 03/24/2012 0900   BASOSABS 0.0 08/05/2006 1558    BMET    Component Value Date/Time   NA 137 04/08/2020 0325   K 4.0 04/08/2020 0325   CL 107 04/08/2020 0325   CO2 21 (L) 04/08/2020 0325   GLUCOSE 152 (H) 04/08/2020 0325   BUN 22 04/08/2020 0325   CREATININE 1.84 (H) 04/08/2020 0325   CALCIUM 8.0 (L) 04/08/2020 0325   GFRNONAA 35 (L) 04/08/2020 0325   GFRAA 41 (L) 04/08/2020 0325    INR    Component Value  Date/Time   INR 1.2 04/04/2020 1000     Intake/Output Summary (Last 24 hours) at 04/08/2020 0730 Last data filed at 04/08/2020 0710 Gross per 24 hour  Intake 2100 ml  Output 1125 ml  Net 975 ml     Assessment/Plan:  74 y.o. male is s/p left carotid endarterectomy 1 Day Post-Op. Doing well post op. Neurologically intact. Has ambulated in room without difficulty. On Aspirin and Statin. Will be discharge home today with follow up in 2-3 week with Dr. Carlis Abbott  DVT prophylaxis: sq Heparin     Karoline Caldwell, PA-C [facility] [phone] 04/08/2020 7:30 AM   I have seen and evaluated the patient. I agree with the PA note as documented above.  Postop day 1 status post left carotid endarterectomy for asymptomatic high-grade stenosis.  Neck looks good.  Neuro intact.  He is already up and walking this morning and wants to go home.  Instructed him to continue aspirin and statin.  Will arrange follow-up in clinic in 2 to 3 weeks.  Marty Heck, MD [facility] [place] Office: [phone]

## 2020-04-08 NOTE — Discharge Summary (Signed)
Carotid Discharge Summary     Derek [name] 07-21-1946 74 y.o. male  [id number]  Admission Date: 04/07/2020  Discharge Date: 04/08/2020  Physician: Marty Heck, MD  Admission Diagnosis: Carotid stenosis, asymptomatic, left [facility] Hospital Course:  Derek [name] is a very pleasant 74 year old male who was admitted to the hospital and taken to the operating room on 04/07/2020 and underwent a left carotid endarterectomy for asymptomatic high grade left carotid stenosis with ulcerated plaque.  The pt tolerated the procedure well and was transported to the PACU in good condition.  By POD 1, his neuro status remained intact. Left neck incision clean, dry and intact without swelling or hematoma. Otherwise remained hemodynamically stable. Pain well controlled.  The remainder of the hospital course consisted of increasing mobilization and increasing intake of solids without difficulty.  He will be discharged on Aspirin and Statin and will resume all his other home medications. He has been provided prescription for oral pain medication. He will follow up in the office in 2-3 weeks with Dr. Carlis Abbott   Recent Labs    04/08/20 0325  NA 137  K 4.0  CL 107  CO2 21*  GLUCOSE 152*  BUN 22  CALCIUM 8.0*   Recent Labs    04/07/20 1808 04/08/20 0325  WBC 6.0 6.8  HGB 10.8* 9.8*  HCT 34.1* 31.4*  PLT 87* 94*   No results for input(s): INR in the last 72 hours.   Discharge Instructions    Discharge patient   Complete by: As directed    Discharge disposition: 01-Home or Self Care   Discharge patient date: 04/08/2020      Discharge Diagnosis:  Carotid stenosis, asymptomatic, left [I65.22]  Secondary Diagnosis: Patient Active Problem List   Diagnosis Date Noted   Carotid stenosis, asymptomatic, left 04/07/2020   PVD (peripheral vascular disease) ([facility]) 09/05/2018   Obstruction of right carotid artery without cerebral infarction 05/27/2017   Carotid  disease, bilateral ([place]) 05/27/2017   Hx of colonic polyps 01/01/2015   Cervical pain 06/06/2014   Stiffness in joint 06/06/2014   Cubital tunnel syndrome on left 11/28/2012    Class: Chronic   Carpal tunnel syndrome on left 11/28/2012    Class: Chronic   CKD (chronic kidney disease) stage 3, GFR 30-59 ml/min 09/20/2012   Essential hypertension, benign 09/20/2012   Mixed hyperlipidemia 09/03/2009   CAD, NATIVE VESSEL 09/03/2009   Past Medical History:  Diagnosis Date   Anxiety    Arthritis    Carotid artery disease (HCC)    Cataract    Chronic idiopathic thrombocytopenia (HCC)    Chronic renal insufficiency    Single kidney   Coronary atherosclerosis of native coronary vessel    DES RCA 2007   Depression    Dysrhythmia    Essential hypertension    GERD (gastroesophageal reflux disease)    Hx of colonic polyps 01/01/2015   Mixed hyperlipidemia    Myocardial infarction [facility]) 2006-07-05   STEMI    Peripheral vascular disease ([place])     Allergies as of 04/08/2020      Reactions   Acetaminophen Other (See Comments)   One kidney Pt has one Kidney    Ibuprofen Other (See Comments)   One kidney Pt has one Kidney       Medication List    TAKE these medications   allopurinol 100 MG tablet Commonly known as: ZYLOPRIM Take 200 mg by mouth 2 (two) times daily.   aspirin  EC 81 MG tablet Take 81 mg by mouth daily.   atorvastatin 20 MG tablet Commonly known as: LIPITOR Take 20 mg by mouth daily.   Fish Oil Maximum Strength 1200 MG Caps Take 2,400 mg by mouth daily.   furosemide 40 MG tablet Commonly known as: LASIX Take 40 mg by mouth daily.   hydrALAZINE 25 MG tablet Commonly known as: APRESOLINE TAKE 1 TABLET BY MOUTH THREE TIMES DAILY What changed: when to take this   metoprolol succinate 25 MG 24 hr tablet Commonly known as: TOPROL-XL Take 25 mg by mouth daily.   nitroGLYCERIN 0.4 MG SL tablet Commonly known as: NITROSTAT Place 1  tablet (0.4 mg total) under the tongue every 5 (five) minutes x 3 doses as needed (if no relief after 3rd dose, proceed to the ED for an evaluation). For chest pain. If no relief after 3 rd dose, proceed to the ED for an evaluation. What changed: reasons to take this   oxyCODONE-acetaminophen 7.5-325 MG tablet Commonly known as: Percocet Take 1 tablet by mouth every 4 (four) hours as needed for severe pain.   PriLOSEC 20 MG capsule Generic drug: omeprazole Take 20 mg by mouth daily.   Valium 2 MG tablet Generic drug: diazepam Take 2 mg by mouth 2 (two) times a day.        Discharge Instructions:   [facility] Discharge Instructions Carotid Endarterectomy (CEA)  Please refer to the following instructions for your post-procedure care. Your surgeon or physician assistant will discuss any changes with you.  Activity  You are encouraged to walk as much as you can. You can slowly return to normal activities but must avoid strenuous activity and heavy lifting until your doctor tell you it's OK. Avoid activities such as vacuuming or swinging a golf club. You can drive after one week if you are comfortable and you are no longer taking prescription pain medications. It is normal to feel tired for several weeks after your surgery. It is also normal to have difficulty with sleep habits, eating, and bowel movements after surgery. These will go away with time.  Bathing/Showering  You may shower after you come home. Do not soak in a bathtub, hot tub, or swim until the incision heals completely.  Incision Care  Shower every day. Clean your incision with mild soap and water. Pat the area dry with a clean towel. You do not need a bandage unless otherwise instructed. Do not apply any ointments or creams to your incision. You may have skin glue on your incision. Do not peel it off. It will come off on its own in about one week. Your incision may feel thickened and  raised for several weeks after your surgery. This is normal and the skin will soften over time. For Men Only: It's OK to shave around the incision but do not shave the incision itself for 2 weeks. It is common to have numbness under your chin that could last for several months.  Diet  Resume your normal diet. There are no special food restrictions following this procedure. A low fat/low cholesterol diet is recommended for all patients with vascular disease. In order to heal from your surgery, it is CRITICAL to get adequate nutrition. Your body requires vitamins, minerals, and protein. Vegetables are the best source of vitamins and minerals. Vegetables also provide the perfect balance of protein. Processed food has little nutritional value, so try to avoid this.  Medications  Resume taking all  of your medications unless your doctor or physician assistant tells you not to.  If your incision is causing pain, you may take over-the- counter pain relievers such as acetaminophen (Tylenol). If you were prescribed a stronger pain medication, please be aware these medications can cause nausea and constipation.  Prevent nausea by taking the medication with a snack or meal. Avoid constipation by drinking plenty of fluids and eating foods with a high amount of fiber, such as fruits, vegetables, and grains. Do not take Tylenol if you are taking prescription pain medications.  Follow Up  Our office will schedule a follow up appointment 2-3 weeks following discharge.  Please call us immediately for any of the following conditions   Increased pain, redness, drainage (pus) from your incision site.  Fever of 101 degrees or higher.  If you should develop stroke (slurred speech, difficulty swallowing, weakness on one side of your body, loss of vision) you should call 911 and go to the nearest emergency room.   Reduce your risk of vascular disease:   Stop smoking. If you would like help call [facility] at  1-800-QUIT-NOW [phone]) or [place] at [phone].  Manage your cholesterol  Maintain a desired weight  Control your diabetes  Keep your blood pressure down   If you have any questions, please call the office at [phone].  Prescriptions given:  Oxycodone- Acetaminophen 7.5/325 mg #15 No Refill  Disposition: Home  Patient's condition: is Excellent  Follow up: 1. Dr. Carlis Abbott in 2 - 3 weeks.   Lamoyne Palencia PA-C [facility] [phone]   --- For [facility] Registry use ---   Modified Rankin score at D/C (0-6): 0  IV medication needed for:  1. Hypertension: No 2. Hypotension: No  Post-op Complications: No  1. Post-op CVA or TIA: No  If yes: Event classification (right eye, left eye, right cortical, left cortical, vertebrobasilar, other): n/a  If yes: Timing of event (intra-op, <6 hrs post-op, >=6 hrs post-op, unknown): n/a  2. CN injury: No  If yes: CN not injured   3. Myocardial infarction: No  If yes: Dx by (EKG or clinical, Troponin): n/a  4.  CHF: No  5.  Dysrhythmia (new): No  6. Wound infection: No  7. Reperfusion symptoms: No  8. Return to OR: No  If yes: return to OR for (bleeding, neurologic, other CEA incision, other): n/a  Discharge medications: Statin use:  Yes ASA use:  Yes   Beta blocker use:  Yes ACE-Inhibitor use:  No  ARB use:  No CCB use: No P2Y12 Antagonist use: No, [ ]  Plavix, [ ]  Plasugrel, [ ]  Ticlopinine, [ ]  Ticagrelor, [ ]  Other, [ ]  No for medical reason, [ ]  Non-compliant, [ ]  Not-indicated Anti-coagulant use:  No, [ ]  Warfarin, [ ]  Rivaroxaban, [ ]  Dabigatran,

## 2020-04-08 NOTE — Discharge Instructions (Signed)
° °  [facility] [place] ° °Discharge Instructions °  °Carotid Endarterectomy (CEA) ° °Please refer to the following instructions for your post-procedure care. Your surgeon or physician assistant will discuss any changes with you. ° °Activity ° °You are encouraged to walk as much as you can. You can slowly return to normal activities but must avoid strenuous activity and heavy lifting until your doctor tell you it's okay. Avoid activities such as vacuuming or swinging a golf club. You can drive after one week if you are comfortable and you are no longer taking prescription pain medications. It is normal to feel tired for serval weeks after your surgery. It is also normal to have difficulty with sleep habits, eating, and bowel movements after surgery. These will go away with time. ° °Bathing/Showering ° °Shower daily after you go home. Do not soak in a bathtub, hot tub, or swim until the incision heals completely. ° °Incision Care ° °Shower every day. Clean your incision with mild soap and water. Pat the area dry with a clean towel. You do not need a bandage unless otherwise instructed. Do not apply any ointments or creams to your incision. You may have skin glue on your incision. Do not peel it off. It will come off on its own in about one week. Your incision may feel thickened and raised for several weeks after your surgery. This is normal and the skin will soften over time.  ° °For Men Only: It's okay to shave around the incision but do not shave the incision itself for 2 weeks. It is common to have numbness under your chin that could last for several months. ° °Diet ° °Resume your normal diet. There are no special food restrictions following this procedure. A low fat/low cholesterol diet is recommended for all patients with vascular disease. In order to heal from your surgery, it is CRITICAL to get adequate nutrition. Your body requires vitamins, minerals, and protein. Vegetables are the  best source of vitamins and minerals. Vegetables also provide the perfect balance of protein. Processed food has little nutritional value, so try to avoid this. ° °Medications ° °Resume taking all of your medications unless your doctor or physician assistant tells you not to. If your incision is causing pain, you may take over-the- counter pain relievers such as acetaminophen (Tylenol). If you were prescribed a stronger pain medication, please be aware these medications can cause nausea and constipation. Prevent nausea by taking the medication with a snack or meal. Avoid constipation by drinking plenty of fluids and eating foods with a high amount of fiber, such as fruits, vegetables, and grains.  °Do not take Tylenol if you are taking prescription pain medications. ° °Follow Up ° °Our office will schedule a follow up appointment 2-3 weeks following discharge. ° °Please call us immediately for any of the following conditions ° °Increased pain, redness, drainage (pus) from your incision site. °Fever of 101 degrees or higher. °If you should develop stroke (slurred speech, difficulty swallowing, weakness on one side of your body, loss of vision) you should call 911 and go to the nearest emergency room. ° °Reduce your risk of vascular disease: ° °Stop smoking. If you would like help call [facility] at 1-800-QUIT-NOW (1-800-784-8669) or [place] at [phone]. °Manage your cholesterol °Maintain a desired weight °Control your diabetes °Keep your blood pressure down ° °If you have any questions, please call the office at [phone]. ° °

## 2020-04-29 ENCOUNTER — Other Ambulatory Visit: Payer: Self-pay

## 2020-04-29 ENCOUNTER — Ambulatory Visit (INDEPENDENT_AMBULATORY_CARE_PROVIDER_SITE_OTHER): Payer: Self-pay | Admitting: Vascular Surgery

## 2020-04-29 ENCOUNTER — Encounter: Payer: Self-pay | Admitting: Vascular Surgery

## 2020-04-29 VITALS — BP 165/77 | HR 78 | Temp 97.9°F | Resp 18 | Ht 70.0 in | Wt 197.0 lb

## 2020-04-29 DIAGNOSIS — I6522 Occlusion and stenosis of left carotid artery: Secondary | ICD-10-CM

## 2020-04-29 NOTE — Progress Notes (Signed)
Patient name: Derek [name] MRN: [id number] DOB: 07/02/1946 Sex: male  REASON FOR VISIT: Postop check after left carotid endarterectomy  HPI: Derek [name] is a 74 y.o. male that presents for postop check after left carotid endarterectomy on 04/07/2020 for an asymptomatic high-grade stenosis.  This was in the setting of a contralateral right ICA occlusion.  His postop course was uncomplicated and he was discharged home without issue.  On follow-up today reports he had one dizzy spell about 4 to 5 days after he was discharged.  Otherwise he states he had some numbness on the left side of his tongue that is improving.  He has had no other issues or other neurologic events at home.  Past Medical History:  Diagnosis Date  . Anxiety   . Arthritis   . Carotid artery disease ([place])   . Cataract   . Chronic idiopathic thrombocytopenia (HCC)   . Chronic renal insufficiency    Single kidney  . Coronary atherosclerosis of native coronary vessel    DES RCA 2007  . Depression   . Dysrhythmia   . Essential hypertension   . GERD (gastroesophageal reflux disease)   . Hx of colonic polyps 01/01/2015  . Mixed hyperlipidemia   . Myocardial infarction [facility]) 2006-07-05   STEMI   . Peripheral vascular disease [facility])     Past Surgical History:  Procedure Laterality Date  . CARDIAC CATHETERIZATION  2010   Patent stent and nonobsturctive cad following an abnormal cardiolite study may of 2010 with an apical lateral defect  . CARPAL TUNNEL RELEASE  11/28/2012   Procedure: CARPAL TUNNEL RELEASE;  Surgeon: Jessy Oto, MD;  Location: [facility];  Service: Orthopedics;  Laterality: Left;  Left anterior submuscular transposition of ulnar nerve at elbow, left open carpal tunnel release  . COLONOSCOPY    . ELBOW SURGERY Left 2013  . ENDARTERECTOMY Left 04/07/2020   Procedure: LEFT CAROTID ENDARTERECTOMY;  Surgeon: Marty Heck, MD;  Location: [place];  Service: Vascular;  Laterality: Left;  . EYE SURGERY     . [place]   screws 1962  . FRACTURE SURGERY    . HARDWARE REMOVAL  03/20/2012   Procedure: HARDWARE REMOVAL;  Surgeon: Jessy Oto, MD;  Location: [place];  Service: Orthopedics;  Laterality: Left;  Hardware removal 3 knowles pins left hip  . HERNIA REPAIR    . JOINT REPLACEMENT    . PATCH ANGIOPLASTY Left 04/07/2020   Procedure: Patch Angioplasty Left Carotid;  Surgeon: Marty Heck, MD;  Location: [place];  Service: Vascular;  Laterality: Left;  . TONSILLECTOMY    . TOTAL HIP ARTHROPLASTY  03/20/2012   Procedure: TOTAL HIP ARTHROPLASTY;  Surgeon: Jessy Oto, MD;  Location: [place];  Service: Orthopedics;  Laterality: Left;  Left total hip replacement, ceramic on poly    Family History  Problem Relation Age of Onset  . Stroke Other   . Coronary artery disease Other   . Colon cancer Brother        has colostomy   . Esophageal cancer Neg Hx   . Stomach cancer Neg Hx   . Rectal cancer Neg Hx   . Colon polyps Neg Hx     SOCIAL HISTORY: Social History   Tobacco Use  . Smoking status: Former Smoker    Packs/day: 3.00    Years: 42.00    Pack years: 126.00    Types: Cigarettes    Start date: 10/30/1960  Quit date: 07/23/2004    Years since quitting: 15.7  . Smokeless tobacco: Never Used  . Tobacco comment: Counseled to remain smoke free  Substance Use Topics  . Alcohol use: No    Alcohol/week: 0.0 standard drinks    Allergies  Allergen Reactions  . Acetaminophen Other (See Comments)    One kidney Pt has one Kidney   . Ibuprofen Other (See Comments)    One kidney Pt has one Kidney     Current Outpatient Medications  Medication Sig Dispense Refill  . allopurinol (ZYLOPRIM) 100 MG tablet Take 200 mg by mouth 2 (two) times daily.     Marland Kitchen aspirin EC 81 MG tablet Take 81 mg by mouth daily.    Marland Kitchen atorvastatin (LIPITOR) 20 MG tablet Take 20 mg by mouth daily.    . diazepam (VALIUM) 2 MG tablet Take 2 mg by mouth 2 (two) times a day.     .  furosemide (LASIX) 40 MG tablet Take 40 mg by mouth daily.     . hydrALAZINE (APRESOLINE) 25 MG tablet TAKE 1 TABLET BY MOUTH THREE TIMES DAILY (Patient taking differently: Take 25 mg by mouth in the morning and at bedtime. ) 180 tablet 3  . metoprolol succinate (TOPROL-XL) 25 MG 24 hr tablet Take 25 mg by mouth daily.     . nitroGLYCERIN (NITROSTAT) 0.4 MG SL tablet Place 1 tablet (0.4 mg total) under the tongue every 5 (five) minutes x 3 doses as needed (if no relief after 3rd dose, proceed to the ED for an evaluation). For chest pain. If no relief after 3 rd dose, proceed to the ED for an evaluation. (Patient taking differently: Place 0.4 mg under the tongue every 5 (five) minutes x 3 doses as needed for chest pain. For chest pain. If no relief after 3 rd dose, proceed to the ED for an evaluation.) 25 tablet 3  . Omega-3 Fatty Acids (FISH OIL MAXIMUM STRENGTH) 1200 MG CAPS Take 2,400 mg by mouth daily.     Marland Kitchen omeprazole (PRILOSEC) 20 MG capsule Take 20 mg by mouth daily.      Marland Kitchen oxyCODONE-acetaminophen (PERCOCET) 7.5-325 MG tablet Take 1 tablet by mouth every 4 (four) hours as needed for severe pain. (Patient not taking: Reported on 04/29/2020) 15 tablet 0   No current facility-administered medications for this visit.    REVIEW OF SYSTEMS:  [X]  denotes positive finding, [ ]  denotes negative finding Cardiac  Comments:  Chest pain or chest pressure:    Shortness of breath upon exertion:    Short of breath when lying flat:    Irregular heart rhythm:        Vascular    Pain in calf, thigh, or hip brought on by ambulation:    Pain in feet at night that wakes you up from your sleep:     Blood clot in your veins:    Leg swelling:         Pulmonary    Oxygen at home:    Productive cough:     Wheezing:         Neurologic    Sudden weakness in arms or legs:     Sudden numbness in arms or legs:     Sudden onset of difficulty speaking or slurred speech:    Temporary loss of vision in one eye:      Problems with dizziness:         Gastrointestinal    Blood in stool:  Vomited blood:         Genitourinary    Burning when urinating:     Blood in urine:        Psychiatric    Major depression:         Hematologic    Bleeding problems:    Problems with blood clotting too easily:        Skin    Rashes or ulcers:        Constitutional    Fever or chills:      PHYSICAL EXAM: Vitals:   04/29/20 1334 04/29/20 1340  BP: (!) 171/79 (!) 165/77  Pulse: 78 78  Resp: 18   Temp: 97.9 F (36.6 C)   TempSrc: Temporal   Weight: 197 lb (89.4 kg)   Height: 5\' 10"  (1.778 m)     GENERAL: The patient is a well-nourished male, in no acute distress. The vital signs are documented above. CARDIAC: There is a regular rate and rhythm.  VASCULAR:  Left carotid pulse 2+ palpable Left neck incision healing without issue PULMONARY: There is good air exchange bilaterally without wheezing or rales. ABDOMEN: Soft and non-tender with normal pitched bowel sounds.  MUSCULOSKELETAL: There are no major deformities or cyanosis. NEUROLOGIC: No focal weakness or paresthesias are detected. Cn II-XII appear grossly intact.  Tongue midline.   DATA:   None  Assessment/Plan:  74 year old male status post left carotid endarterectomy on 04/07/2020 for an asymptomatic high-grade stenosis in the setting of right ICA occlusion.  Overall seems to be doing pretty well and his neck incision is healing without issue.  He has an easily palpable left carotid pulse.  Did have some numbness on the left side of his tongue that is improving and his tongue appears midline and is otherwise swallowing fine.  We will plan to see him in 9 months with carotid duplex for ongoing surveillance.  We will continue aspirin and statin in the interim.   Marty Heck, MD [facility] [place] Office: [phone]

## 2020-04-30 ENCOUNTER — Other Ambulatory Visit: Payer: Self-pay | Admitting: *Deleted

## 2020-04-30 DIAGNOSIS — I6523 Occlusion and stenosis of bilateral carotid arteries: Secondary | ICD-10-CM

## 2020-04-30 DIAGNOSIS — J069 Acute upper respiratory infection, unspecified: Secondary | ICD-10-CM | POA: Diagnosis not present

## 2020-05-01 DIAGNOSIS — R7301 Impaired fasting glucose: Secondary | ICD-10-CM | POA: Diagnosis not present

## 2020-05-01 DIAGNOSIS — E782 Mixed hyperlipidemia: Secondary | ICD-10-CM | POA: Diagnosis not present

## 2020-05-01 DIAGNOSIS — D696 Thrombocytopenia, unspecified: Secondary | ICD-10-CM | POA: Diagnosis not present

## 2020-05-01 DIAGNOSIS — G5620 Lesion of ulnar nerve, unspecified upper limb: Secondary | ICD-10-CM | POA: Diagnosis not present

## 2020-05-01 DIAGNOSIS — I10 Essential (primary) hypertension: Secondary | ICD-10-CM | POA: Diagnosis not present

## 2020-05-01 DIAGNOSIS — D649 Anemia, unspecified: Secondary | ICD-10-CM | POA: Diagnosis not present

## 2020-05-13 DIAGNOSIS — R944 Abnormal results of kidney function studies: Secondary | ICD-10-CM | POA: Diagnosis not present

## 2020-05-13 DIAGNOSIS — I2510 Atherosclerotic heart disease of native coronary artery without angina pectoris: Secondary | ICD-10-CM | POA: Diagnosis not present

## 2020-05-13 DIAGNOSIS — I5032 Chronic diastolic (congestive) heart failure: Secondary | ICD-10-CM | POA: Diagnosis not present

## 2020-05-13 DIAGNOSIS — I10 Essential (primary) hypertension: Secondary | ICD-10-CM | POA: Diagnosis not present

## 2020-05-13 DIAGNOSIS — N1831 Chronic kidney disease, stage 3a: Secondary | ICD-10-CM | POA: Diagnosis not present

## 2020-07-14 ENCOUNTER — Encounter: Payer: Self-pay | Admitting: Cardiology

## 2020-07-14 NOTE — Progress Notes (Signed)
Cardiology Office Note  Date: 07/15/2020   ID: Derek, [name] Oct 24, 1946, MRN [id number]  PCP:  Celene Squibb, MD  Cardiologist:  [name], MD Electrophysiologist:  None   Chief Complaint  Patient presents with  . Cardiac follow-up    History of Present Illness: Derek [name] is a 74 y.o. male last seen in February.  He presents for a routine visit.  He does not describe any progressive angina symptoms or nitroglycerin use on current therapy.  Patient continues to follow with Dr. Carlis Abbott now status post left carotid endarterectomy in April, has known RICA occlusion as well.  He has recuperated well.  I reviewed his current medications which are listed below.  He reports compliance.  Blood pressure remains elevated.  We discussed initiating low-dose Norvasc.  I reviewed his ECG from February as outlined below.  He has not had follow-up with [facility] as yet.  He continues to work as a Contractor.  Past Medical History:  Diagnosis Date  . Anxiety   . Arthritis   . Carotid artery disease ([place])   . Cataract   . Chronic idiopathic thrombocytopenia (HCC)   . Chronic renal insufficiency    Single kidney  . Coronary atherosclerosis of native coronary vessel    DES RCA 2007  . Depression   . Essential hypertension   . GERD (gastroesophageal reflux disease)   . Hx of colonic polyps 01/01/2015  . Mixed hyperlipidemia   . Myocardial infarction [facility]) 2006-07-05   STEMI   . Peripheral vascular disease [facility])     Past Surgical History:  Procedure Laterality Date  . CARDIAC CATHETERIZATION  2010   Patent stent and nonobsturctive cad following an abnormal cardiolite study may of 2010 with an apical lateral defect  . CARPAL TUNNEL RELEASE  11/28/2012   Procedure: CARPAL TUNNEL RELEASE;  Surgeon: Jessy Oto, MD;  Location: [place];  Service: Orthopedics;  Laterality: Left;  Left anterior submuscular transposition of ulnar nerve at elbow, left open carpal  tunnel release  . COLONOSCOPY    . ELBOW SURGERY Left 2013  . ENDARTERECTOMY Left 04/07/2020   Procedure: LEFT CAROTID ENDARTERECTOMY;  Surgeon: Marty Heck, MD;  Location: [place];  Service: Vascular;  Laterality: Left;  . EYE SURGERY    . [place]   screws 1962  . FRACTURE SURGERY    . HARDWARE REMOVAL  03/20/2012   Procedure: HARDWARE REMOVAL;  Surgeon: Jessy Oto, MD;  Location: [place];  Service: Orthopedics;  Laterality: Left;  Hardware removal 3 knowles pins left hip  . HERNIA REPAIR    . JOINT REPLACEMENT    . PATCH ANGIOPLASTY Left 04/07/2020   Procedure: Patch Angioplasty Left Carotid;  Surgeon: Marty Heck, MD;  Location: [place];  Service: Vascular;  Laterality: Left;  . TONSILLECTOMY    . TOTAL HIP ARTHROPLASTY  03/20/2012   Procedure: TOTAL HIP ARTHROPLASTY;  Surgeon: Jessy Oto, MD;  Location: [place];  Service: Orthopedics;  Laterality: Left;  Left total hip replacement, ceramic on poly    Current Outpatient Medications  Medication Sig Dispense Refill  . allopurinol (ZYLOPRIM) 100 MG tablet Take 200 mg by mouth 2 (two) times daily.     Marland Kitchen aspirin EC 81 MG tablet Take 81 mg by mouth daily.    Marland Kitchen atorvastatin (LIPITOR) 20 MG tablet Take 20 mg by mouth daily.    . diazepam (VALIUM) 2 MG tablet Take 2  mg by mouth 2 (two) times a day.     . furosemide (LASIX) 40 MG tablet Take 40 mg by mouth daily.     . hydrALAZINE (APRESOLINE) 25 MG tablet TAKE 1 TABLET BY MOUTH THREE TIMES DAILY (Patient taking differently: Take 25 mg by mouth in the morning and at bedtime. ) 180 tablet 3  . metoprolol succinate (TOPROL-XL) 25 MG 24 hr tablet Take 25 mg by mouth daily.     . nitroGLYCERIN (NITROSTAT) 0.4 MG SL tablet Place 1 tablet (0.4 mg total) under the tongue every 5 (five) minutes x 3 doses as needed (if no relief after 3rd dose, proceed to the ED for an evaluation). For chest pain. If no relief after 3 rd dose, proceed to the ED for an evaluation. (Patient  taking differently: Place 0.4 mg under the tongue every 5 (five) minutes x 3 doses as needed for chest pain. For chest pain. If no relief after 3 rd dose, proceed to the ED for an evaluation.) 25 tablet 3  . Omega-3 Fatty Acids (FISH OIL MAXIMUM STRENGTH) 1200 MG CAPS Take 2,400 mg by mouth daily.     Marland Kitchen omeprazole (PRILOSEC) 20 MG capsule Take 20 mg by mouth daily.      Marland Kitchen oxyCODONE-acetaminophen (PERCOCET) 7.5-325 MG tablet Take 1 tablet by mouth every 4 (four) hours as needed for severe pain. 15 tablet 0  . amLODipine (NORVASC) 2.5 MG tablet Take 1 tablet (2.5 mg total) by mouth at bedtime. 90 tablet 3   No current facility-administered medications for this visit.   Allergies:  Acetaminophen and Ibuprofen   ROS:   Hearing loss.  No palpitations or syncope.  Physical Exam: VS:  BP (!) 166/82   Pulse 80   Ht 5\' 10"  (1.778 m)   Wt 201 lb (91.2 kg)   SpO2 96%   BMI 28.84 kg/m , BMI Body mass index is 28.84 kg/m.  Wt Readings from Last 3 Encounters:  07/15/20 201 lb (91.2 kg)  04/29/20 197 lb (89.4 kg)  04/07/20 202 lb 11.2 oz (91.9 kg)    General: Elderly male, appears comfortable at rest. HEENT: Conjunctiva and lids normal, wearing a mask. Neck: Supple, well-healed left CEA scar, no thyromegaly. Lungs: Clear to auscultation, nonlabored breathing at rest. Cardiac: Regular rate and rhythm, no S3 or significant systolic murmur, no pericardial rub. Extremities: Trace ankle edema, distal pulses 2+.  ECG:  An ECG dated 01/14/2020 was personally reviewed today and demonstrated:  Sinus rhythm with nonspecific ST-T changes.  Recent Labwork: 04/04/2020: ALT 24; AST 31 04/08/2020: BUN 22; Creatinine, Ser 1.84; Hemoglobin 9.8; Platelets 94; Potassium 4.0; Sodium 137   Other Studies Reviewed Today:  Lexiscan Cardiolite 11/04/2015:  There was no ST segment deviation noted during stress.  Findings consistent with prior inferior/inferolateral myocardial infarction with moderate peri-infarct  ischemia.  This is an intermediate risk study.  The left ventricular ejection fraction is normal (55-65%).  Assessment and Plan:  1.  CAD status post DES to the RCA in 2007 with medically managed residual disease.  He does not report any active angina at this time.  Continue aspirin, Toprol-XL, and Lipitor.  2.  Mixed hyperlipidemia, last LDL 62 on Lipitor.  3.  Essential hypertension, blood pressure not optimally controlled.  Continue Toprol-XL and hydralazine, add Norvasc beginning at 2.5 mg daily.  4.  CKD stage IIIb, creatinine 1.84.  Medication Adjustments/Labs and Tests Ordered: Current medicines are reviewed at length with the patient today.  Concerns regarding  medicines are outlined above.   Tests Ordered: No orders of the defined types were placed in this encounter.   Medication Changes: Meds ordered this encounter  Medications  . DISCONTD: amLODipine (NORVASC) 2.5 MG tablet    Sig: Take 1 tablet (2.5 mg total) by mouth daily.    Dispense:  90 tablet    Refill:  3  . amLODipine (NORVASC) 2.5 MG tablet    Sig: Take 1 tablet (2.5 mg total) by mouth at bedtime.    Dispense:  90 tablet    Refill:  3    Disposition:  Follow up 6 months in the [place] office.  Signed, Satira Sark, MD, [facility] 07/15/2020 2:33 PM    [place] at [facility] [address], [place], [place] [address] Phone: [phone]; Fax: [phone]

## 2020-07-15 ENCOUNTER — Ambulatory Visit: Payer: Medicare Other | Admitting: Cardiology

## 2020-07-15 ENCOUNTER — Other Ambulatory Visit: Payer: Self-pay

## 2020-07-15 ENCOUNTER — Encounter: Payer: Self-pay | Admitting: Cardiology

## 2020-07-15 VITALS — BP 166/82 | HR 80 | Ht 70.0 in | Wt 201.0 lb

## 2020-07-15 DIAGNOSIS — I25119 Atherosclerotic heart disease of native coronary artery with unspecified angina pectoris: Secondary | ICD-10-CM

## 2020-07-15 DIAGNOSIS — I10 Essential (primary) hypertension: Secondary | ICD-10-CM

## 2020-07-15 DIAGNOSIS — E782 Mixed hyperlipidemia: Secondary | ICD-10-CM

## 2020-07-15 DIAGNOSIS — N1832 Chronic kidney disease, stage 3b: Secondary | ICD-10-CM | POA: Diagnosis not present

## 2020-07-15 MED ORDER — AMLODIPINE BESYLATE 2.5 MG PO TABS
2.5000 mg | ORAL_TABLET | Freq: Every day | ORAL | 3 refills | Status: DC
Start: 2020-07-15 — End: 2020-07-15

## 2020-07-15 MED ORDER — AMLODIPINE BESYLATE 2.5 MG PO TABS
2.5000 mg | ORAL_TABLET | Freq: Every day | ORAL | 3 refills | Status: DC
Start: 2020-07-15 — End: 2021-09-23

## 2020-07-15 NOTE — Patient Instructions (Signed)
Medication Instructions:  START AMLODIPINE 2.5 MG DAILY AT BEDTIME   *If you need a refill on your cardiac medications before your next appointment, please call your pharmacy*   Lab Work: NONE TODAY If you have labs (blood work) drawn today and your tests are completely normal, you will receive your results only by: Marland Kitchen MyChart Message (if you have MyChart) OR . A paper copy in the mail If you have any lab test that is abnormal or we need to change your treatment, we will call you to review the results.   Testing/Procedures: NONE TODAY   Follow-Up: At [facility], you and your health needs are our priority.  As part of our continuing mission to provide you with exceptional heart care, we have created designated Provider Care Teams.  These Care Teams include your primary Cardiologist (physician) and Advanced Practice Providers (APPs -  Physician Assistants and Nurse Practitioners) who all work together to provide you with the care you need, when you need it.  We recommend signing up for the patient portal called "MyChart".  Sign up information is provided on this After Visit Summary.  MyChart is used to connect with patients for Virtual Visits (Telemedicine).  Patients are able to view lab/test results, encounter notes, upcoming appointments, etc.  Non-urgent messages can be sent to your provider as well.   To learn more about what you can do with MyChart, go to [email or web address].    Your next appointment:   6 month(s)  The format for your next appointment:   In Person  Provider:   [name], MD   Other Instructions NONE       Thank you for choosing [facility] !

## 2020-07-29 ENCOUNTER — Other Ambulatory Visit: Payer: Self-pay | Admitting: Cardiology

## 2020-08-04 ENCOUNTER — Other Ambulatory Visit: Payer: Self-pay | Admitting: Cardiology

## 2020-08-06 ENCOUNTER — Telehealth: Payer: Self-pay | Admitting: Cardiology

## 2020-08-06 NOTE — Telephone Encounter (Signed)
Completed.

## 2020-08-06 NOTE — Telephone Encounter (Signed)
New message     *STAT* If patient is at the pharmacy, call can be transferred to refill team.   1. Which medications need to be refilled? (please list name of each medication and dose if known) metoprolol succinate (TOPROL-XL) 25 MG 24 hr tablet  2. Which pharmacy/location (including street and city if local pharmacy) is medication to be sent to? [facility] in [place]   3. Do they need a 30 day or 90 day supply? [place]

## 2020-08-11 ENCOUNTER — Other Ambulatory Visit: Payer: Self-pay

## 2020-08-11 MED ORDER — ATORVASTATIN CALCIUM 20 MG PO TABS: 20.0000 mg | ORAL_TABLET | Freq: Every day | ORAL | 9 refills | Status: DC

## 2020-08-21 DIAGNOSIS — N1831 Chronic kidney disease, stage 3a: Secondary | ICD-10-CM | POA: Diagnosis not present

## 2020-08-21 DIAGNOSIS — M542 Cervicalgia: Secondary | ICD-10-CM | POA: Diagnosis not present

## 2020-08-21 DIAGNOSIS — R7301 Impaired fasting glucose: Secondary | ICD-10-CM | POA: Diagnosis not present

## 2020-08-21 DIAGNOSIS — I6523 Occlusion and stenosis of bilateral carotid arteries: Secondary | ICD-10-CM | POA: Diagnosis not present

## 2020-08-21 DIAGNOSIS — R5383 Other fatigue: Secondary | ICD-10-CM | POA: Diagnosis not present

## 2020-08-21 DIAGNOSIS — D696 Thrombocytopenia, unspecified: Secondary | ICD-10-CM | POA: Diagnosis not present

## 2020-08-26 DIAGNOSIS — N1831 Chronic kidney disease, stage 3a: Secondary | ICD-10-CM | POA: Diagnosis not present

## 2020-08-26 DIAGNOSIS — Z23 Encounter for immunization: Secondary | ICD-10-CM | POA: Diagnosis not present

## 2020-08-26 DIAGNOSIS — Z0001 Encounter for general adult medical examination with abnormal findings: Secondary | ICD-10-CM | POA: Diagnosis not present

## 2020-08-26 DIAGNOSIS — I2510 Atherosclerotic heart disease of native coronary artery without angina pectoris: Secondary | ICD-10-CM | POA: Diagnosis not present

## 2020-08-26 DIAGNOSIS — I10 Essential (primary) hypertension: Secondary | ICD-10-CM | POA: Diagnosis not present

## 2020-08-26 DIAGNOSIS — I5032 Chronic diastolic (congestive) heart failure: Secondary | ICD-10-CM | POA: Diagnosis not present

## 2020-10-23 DIAGNOSIS — L570 Actinic keratosis: Secondary | ICD-10-CM | POA: Diagnosis not present

## 2020-10-23 DIAGNOSIS — Z85828 Personal history of other malignant neoplasm of skin: Secondary | ICD-10-CM | POA: Diagnosis not present

## 2020-10-23 DIAGNOSIS — L821 Other seborrheic keratosis: Secondary | ICD-10-CM | POA: Diagnosis not present

## 2020-10-23 DIAGNOSIS — L820 Inflamed seborrheic keratosis: Secondary | ICD-10-CM | POA: Diagnosis not present

## 2020-10-23 DIAGNOSIS — D485 Neoplasm of uncertain behavior of skin: Secondary | ICD-10-CM | POA: Diagnosis not present

## 2020-10-23 DIAGNOSIS — D1801 Hemangioma of skin and subcutaneous tissue: Secondary | ICD-10-CM | POA: Diagnosis not present

## 2020-10-24 DIAGNOSIS — E782 Mixed hyperlipidemia: Secondary | ICD-10-CM | POA: Diagnosis not present

## 2020-10-24 DIAGNOSIS — I5032 Chronic diastolic (congestive) heart failure: Secondary | ICD-10-CM | POA: Diagnosis not present

## 2020-10-24 DIAGNOSIS — N1831 Chronic kidney disease, stage 3a: Secondary | ICD-10-CM | POA: Diagnosis not present

## 2020-10-24 DIAGNOSIS — Z23 Encounter for immunization: Secondary | ICD-10-CM | POA: Diagnosis not present

## 2020-10-24 DIAGNOSIS — I130 Hypertensive heart and chronic kidney disease with heart failure and stage 1 through stage 4 chronic kidney disease, or unspecified chronic kidney disease: Secondary | ICD-10-CM | POA: Diagnosis not present

## 2020-10-29 DIAGNOSIS — H524 Presbyopia: Secondary | ICD-10-CM | POA: Diagnosis not present

## 2020-10-29 DIAGNOSIS — Z961 Presence of intraocular lens: Secondary | ICD-10-CM | POA: Diagnosis not present

## 2020-10-29 DIAGNOSIS — H52223 Regular astigmatism, bilateral: Secondary | ICD-10-CM | POA: Diagnosis not present

## 2020-12-04 ENCOUNTER — Other Ambulatory Visit: Payer: Self-pay | Admitting: Cardiology

## 2020-12-12 DIAGNOSIS — N1831 Chronic kidney disease, stage 3a: Secondary | ICD-10-CM | POA: Diagnosis not present

## 2020-12-12 DIAGNOSIS — I10 Essential (primary) hypertension: Secondary | ICD-10-CM | POA: Diagnosis not present

## 2020-12-12 DIAGNOSIS — I2510 Atherosclerotic heart disease of native coronary artery without angina pectoris: Secondary | ICD-10-CM | POA: Diagnosis not present

## 2021-01-15 ENCOUNTER — Ambulatory Visit: Payer: Medicare Other | Admitting: Cardiology

## 2021-01-15 DIAGNOSIS — N261 Atrophy of kidney (terminal): Secondary | ICD-10-CM | POA: Diagnosis not present

## 2021-01-15 DIAGNOSIS — N1831 Chronic kidney disease, stage 3a: Secondary | ICD-10-CM | POA: Diagnosis not present

## 2021-01-15 DIAGNOSIS — N281 Cyst of kidney, acquired: Secondary | ICD-10-CM | POA: Diagnosis not present

## 2021-01-26 ENCOUNTER — Other Ambulatory Visit: Payer: Self-pay | Admitting: *Deleted

## 2021-01-26 NOTE — Progress Notes (Signed)
Cardiology Office Note  Date: 01/27/2021   ID: Derek, [name] 1946-05-25, MRN [id number]  PCP:  Celene Squibb, MD  Cardiologist:  [name], MD Electrophysiologist:  None   Chief Complaint  Patient presents with  . Cardiac follow-up    History of Present Illness: Derek [name] is a 75 y.o. male last seen in August 2021.  He presents for a routine visit.  Reports no active angina at this time on medical therapy, no sense of palpitations or shortness of breath.  I reviewed his medications which are outlined below.  He reports compliance with therapy.  No recent nitroglycerin use.  He plans to see Dr. [place] Crane in the next month or so for follow-up.  I personally reviewed his ECG today which shows rate controlled atypical atrial flutter with variable conduction, nonspecific ST changes.  This rhythm is newly documented.  We discussed implications today.  CHA2DS2-VASc score is 4.  We discussed rationale for anticoagulation to reduce stroke risk.  Past Medical History:  Diagnosis Date  . Anxiety   . Arthritis   . Carotid artery disease ([place])   . Cataract   . Chronic idiopathic thrombocytopenia (HCC)   . Chronic renal insufficiency    Single kidney  . Coronary atherosclerosis of native coronary vessel    DES RCA 2007  . Depression   . Essential hypertension   . GERD (gastroesophageal reflux disease)   . Hx of colonic polyps 01/01/2015  . Mixed hyperlipidemia   . Myocardial infarction [facility]) 2006-07-05   STEMI   . Peripheral vascular disease [facility])     Past Surgical History:  Procedure Laterality Date  . CARDIAC CATHETERIZATION  2010   Patent stent and nonobsturctive cad following an abnormal cardiolite study may of 2010 with an apical lateral defect  . CARPAL TUNNEL RELEASE  11/28/2012   Procedure: CARPAL TUNNEL RELEASE;  Surgeon: Jessy Oto, MD;  Location: [place];  Service: Orthopedics;  Laterality: Left;  Left anterior submuscular transposition of ulnar nerve at  elbow, left open carpal tunnel release  . COLONOSCOPY    . ELBOW SURGERY Left 2013  . ENDARTERECTOMY Left 04/07/2020   Procedure: LEFT CAROTID ENDARTERECTOMY;  Surgeon: Marty Heck, MD;  Location: [place];  Service: Vascular;  Laterality: Left;  . EYE SURGERY    . [place]   screws 1962  . FRACTURE SURGERY    . HARDWARE REMOVAL  03/20/2012   Procedure: HARDWARE REMOVAL;  Surgeon: Jessy Oto, MD;  Location: [place];  Service: Orthopedics;  Laterality: Left;  Hardware removal 3 knowles pins left hip  . HERNIA REPAIR    . JOINT REPLACEMENT    . PATCH ANGIOPLASTY Left 04/07/2020   Procedure: Patch Angioplasty Left Carotid;  Surgeon: Marty Heck, MD;  Location: [place];  Service: Vascular;  Laterality: Left;  . TONSILLECTOMY    . TOTAL HIP ARTHROPLASTY  03/20/2012   Procedure: TOTAL HIP ARTHROPLASTY;  Surgeon: Jessy Oto, MD;  Location: [place];  Service: Orthopedics;  Laterality: Left;  Left total hip replacement, ceramic on poly    Current Outpatient Medications  Medication Sig Dispense Refill  . allopurinol (ZYLOPRIM) 100 MG tablet Take 200 mg by mouth 2 (two) times daily.     Marland Kitchen amLODipine (NORVASC) 2.5 MG tablet Take 1 tablet (2.5 mg total) by mouth at bedtime. 90 tablet 3  . aspirin EC 81 MG tablet Take 81 mg by mouth daily.    Marland Kitchen  atorvastatin (LIPITOR) 20 MG tablet Take 1 tablet (20 mg total) by mouth daily. 30 tablet 9  . diazepam (VALIUM) 2 MG tablet Take 2 mg by mouth 2 (two) times a day.    . furosemide (LASIX) 40 MG tablet Take 40 mg by mouth daily.    . hydrALAZINE (APRESOLINE) 25 MG tablet TAKE 1 TABLET BY MOUTH THREE TIMES DAILY 180 tablet 0  . metoprolol succinate (TOPROL-XL) 25 MG 24 hr tablet Take 25 mg by mouth daily.     . metoprolol succinate (TOPROL-XL) 25 MG 24 hr tablet Take 1 tablet (25 mg total) by mouth daily. 90 tablet 2  . nitroGLYCERIN (NITROSTAT) 0.4 MG SL tablet Place 1 tablet (0.4 mg total) under the tongue every 5 (five) minutes  x 3 doses as needed (if no relief after 3rd dose, proceed to the ED for an evaluation). For chest pain. If no relief after 3 rd dose, proceed to the ED for an evaluation. (Patient taking differently: Place 0.4 mg under the tongue every 5 (five) minutes x 3 doses as needed for chest pain. For chest pain. If no relief after 3 rd dose, proceed to the ED for an evaluation.) 25 tablet 3  . Omega-3 Fatty Acids (FISH OIL MAXIMUM STRENGTH) 1200 MG CAPS Take 2,400 mg by mouth daily.    Marland Kitchen omeprazole (PRILOSEC) 20 MG capsule Take 20 mg by mouth daily.       No current facility-administered medications for this visit.   Allergies:  Acetaminophen and Ibuprofen   ROS: No syncope.  Physical Exam: VS:  BP 140/62   Pulse 63   Ht 5\' 10"  (1.778 m)   Wt 202 lb (91.6 kg)   SpO2 99%   BMI 28.98 kg/m , BMI Body mass index is 28.98 kg/m.  Wt Readings from Last 3 Encounters:  01/27/21 202 lb (91.6 kg)  07/15/20 201 lb (91.2 kg)  04/29/20 197 lb (89.4 kg)    General: Elderly male, appears comfortable at rest. HEENT: Conjunctiva and lids normal, wearing a mask. Neck: Supple, left CEA scar, no thyromegaly. Lungs: Clear to auscultation, nonlabored breathing at rest. Cardiac: Intermittently irregular, no S3 or significant systolic murmur, no pericardial rub. Extremities: No pitting edema.  ECG:  An ECG dated 01/14/2020 was personally reviewed today and demonstrated:  Normal sinus rhythm with nonspecific ST-T changes.  Recent Labwork: 04/04/2020: ALT 24; AST 31 04/08/2020: BUN 22; Creatinine, Ser 1.84; Hemoglobin 9.8; Platelets 94; Potassium 4.0; Sodium 137   Other Studies Reviewed Today:  Lexiscan Cardiolite 11/04/2015:  There was no ST segment deviation noted during stress.  Findings consistent with prior inferior/inferolateral myocardial infarction with moderate peri-infarct ischemia.  This is an intermediate risk study.  The left ventricular ejection fraction is normal (55-65%).  Assessment and  Plan:  1.  Newly documented atypical atrial flutter with CHA2DS2-VASc score of 4.  He is asymptomatic in terms of palpitations and heart rate is well controlled on current regimen.  Plan to obtain CBC and BMET with goal to switch from aspirin to Eliquis.  2.  CAD status post DES to the RCA in 2007, otherwise residual disease managed medically.  No active angina at this time.  Continue Norvasc, Toprol-XL, and Lipitor.  3.  CKD stage IIIb, last creatinine 1.84.  Medication Adjustments/Labs and Tests Ordered: Current medicines are reviewed at length with the patient today.  Concerns regarding medicines are outlined above.   Tests Ordered: Orders Placed This Encounter  Procedures  . Basic metabolic panel  .  CBC  . CBC  . Basic metabolic panel  . EKG 12-Lead    Medication Changes: No orders of the defined types were placed in this encounter.   Disposition:  Follow up 3 to 4 months in the [place] office.  Signed, Satira Sark, MD, [name] 01/27/2021 10:11 AM    [place] at [place], [place], [place] [address] Phone: [phone]; Fax: [phone]

## 2021-01-27 ENCOUNTER — Encounter: Payer: Self-pay | Admitting: Cardiology

## 2021-01-27 ENCOUNTER — Ambulatory Visit: Payer: Medicare Other | Admitting: Cardiology

## 2021-01-27 VITALS — BP 140/62 | HR 63 | Ht 70.0 in | Wt 202.0 lb

## 2021-01-27 DIAGNOSIS — Z79899 Other long term (current) drug therapy: Secondary | ICD-10-CM | POA: Diagnosis not present

## 2021-01-27 DIAGNOSIS — I484 Atypical atrial flutter: Secondary | ICD-10-CM | POA: Diagnosis not present

## 2021-01-27 DIAGNOSIS — I4892 Unspecified atrial flutter: Secondary | ICD-10-CM

## 2021-01-27 DIAGNOSIS — N1832 Chronic kidney disease, stage 3b: Secondary | ICD-10-CM

## 2021-01-27 DIAGNOSIS — I25119 Atherosclerotic heart disease of native coronary artery with unspecified angina pectoris: Secondary | ICD-10-CM | POA: Diagnosis not present

## 2021-01-27 NOTE — Patient Instructions (Addendum)
Medication Instructions:   Your physician recommends that you continue on your current medications as directed. Please refer to the Current Medication list given to you today.  Labwork:  Your physician recommends that you return for lab work in: TODAY to check your BMET & CBC. [facility].  Your physician recommends that you return for lab work in: 3-4 months before your next visit to recheck your BMET & CBC. [facility].  Testing/Procedures:  none  Follow-Up:  Your physician recommends that you schedule a follow-up appointment in: 3-4 months.   Any Other Special Instructions Will Be Listed Below (If Applicable).  If you need a refill on your cardiac medications before your next appointment, please call your pharmacy.

## 2021-01-28 DIAGNOSIS — I4892 Unspecified atrial flutter: Secondary | ICD-10-CM | POA: Diagnosis not present

## 2021-01-28 DIAGNOSIS — Z79899 Other long term (current) drug therapy: Secondary | ICD-10-CM | POA: Diagnosis not present

## 2021-01-29 ENCOUNTER — Telehealth: Payer: Self-pay | Admitting: *Deleted

## 2021-01-29 LAB — CBC
HCT: 40.7 % (ref 38.5–50.0)
Hemoglobin: 13.5 g/dL (ref 13.2–17.1)
MCH: 26.9 pg — ABNORMAL LOW (ref 27.0–33.0)
MCHC: 33.2 g/dL (ref 32.0–36.0)
MCV: 81.2 fL (ref 80.0–100.0)
MPV: 12 fL (ref 7.5–12.5)
Platelets: 115 10*3/uL — ABNORMAL LOW (ref 140–400)
RBC: 5.01 10*6/uL (ref 4.20–5.80)
RDW: 15.3 % — ABNORMAL HIGH (ref 11.0–15.0)
WBC: 5.2 10*3/uL (ref 3.8–10.8)

## 2021-01-29 LAB — BASIC METABOLIC PANEL
BUN/Creatinine Ratio: 12 (calc) (ref 6–22)
BUN: 19 mg/dL (ref 7–25)
CO2: 29 mmol/L (ref 20–32)
Calcium: 9.1 mg/dL (ref 8.6–10.3)
Chloride: 107 mmol/L (ref 98–110)
Creat: 1.57 mg/dL — ABNORMAL HIGH (ref 0.70–1.18)
Glucose, Bld: 115 mg/dL — ABNORMAL HIGH (ref 65–99)
Potassium: 4 mmol/L (ref 3.5–5.3)
Sodium: 144 mmol/L (ref 135–146)

## 2021-01-29 MED ORDER — APIXABAN 5 MG PO TABS: 5.0000 mg | ORAL_TABLET | Freq: Two times a day (BID) | ORAL | 6 refills | Status: DC

## 2021-01-29 NOTE — Telephone Encounter (Signed)
Patient informed and verbalized understanding of plan. Copy sent to PCP 

## 2021-01-29 NOTE — Telephone Encounter (Signed)
-----   Message from Satira Sark, MD sent at 01/29/2021  8:00 AM EST ----- Results reviewed.  Creatinine 1.57, potassium normal.  Hemoglobin already resulted at 13.5 and platelets 115.  Would switch from aspirin to Eliquis 5 mg twice daily.  For follow-up in the next 3 to 4 months he will need a CBC and BMET.

## 2021-01-29 NOTE — Telephone Encounter (Signed)
-----   Message from Satira Sark, MD sent at 01/28/2021  4:30 PM EST ----- Results reviewed.  Hemoglobin normal at 13.5.  Platelets mildly low at 115, has chronic thrombocytopenia.  No significant change.

## 2021-02-09 DIAGNOSIS — E782 Mixed hyperlipidemia: Secondary | ICD-10-CM | POA: Diagnosis not present

## 2021-02-09 DIAGNOSIS — I6523 Occlusion and stenosis of bilateral carotid arteries: Secondary | ICD-10-CM | POA: Diagnosis not present

## 2021-02-09 DIAGNOSIS — M65352 Trigger finger, left little finger: Secondary | ICD-10-CM | POA: Diagnosis not present

## 2021-02-09 DIAGNOSIS — R7301 Impaired fasting glucose: Secondary | ICD-10-CM | POA: Diagnosis not present

## 2021-02-09 DIAGNOSIS — K219 Gastro-esophageal reflux disease without esophagitis: Secondary | ICD-10-CM | POA: Diagnosis not present

## 2021-02-09 DIAGNOSIS — D649 Anemia, unspecified: Secondary | ICD-10-CM | POA: Diagnosis not present

## 2021-02-09 DIAGNOSIS — R911 Solitary pulmonary nodule: Secondary | ICD-10-CM | POA: Diagnosis not present

## 2021-02-09 DIAGNOSIS — I10 Essential (primary) hypertension: Secondary | ICD-10-CM | POA: Diagnosis not present

## 2021-02-09 DIAGNOSIS — D696 Thrombocytopenia, unspecified: Secondary | ICD-10-CM | POA: Diagnosis not present

## 2021-02-09 DIAGNOSIS — M109 Gout, unspecified: Secondary | ICD-10-CM | POA: Diagnosis not present

## 2021-02-09 DIAGNOSIS — I2510 Atherosclerotic heart disease of native coronary artery without angina pectoris: Secondary | ICD-10-CM | POA: Diagnosis not present

## 2021-02-10 ENCOUNTER — Ambulatory Visit (HOSPITAL_COMMUNITY)
Admission: RE | Admit: 2021-02-10 | Discharge: 2021-02-10 | Disposition: A | Discharge status: Home / Self Care | Payer: Medicare Other | Source: Ambulatory Visit | Attending: Vascular Surgery | Admitting: Vascular Surgery

## 2021-02-10 ENCOUNTER — Ambulatory Visit: Payer: Medicare Other | Admitting: Vascular Surgery

## 2021-02-10 ENCOUNTER — Other Ambulatory Visit: Payer: Self-pay

## 2021-02-10 VITALS — BP 149/82 | HR 76 | Temp 98.0°F | Resp 18 | Ht 70.0 in | Wt 198.0 lb

## 2021-02-10 DIAGNOSIS — I6523 Occlusion and stenosis of bilateral carotid arteries: Secondary | ICD-10-CM | POA: Insufficient documentation

## 2021-02-10 NOTE — Progress Notes (Signed)
Patient name: Derek [name] MRN: [id number] DOB: 1946/06/16 Sex: male  REASON FOR VISIT: Follow-up for carotid artery surveillance  HPI: Derek [name] is a 75 y.o. male who presents for surveillance of his carotid artery disease.  He has a known right carotid artery occlusion.  He most recently underwent a left carotid endarterectomy on 04/07/2020 for an asymptomatic high-grade stenosis.  His postop course was uncomplicated and he was discharged home without issue.  Reports no issues over the last 9 months.  He has had no neurologic events.  He is taking a baby aspirin.  Does not want to take a DOAC for his atrial fibrillation.  Past Medical History:  Diagnosis Date  . Anxiety   . Arthritis   . Carotid artery disease ([place])   . Cataract   . Chronic idiopathic thrombocytopenia (HCC)   . Chronic renal insufficiency    Single kidney  . Coronary atherosclerosis of native coronary vessel    DES RCA 2007  . Depression   . Essential hypertension   . GERD (gastroesophageal reflux disease)   . Hx of colonic polyps 01/01/2015  . Mixed hyperlipidemia   . Myocardial infarction [facility]) 2006-07-05   STEMI   . Peripheral vascular disease [facility])     Past Surgical History:  Procedure Laterality Date  . CARDIAC CATHETERIZATION  2010   Patent stent and nonobsturctive cad following an abnormal cardiolite study may of 2010 with an apical lateral defect  . CARPAL TUNNEL RELEASE  11/28/2012   Procedure: CARPAL TUNNEL RELEASE;  Surgeon: Jessy Oto, MD;  Location: [place];  Service: Orthopedics;  Laterality: Left;  Left anterior submuscular transposition of ulnar nerve at elbow, left open carpal tunnel release  . COLONOSCOPY    . ELBOW SURGERY Left 2013  . ENDARTERECTOMY Left 04/07/2020   Procedure: LEFT CAROTID ENDARTERECTOMY;  Surgeon: Marty Heck, MD;  Location: [place];  Service: Vascular;  Laterality: Left;  . EYE SURGERY    . [place]   screws 1962  . FRACTURE SURGERY     . HARDWARE REMOVAL  03/20/2012   Procedure: HARDWARE REMOVAL;  Surgeon: Jessy Oto, MD;  Location: [place];  Service: Orthopedics;  Laterality: Left;  Hardware removal 3 knowles pins left hip  . HERNIA REPAIR    . JOINT REPLACEMENT    . PATCH ANGIOPLASTY Left 04/07/2020   Procedure: Patch Angioplasty Left Carotid;  Surgeon: Marty Heck, MD;  Location: [place];  Service: Vascular;  Laterality: Left;  . TONSILLECTOMY    . TOTAL HIP ARTHROPLASTY  03/20/2012   Procedure: TOTAL HIP ARTHROPLASTY;  Surgeon: Jessy Oto, MD;  Location: [place];  Service: Orthopedics;  Laterality: Left;  Left total hip replacement, ceramic on poly    Family History  Problem Relation Age of Onset  . Stroke Other   . Coronary artery disease Other   . Colon cancer Brother        has colostomy   . Esophageal cancer Neg Hx   . Stomach cancer Neg Hx   . Rectal cancer Neg Hx   . Colon polyps Neg Hx     SOCIAL HISTORY: Social History   Tobacco Use  . Smoking status: Former Smoker    Packs/day: 3.00    Years: 42.00    Pack years: 126.00    Types: Cigarettes    Start date: 10/30/1960    Quit date: 07/23/2004    Years since quitting: 16.5  .  Smokeless tobacco: Never Used  . Tobacco comment: Counseled to remain smoke free  Substance Use Topics  . Alcohol use: No    Alcohol/week: 0.0 standard drinks    Allergies  Allergen Reactions  . Acetaminophen Other (See Comments)    One kidney Pt has one Kidney   . Ibuprofen Other (See Comments)    One kidney Pt has one Kidney     Current Outpatient Medications  Medication Sig Dispense Refill  . allopurinol (ZYLOPRIM) 100 MG tablet Take 200 mg by mouth 2 (two) times daily.     Marland Kitchen atorvastatin (LIPITOR) 20 MG tablet Take 1 tablet (20 mg total) by mouth daily. 30 tablet 9  . diazepam (VALIUM) 2 MG tablet Take 2 mg by mouth 2 (two) times a day.    . furosemide (LASIX) 40 MG tablet Take 40 mg by mouth daily.    . hydrALAZINE (APRESOLINE) 25 MG tablet TAKE 1  TABLET BY MOUTH THREE TIMES DAILY 180 tablet 0  . metoprolol succinate (TOPROL-XL) 25 MG 24 hr tablet Take 25 mg by mouth daily.     . Omega-3 Fatty Acids (FISH OIL MAXIMUM STRENGTH) 1200 MG CAPS Take 2,400 mg by mouth daily.    Marland Kitchen omeprazole (PRILOSEC) 20 MG capsule Take 20 mg by mouth daily.      Marland Kitchen amLODipine (NORVASC) 2.5 MG tablet Take 1 tablet (2.5 mg total) by mouth at bedtime. 90 tablet 3  . apixaban (ELIQUIS) 5 MG TABS tablet Take 1 tablet (5 mg total) by mouth 2 (two) times daily. (Patient not taking: Reported on 02/10/2021) 60 tablet 6  . metoprolol succinate (TOPROL-XL) 25 MG 24 hr tablet Take 1 tablet (25 mg total) by mouth daily. 90 tablet 2  . nitroGLYCERIN (NITROSTAT) 0.4 MG SL tablet Place 1 tablet (0.4 mg total) under the tongue every 5 (five) minutes x 3 doses as needed (if no relief after 3rd dose, proceed to the ED for an evaluation). For chest pain. If no relief after 3 rd dose, proceed to the ED for an evaluation. (Patient not taking: Reported on 02/10/2021) 25 tablet 3   No current facility-administered medications for this visit.    REVIEW OF SYSTEMS:  [X]  denotes positive finding, [ ]  denotes negative finding Cardiac  Comments:  Chest pain or chest pressure:    Shortness of breath upon exertion:    Short of breath when lying flat:    Irregular heart rhythm:        Vascular    Pain in calf, thigh, or hip brought on by ambulation:    Pain in feet at night that wakes you up from your sleep:     Blood clot in your veins:    Leg swelling:         Pulmonary    Oxygen at home:    Productive cough:     Wheezing:         Neurologic    Sudden weakness in arms or legs:     Sudden numbness in arms or legs:     Sudden onset of difficulty speaking or slurred speech:    Temporary loss of vision in one eye:     Problems with dizziness:         Gastrointestinal    Blood in stool:     Vomited blood:         Genitourinary    Burning when urinating:     Blood in urine:  Psychiatric    Major depression:         Hematologic    Bleeding problems:    Problems with blood clotting too easily:        Skin    Rashes or ulcers:        Constitutional    Fever or chills:      PHYSICAL EXAM: Vitals:   02/10/21 1443 02/10/21 1447  BP: (!) 159/81 (!) 149/82  Pulse: 75 76  Resp: 18   Temp: 98 F (36.7 C)   TempSrc: Temporal   SpO2: 98%   Weight: 198 lb (89.8 kg)   Height: 5\' 10"  (1.778 m)     GENERAL: The patient is a well-nourished male, in no acute distress. The vital signs are documented above. CARDIAC: There is a regular rate and rhythm.  VASCULAR:  Left neck incision well healed. PULMONARY: No respiratory distress. ABDOMEN: Soft and non-tender. MUSCULOSKELETAL: There are no major deformities or cyanosis. NEUROLOGIC: No focal weakness or paresthesias are detected. Cn II-XII appear grossly intact.     DATA:   Duplex today shows right ICA occlusion and a left ICA velocity consistent with 60-79% stenosis but I think this is overestimated given contralateral occlusion and tortuosity in the vessel.    Assessment/Plan:  75 year old male status post left carotid endarterectomy on 04/07/2020 for an asymptomatic high-grade stenosis in the setting of known right ICA occlusion.  Overall I think he is doing well and on carotid surveillance today it would suggest he has a recurrent moderate 60-79% stenosis on the left.  I reviewed his duplex imaging and I suspect his velocities are overestimated given a contralateral occlusion and also tortuosity in the vessel itself  noted on duplex that can all factor into elevating velocities.  I discussed that he take a 81 mg aspirin daily from my standpoint for overall risk reduction.  I will see him in 6 months with carotid duplex for continued surveillance   Marty Heck, MD [facility]: [phone]

## 2021-02-12 ENCOUNTER — Other Ambulatory Visit: Payer: Self-pay

## 2021-02-12 DIAGNOSIS — I6523 Occlusion and stenosis of bilateral carotid arteries: Secondary | ICD-10-CM

## 2021-02-23 DIAGNOSIS — I130 Hypertensive heart and chronic kidney disease with heart failure and stage 1 through stage 4 chronic kidney disease, or unspecified chronic kidney disease: Secondary | ICD-10-CM | POA: Diagnosis not present

## 2021-02-23 DIAGNOSIS — N1831 Chronic kidney disease, stage 3a: Secondary | ICD-10-CM | POA: Diagnosis not present

## 2021-02-23 DIAGNOSIS — D696 Thrombocytopenia, unspecified: Secondary | ICD-10-CM | POA: Diagnosis not present

## 2021-02-23 DIAGNOSIS — R7301 Impaired fasting glucose: Secondary | ICD-10-CM | POA: Diagnosis not present

## 2021-02-23 DIAGNOSIS — Z0001 Encounter for general adult medical examination with abnormal findings: Secondary | ICD-10-CM | POA: Diagnosis not present

## 2021-02-28 DIAGNOSIS — E782 Mixed hyperlipidemia: Secondary | ICD-10-CM | POA: Diagnosis not present

## 2021-02-28 DIAGNOSIS — K219 Gastro-esophageal reflux disease without esophagitis: Secondary | ICD-10-CM | POA: Diagnosis not present

## 2021-02-28 DIAGNOSIS — I10 Essential (primary) hypertension: Secondary | ICD-10-CM | POA: Diagnosis not present

## 2021-02-28 DIAGNOSIS — I5032 Chronic diastolic (congestive) heart failure: Secondary | ICD-10-CM | POA: Diagnosis not present

## 2021-02-28 DIAGNOSIS — N1831 Chronic kidney disease, stage 3a: Secondary | ICD-10-CM | POA: Diagnosis not present

## 2021-02-28 DIAGNOSIS — I2510 Atherosclerotic heart disease of native coronary artery without angina pectoris: Secondary | ICD-10-CM | POA: Diagnosis not present

## 2021-02-28 DIAGNOSIS — D696 Thrombocytopenia, unspecified: Secondary | ICD-10-CM | POA: Diagnosis not present

## 2021-02-28 DIAGNOSIS — R7301 Impaired fasting glucose: Secondary | ICD-10-CM | POA: Diagnosis not present

## 2021-02-28 DIAGNOSIS — M109 Gout, unspecified: Secondary | ICD-10-CM | POA: Diagnosis not present

## 2021-02-28 DIAGNOSIS — D649 Anemia, unspecified: Secondary | ICD-10-CM | POA: Diagnosis not present

## 2021-02-28 DIAGNOSIS — R944 Abnormal results of kidney function studies: Secondary | ICD-10-CM | POA: Diagnosis not present

## 2021-02-28 DIAGNOSIS — I6523 Occlusion and stenosis of bilateral carotid arteries: Secondary | ICD-10-CM | POA: Diagnosis not present

## 2021-03-01 ENCOUNTER — Other Ambulatory Visit: Payer: Self-pay | Admitting: Cardiology

## 2021-04-12 DIAGNOSIS — I10 Essential (primary) hypertension: Secondary | ICD-10-CM | POA: Diagnosis not present

## 2021-04-12 DIAGNOSIS — K219 Gastro-esophageal reflux disease without esophagitis: Secondary | ICD-10-CM | POA: Diagnosis not present

## 2021-04-13 DIAGNOSIS — M5431 Sciatica, right side: Secondary | ICD-10-CM | POA: Diagnosis not present

## 2021-04-27 ENCOUNTER — Ambulatory Visit: Payer: Medicare Other | Admitting: Cardiology

## 2021-05-12 DIAGNOSIS — M5431 Sciatica, right side: Secondary | ICD-10-CM | POA: Diagnosis not present

## 2021-05-25 ENCOUNTER — Other Ambulatory Visit: Payer: Self-pay

## 2021-05-25 MED ORDER — ATORVASTATIN CALCIUM 20 MG PO TABS: 20.0000 mg | ORAL_TABLET | Freq: Every day | ORAL | 9 refills | Status: DC

## 2021-06-22 ENCOUNTER — Other Ambulatory Visit: Payer: Self-pay | Admitting: Cardiology

## 2021-06-30 ENCOUNTER — Encounter: Payer: Self-pay | Admitting: Cardiology

## 2021-06-30 ENCOUNTER — Ambulatory Visit: Payer: Medicare Other | Admitting: Cardiology

## 2021-06-30 ENCOUNTER — Other Ambulatory Visit: Payer: Self-pay

## 2021-06-30 VITALS — BP 152/78 | HR 85 | Ht 70.0 in | Wt 191.0 lb

## 2021-06-30 DIAGNOSIS — I25119 Atherosclerotic heart disease of native coronary artery with unspecified angina pectoris: Secondary | ICD-10-CM

## 2021-06-30 DIAGNOSIS — I484 Atypical atrial flutter: Secondary | ICD-10-CM

## 2021-06-30 DIAGNOSIS — N1832 Chronic kidney disease, stage 3b: Secondary | ICD-10-CM

## 2021-06-30 IMAGING — CT CT ANGIO NECK
1 of 7 series · 7 of 33 positions shown · IV contrast (omnipaque)
Comparison: 05/05/2019

CLINICAL DATA: Carotid artery stenosis

EXAM:
CT ANGIOGRAPHY NECK
TECHNIQUE: Multidetector CT imaging of the neck was performed using the
standard protocol during bolus administration of intravenous
contrast. Multiplanar CT image reconstructions and MIPs were
obtained to evaluate the vascular anatomy. Carotid stenosis
measurements (when applicable) are obtained utilizing NASCET
criteria, using the distal internal carotid diameter as the
denominator.
CONTRAST:  60mL OMNIPAQUE IOHEXOL 350 MG/ML SOLN

[Series 6: ax thin · axial · 0.39mm/px · z∈[-18,+191]mm · 7 of 292 slices shown]
[im 37/292  soft-tissue]
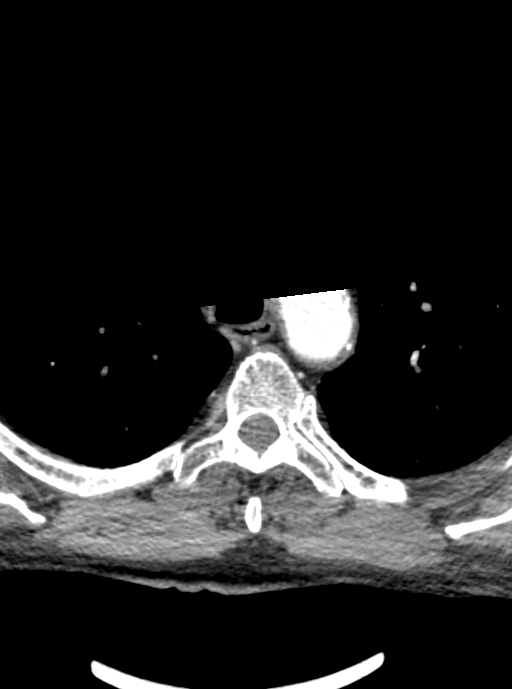
[im 73/292  bone]
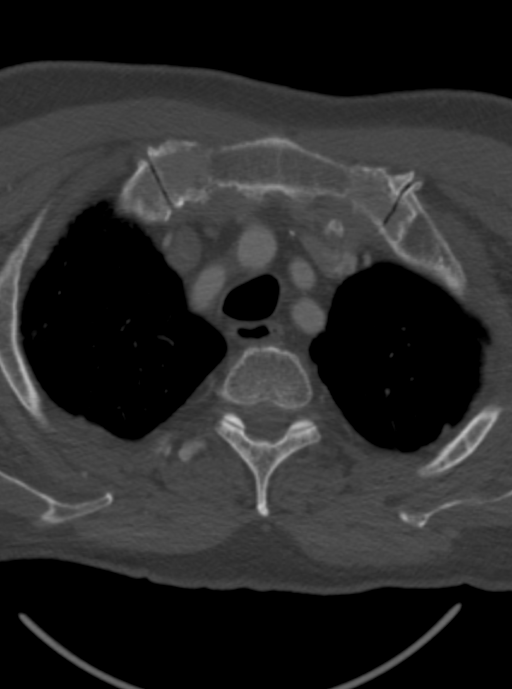
[im 110/292  soft-tissue]
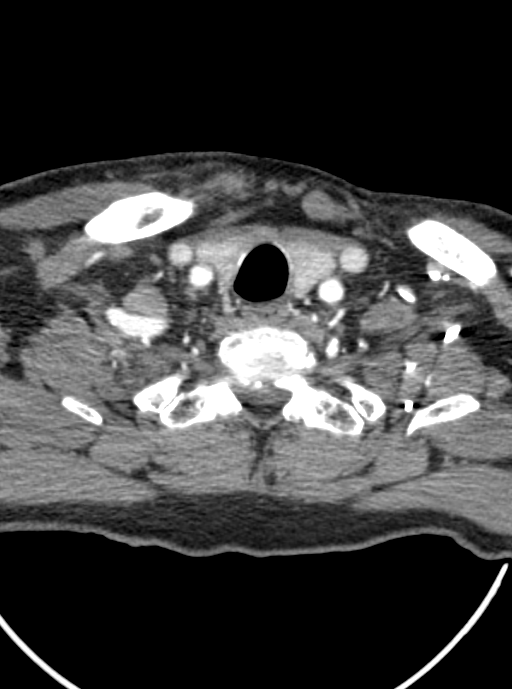
[im 146/292  bone]
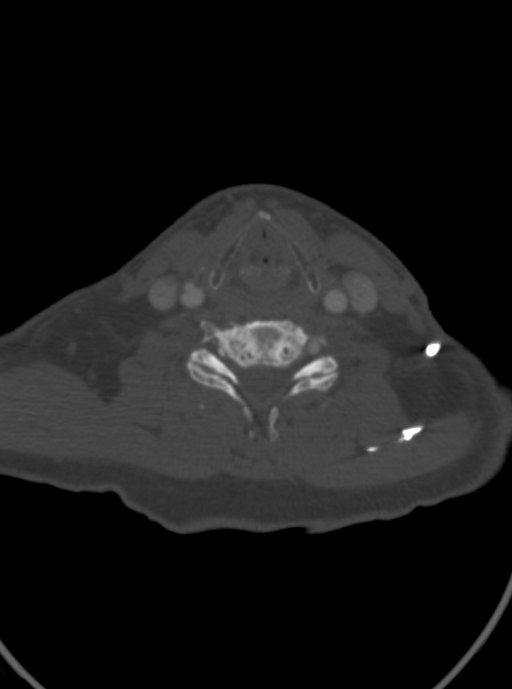
[im 182/292  soft-tissue]
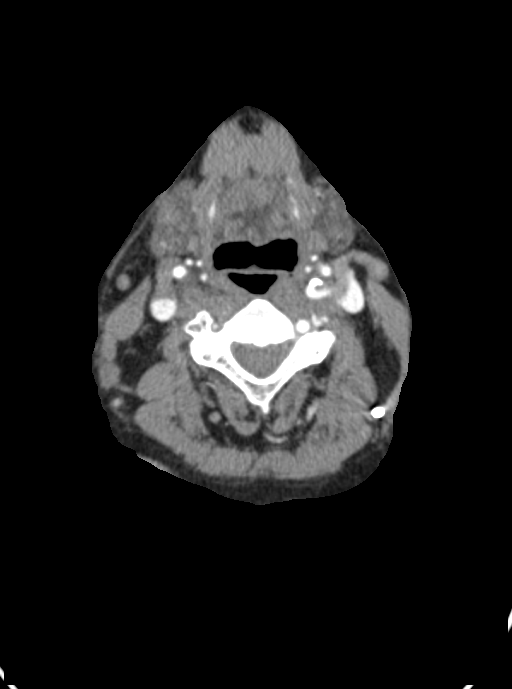
[im 219/292  bone]
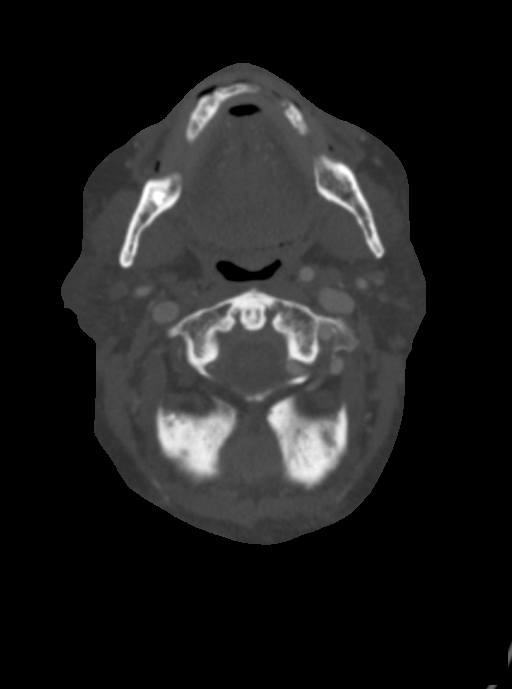
[im 255/292  soft-tissue]
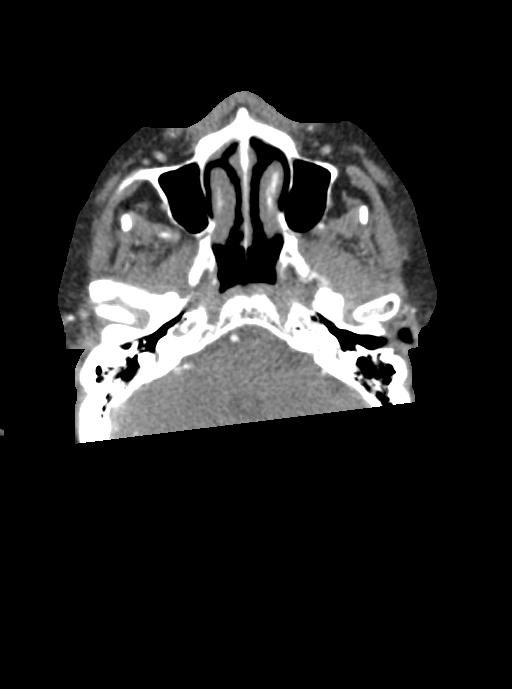

[7 of 33 positions shown; findings below may reference images not displayed]

FINDINGS: Aortic arch: Similar appearance of calcified and noncalcified plaque
along the imaged aortic arch. Great vessel origins remain patent. As
before, there is irregular plaque or localized dissection at the
innominate origin.

Right carotid system: Common carotid is patent with stable
shelf-like intraluminal projection at its midportion, which may
reflect irregular plaque. There is calcified and noncalcified plaque
at the bifurcation with occlusion of the ICA at the origin. There is
new no reconstitution in neck or intracranially imaged to the level
of the proximal cavernous segment. External carotid is patent.
Appearance is unchanged.

Left carotid system: Patent. There is calcified and noncalcified
plaque at the bifurcation and along the proximal internal carotid.
Minimal diameter measures 2 mm on today's study (previously 2.5 mm).
Distal diameter is again 5 mm resulting in a 60% stenosis.

Vertebral arteries: Dominant left vertebral artery is patent with
minimal calcified plaque. Probable high-grade stenosis at the right
vertebral artery origin. There is diminished right vertebral
opacification with more complete reconstitution at the level of the
C2 vertebral body by a muscular branch. The intracranial vertebral
arteries are patent. Appearance is unchanged.

Skeleton: Multilevel degenerative changes of the cervical spine.

Other neck: No mass or adenopathy.

Upper chest: Emphysema.  No apical lung mass.
IMPRESSION: Increased stenosis at the left ICA origin, now measuring 60%
(previously 50%).

Chronic right ICA occlusion.

Chronic presumed high-grade stenosis at the right vertebral origin
with reconstitution distally.

Stable appearance of localized dissection or irregular plaque at the
innominate artery origin.

## 2021-06-30 NOTE — Patient Instructions (Signed)

## 2021-06-30 NOTE — Progress Notes (Signed)
Cardiology Office Note  Date: 06/30/2021   ID: Derek, [name] [birth year], MRN [id number]  PCP:  Celene Squibb, MD  Cardiologist:  [name], MD Electrophysiologist:  None   Chief Complaint  Patient presents with   Cardiac follow-up     History of Present Illness: Derek [name] is a 75 y.o. male last seen in February.  He is here for a routine visit.  Reports less of a sense of palpitations, no angina symptoms with typical activity.  CHA2DS2-VASc score is 4 in the setting of atypical atrial flutter. I recommended switching from aspirin to Eliquis after reviewing his lab work from earlier this year.  He has elected not to take anticoagulation and remains on aspirin.  His main concern is bleeding risk, he still does carpentry work using tools and saws.  He continues to follow with Dr. Nevada Crane.  I reviewed his current medications which are noted below.  Past Medical History:  Diagnosis Date   Anxiety    Arthritis    Atypical atrial flutter (HCC)    Carotid artery disease (HCC)    Cataract    Chronic idiopathic thrombocytopenia (HCC)    Chronic renal insufficiency    Single kidney   Coronary atherosclerosis of native coronary vessel    DES RCA 2007   Depression    Essential hypertension    GERD (gastroesophageal reflux disease)    Hx of colonic polyps 01/01/2015   Mixed hyperlipidemia    Myocardial infarction [facility]) 2006-07-05   STEMI    Peripheral vascular disease [facility])     Past Surgical History:  Procedure Laterality Date   CARDIAC CATHETERIZATION  2010   Patent stent and nonobsturctive cad following an abnormal cardiolite study may of 2010 with an apical lateral defect   CARPAL TUNNEL RELEASE  11/28/2012   Procedure: CARPAL TUNNEL RELEASE;  Surgeon: Jessy Oto, MD;  Location: [place];  Service: Orthopedics;  Laterality: Left;  Left anterior submuscular transposition of ulnar nerve at elbow, left open carpal tunnel release   COLONOSCOPY     ELBOW SURGERY  Left 2013   ENDARTERECTOMY Left 04/07/2020   Procedure: LEFT CAROTID ENDARTERECTOMY;  Surgeon: Marty Heck, MD;  Location: [place];  Service: Vascular;  Laterality: Left;   [place] REMOVAL  03/20/2012   Procedure: HARDWARE REMOVAL;  Surgeon: Jessy Oto, MD;  Location: [place];  Service: Orthopedics;  Laterality: Left;  Hardware removal 3 knowles pins left hip   HERNIA REPAIR     JOINT REPLACEMENT     PATCH ANGIOPLASTY Left 04/07/2020   Procedure: Patch Angioplasty Left Carotid;  Surgeon: Marty Heck, MD;  Location: [place];  Service: Vascular;  Laterality: Left;   TONSILLECTOMY     TOTAL HIP ARTHROPLASTY  03/20/2012   Procedure: TOTAL HIP ARTHROPLASTY;  Surgeon: Jessy Oto, MD;  Location: [place];  Service: Orthopedics;  Laterality: Left;  Left total hip replacement, ceramic on poly    Current Outpatient Medications  Medication Sig Dispense Refill   allopurinol (ZYLOPRIM) 100 MG tablet Take 200 mg by mouth 2 (two) times daily.      atorvastatin (LIPITOR) 20 MG tablet Take 1 tablet (20 mg total) by mouth daily. 30 tablet 9   diazepam (VALIUM) 2 MG tablet Take 2 mg by mouth 2 (two) times a day.  furosemide (LASIX) 40 MG tablet Take 40 mg by mouth daily.     hydrALAZINE (APRESOLINE) 25 MG tablet TAKE 1 TABLET BY MOUTH THREE TIMES DAILY 180 tablet 2   metoprolol succinate (TOPROL-XL) 25 MG 24 hr tablet Take 25 mg by mouth daily.      Omega-3 Fatty Acids (FISH OIL MAXIMUM STRENGTH) 1200 MG CAPS Take 2,400 mg by mouth daily.     omeprazole (PRILOSEC) 20 MG capsule Take 20 mg by mouth daily.       amLODipine (NORVASC) 2.5 MG tablet Take 1 tablet (2.5 mg total) by mouth at bedtime. (Patient not taking: Reported on 06/30/2021) 90 tablet 3   nitroGLYCERIN (NITROSTAT) 0.4 MG SL tablet Place 1 tablet (0.4 mg total) under the tongue every 5 (five) minutes x 3 doses as needed (if no relief after 3rd dose,  proceed to the ED for an evaluation). For chest pain. If no relief after 3 rd dose, proceed to the ED for an evaluation. (Patient not taking: No sig reported) 25 tablet 3   No current facility-administered medications for this visit.   Allergies:  Acetaminophen and Ibuprofen   ROS: Hearing loss.  Physical Exam: VS:  BP (!) 152/78   Pulse 85   Ht 5\' 10"  (1.778 m)   Wt 191 lb (86.6 kg)   SpO2 95%   BMI 27.41 kg/m , BMI Body mass index is 27.41 kg/m.  Wt Readings from Last 3 Encounters:  06/30/21 191 lb (86.6 kg)  02/10/21 198 lb (89.8 kg)  01/27/21 202 lb (91.6 kg)    General: Elderly male, appears comfortable at rest. HEENT: Conjunctiva and lids normal, wearing a mask. Neck: Supple, no elevated JVP or carotid bruits, no thyromegaly. Lungs: Clear to auscultation, nonlabored breathing at rest. Cardiac: Regular rate and rhythm, no S3 or significant systolic murmur, no pericardial rub. Extremities: No pitting edema.  ECG:  An ECG dated 01/27/2021 was personally reviewed today and demonstrated:  Atypical atrial flutter with variable block and nonspecific ST-T changes.  Recent Labwork: 01/28/2021: BUN 19; Creat 1.57; Hemoglobin 13.5; Platelets 115; Potassium 4.0; Sodium 144   Other Studies Reviewed Today:  Lexiscan Cardiolite 11/04/2015: There was no ST segment deviation noted during stress. Findings consistent with prior inferior/inferolateral myocardial infarction with moderate peri-infarct ischemia. This is an intermediate risk study. The left ventricular ejection fraction is normal (55-65%).  Carotid Dopplers 02/10/2021: Summary:  Right Carotid: Evidence consistent with a total occlusion of the right  ICA. The                 ECA appears >50% stenosed.   Left Carotid: Velocities in the left ICA are consistent with a 60-79%  stenosis.                The ECA appears >50% stenosed. 60-79% (high end of range)  but may                be falsely elevated secondary to vessel  kink and  contralateral                occlusion.   Vertebrals:  Left vertebral artery demonstrates antegrade flow. Right  vertebral               artery demonstrates no discernable flow.  Subclavians: Normal flow hemodynamics were seen in bilateral subclavian               arteries.   Assessment and Plan:  1.  CAD status post  DES to the RCA in 2007.  He reports no active angina with medical therapy for residual disease.  Continue aspirin, Toprol-XL, Norvasc, and Lipitor.  2.  Atypical atrial flutter with CHA2DS2-VASc score of 4.  He has declined anticoagulation as discussed above.  Continue aspirin.  No active palpitations at this time.  3.  CKD stage IIIb, last creatinine 1.57.  Medication Adjustments/Labs and Tests Ordered: Current medicines are reviewed at length with the patient today.  Concerns regarding medicines are outlined above.   Tests Ordered: No orders of the defined types were placed in this encounter.   Medication Changes: No orders of the defined types were placed in this encounter.   Disposition:  Follow up  6 months.  Signed, Satira Sark, MD, [facility] 06/30/2021 2:34 PM    [place] [facility] [address], [place], [place] [address] Phone: [phone]; Fax: [phone]

## 2021-08-12 DIAGNOSIS — K219 Gastro-esophageal reflux disease without esophagitis: Secondary | ICD-10-CM | POA: Diagnosis not present

## 2021-08-12 DIAGNOSIS — I10 Essential (primary) hypertension: Secondary | ICD-10-CM | POA: Diagnosis not present

## 2021-08-18 ENCOUNTER — Other Ambulatory Visit: Payer: Self-pay

## 2021-08-18 ENCOUNTER — Encounter: Payer: Self-pay | Admitting: Emergency Medicine

## 2021-08-18 ENCOUNTER — Ambulatory Visit
Admission: EM | Admit: 2021-08-18 | Discharge: 2021-08-18 | Disposition: A | Discharge status: Home / Self Care | Payer: Medicare Other | Attending: Family Medicine | Admitting: Family Medicine

## 2021-08-18 DIAGNOSIS — M5442 Lumbago with sciatica, left side: Secondary | ICD-10-CM | POA: Diagnosis not present

## 2021-08-18 MED ORDER — PREDNISONE 20 MG PO TABS: 40.0000 mg | ORAL_TABLET | Freq: Every day | ORAL | 0 refills | Status: DC

## 2021-08-18 NOTE — ED Provider Notes (Signed)
[place]   [id number] 08/18/21 Arrival Time: 2025  ASSESSMENT & PLAN:  1. Acute left-sided low back pain with left-sided sciatica    No indication for imaging of back. No neurological abnormalities.  Begin trial of: Meds ordered this encounter  Medications   predniSONE (DELTASONE) 20 MG tablet    Sig: Take 2 tablets (40 mg total) by mouth daily.    Dispense:  10 tablet    Refill:  0   Recommend:  Follow-up Information     Celene Squibb, MD.   Specialty: Internal Medicine Why: If worsening or failing to improve as anticipated. Contact information: [facility] [address] [phone]                Reviewed expectations re: course of current medical issues. Questions answered. Outlined signs and symptoms indicating need for more acute intervention. Patient verbalized understanding. After Visit Summary given.  SUBJECTIVE: History from: patient. Derek [name] is a 75 y.o. male who reports intermittent moderate pain of his left lower back; described as aching; with radiation down left leg . Onset: abrupt. First noted:  2-3 d ago . Injury/trama: no but questions related to repeatedly swinging leg over new lawn mower. Symptoms have stabilized since beginning. Aggravating factors: certain movements. Alleviating factors: have not been identified. Associated symptoms: none reported. Extremity sensation changes or weakness: none. No tx PTA. Normal bowel/bladder habits.  Past Surgical History:  Procedure Laterality Date   CARDIAC CATHETERIZATION  2010   Patent stent and nonobsturctive cad following an abnormal cardiolite study may of 2010 with an apical lateral defect   CARPAL TUNNEL RELEASE  11/28/2012   Procedure: CARPAL TUNNEL RELEASE;  Surgeon: Jessy Oto, MD;  Location: [place];  Service: Orthopedics;  Laterality: Left;  Left anterior submuscular transposition of ulnar nerve at elbow, left open carpal tunnel release   COLONOSCOPY      ELBOW SURGERY Left 2013   ENDARTERECTOMY Left 04/07/2020   Procedure: LEFT CAROTID ENDARTERECTOMY;  Surgeon: Marty Heck, MD;  Location: [place];  Service: Vascular;  Laterality: Left;   [place] REMOVAL  03/20/2012   Procedure: HARDWARE REMOVAL;  Surgeon: Jessy Oto, MD;  Location: [place];  Service: Orthopedics;  Laterality: Left;  Hardware removal 3 knowles pins left hip   HERNIA REPAIR     JOINT REPLACEMENT     PATCH ANGIOPLASTY Left 04/07/2020   Procedure: Patch Angioplasty Left Carotid;  Surgeon: Marty Heck, MD;  Location: [place];  Service: Vascular;  Laterality: Left;   TONSILLECTOMY     TOTAL HIP ARTHROPLASTY  03/20/2012   Procedure: TOTAL HIP ARTHROPLASTY;  Surgeon: Jessy Oto, MD;  Location: [place];  Service: Orthopedics;  Laterality: Left;  Left total hip replacement, ceramic on poly      OBJECTIVE:  Vitals:   08/18/21 0854  BP: (!) 152/78  Pulse: 81  Resp: 19  Temp: 98.3 F (36.8 C)  TempSrc: Oral  SpO2: 95%    General appearance: alert; no distress HEENT: [place]; AT Neck: supple with FROM Resp: unlabored respirations Extremities: moves all extremities normally CV: brisk extremity capillary refill of bilateral LE Skin: warm and dry; no visible rashes Neurologic: gait normal; normal sensation and strength of bilateral LE; SLR + on LEFT Psychological: alert and cooperative; normal mood and affect   Allergies  Allergen Reactions   Acetaminophen Other (See Comments)    One kidney Pt has one Kidney    Ibuprofen Other (See Comments)    One kidney Pt has one Kidney     Past Medical History:  Diagnosis Date   Anxiety    Arthritis    Atypical atrial flutter (HCC)    Carotid artery disease (HCC)    Cataract    Chronic idiopathic thrombocytopenia (HCC)    Chronic renal insufficiency    Single kidney   Coronary atherosclerosis of native coronary vessel    DES  RCA 2007   Depression    Essential hypertension    GERD (gastroesophageal reflux disease)    Hx of colonic polyps 01/01/2015   Mixed hyperlipidemia    Myocardial infarction [facility]) 2006-07-05   STEMI    Peripheral vascular disease ([place])    Social History   Socioeconomic History   Marital status: Married    Spouse name: Not on file   Number of children: Not on file   Years of education: Not on file   Highest education level: Not on file  Occupational History   Not on file  Tobacco Use   Smoking status: Former    Packs/day: 3.00    Years: 42.00    Pack years: 126.00    Types: Cigarettes    Start date: 10/30/1960    Quit date: 07/23/2004    Years since quitting: 17.0   Smokeless tobacco: Never   Tobacco comments:    Counseled to remain smoke free  Vaping Use   Vaping Use: Never used  Substance and Sexual Activity   Alcohol use: No    Alcohol/week: 0.0 standard drinks   Drug use: No   Sexual activity: Not on file  Other Topics Concern   Not on file  Social History Narrative   Works full time.    Social Determinants of Health   Financial Resource Strain: Not on file  Food Insecurity: Not on file  Transportation Needs: Not on file  Physical Activity: Not on file  Stress: Not on file  Social Connections: Not on file   Family History  Problem Relation Age of Onset   Stroke Other    Coronary artery disease Other    Colon cancer Brother        has colostomy    Esophageal cancer Neg Hx    Stomach cancer Neg Hx    Rectal cancer Neg Hx    Colon polyps Neg Hx    Past Surgical History:  Procedure Laterality Date   CARDIAC CATHETERIZATION  2010   Patent stent and nonobsturctive cad following an abnormal cardiolite study may of 2010 with an apical lateral defect   CARPAL TUNNEL RELEASE  11/28/2012   Procedure: CARPAL TUNNEL RELEASE;  Surgeon: Jessy Oto, MD;  Location: [place];  Service: Orthopedics;  Laterality: Left;  Left anterior submuscular transposition of ulnar  nerve at elbow, left open carpal tunnel release   COLONOSCOPY     ELBOW SURGERY Left 2013   ENDARTERECTOMY Left 04/07/2020   Procedure: LEFT CAROTID ENDARTERECTOMY;  Surgeon: Marty Heck, MD;  Location: [place];  Service: Vascular;  Laterality: Left;   [place] REMOVAL  03/20/2012   Procedure: HARDWARE REMOVAL;  Surgeon: Jessy Oto, MD;  Location: [place];  Service: Orthopedics;  Laterality: Left;  Hardware removal  3 knowles pins left hip   HERNIA REPAIR     JOINT REPLACEMENT     PATCH ANGIOPLASTY Left 04/07/2020   Procedure: Patch Angioplasty Left Carotid;  Surgeon: Marty Heck, MD;  Location: [place];  Service: Vascular;  Laterality: Left;   TONSILLECTOMY     TOTAL HIP ARTHROPLASTY  03/20/2012   Procedure: TOTAL HIP ARTHROPLASTY;  Surgeon: Jessy Oto, MD;  Location: [place];  Service: Orthopedics;  Laterality: Left;  Left total hip replacement, ceramic on poly       Vanessa Kick, MD 08/18/21 [phone]

## 2021-08-18 NOTE — ED Triage Notes (Signed)
Burning LT hip pain that radiates down LT leg that started on Saturday morning.

## 2021-08-24 ENCOUNTER — Other Ambulatory Visit: Payer: Self-pay | Admitting: Cardiology

## 2021-08-24 DIAGNOSIS — I10 Essential (primary) hypertension: Secondary | ICD-10-CM | POA: Diagnosis not present

## 2021-08-24 DIAGNOSIS — R079 Chest pain, unspecified: Secondary | ICD-10-CM | POA: Insufficient documentation

## 2021-08-24 DIAGNOSIS — R7301 Impaired fasting glucose: Secondary | ICD-10-CM | POA: Insufficient documentation

## 2021-08-24 DIAGNOSIS — E782 Mixed hyperlipidemia: Secondary | ICD-10-CM | POA: Diagnosis not present

## 2021-08-24 DIAGNOSIS — I4892 Unspecified atrial flutter: Secondary | ICD-10-CM | POA: Diagnosis not present

## 2021-08-24 DIAGNOSIS — M5442 Lumbago with sciatica, left side: Secondary | ICD-10-CM | POA: Diagnosis not present

## 2021-08-24 DIAGNOSIS — M5440 Lumbago with sciatica, unspecified side: Secondary | ICD-10-CM | POA: Insufficient documentation

## 2021-08-31 DIAGNOSIS — M5432 Sciatica, left side: Secondary | ICD-10-CM | POA: Diagnosis not present

## 2021-09-01 ENCOUNTER — Telehealth: Payer: Self-pay | Admitting: *Deleted

## 2021-09-01 NOTE — Telephone Encounter (Signed)
Patient called and stated for "21 days" he had been having pain and swelling of left leg. Patient had experienced this last year however, he wanted to "take care of his carotid surgery". Appointments scheduled 09/04/2021 for reflux study and 09/08/2021 for office visit with Dr Carlis Abbott.  Patient acknowledged understanding.

## 2021-09-03 ENCOUNTER — Other Ambulatory Visit: Payer: Self-pay

## 2021-09-03 ENCOUNTER — Encounter (HOSPITAL_COMMUNITY): Payer: Self-pay | Admitting: Emergency Medicine

## 2021-09-03 ENCOUNTER — Emergency Department (HOSPITAL_COMMUNITY)
Admission: EM | Admit: 2021-09-03 | Discharge: 2021-09-03 | Disposition: A | Discharge status: Home / Self Care | Payer: Medicare Other | Attending: Emergency Medicine | Admitting: Emergency Medicine

## 2021-09-03 ENCOUNTER — Emergency Department (HOSPITAL_COMMUNITY): Payer: Medicare Other

## 2021-09-03 DIAGNOSIS — R1032 Left lower quadrant pain: Secondary | ICD-10-CM | POA: Diagnosis not present

## 2021-09-03 DIAGNOSIS — I214 Non-ST elevation (NSTEMI) myocardial infarction: Secondary | ICD-10-CM | POA: Insufficient documentation

## 2021-09-03 DIAGNOSIS — M79652 Pain in left thigh: Secondary | ICD-10-CM | POA: Diagnosis not present

## 2021-09-03 DIAGNOSIS — R0789 Other chest pain: Secondary | ICD-10-CM | POA: Diagnosis not present

## 2021-09-03 DIAGNOSIS — M79671 Pain in right foot: Secondary | ICD-10-CM | POA: Insufficient documentation

## 2021-09-03 DIAGNOSIS — M79672 Pain in left foot: Secondary | ICD-10-CM | POA: Diagnosis not present

## 2021-09-03 DIAGNOSIS — M549 Dorsalgia, unspecified: Secondary | ICD-10-CM

## 2021-09-03 DIAGNOSIS — R609 Edema, unspecified: Secondary | ICD-10-CM

## 2021-09-03 DIAGNOSIS — Z87891 Personal history of nicotine dependence: Secondary | ICD-10-CM | POA: Diagnosis not present

## 2021-09-03 DIAGNOSIS — R911 Solitary pulmonary nodule: Secondary | ICD-10-CM | POA: Insufficient documentation

## 2021-09-03 DIAGNOSIS — I517 Cardiomegaly: Secondary | ICD-10-CM | POA: Diagnosis not present

## 2021-09-03 DIAGNOSIS — Z96642 Presence of left artificial hip joint: Secondary | ICD-10-CM | POA: Insufficient documentation

## 2021-09-03 DIAGNOSIS — M546 Pain in thoracic spine: Secondary | ICD-10-CM | POA: Diagnosis not present

## 2021-09-03 DIAGNOSIS — R109 Unspecified abdominal pain: Secondary | ICD-10-CM | POA: Diagnosis not present

## 2021-09-03 DIAGNOSIS — R918 Other nonspecific abnormal finding of lung field: Secondary | ICD-10-CM

## 2021-09-03 DIAGNOSIS — I2510 Atherosclerotic heart disease of native coronary artery without angina pectoris: Secondary | ICD-10-CM | POA: Insufficient documentation

## 2021-09-03 DIAGNOSIS — N183 Chronic kidney disease, stage 3 unspecified: Secondary | ICD-10-CM | POA: Diagnosis not present

## 2021-09-03 DIAGNOSIS — R600 Localized edema: Secondary | ICD-10-CM | POA: Insufficient documentation

## 2021-09-03 DIAGNOSIS — Z79899 Other long term (current) drug therapy: Secondary | ICD-10-CM | POA: Diagnosis not present

## 2021-09-03 DIAGNOSIS — M7989 Other specified soft tissue disorders: Secondary | ICD-10-CM | POA: Diagnosis not present

## 2021-09-03 DIAGNOSIS — M545 Low back pain, unspecified: Secondary | ICD-10-CM | POA: Diagnosis not present

## 2021-09-03 DIAGNOSIS — I129 Hypertensive chronic kidney disease with stage 1 through stage 4 chronic kidney disease, or unspecified chronic kidney disease: Secondary | ICD-10-CM | POA: Insufficient documentation

## 2021-09-03 DIAGNOSIS — I10 Essential (primary) hypertension: Secondary | ICD-10-CM | POA: Diagnosis not present

## 2021-09-03 DIAGNOSIS — M7731 Calcaneal spur, right foot: Secondary | ICD-10-CM | POA: Diagnosis not present

## 2021-09-03 DIAGNOSIS — M79606 Pain in leg, unspecified: Secondary | ICD-10-CM

## 2021-09-03 LAB — COMPREHENSIVE METABOLIC PANEL
ALT: 34 U/L (ref 0–44)
AST: 20 U/L (ref 15–41)
Albumin: 3.9 g/dL (ref 3.5–5.0)
Alkaline Phosphatase: 71 U/L (ref 38–126)
Anion gap: 9 (ref 5–15)
BUN: 22 mg/dL (ref 8–23)
CO2: 26 mmol/L (ref 22–32)
Calcium: 9.3 mg/dL (ref 8.9–10.3)
Chloride: 103 mmol/L (ref 98–111)
Creatinine, Ser: 1.43 mg/dL — ABNORMAL HIGH (ref 0.61–1.24)
GFR, Estimated: 51 mL/min — ABNORMAL LOW (ref >60)
Glucose, Bld: 106 mg/dL — ABNORMAL HIGH (ref 70–99)
Potassium: 3.8 mmol/L (ref 3.5–5.1)
Sodium: 138 mmol/L (ref 135–145)
Total Bilirubin: 1.1 mg/dL (ref 0.3–1.2)
Total Protein: 7.7 g/dL (ref 6.5–8.1)

## 2021-09-03 LAB — URINALYSIS, ROUTINE W REFLEX MICROSCOPIC
Bilirubin Urine: NEGATIVE
Glucose, UA: NEGATIVE mg/dL
Hgb urine dipstick: NEGATIVE
Ketones, ur: NEGATIVE mg/dL
Leukocytes,Ua: NEGATIVE
Nitrite: NEGATIVE
Protein, ur: 30 mg/dL — AB
Specific Gravity, Urine: 1.01 (ref 1.005–1.030)
pH: 7 (ref 5.0–8.0)

## 2021-09-03 LAB — URINALYSIS, MICROSCOPIC (REFLEX): RBC / HPF: NONE SEEN RBC/hpf (ref 0–5)

## 2021-09-03 LAB — CBC
HCT: 49.3 % (ref 39.0–52.0)
Hemoglobin: 15.3 g/dL (ref 13.0–17.0)
MCH: 28.4 pg (ref 26.0–34.0)
MCHC: 31 g/dL (ref 30.0–36.0)
MCV: 91.6 fL (ref 80.0–100.0)
Platelets: 125 10*3/uL — ABNORMAL LOW (ref 150–400)
RBC: 5.38 MIL/uL (ref 4.22–5.81)
RDW: 15.8 % — ABNORMAL HIGH (ref 11.5–15.5)
WBC: 6.7 10*3/uL (ref 4.0–10.5)
nRBC: 0 % (ref 0.0–0.2)

## 2021-09-03 LAB — BRAIN NATRIURETIC PEPTIDE: B Natriuretic Peptide: 346 pg/mL — ABNORMAL HIGH (ref 0.0–100.0)

## 2021-09-03 LAB — T4, FREE: Free T4: 1.53 ng/dL — ABNORMAL HIGH (ref 0.61–1.12)

## 2021-09-03 LAB — TSH: TSH: 1.14 u[IU]/mL (ref 0.350–4.500)

## 2021-09-03 LAB — TROPONIN I (HIGH SENSITIVITY)
Troponin I (High Sensitivity): 40 ng/L — ABNORMAL HIGH (ref <18)
Troponin I (High Sensitivity): 39 ng/L — ABNORMAL HIGH (ref <18)

## 2021-09-03 MED ORDER — LIDOCAINE 5 % EX PTCH
1.0000 | MEDICATED_PATCH | CUTANEOUS | Status: DC
  Administered 2021-09-03: 1 via TRANSDERMAL
  Filled 2021-09-03: qty 1

## 2021-09-03 MED ORDER — HYDRALAZINE HCL 25 MG PO TABS
25.0000 mg | ORAL_TABLET | Freq: Once | ORAL | Status: AC
  Administered 2021-09-03: 25 mg via ORAL
  Filled 2021-09-03: qty 1

## 2021-09-03 MED ORDER — ASPIRIN 325 MG PO TABS
325.0000 mg | ORAL_TABLET | Freq: Once | ORAL | Status: AC
  Administered 2021-09-03: 325 mg via ORAL
  Filled 2021-09-03: qty 1

## 2021-09-03 MED ORDER — IOHEXOL 350 MG/ML SOLN
75.0000 mL | Freq: Once | INTRAVENOUS | Status: AC | PRN
  Administered 2021-09-03: 75 mL via INTRAVENOUS

## 2021-09-03 NOTE — ED Provider Notes (Signed)
[facility] EMERGENCY DEPARTMENT Provider Note   CSN: [id number] Arrival date & time: 09/03/21  1103     History Chief Complaint  Patient presents with   Shoulder Pain    Derek [name] is a 75 y.o. male.  This is a 75 y.o. male with significant medical history as below, including atrial flutter (on ASA, did not want to take DOAC), CAD, hypertension, hyperlipidemia who presents to the ED with a myriad of complaints.  Patient reports he was evaluated by PCP approximately 1 month ago secondary to midthoracic back pain.  He was started on oral prednisone.  Patient reports the pain initiated in his left axillary region, mid thoracic.  Radiates down the left side of his body.  He is not experiencing pain to his bilateral feet.  Pain to left lower quadrant of his abdomen, suprepubic region a/w urination.  Difficulty urinating over the past week, incomplete voiding/dribbling.  No saddle paresthesias or overflow/incontinence to bowel or bladder function.  Does have some constipation.  No black stool or blood per rectum.  Also new onset swelling of his bilateral ankles after starting prednisone.  Difficulty sleeping ambulating secondary to bilateral foot pain.  Pain to his back and axillary region described as sharp, stabbing.  No significant improvement with oral steroids.  He has been on them for around 1 month.  No recent trauma or falls, no fevers or chills, no dyspnea, no nausea or vomiting  Cardiologist is Dr Domenic Polite      The history is provided by the patient and the spouse. No language interpreter was used.  Back Pain Location:  Thoracic spine Quality:  Cramping and stabbing Radiates to:  L posterior upper leg, L foot and R foot Pain severity:  Mild Pain is:  Worse during the night Onset quality:  Gradual Duration:  1 month Timing:  Constant Progression:  Worsening Context: not falling   Associated symptoms: abdominal pain and chest pain   Associated symptoms: no fever and no  headaches       Past Medical History:  Diagnosis Date   Anxiety    Arthritis    Atypical atrial flutter (HCC)    Carotid artery disease (HCC)    Cataract    Chronic idiopathic thrombocytopenia (HCC)    Chronic renal insufficiency    Single kidney   Coronary atherosclerosis of native coronary vessel    DES RCA 2007   Depression    Essential hypertension    GERD (gastroesophageal reflux disease)    Hx of colonic polyps 01/01/2015   Mixed hyperlipidemia    Myocardial infarction [facility]) 2006-07-05   STEMI    Peripheral vascular disease [facility])     Patient Active Problem List   Diagnosis Date Noted   Carotid stenosis, asymptomatic, left 04/07/2020   PVD (peripheral vascular disease) ([place]) 09/05/2018   Obstruction of right carotid artery without cerebral infarction 05/27/2017   Carotid disease, bilateral ([place]) 05/27/2017   Hx of colonic polyps 01/01/2015   Cervical pain 06/06/2014   Stiffness in joint 06/06/2014   Cubital tunnel syndrome on left 11/28/2012    Class: Chronic   Carpal tunnel syndrome on left 11/28/2012    Class: Chronic   CKD (chronic kidney disease) stage 3, GFR 30-59 ml/min ([place]) 09/20/2012   Essential hypertension, benign 09/20/2012   Mixed hyperlipidemia 09/03/2009   CAD, NATIVE VESSEL 09/03/2009    Past Surgical History:  Procedure Laterality Date   CARDIAC CATHETERIZATION  2010   Patent stent and nonobsturctive cad  following an abnormal cardiolite study may of 2010 with an apical lateral defect   CARPAL TUNNEL RELEASE  11/28/2012   Procedure: CARPAL TUNNEL RELEASE;  Surgeon: Jessy Oto, MD;  Location: [place];  Service: Orthopedics;  Laterality: Left;  Left anterior submuscular transposition of ulnar nerve at elbow, left open carpal tunnel release   COLONOSCOPY     ELBOW SURGERY Left 2013   ENDARTERECTOMY Left 04/07/2020   Procedure: LEFT CAROTID ENDARTERECTOMY;  Surgeon: Marty Heck, MD;  Location: [place];  Service: Vascular;  Laterality: Left;    [place] REMOVAL  03/20/2012   Procedure: HARDWARE REMOVAL;  Surgeon: Jessy Oto, MD;  Location: [place];  Service: Orthopedics;  Laterality: Left;  Hardware removal 3 knowles pins left hip   HERNIA REPAIR     JOINT REPLACEMENT     PATCH ANGIOPLASTY Left 04/07/2020   Procedure: Patch Angioplasty Left Carotid;  Surgeon: Marty Heck, MD;  Location: [place];  Service: Vascular;  Laterality: Left;   TONSILLECTOMY     TOTAL HIP ARTHROPLASTY  03/20/2012   Procedure: TOTAL HIP ARTHROPLASTY;  Surgeon: Jessy Oto, MD;  Location: [place];  Service: Orthopedics;  Laterality: Left;  Left total hip replacement, ceramic on poly       Family History  Problem Relation Age of Onset   Stroke Other    Coronary artery disease Other    Colon cancer Brother        has colostomy    Esophageal cancer Neg Hx    Stomach cancer Neg Hx    Rectal cancer Neg Hx    Colon polyps Neg Hx     Social History   Tobacco Use   Smoking status: Former    Packs/day: 3.00    Years: 42.00    Pack years: 126.00    Types: Cigarettes    Start date: 10/30/1960    Quit date: 07/23/2004    Years since quitting: 17.1   Smokeless tobacco: Never   Tobacco comments:    Counseled to remain smoke free  Vaping Use   Vaping Use: Never used  Substance Use Topics   Alcohol use: No    Alcohol/week: 0.0 standard drinks   Drug use: No    Home Medications Prior to Admission medications   Medication Sig Start Date End Date Taking? Authorizing Provider  allopurinol (ZYLOPRIM) 100 MG tablet Take 200 mg by mouth 2 (two) times daily.    Yes [provider]  atorvastatin (LIPITOR) 20 MG tablet Take 1 tablet (20 mg total) by mouth daily. 05/25/21  Yes Lorretta Harp, MD  diazepam (VALIUM) 2 MG tablet Take 2 mg by mouth 2 (two) times a day.   Yes [provider]  ferrous sulfate 325 (65 FE) MG tablet Take 325 mg by mouth  daily with breakfast.   Yes [provider]  furosemide (LASIX) 40 MG tablet Take 40 mg by mouth daily.   Yes [provider]  hydrALAZINE (APRESOLINE) 25 MG tablet TAKE 1 TABLET BY MOUTH THREE TIMES DAILY 08/25/21  Yes Satira Sark, MD  metoprolol succinate (TOPROL-XL) 25 MG 24 hr tablet Take 25 mg by mouth daily.    Yes [provider]  Multiple Vitamins-Minerals (PRESERVISION AREDS PO) Take 1 tablet by mouth daily.   Yes [provider]  Omega-3 Fatty Acids ([place]  OIL MAXIMUM STRENGTH) 1200 MG CAPS Take 2,400 mg by mouth daily.   Yes [provider]  omeprazole (PRILOSEC) 20 MG capsule Take 20 mg by mouth daily.     Yes [provider]  vitamin B-12 (CYANOCOBALAMIN) 1000 MCG tablet Take 1,000 mcg by mouth daily.   Yes [provider]  amLODipine (NORVASC) 2.5 MG tablet Take 1 tablet (2.5 mg total) by mouth at bedtime. Patient not taking: Reported on 06/30/2021 07/15/20 10/13/20  Satira Sark, MD  nitroGLYCERIN (NITROSTAT) 0.4 MG SL tablet Place 1 tablet (0.4 mg total) under the tongue every 5 (five) minutes x 3 doses as needed (if no relief after 3rd dose, proceed to the ED for an evaluation). For chest pain. If no relief after 3 rd dose, proceed to the ED for an evaluation. Patient not taking: No sig reported 06/05/18   Satira Sark, MD  predniSONE (DELTASONE) 20 MG tablet Take 2 tablets (40 mg total) by mouth daily. Patient not taking: Reported on 09/03/2021 08/18/21   Vanessa Kick, MD    Allergies    Acetaminophen and Ibuprofen  Review of Systems   Review of Systems  Constitutional:  Negative for chills and fever.  HENT:  Negative for facial swelling and trouble swallowing.   Eyes:  Negative for photophobia and visual disturbance.  Respiratory:  Negative for cough and shortness of breath.   Cardiovascular:  Positive for chest pain and leg swelling. Negative for palpitations.  Gastrointestinal:  Positive for  abdominal pain and constipation. Negative for nausea and vomiting.  Endocrine: Negative for polydipsia and polyuria.  Genitourinary:  Negative for difficulty urinating and hematuria.  Musculoskeletal:  Positive for arthralgias and back pain. Negative for gait problem and joint swelling.  Skin:  Negative for pallor and rash.  Neurological:  Negative for syncope and headaches.  Psychiatric/Behavioral:  Negative for agitation and confusion.    Physical Exam Updated Vital Signs BP (!) 150/94   Pulse 77   Temp 98.7 F (37.1 C) (Oral)   Resp (!) 21   Ht '5\' 10"'  (1.778 m)   Wt 84.4 kg   SpO2 98%   BMI 26.69 kg/m   Physical Exam Vitals and nursing note reviewed.  Constitutional:      General: He is not in acute distress.    Appearance: Normal appearance. He is well-developed.  HENT:     Head: Normocephalic and atraumatic.     Right Ear: External ear normal.     Left Ear: External ear normal.     Mouth/Throat:     Mouth: Mucous membranes are moist.  Eyes:     General: No scleral icterus. Cardiovascular:     Rate and Rhythm: Normal rate. Rhythm regularly irregular.     Pulses: Normal pulses.          Radial pulses are 2+ on the right side and 2+ on the left side.       Dorsalis pedis pulses are 2+ on the right side and 2+ on the left side.     Heart sounds: Normal heart sounds.  Pulmonary:     Effort: Pulmonary effort is normal. No respiratory distress.     Breath sounds: Normal breath sounds.  Abdominal:     General: Abdomen is flat.     Palpations: Abdomen is soft.     Tenderness: There is abdominal tenderness.  Musculoskeletal:        General: Normal range of motion.     Cervical back: Normal  range of motion.     Right lower leg: Edema (3+) present.     Left lower leg: Edema (3+) present.  Skin:    General: Skin is warm and dry.     Capillary Refill: Capillary refill takes less than 2 seconds.  Neurological:     Mental Status: He is alert and oriented to person, place,  and time.     GCS: GCS eye subscore is 4. GCS verbal subscore is 5. GCS motor subscore is 6.     Cranial Nerves: Cranial nerves are intact.     Sensory: Sensation is intact.     Motor: Motor function is intact.     Coordination: Coordination is intact.  Psychiatric:        Mood and Affect: Mood normal.        Behavior: Behavior normal.    ED Results / Procedures / Treatments   Labs (all labs ordered are listed, but only abnormal results are displayed) Labs Reviewed  BRAIN NATRIURETIC PEPTIDE - Abnormal; Notable for the following components:      Result Value   B Natriuretic Peptide 346.0 (*)    All other components within normal limits  CBC - Abnormal; Notable for the following components:   RDW 15.8 (*)    Platelets 125 (*)    All other components within normal limits  COMPREHENSIVE METABOLIC PANEL - Abnormal; Notable for the following components:   Glucose, Bld 106 (*)    Creatinine, Ser 1.43 (*)    GFR, Estimated 51 (*)    All other components within normal limits  URINALYSIS, ROUTINE W REFLEX MICROSCOPIC - Abnormal; Notable for the following components:   Protein, ur 30 (*)    All other components within normal limits  URINALYSIS, MICROSCOPIC (REFLEX) - Abnormal; Notable for the following components:   Bacteria, UA RARE (*)    All other components within normal limits  TROPONIN I (HIGH SENSITIVITY) - Abnormal; Notable for the following components:   Troponin I (High Sensitivity) 40 (*)    All other components within normal limits  TROPONIN I (HIGH SENSITIVITY) - Abnormal; Notable for the following components:   Troponin I (High Sensitivity) 39 (*)    All other components within normal limits  TSH  T4, FREE    EKG EKG Interpretation  Date/Time:  Thursday September 03 2021 11:49:06 EDT Ventricular Rate:  102 PR Interval:    QRS Duration: 101 QT Interval:  371 QTC Calculation: 484 R Axis:   68 Text Interpretation: Atrial flutter RSR' in V1 or V2, right VCD or  RVH Similar to prior tracing Confirmed by Wynona Dove ([id number]) on 09/03/2021 3:33:07 PM  Radiology DG Ankle 2 Views Left  Result Date: 09/03/2021 CLINICAL DATA:  BILATERAL lower extremity edema greater on LEFT EXAM: LEFT ANKLE - 2 VIEW COMPARISON:  None FINDINGS: Marked soft tissue swelling LEFT lower leg and ankle into foot. Mild osseous demineralization. Joint spaces preserved. No acute fracture, dislocation, or bone destruction. IMPRESSION: Soft tissue swelling without acute osseous abnormalities. Electronically Signed   By: Lavonia Dana M.D.   On: 09/03/2021 14:59   DG Ankle 2 Views Right  Result Date: 09/03/2021 CLINICAL DATA:  BILATERAL lower extremity edema greater on LEFT EXAM: RIGHT ANKLE - 2 VIEW COMPARISON:  09/08/2006 FINDINGS: Diffuse soft tissue swelling RIGHT lower leg, ankle, into foot. Mild osseous demineralization. Joint spaces preserved. Tiny plantar calcaneal spur. No acute fracture, dislocation, or bone destruction. IMPRESSION: Soft tissue swelling without acute bony abnormalities. Electronically  Signed   By: Lavonia Dana M.D.   On: 09/03/2021 15:00   CT Thoracic Spine Wo Contrast  Result Date: 09/03/2021 CLINICAL DATA:  Back pain. EXAM: CT THORACIC AND LUMBAR SPINE WITHOUT CONTRAST TECHNIQUE: Multidetector CT imaging of the thoracic and lumbar spine was performed without contrast. Multiplanar CT image reconstructions were also generated. COMPARISON:  Chest CT 01/31/2019 FINDINGS: CT THORACIC SPINE FINDINGS Alignment: Normal alignment of the thoracic vertebral bodies. Vertebrae: Intact.  Mild osteopenia.  No fractures or bone lesions. Paraspinal and other soft tissues: Advanced atherosclerotic calcifications involving the thoracic aorta and coronary arteries. The heart is enlarged. Underlying emphysematous changes are again noted. Small pulmonary nodules appears stable. There is a long band airspace opacification in the right paraspinal region. This could be subpleural atelectasis. I do  not see on the prior CT scan from 2020. Disc levels: Moderate degenerative changes in the mid to lower thoracic spine with mild joint space narrowing and right-sided bridging osteophytes. No acute bony findings or destructive bony changes. No spinal canal compromise. No large disc protrusions are identified. CT LUMBAR SPINE FINDINGS Segmentation: There are five lumbar type vertebral bodies. The last full intervertebral disc space is labeled L5-S1. Alignment: Normal Vertebrae: Osteopenia but no bone lesions or fractures. Paraspinal and other soft tissues: Advanced atherosclerotic calcifications involving the abdominal aorta but no aneurysm. Small right kidney noted with a simple appearing cyst. No retroperitoneal mass or adenopathy. Disc levels: L1-2: No significant findings. L2-3: Significant/severe multifactorial spinal stenosis. There is a broad-based degenerated bulging annulus, short pedicles, facet disease and ligamentum flavum thickening. Moderately severe multifactorial spinal and bilateral lateral recess stenosis at L3-4. There is also left foraminal stenosis. Bulging degenerated and partially calcified disc along with advanced facet disease and ligamentum flavum thickening. L4-5: Bulging degenerated annulus, rim calcified central disc protrusion, advanced facet disease and ligamentum flavum thickening contributing to severe spinal and bilateral lateral recess stenosis and mild bilateral foraminal stenosis. L5-S1: Facet disease but no disc protrusions, spinal or foraminal stenosis. IMPRESSION: 1. Normal alignment of the thoracic and lumbar vertebral bodies and no acute bony findings or destructive bony changes. 2. Degenerative thoracic and lumbar spondylosis with multilevel disc disease and advanced facet disease. 3. Significant/severe multifactorial spinal stenosis at L2-3 and L3-4. 4. Severe spinal and bilateral lateral recess stenosis and mild bilateral foraminal stenosis at L4-5. 5. Right paraspinal  airspace opacity in the lung likely subpleural atelectasis. There are also bilateral pulmonary nodules. Recommend follow-up noncontrast chest CT in 3-4 months to reassess. 6. Stable advanced atherosclerotic calcifications involving the aorta and coronary arteries. 7. Emphysema and pulmonary scarring. Aortic Atherosclerosis (ICD10-I70.0) and Emphysema (ICD10-J43.9). Electronically Signed   By: Marijo Sanes M.D.   On: 09/03/2021 14:08   CT ABDOMEN PELVIS W CONTRAST  Result Date: 09/03/2021 CLINICAL DATA:  Left lower quadrant and suprapubic abdominal pain EXAM: CT ABDOMEN AND PELVIS WITH CONTRAST TECHNIQUE: Multidetector CT imaging of the abdomen and pelvis was performed using the standard protocol following bolus administration of intravenous contrast. CONTRAST:  69m OMNIPAQUE IOHEXOL 350 MG/ML SOLN COMPARISON:  06/28/2002 FINDINGS: Lower chest: Visualized lung bases are clear. Mild cardiomegaly. Moderate multi-vessel coronary artery calcification. No pericardial effusion. Hepatobiliary: No focal liver abnormality is seen. No gallstones, gallbladder wall thickening, or biliary dilatation. Pancreas: Unremarkable Spleen: Mild splenomegaly is stable with the spleen measuring 14.7 cm in greatest dimension. No intrasplenic lesion. The splenic vein is patent. Adrenals/Urinary Tract: The adrenal glands are unremarkable. Marked atrophy of the right kidney has progressed since prior  examination. Simple cortical cyst noted within the interpolar region of the right kidney. The left kidney is normal in size and position. Multiple scattered simple cortical cysts are seen within the left kidney. No hydronephrosis. No intrarenal or ureteral calculi are clearly identified. There is obscuration of the distal left ureter secondary to streak artifact from left total hip arthroplasty. The bladder is partially obscured, however, the visualized segment appears normal. Stomach/Bowel: Severe descending and sigmoid diverticulosis. The  stomach, small bowel, and large bowel are otherwise unremarkable. No evidence of obstruction or focal inflammation. Appendix normal. No free intraperitoneal gas or fluid. Vascular/Lymphatic: Extensive aortoiliac atherosclerotic calcification. No aortic aneurysm. No pathologic adenopathy within the abdomen and pelvis. Reproductive: Partially obscured by streak artifact. The visualized reproductive organs are unremarkable, however. Other: No abdominal wall hernia.  Rectum unremarkable. Musculoskeletal: Left total hip arthroplasty has been performed. No acute bone abnormality. No lytic or blastic bone lesion. Degenerative changes are seen within the lumbar spine. IMPRESSION: No acute intra-abdominal pathology identified. No definite radiographic explanation for the patient's reported symptoms. Stable splenomegaly. Progressive marked atrophy of the right kidney. Distal colonic diverticulosis without superimposed acute inflammatory change. Aortic Atherosclerosis (ICD10-I70.0). Electronically Signed   By: Fidela Salisbury M.D.   On: 09/03/2021 14:43   CT L-SPINE NO CHARGE  Result Date: 09/03/2021 CLINICAL DATA:  Back pain. EXAM: CT THORACIC AND LUMBAR SPINE WITHOUT CONTRAST TECHNIQUE: Multidetector CT imaging of the thoracic and lumbar spine was performed without contrast. Multiplanar CT image reconstructions were also generated. COMPARISON:  Chest CT 01/31/2019 FINDINGS: CT THORACIC SPINE FINDINGS Alignment: Normal alignment of the thoracic vertebral bodies. Vertebrae: Intact.  Mild osteopenia.  No fractures or bone lesions. Paraspinal and other soft tissues: Advanced atherosclerotic calcifications involving the thoracic aorta and coronary arteries. The heart is enlarged. Underlying emphysematous changes are again noted. Small pulmonary nodules appears stable. There is a long band airspace opacification in the right paraspinal region. This could be subpleural atelectasis. I do not see on the prior CT scan from 2020.  Disc levels: Moderate degenerative changes in the mid to lower thoracic spine with mild joint space narrowing and right-sided bridging osteophytes. No acute bony findings or destructive bony changes. No spinal canal compromise. No large disc protrusions are identified. CT LUMBAR SPINE FINDINGS Segmentation: There are five lumbar type vertebral bodies. The last full intervertebral disc space is labeled L5-S1. Alignment: Normal Vertebrae: Osteopenia but no bone lesions or fractures. Paraspinal and other soft tissues: Advanced atherosclerotic calcifications involving the abdominal aorta but no aneurysm. Small right kidney noted with a simple appearing cyst. No retroperitoneal mass or adenopathy. Disc levels: L1-2: No significant findings. L2-3: Significant/severe multifactorial spinal stenosis. There is a broad-based degenerated bulging annulus, short pedicles, facet disease and ligamentum flavum thickening. Moderately severe multifactorial spinal and bilateral lateral recess stenosis at L3-4. There is also left foraminal stenosis. Bulging degenerated and partially calcified disc along with advanced facet disease and ligamentum flavum thickening. L4-5: Bulging degenerated annulus, rim calcified central disc protrusion, advanced facet disease and ligamentum flavum thickening contributing to severe spinal and bilateral lateral recess stenosis and mild bilateral foraminal stenosis. L5-S1: Facet disease but no disc protrusions, spinal or foraminal stenosis. IMPRESSION: 1. Normal alignment of the thoracic and lumbar vertebral bodies and no acute bony findings or destructive bony changes. 2. Degenerative thoracic and lumbar spondylosis with multilevel disc disease and advanced facet disease. 3. Significant/severe multifactorial spinal stenosis at L2-3 and L3-4. 4. Severe spinal and bilateral lateral recess stenosis and mild bilateral foraminal  stenosis at L4-5. 5. Right paraspinal airspace opacity in the lung likely  subpleural atelectasis. There are also bilateral pulmonary nodules. Recommend follow-up noncontrast chest CT in 3-4 months to reassess. 6. Stable advanced atherosclerotic calcifications involving the aorta and coronary arteries. 7. Emphysema and pulmonary scarring. Aortic Atherosclerosis (ICD10-I70.0) and Emphysema (ICD10-J43.9). Electronically Signed   By: Marijo Sanes M.D.   On: 09/03/2021 14:08   DG Chest Port 1 View  Result Date: 09/03/2021 CLINICAL DATA:  Right-side chest pain, lower extremity edema EXAM: PORTABLE CHEST 1 VIEW COMPARISON:  Portable exam 1403 hours compared to 01/08/2015 FINDINGS: Enlargement of cardiac silhouette. Mediastinal contours and pulmonary vascularity normal. Lungs clear. No pulmonary infiltrate, pleural effusion, or pneumothorax. Atherosclerotic calcifications aorta noted. Bones demineralized. IMPRESSION: Enlargement of cardiac silhouette. No acute abnormalities. Aortic Atherosclerosis (ICD10-I70.0). Electronically Signed   By: Lavonia Dana M.D.   On: 09/03/2021 14:57   DG Foot 2 Views Left  Result Date: 09/03/2021 CLINICAL DATA:  BILATERAL lower extremity swelling greater on LEFT EXAM: LEFT FOOT - 2 VIEW COMPARISON:  None FINDINGS: Diffuse soft tissue swelling LEFT ankle and foot, especially dorsum of LEFT foot. Bony demineralization. Joint spaces preserved. No acute fracture, dislocation, or bone destruction. IMPRESSION: Marked soft tissue swelling at dorsum of LEFT foot. No acute osseous abnormalities. Electronically Signed   By: Lavonia Dana M.D.   On: 09/03/2021 15:01   DG Foot 2 Views Right  Result Date: 09/03/2021 CLINICAL DATA:  BILATERAL lower extremity edema greater on LEFT EXAM: RIGHT FOOT - 2 VIEW COMPARISON:  None FINDINGS: Osseous demineralization. Diffuse soft tissue swelling greatest at dorsum of foot. Tiny plantar calcaneal spur. No acute fracture, dislocation, or bone destruction. IMPRESSION: Significant soft tissue swelling RIGHT foot especially dorsally.  No acute osseous abnormalities. Electronically Signed   By: Lavonia Dana M.D.   On: 09/03/2021 15:02    Procedures .Critical Care Performed by: Jeanell Sparrow, DO Authorized by: Jeanell Sparrow, DO   Critical care provider statement:    Critical care time (minutes):  32   Critical care time was exclusive of:  Separately billable procedures and treating other patients   Critical care was necessary to treat or prevent imminent or life-threatening deterioration of the following conditions:  Cardiac failure   Critical care was time spent personally by me on the following activities:  Discussions with consultants, evaluation of patient's response to treatment, examination of patient, ordering and performing treatments and interventions, ordering and review of laboratory studies, ordering and review of radiographic studies, pulse oximetry, re-evaluation of patient's condition, obtaining history from patient or surrogate and review of old charts   Medications Ordered in ED Medications  lidocaine (LIDODERM) 5 % 1 patch (1 patch Transdermal Patch Applied 09/03/21 1516)  aspirin tablet 325 mg (has no administration in time range)  iohexol (OMNIPAQUE) 350 MG/ML injection 75 mL (75 mLs Intravenous Contrast Given 09/03/21 1322)  hydrALAZINE (APRESOLINE) tablet 25 mg (25 mg Oral Given 09/03/21 1516)    ED Course  I have reviewed the triage vital signs and the nursing notes.  Pertinent labs & imaging results that were available during my care of the patient were reviewed by me and considered in my medical decision making (see chart for details).    MDM Rules/Calculators/A&P                           This patient complains of a myriad of complaints; this involves an extensive number of  treatment Options and is a complaint that carries with it a high risk of complications and morbidity. Vital signs reviewed, mildly hypertensive, did not take afternoon bp meds. Mild CP. Serious etiologies considered.     I ordered, reviewed and interpreted labs -he has troponin leak, EKG similar to prior tracing, no acute ischemia. He has ongoing chest pain. CXR non-acute. Will give ASA. Heart score is 5.  BNP elevated 346, he has LE edema but no signs of overt fluid overload on CXR. LE edema is prominent and patient feels started in concordance with steroids initiation.   Cr is similar to his baseline, he has singleton kidney.    XR reviewed and is stable. He has swelling to b/l feet but no bony abnormality. Swelling is equal to b/l LE. Low suspicion for acute DVT, well's score is low.    CT reviewed, does have some significant bony abnormalities that appear chronic in nature. CT abdomen is non-acute. Pulmonary nodules noted on imaging.  Patient signed out to incoming physician Dr Sabra Heck. [place] admission at this time given elevated heart score of 5 and troponin leak. Given asa.    Final Clinical Impression(s) / ED Diagnoses Final diagnoses:  Back pain  NSTEMI (non-ST elevated myocardial infarction) [facility])  Pulmonary nodules    Rx / DC Orders ED Discharge Orders     None        Jeanell Sparrow, DO 09/03/21 1748

## 2021-09-03 NOTE — Discharge Instructions (Addendum)
Repeat CT in 3-4 mos regarding pulmonary nodules  Please talk to your doctor about your results and share with him the CT scan findings.  Make sure that you are not taking any prednisone   Pick up some compression socks from the pharmacy  ER for worsening symptoms

## 2021-09-03 NOTE — ED Notes (Signed)
Pt noted with 4+ pitting edema to bilateral lower extremities starting mid-shin and extending through the foot. Feet warm to touch, no weeping noted. Left foot more edematous than right. States he never had swelling before until he was started on prednisone about one month ago. Pt states he was seen previously for his right shoulder and hip pain and was told it was sciatica. Doesn't feel prednisone has helped

## 2021-09-03 NOTE — ED Triage Notes (Signed)
Pt c/o left shoulder pain and left hip pain that goes down leg to foot. Pt c/o bilateral feet swelling, left worse than right.

## 2021-09-03 NOTE — ED Provider Notes (Signed)
Medical screening examination/treatment/procedure(s) were conducted as a shared visit with non-physician practitioner(s) and myself.  I personally evaluated the patient during the encounter.  Clinical Impression:   Final diagnoses:  Back pain  NSTEMI (non-ST elevated myocardial infarction) [facility])  Pulmonary nodules    This patient is a 75 year old male that presented with multiple complaints including swelling of his bilateral lower extremities, chronic back pain, he states that he has not had any chest pain or shortness of breath for me, he reports that he had been on prednisone because of sciatica in his back, that has completely resolved but he has now developed swelling of his bilateral lower extremities and starting it a couple of weeks ago.  He has now stopped the prednisone and is on a diuretic and is supposed to follow-up with the vascular surgeon tomorrow for an ultrasound of his legs.  At this time the patient is feeling better, he has no complaints on exam, he is able to lift both legs has good sensation, clear heart and lung sounds, no rales no wheezing normal oxygen level.  He does have atrial flutter which is seen on the monitor with a rate of 100 bpm but has not had his evening medications including for his blood pressure.  It is noted that he is hypertensive, he has been giving some Addison's here and has medicines at home to take his own as he gets there.  The patient is aware of the pulmonary nodules, he states he has had them in the past but will share the imaging report with his doctor which I will print and give to him.  His troponin has been flat he has no chest pain and his BNP is only 346.  He is stable for discharge at this time and requests no inpatient treatment or stabilizing care at this time.   Noemi Chapel, MD 09/03/21 [phone]

## 2021-09-03 NOTE — ED Notes (Signed)
BP elevated, Dr. Sabra Heck notified.

## 2021-09-04 ENCOUNTER — Other Ambulatory Visit: Payer: Self-pay

## 2021-09-04 ENCOUNTER — Ambulatory Visit (HOSPITAL_COMMUNITY)
Admission: RE | Admit: 2021-09-04 | Discharge: 2021-09-04 | Disposition: A | Discharge status: Home / Self Care | Payer: Medicare Other | Source: Ambulatory Visit | Attending: Vascular Surgery | Admitting: Vascular Surgery

## 2021-09-04 DIAGNOSIS — M79605 Pain in left leg: Secondary | ICD-10-CM

## 2021-09-04 DIAGNOSIS — M79606 Pain in leg, unspecified: Secondary | ICD-10-CM | POA: Insufficient documentation

## 2021-09-04 DIAGNOSIS — D696 Thrombocytopenia, unspecified: Secondary | ICD-10-CM | POA: Insufficient documentation

## 2021-09-05 DIAGNOSIS — D696 Thrombocytopenia, unspecified: Secondary | ICD-10-CM | POA: Diagnosis not present

## 2021-09-05 DIAGNOSIS — R7301 Impaired fasting glucose: Secondary | ICD-10-CM | POA: Diagnosis not present

## 2021-09-05 DIAGNOSIS — E782 Mixed hyperlipidemia: Secondary | ICD-10-CM | POA: Diagnosis not present

## 2021-09-05 DIAGNOSIS — I10 Essential (primary) hypertension: Secondary | ICD-10-CM | POA: Diagnosis not present

## 2021-09-05 DIAGNOSIS — M545 Low back pain, unspecified: Secondary | ICD-10-CM | POA: Insufficient documentation

## 2021-09-05 DIAGNOSIS — N189 Chronic kidney disease, unspecified: Secondary | ICD-10-CM | POA: Diagnosis not present

## 2021-09-05 DIAGNOSIS — M7989 Other specified soft tissue disorders: Secondary | ICD-10-CM | POA: Diagnosis not present

## 2021-09-07 DIAGNOSIS — M25471 Effusion, right ankle: Secondary | ICD-10-CM | POA: Insufficient documentation

## 2021-09-07 DIAGNOSIS — M25472 Effusion, left ankle: Secondary | ICD-10-CM | POA: Insufficient documentation

## 2021-09-08 ENCOUNTER — Ambulatory Visit: Payer: Medicare Other | Admitting: Vascular Surgery

## 2021-09-08 ENCOUNTER — Encounter: Payer: Self-pay | Admitting: Vascular Surgery

## 2021-09-08 ENCOUNTER — Other Ambulatory Visit: Payer: Self-pay

## 2021-09-08 DIAGNOSIS — M7989 Other specified soft tissue disorders: Secondary | ICD-10-CM

## 2021-09-08 NOTE — Progress Notes (Signed)
Patient name: Derek [name] MRN: [id number] DOB: 1946/07/16 Sex: male  REASON FOR VISIT: Left leg swelling  HPI: Derek [name] is a 75 y.o. male who presents for evaluation of left leg swelling.  He states this started about 19 days ago when he started steroids for sciatica.  He has had marked swelling in both lower extremities worse on the left.  He has tried wearing compression stockings that his wife bought on the Internet.  No history of DVT.  He is well-known to our practice due to carotid artery disease.  He has a known right carotid artery occlusion.  He most recently underwent a left carotid endarterectomy on 04/07/2020 for an asymptomatic high-grade stenosis.    Past Medical History:  Diagnosis Date   Anxiety    Arthritis    Atypical atrial flutter (HCC)    Carotid artery disease (HCC)    Cataract    Chronic idiopathic thrombocytopenia (HCC)    Chronic renal insufficiency    Single kidney   Coronary atherosclerosis of native coronary vessel    DES RCA 2007   Depression    Essential hypertension    GERD (gastroesophageal reflux disease)    Hx of colonic polyps 01/01/2015   Mixed hyperlipidemia    Myocardial infarction [facility]) 2006-07-05   STEMI    Peripheral vascular disease [facility])     Past Surgical History:  Procedure Laterality Date   CARDIAC CATHETERIZATION  2010   Patent stent and nonobsturctive cad following an abnormal cardiolite study may of 2010 with an apical lateral defect   CARPAL TUNNEL RELEASE  11/28/2012   Procedure: CARPAL TUNNEL RELEASE;  Surgeon: Jessy Oto, MD;  Location: [place];  Service: Orthopedics;  Laterality: Left;  Left anterior submuscular transposition of ulnar nerve at elbow, left open carpal tunnel release   COLONOSCOPY     ELBOW SURGERY Left 2013   ENDARTERECTOMY Left 04/07/2020   Procedure: LEFT CAROTID ENDARTERECTOMY;  Surgeon: Marty Heck, MD;  Location: [place];  Service: Vascular;  Laterality: Left;   [place] REMOVAL  03/20/2012   Procedure: HARDWARE REMOVAL;  Surgeon: Jessy Oto, MD;  Location: [place];  Service: Orthopedics;  Laterality: Left;  Hardware removal 3 knowles pins left hip   HERNIA REPAIR     JOINT REPLACEMENT     PATCH ANGIOPLASTY Left 04/07/2020   Procedure: Patch Angioplasty Left Carotid;  Surgeon: Marty Heck, MD;  Location: [place];  Service: Vascular;  Laterality: Left;   TONSILLECTOMY     TOTAL HIP ARTHROPLASTY  03/20/2012   Procedure: TOTAL HIP ARTHROPLASTY;  Surgeon: Jessy Oto, MD;  Location: [place];  Service: Orthopedics;  Laterality: Left;  Left total hip replacement, ceramic on poly    Family History  Problem Relation Age of Onset   Stroke Other    Coronary artery disease Other    Colon cancer Brother        has colostomy    Esophageal cancer Neg Hx    Stomach cancer Neg Hx    Rectal cancer Neg Hx    Colon polyps Neg Hx     SOCIAL HISTORY: Social History   Tobacco Use   Smoking status: Former    Packs/day: 3.00    Years: 42.00    Pack years: 126.00    Types: Cigarettes    Start  date: 10/30/1960    Quit date: 07/23/2004    Years since quitting: 17.1   Smokeless tobacco: Never   Tobacco comments:    Counseled to remain smoke free  Substance Use Topics   Alcohol use: No    Alcohol/week: 0.0 standard drinks    Allergies  Allergen Reactions   Acetaminophen Other (See Comments)    One kidney Pt has one Kidney    Ibuprofen Other (See Comments)    One kidney Pt has one Kidney     Current Outpatient Medications  Medication Sig Dispense Refill   allopurinol (ZYLOPRIM) 100 MG tablet Take 200 mg by mouth 2 (two) times daily.      atorvastatin (LIPITOR) 20 MG tablet Take 1 tablet (20 mg total) by mouth daily. 30 tablet 9   diazepam (VALIUM) 2 MG tablet Take 2 mg by mouth 2 (two) times a day.     ferrous sulfate 325 (65 FE) MG tablet Take 325 mg by mouth daily with breakfast.      furosemide (LASIX) 40 MG tablet Take 40 mg by mouth daily.     hydrALAZINE (APRESOLINE) 25 MG tablet TAKE 1 TABLET BY MOUTH THREE TIMES DAILY 180 tablet 2   metoprolol succinate (TOPROL-XL) 25 MG 24 hr tablet Take 25 mg by mouth daily.      Multiple Vitamins-Minerals (PRESERVISION AREDS PO) Take 1 tablet by mouth daily.     Omega-3 Fatty Acids (FISH OIL MAXIMUM STRENGTH) 1200 MG CAPS Take 2,400 mg by mouth daily.     omeprazole (PRILOSEC) 20 MG capsule Take 20 mg by mouth daily.       vitamin B-12 (CYANOCOBALAMIN) 1000 MCG tablet Take 1,000 mcg by mouth daily.     amLODipine (NORVASC) 2.5 MG tablet Take 1 tablet (2.5 mg total) by mouth at bedtime. (Patient not taking: Reported on 06/30/2021) 90 tablet 3   nitroGLYCERIN (NITROSTAT) 0.4 MG SL tablet Place 1 tablet (0.4 mg total) under the tongue every 5 (five) minutes x 3 doses as needed (if no relief after 3rd dose, proceed to the ED for an evaluation). For chest pain. If no relief after 3 rd dose, proceed to the ED for an evaluation. (Patient not taking: No sig reported) 25 tablet 3   predniSONE (DELTASONE) 20 MG tablet Take 2 tablets (40 mg total) by mouth daily. (Patient not taking: No sig reported) 10 tablet 0   No current facility-administered medications for this visit.    REVIEW OF SYSTEMS:  [X]  denotes positive finding, [ ]  denotes negative finding Cardiac  Comments:  Chest pain or chest pressure:    Shortness of breath upon exertion:    Short of breath when lying flat:    Irregular heart rhythm:        Vascular    Pain in calf, thigh, or hip brought on by ambulation:    Pain in feet at night that wakes you up from your sleep:     Blood clot in your veins:    Leg swelling:         Pulmonary    Oxygen at home:    Productive cough:     Wheezing:         Neurologic    Sudden weakness in arms or legs:     Sudden numbness in arms or legs:     Sudden onset of difficulty speaking or slurred speech:    Temporary loss of vision  in one eye:     Problems with  dizziness:         Gastrointestinal    Blood in stool:     Vomited blood:         Genitourinary    Burning when urinating:     Blood in urine:        Psychiatric    Major depression:         Hematologic    Bleeding problems:    Problems with blood clotting too easily:        Skin    Rashes or ulcers:        Constitutional    Fever or chills:      PHYSICAL EXAM: Vitals:   09/08/21 1605  BP: (!) 174/92  Pulse: 83  Resp: 18  Temp: 97.7 F (36.5 C)  TempSrc: Temporal  SpO2: 94%  Weight: 185 lb (83.9 kg)  Height: 5\' 10"  (1.778 m)    GENERAL: The patient is a well-nourished male, in no acute distress. The vital signs are documented above. CARDIAC: There is a regular rate and rhythm.  VASCULAR:  Left neck incision well healed. Significant 2+ pitting edema worse in the left lower extremity below the knee Palpable DP pulses bilaterally PULMONARY: No respiratory distress. ABDOMEN: Soft and non-tender. MUSCULOSKELETAL: There are no major deformities or cyanosis. NEUROLOGIC: No focal weakness or paresthesias are detected. Cn II-XII appear grossly intact.     DATA:    Indications: Swelling, and Pain.      Comparison Study: 03/25/2020: Lt- Reflux noted in the SFJ and GSV (above  and                    below knee). No evidence of deep vein thrombosis.   Performing Technologist: Ivan Croft      Examination Guidelines: A complete evaluation includes B-mode imaging,  spectral  Doppler, color Doppler, and power Doppler as needed of all accessible  portions  of each vessel. Bilateral testing is considered an integral part of a  complete  examination. Limited examinations for reoccurring indications may be  performed  as noted. The reflux portion of the exam is performed with the patient in  reverse Trendelenburg.  Significant venous reflux is defined as >500 ms in the superficial venous  system, and >1 second in the deep venous  system.      +--------------+---------+------+-----------+------------+--------+  LEFT          Reflux NoRefluxReflux TimeDiameter cmsComments                          Yes                                   +--------------+---------+------+-----------+------------+--------+  CFV           no                                              +--------------+---------+------+-----------+------------+--------+  FV mid                  yes   >1 second                       +--------------+---------+------+-----------+------------+--------+  Popliteal  yes   >1 second                       +--------------+---------+------+-----------+------------+--------+  GSV at [facility]    no                            0.54              +--------------+---------+------+-----------+------------+--------+  GSV prox thighno                            0.36              +--------------+---------+------+-----------+------------+--------+  GSV mid thigh           yes    >500 ms      0.37              +--------------+---------+------+-----------+------------+--------+  GSV dist thighno                            0.27              +--------------+---------+------+-----------+------------+--------+  GSV at knee             yes    >500 ms      0.20              +--------------+---------+------+-----------+------------+--------+  GSV prox calf           yes    >500 ms      0.29              +--------------+---------+------+-----------+------------+--------+  SSV Pop Fossa no                            0.11              +--------------+---------+------+-----------+------------+--------+  SSV prox calf no                            0.11              +--------------+---------+------+-----------+------------+--------+  SSV mid calf  no                            0.16               +--------------+---------+------+-----------+------------+--------+           Summary:  Left:  - No evidence of deep vein thrombosis seen in the left lower extremity,  from the common femoral through the popliteal veins.  - No evidence of superficial venous thrombosis in the left lower  extremity.     - Venous reflux is noted in the left greater saphenous vein in the thigh.  - Venous reflux is noted in the left greater saphenous vein in the calf.  - Venous reflux is noted in the left femoral vein.  - Venous reflux is noted in the left popliteal vein.     *See table(s) above for measurements and observations.   Electronically signed by Orlie Pollen on 09/04/2021 at 23:58:46 PM.   Assessment/Plan:  75 year old male well-known to our practice for carotid artery disease presents for new evaluation of lower extremity swelling and edema worse in the left leg.  I discussed that his ultrasound shows no  evidence of DVT.  I suspect that his lower extremity edema and swelling has been exacerbated by recent steroid use and also evidence of underlying venous insufficiency.  I recommended conservative therapy with leg elevation and compression stockings.  We did get him sized for knee-high compression stockings today 20 to 30 mmHg.  No indication for surgical intervention.  I will schedule him to see me in 3 months when he due for carotid duplex for surveillance of his left carotid after previous endarterectomy.   Marty Heck, MD [facility] Office: [phone]

## 2021-09-10 ENCOUNTER — Other Ambulatory Visit: Payer: Self-pay

## 2021-09-10 DIAGNOSIS — I6523 Occlusion and stenosis of bilateral carotid arteries: Secondary | ICD-10-CM

## 2021-09-11 DIAGNOSIS — E1165 Type 2 diabetes mellitus with hyperglycemia: Secondary | ICD-10-CM | POA: Diagnosis not present

## 2021-09-11 DIAGNOSIS — I10 Essential (primary) hypertension: Secondary | ICD-10-CM | POA: Diagnosis not present

## 2021-09-15 DIAGNOSIS — M545 Low back pain, unspecified: Secondary | ICD-10-CM | POA: Diagnosis not present

## 2021-09-17 DIAGNOSIS — M5432 Sciatica, left side: Secondary | ICD-10-CM | POA: Diagnosis not present

## 2021-09-22 DIAGNOSIS — I10 Essential (primary) hypertension: Secondary | ICD-10-CM | POA: Diagnosis not present

## 2021-09-22 DIAGNOSIS — M48062 Spinal stenosis, lumbar region with neurogenic claudication: Secondary | ICD-10-CM | POA: Diagnosis not present

## 2021-09-23 ENCOUNTER — Other Ambulatory Visit: Payer: Self-pay | Admitting: Neurosurgery

## 2021-09-24 ENCOUNTER — Other Ambulatory Visit: Payer: Self-pay

## 2021-09-24 ENCOUNTER — Encounter (HOSPITAL_COMMUNITY): Payer: Self-pay | Admitting: Certified Registered Nurse Anesthetist

## 2021-09-24 ENCOUNTER — Encounter (HOSPITAL_COMMUNITY): Payer: Self-pay

## 2021-09-24 ENCOUNTER — Encounter (HOSPITAL_COMMUNITY)
Admission: RE | Admit: 2021-09-24 | Discharge: 2021-09-24 | Disposition: A | Discharge status: Home / Self Care | Payer: Medicare Other | Source: Ambulatory Visit | Attending: Neurosurgery | Admitting: Neurosurgery

## 2021-09-24 DIAGNOSIS — Z20822 Contact with and (suspected) exposure to covid-19: Secondary | ICD-10-CM | POA: Diagnosis not present

## 2021-09-24 DIAGNOSIS — Z01818 Encounter for other preprocedural examination: Secondary | ICD-10-CM | POA: Diagnosis not present

## 2021-09-24 DIAGNOSIS — E119 Type 2 diabetes mellitus without complications: Secondary | ICD-10-CM | POA: Diagnosis not present

## 2021-09-24 HISTORY — DX: Other specified postprocedural states: Z98.890

## 2021-09-24 HISTORY — DX: Other complications of anesthesia, initial encounter: T88.59XA

## 2021-09-24 HISTORY — DX: Other specified postprocedural states: R11.2

## 2021-09-24 LAB — BASIC METABOLIC PANEL
Anion gap: 8 (ref 5–15)
BUN: 20 mg/dL (ref 8–23)
CO2: 27 mmol/L (ref 22–32)
Calcium: 9.4 mg/dL (ref 8.9–10.3)
Chloride: 105 mmol/L (ref 98–111)
Creatinine, Ser: 1.67 mg/dL — ABNORMAL HIGH (ref 0.61–1.24)
GFR, Estimated: 42 mL/min — ABNORMAL LOW (ref >60)
Glucose, Bld: 103 mg/dL — ABNORMAL HIGH (ref 70–99)
Potassium: 3.9 mmol/L (ref 3.5–5.1)
Sodium: 140 mmol/L (ref 135–145)

## 2021-09-24 LAB — CBC
HCT: 42.5 % (ref 39.0–52.0)
Hemoglobin: 13.8 g/dL (ref 13.0–17.0)
MCH: 28.1 pg (ref 26.0–34.0)
MCHC: 32.5 g/dL (ref 30.0–36.0)
MCV: 86.6 fL (ref 80.0–100.0)
Platelets: 195 10*3/uL (ref 150–400)
RBC: 4.91 MIL/uL (ref 4.22–5.81)
RDW: 15 % (ref 11.5–15.5)
WBC: 6.5 10*3/uL (ref 4.0–10.5)
nRBC: 0 % (ref 0.0–0.2)

## 2021-09-24 LAB — SARS CORONAVIRUS 2 (TAT 6-24 HRS): SARS Coronavirus 2: NEGATIVE

## 2021-09-24 LAB — SURGICAL PCR SCREEN
MRSA, PCR: NEGATIVE
Staphylococcus aureus: NEGATIVE

## 2021-09-24 LAB — GLUCOSE, CAPILLARY: Glucose-Capillary: 100 mg/dL — ABNORMAL HIGH (ref 70–99)

## 2021-09-24 NOTE — Progress Notes (Signed)
Anesthesia Chart Review:  75 year old male for lumbar 2 3 laminectomy/foraminotomy 09/25/2021 with Dr. Christella Noa.  Follows cardiology for history of CAD s/p DES to RCA in 2007.  Stress test 2016 showed finding consistent with prior inferior/inferolateral MI with moderate peri-infarct ischemia, EF 55 to 65%.  He was last seen by Dr. Domenic Polite on 06/30/2021 and per note reported no active angina with medical therapy for residual disease.  He was advised to continue current medications and follow-up in 6 months.  He also follows with vascular surgery for history of known right carotid artery occlusion and s/p left carotid endarterectomy 04/07/2020 for asymptomatic high-grade stenosis.  Most recent follow-up carotid duplex 02/10/2021 showed velocities in left ICA consistent with a 60 to 79% stenosis.  Dr. Carlis Abbott commented on this stating that he felt this may be overestimated due to contralateral occlusion and tortuosity in the vessel itself.  He recommended continued surveillance.  Scheduled for next carotid duplex on 12/22/2021.  Preop labs reviewed, elevated 1.67 C/W history of CKD (baseline appears to be around 1.7), otherwise unremarkable.  Reviewed vascular history with anesthesiologist Dr. Smith Robert.  Concern for rapid reocclusion of left carotid in setting of known right carotid occlusion.  Dr. Smith Robert is reached out to Dr. Christella Noa to discuss further, as of 5:15 PM, awaiting callback.  Carotid duplex 02/10/2021: Summary:  Right Carotid: Evidence consistent with a total occlusion of the right  ICA. The                 ECA appears >50% stenosed.   Left Carotid: Velocities in the left ICA are consistent with a 60-79%  stenosis.                The ECA appears >50% stenosed. 60-79% (high end of range)  but may                be falsely elevated secondary to vessel kink and  contralateral                occlusion.   Vertebrals:  Left vertebral artery demonstrates antegrade flow. Right  vertebral                artery demonstrates no discernable flow.  Subclavians: Normal flow hemodynamics were seen in bilateral subclavian               arteries.    Nuclear stress 11/04/2015: There was no ST segment deviation noted during stress. Findings consistent with prior inferior/inferolateral myocardial infarction with moderate peri-infarct ischemia. This is an intermediate risk study. The left ventricular ejection fraction is normal (55-65%).   Wynonia Musty [facility] Short Stay Center/Anesthesiology Phone [phone] 09/24/2021 5:18 PM

## 2021-09-24 NOTE — Progress Notes (Signed)
PCP - Dr. Allyn Kenner Cardiologist - Dr. [name]  PPM/ICD - denies   Chest x-ray - 09/03/21 EKG - 09/03/21 Stress Test - 11/04/15 ECHO - 07/02/06 Cardiac Cath - 2010  Sleep Study - denies   DM- denies  Blood Thinner Instructions: n/a Aspirin Instructions: Pt instructed to stop taking ASA on 09/22/21.  ERAS Protcol - yes, no drink   COVID TEST- 09/24/21 at PAT appt, results pending   Anesthesia review: yes, cardiac history. Cardiac clearance needed?  Patient denies shortness of breath, fever, cough and chest pain at PAT appointment   All instructions explained to the patient, with a verbal understanding of the material. Patient agrees to go over the instructions while at home for a better understanding. Patient also instructed to wear a mask in public after being tested for COVID-19. The opportunity to ask questions was provided.

## 2021-09-24 NOTE — Pre-Procedure Instructions (Signed)
Surgical Instructions   Your procedure is scheduled on Friday, October 14th. Report to [facility] Main Entrance "A" at 05:30 A.M., then check in with the Admitting office. Call this number if you have problems the morning of surgery: [phone]   If you have any questions prior to your surgery date call [phone]: Open Monday-Friday 8am-4pm   Remember: Do not eat after midnight the night before your surgery  You may drink clear liquids until 04:30 AM the morning of your surgery.   Clear liquids allowed are: Water, Non-Citrus Juices (without pulp), Carbonated Beverages, Clear Tea, Black Coffee Only, and Gatorade     Take these medicines the morning of surgery with A SIP OF WATER  allopurinol (ZYLOPRIM) atorvastatin (LIPITOR)  hydrALAZINE (APRESOLINE) metoprolol succinate (TOPROL-XL)  omeprazole (PRILOSEC)    If needed: acetaminophen (TYLENOL) diazepam (VALIUM) nitroGLYCERIN (NITROSTAT)- if you need to take this, notify your Nurse on arrival to Pre-op  oxyCODONE-acetaminophen (PERCOCET/ROXICET)    As of today, STOP taking any Aspirin (unless otherwise instructed by your surgeon) Aleve, Naproxen, Ibuprofen, Motrin, Advil, Goody's, BC's, all herbal medications, fish oil, and all vitamins.                     Do NOT Smoke (Tobacco/Vaping) or drink Alcohol 24 hours prior to your procedure.  If you use a CPAP at night, you may bring all equipment for your overnight stay.   Contacts, glasses, piercing's, hearing aid's, dentures or partials may not be worn into surgery, please bring cases for these belongings.    For patients admitted to the hospital, discharge time will be determined by your treatment team.   Patients discharged the day of surgery will not be allowed to drive home, and someone needs to stay with them for 24 hours.  NO VISITORS WILL BE ALLOWED IN PRE-OP WHERE PATIENTS GET READY FOR SURGERY.  ONLY 1 SUPPORT PERSON MAY BE PRESENT IN THE WAITING ROOM WHILE YOU  ARE IN SURGERY.  IF YOU ARE TO BE ADMITTED, ONCE YOU ARE IN YOUR ROOM YOU WILL BE ALLOWED TWO (2) VISITORS.  Minor children may have two parents present. Special consideration for safety and communication needs will be reviewed on a case by case basis.   Special instructions:   [facility]- Preparing For Surgery  Before surgery, you can play an important role. Because skin is not sterile, your skin needs to be as free of germs as possible. You can reduce the number of germs on your skin by washing with CHG (chlorahexidine gluconate) Soap before surgery.  CHG is an antiseptic cleaner which kills germs and bonds with the skin to continue killing germs even after washing.    Oral Hygiene is also important to reduce your risk of infection.  Remember - BRUSH YOUR TEETH THE MORNING OF SURGERY WITH YOUR REGULAR TOOTHPASTE  Please do not use if you have an allergy to CHG or antibacterial soaps. If your skin becomes reddened/irritated stop using the CHG.  Do not shave (including legs and underarms) for at least 48 hours prior to first CHG shower. It is OK to shave your face.  Please follow these instructions carefully.   Shower the NIGHT BEFORE SURGERY and the MORNING OF SURGERY  If you chose to wash your hair, wash your hair first as usual with your normal shampoo.  After you shampoo, rinse your hair and body thoroughly to remove the shampoo.  Use CHG Soap as you would any other liquid soap. You can  apply CHG directly to the skin and wash gently with a scrungie or a clean washcloth.   Apply the CHG Soap to your body ONLY FROM THE NECK DOWN.  Do not use on open wounds or open sores. Avoid contact with your eyes, ears, mouth and genitals (private parts). Wash Face and genitals (private parts)  with your normal soap.   Wash thoroughly, paying special attention to the area where your surgery will be performed.  Thoroughly rinse your body with warm water from the neck down.  DO NOT shower/wash with  your normal soap after using and rinsing off the CHG Soap.  Pat yourself dry with a CLEAN TOWEL.  Wear CLEAN PAJAMAS to bed the night before surgery  Place CLEAN SHEETS on your bed the night before your surgery  DO NOT SLEEP WITH PETS.   Day of Surgery: Shower with CHG soap. Do not wear jewelry Do not wear lotions, powders, colognes, or deodorant. Men may shave face and neck. Do not bring valuables to the hospital. [facility] is not responsible for any belongings or valuables. Wear Clean/Comfortable clothing the morning of surgery Remember to brush your teeth WITH YOUR REGULAR TOOTHPASTE.   Please read over the following fact sheets that you were given.   3 days prior to your procedure or After your COVID test   You are not required to quarantine however you are required to wear a well-fitting mask when you are out and around people not in your household. If your mask becomes wet or soiled, replace with a new one.   Wash your hands often with soap and water for 20 seconds or clean your hands with an alcohol-based hand sanitizer that contains at least 60% alcohol.   Do not share personal items.   Notify your provider:  o if you are in close contact with someone who has COVID  o or if you develop a fever of 100.4 or greater, sneezing, cough, sore throat, shortness of breath or body aches.

## 2021-09-24 NOTE — Anesthesia Preprocedure Evaluation (Deleted)
Anesthesia Evaluation    Airway        Dental   Pulmonary former smoker,           Cardiovascular hypertension,      Neuro/Psych    GI/Hepatic   Endo/Other    Renal/GU      Musculoskeletal   Abdominal   Peds  Hematology   Anesthesia Other Findings   Reproductive/Obstetrics                             Anesthesia Physical Anesthesia Plan  ASA:   Anesthesia Plan:    Post-op Pain Management:    Induction:   PONV Risk Score and Plan:   Airway Management Planned:   Additional Equipment:   Intra-op Plan:   Post-operative Plan:   Informed Consent:   Plan Discussed with:   Anesthesia Plan Comments: (Needs vascular clearance for possible re-stenosis of L carotid. Vascular team has ordered carotid duplex. )        Anesthesia Quick Evaluation

## 2021-09-25 ENCOUNTER — Encounter (HOSPITAL_COMMUNITY): Admission: RE | Payer: Self-pay | Source: Home / Self Care

## 2021-09-25 ENCOUNTER — Ambulatory Visit (HOSPITAL_COMMUNITY): Admission: RE | Admit: 2021-09-25 | Payer: Medicare Other | Source: Home / Self Care | Admitting: Neurosurgery

## 2021-09-25 SURGERY — LUMBAR LAMINECTOMY/DECOMPRESSION MICRODISCECTOMY 1 LEVEL
Anesthesia: General

## 2021-09-29 ENCOUNTER — Ambulatory Visit: Payer: Medicare Other | Admitting: Vascular Surgery

## 2021-09-29 ENCOUNTER — Ambulatory Visit (HOSPITAL_COMMUNITY)
Admission: RE | Admit: 2021-09-29 | Discharge: 2021-09-29 | Disposition: A | Discharge status: Home / Self Care | Payer: Medicare Other | Source: Ambulatory Visit | Attending: Vascular Surgery | Admitting: Vascular Surgery

## 2021-09-29 ENCOUNTER — Encounter: Payer: Self-pay | Admitting: Vascular Surgery

## 2021-09-29 ENCOUNTER — Other Ambulatory Visit: Payer: Self-pay

## 2021-09-29 VITALS — BP 166/96 | HR 85 | Temp 97.8°F | Resp 18 | Ht 70.0 in | Wt 183.0 lb

## 2021-09-29 DIAGNOSIS — I6522 Occlusion and stenosis of left carotid artery: Secondary | ICD-10-CM | POA: Diagnosis not present

## 2021-09-29 DIAGNOSIS — I6523 Occlusion and stenosis of bilateral carotid arteries: Secondary | ICD-10-CM

## 2021-09-29 NOTE — Progress Notes (Signed)
Patient name: Derek [name] MRN: [id number] DOB: [birth year] Sex: male  REASON FOR VISIT: Follow-up for carotid artery surveillance  HPI: Derek [name] is a 75 y.o. male who presents for surveillance of his carotid artery disease and for evaluation prior to spine surgery.  He has a known right carotid artery occlusion.  He most recently underwent a left carotid endarterectomy on 04/07/2020 for an asymptomatic high-grade stenosis.  He is scheduled to have lumbar spine surgery with Dr. Christella Noa but this was delayed given concern from anesthesiologist regarding his carotid disease.  On follow-up today remains asymptomatic with no recent neurologic events.  He has no concerns or questions today.  Past Medical History:  Diagnosis Date   Anxiety    Arthritis    Atypical atrial flutter (HCC)    Carotid artery disease (HCC)    Cataract    Chronic idiopathic thrombocytopenia (HCC)    Chronic renal insufficiency    Single kidney   Complication of anesthesia    Coronary atherosclerosis of native coronary vessel    DES RCA 2007   Depression    Essential hypertension    GERD (gastroesophageal reflux disease)    Hx of colonic polyps 01/01/2015   Mixed hyperlipidemia    Myocardial infarction ([place]) 2006-07-05   STEMI    Peripheral vascular disease (HCC)    PONV (postoperative nausea and vomiting)     Past Surgical History:  Procedure Laterality Date   CARDIAC CATHETERIZATION  2010   Patent stent and nonobsturctive cad following an abnormal cardiolite study may of 2010 with an apical lateral defect   CARPAL TUNNEL RELEASE  11/28/2012   Procedure: CARPAL TUNNEL RELEASE;  Surgeon: Jessy Oto, MD;  Location: [facility];  Service: Orthopedics;  Laterality: Left;  Left anterior submuscular transposition of ulnar nerve at elbow, left open carpal tunnel release   COLONOSCOPY     ELBOW SURGERY Left 2013   ENDARTERECTOMY Left 04/07/2020   Procedure: LEFT CAROTID ENDARTERECTOMY;  Surgeon: Marty Heck, MD;  Location: [facility];  Service: Vascular;  Laterality: Left;   [place] REMOVAL  03/20/2012   Procedure: HARDWARE REMOVAL;  Surgeon: Jessy Oto, MD;  Location: [place];  Service: Orthopedics;  Laterality: Left;  Hardware removal 3 knowles pins left hip   HERNIA REPAIR     JOINT REPLACEMENT     PATCH ANGIOPLASTY Left 04/07/2020   Procedure: Patch Angioplasty Left Carotid;  Surgeon: Marty Heck, MD;  Location: [facility];  Service: Vascular;  Laterality: Left;   TONSILLECTOMY     TOTAL HIP ARTHROPLASTY  03/20/2012   Procedure: TOTAL HIP ARTHROPLASTY;  Surgeon: Jessy Oto, MD;  Location: [facility];  Service: Orthopedics;  Laterality: Left;  Left total hip replacement, ceramic on poly    Family History  Problem Relation Age of Onset   Stroke Other    Coronary artery disease Other    Colon cancer Brother        has colostomy    Esophageal cancer Neg Hx    Stomach cancer Neg Hx    Rectal cancer Neg Hx    Colon polyps Neg Hx     SOCIAL HISTORY: Social History   Tobacco Use   Smoking status: Former    Packs/day: 3.00    Years: 42.00    Pack years: 126.00    Types:  Cigarettes    Start date: 10/30/1960    Quit date: 07/23/2004    Years since quitting: 17.1   Smokeless tobacco: Never   Tobacco comments:    Counseled to remain smoke free  Substance Use Topics   Alcohol use: No    Alcohol/week: 0.0 standard drinks    Allergies  Allergen Reactions   Ibuprofen Other (See Comments)    One kidney Pt has one Kidney    Nsaids    Prednisone Swelling    Causes swelling in legs and feet     Current Outpatient Medications  Medication Sig Dispense Refill   acetaminophen (TYLENOL) 500 MG tablet Take 500-1,000 mg by mouth every 6 (six) hours as needed for moderate pain.     allopurinol (ZYLOPRIM) 100 MG tablet Take 200 mg by mouth daily.     atorvastatin (LIPITOR) 20 MG tablet Take 1  tablet (20 mg total) by mouth daily. 30 tablet 9   diazepam (VALIUM) 2 MG tablet Take 2 mg by mouth 2 (two) times daily as needed for anxiety.     ferrous sulfate 325 (65 FE) MG tablet Take 325 mg by mouth daily with breakfast.     furosemide (LASIX) 40 MG tablet Take 20 mg by mouth daily.     hydrALAZINE (APRESOLINE) 25 MG tablet TAKE 1 TABLET BY MOUTH THREE TIMES DAILY 180 tablet 2   metoprolol succinate (TOPROL-XL) 25 MG 24 hr tablet Take 25 mg by mouth daily.      Multiple Vitamins-Minerals (PRESERVISION AREDS PO) Take 1 tablet by mouth in the morning and at bedtime.     nitroGLYCERIN (NITROSTAT) 0.4 MG SL tablet Place 1 tablet (0.4 mg total) under the tongue every 5 (five) minutes x 3 doses as needed (if no relief after 3rd dose, proceed to the ED for an evaluation). For chest pain. If no relief after 3 rd dose, proceed to the ED for an evaluation. 25 tablet 3   omeprazole (PRILOSEC) 20 MG capsule Take 20 mg by mouth daily.       oxyCODONE-acetaminophen (PERCOCET/ROXICET) 5-325 MG tablet Take 0.25 tablets by mouth every 6 (six) hours as needed for severe pain.     Potassium 99 MG TABS Take 99 mg by mouth daily as needed (cramping in legs).     vitamin B-12 (CYANOCOBALAMIN) 1000 MCG tablet Take 1,000 mcg by mouth daily.     aspirin EC 81 MG tablet Take 81 mg by mouth daily. Swallow whole. (Patient not taking: Reported on 09/29/2021)     Omega-3 Fatty Acids (FISH OIL MAXIMUM STRENGTH) 1200 MG CAPS Take 1,200 mg by mouth in the morning and at bedtime. (Patient not taking: Reported on 09/29/2021)     No current facility-administered medications for this visit.    REVIEW OF SYSTEMS:  [X]  denotes positive finding, [ ]  denotes negative finding Cardiac  Comments:  Chest pain or chest pressure:    Shortness of breath upon exertion:    Short of breath when lying flat:    Irregular heart rhythm:        Vascular    Pain in calf, thigh, or hip brought on by ambulation:    Pain in feet at night  that wakes you up from your sleep:     Blood clot in your veins:    Leg swelling:         Pulmonary    Oxygen at home:    Productive cough:     Wheezing:  Neurologic    Sudden weakness in arms or legs:     Sudden numbness in arms or legs:     Sudden onset of difficulty speaking or slurred speech:    Temporary loss of vision in one eye:     Problems with dizziness:         Gastrointestinal    Blood in stool:     Vomited blood:         Genitourinary    Burning when urinating:     Blood in urine:        Psychiatric    Major depression:         Hematologic    Bleeding problems:    Problems with blood clotting too easily:        Skin    Rashes or ulcers:        Constitutional    Fever or chills:      PHYSICAL EXAM: Vitals:   09/29/21 1449 09/29/21 1456  BP: (!) 185/99 (!) 166/96  Pulse: 85 85  Resp: 18   Temp: 97.8 F (36.6 C)   TempSrc: Temporal   SpO2: 98%   Weight: 183 lb (83 kg)   Height: 5\' 10"  (1.778 m)     GENERAL: The patient is a well-nourished male, in no acute distress. The vital signs are documented above. CARDIAC: There is a regular rate and rhythm.  VASCULAR:  Left neck incision well healed. PULMONARY: No respiratory distress. ABDOMEN: Soft and non-tender. MUSCULOSKELETAL: There are no major deformities or cyanosis. NEUROLOGIC: No focal weakness or paresthesias are detected. Cn II-XII appear grossly intact.     DATA:   Duplex today again shows right ICA occlusion and a left ICA velocity consistent with 60-79% stenosis but I think this is overestimated given contralateral occlusion and tortuosity in the vessel.    Assessment/Plan:  75 year old male status post left carotid endarterectomy on 04/07/2020 for an asymptomatic high-grade stenosis in the setting of known right ICA occlusion.  He continues to do well and is asymptomatic from his carotid disease.  In evaluating his velocities they have been stable from 265/97 six months ago to  253/79 today in the left ICA where he had carotid endarterectomy.  Again I think these velocities are overestimated given contralateral occlusion and tortuosity in the vessel.  B-mode imaging really does not show significant disease here.  I think it would okay for him to proceed with spine surgery and certainly he is higher risk but I would not do anything from a surgical standpoint other than continued surveillance at this time.   Marty Heck, MD [facility] Office: [phone]

## 2021-10-01 ENCOUNTER — Other Ambulatory Visit: Payer: Self-pay | Admitting: Neurosurgery

## 2021-10-02 ENCOUNTER — Other Ambulatory Visit: Payer: Self-pay

## 2021-10-02 DIAGNOSIS — I6522 Occlusion and stenosis of left carotid artery: Secondary | ICD-10-CM

## 2021-10-05 ENCOUNTER — Other Ambulatory Visit: Payer: Self-pay | Admitting: Cardiology

## 2021-10-06 NOTE — Progress Notes (Signed)
Covid test DOS - pt can states that he does not drive.

## 2021-10-07 ENCOUNTER — Encounter (HOSPITAL_COMMUNITY): Payer: Self-pay | Admitting: Neurosurgery

## 2021-10-07 ENCOUNTER — Other Ambulatory Visit: Payer: Self-pay

## 2021-10-07 NOTE — Anesthesia Preprocedure Evaluation (Addendum)
Anesthesia Evaluation  Patient identified by MRN, date of birth, ID band Patient awake    Reviewed: Allergy & Precautions, NPO status , Patient's Chart, lab work & pertinent test results  History of Anesthesia Complications (+) PONV  Airway Mallampati: III  TM Distance: >3 FB Neck ROM: Full    Dental  (+) Edentulous Upper, Edentulous Lower, Dental Advisory Given   Pulmonary neg pulmonary ROS, former smoker,    Pulmonary exam normal breath sounds clear to auscultation       Cardiovascular hypertension, + CAD, + Past MI, + Cardiac Stents and + Peripheral Vascular Disease  Normal cardiovascular exam+ dysrhythmias Atrial Fibrillation  Rhythm:Regular Rate:Normal     Neuro/Psych PSYCHIATRIC DISORDERS Anxiety Depression negative neurological ROS     GI/Hepatic negative GI ROS, Neg liver ROS,   Endo/Other  negative endocrine ROS  Renal/GU Renal InsufficiencyRenal diseaseLab Results      Component                Value               Date                      CREATININE               1.67 (H)            09/24/2021                BUN                      20                  09/24/2021                NA                       140                 09/24/2021                K                        3.9                 09/24/2021                CL                       105                 09/24/2021                CO2                      27                  09/24/2021             negative genitourinary   Musculoskeletal  (+) Arthritis ,   Abdominal   Peds  Hematology negative hematology ROS (+)   Anesthesia Other Findings   Reproductive/Obstetrics                           Anesthesia Physical Anesthesia Plan  ASA: 3  Anesthesia Plan: General   Post-op Pain  Management:    Induction: Intravenous  PONV Risk Score and Plan: 3 and Dexamethasone, Ondansetron, Treatment may vary due to age or medical  condition and Aprepitant  Airway Management Planned: Oral ETT  Additional Equipment:   Intra-op Plan:   Post-operative Plan: Extubation in OR  Informed Consent: I have reviewed the patients History and Physical, chart, labs and discussed the procedure including the risks, benefits and alternatives for the proposed anesthesia with the patient or authorized representative who has indicated his/her understanding and acceptance.     Dental advisory given  Plan Discussed with: CRNA  Anesthesia Plan Comments: (See recent PAT note by Karoline Caldwell, PA-C 09/24/21. Case postponed at that time as pt needed vascular surgery followup. He did see Dr. Carlis Abbott on 09/29/21. Per note, "75 year old male status post left carotid endarterectomy on 04/07/2020 for an asymptomatic high-grade stenosis in the setting of known right ICA occlusion.  He continues to do well and is asymptomatic from his carotid disease.  In evaluating his velocities they have been stable from 265/97 six months ago to 253/79 today in the left ICA where he had carotid endarterectomy.  Again I think these velocities are overestimated given contralateral occlusion and tortuosity in the vessel.  B-mode imaging really does not show significant disease here.  I think it would okay for him to proceed with spine surgery and certainly he is higher risk but I would not do anything from a surgical standpoint other than continued surveillance at this time."  )      Anesthesia Quick Evaluation

## 2021-10-08 ENCOUNTER — Observation Stay (HOSPITAL_COMMUNITY)
Admission: RE | Admit: 2021-10-08 | Discharge: 2021-10-09 | Disposition: A | Discharge status: Home / Self Care | Payer: Medicare Other | Source: Ambulatory Visit | Attending: Neurosurgery | Admitting: Neurosurgery

## 2021-10-08 ENCOUNTER — Encounter (HOSPITAL_COMMUNITY)
Admission: RE | Disposition: A | Discharge status: Home / Self Care | Payer: Self-pay | Source: Ambulatory Visit | Attending: Neurosurgery

## 2021-10-08 ENCOUNTER — Ambulatory Visit (HOSPITAL_COMMUNITY): Payer: Medicare Other | Admitting: Physician Assistant

## 2021-10-08 ENCOUNTER — Inpatient Hospital Stay (HOSPITAL_COMMUNITY): Payer: Medicare Other

## 2021-10-08 ENCOUNTER — Ambulatory Visit (HOSPITAL_COMMUNITY): Payer: Medicare Other

## 2021-10-08 ENCOUNTER — Encounter (HOSPITAL_COMMUNITY): Payer: Self-pay | Admitting: Neurosurgery

## 2021-10-08 DIAGNOSIS — M5416 Radiculopathy, lumbar region: Secondary | ICD-10-CM | POA: Diagnosis not present

## 2021-10-08 DIAGNOSIS — M2578 Osteophyte, vertebrae: Secondary | ICD-10-CM | POA: Diagnosis not present

## 2021-10-08 DIAGNOSIS — Z96642 Presence of left artificial hip joint: Secondary | ICD-10-CM | POA: Diagnosis not present

## 2021-10-08 DIAGNOSIS — K219 Gastro-esophageal reflux disease without esophagitis: Secondary | ICD-10-CM | POA: Diagnosis not present

## 2021-10-08 DIAGNOSIS — Z9889 Other specified postprocedural states: Secondary | ICD-10-CM | POA: Diagnosis not present

## 2021-10-08 DIAGNOSIS — D696 Thrombocytopenia, unspecified: Secondary | ICD-10-CM | POA: Diagnosis not present

## 2021-10-08 DIAGNOSIS — I700 Atherosclerosis of aorta: Secondary | ICD-10-CM | POA: Diagnosis not present

## 2021-10-08 DIAGNOSIS — Z79899 Other long term (current) drug therapy: Secondary | ICD-10-CM | POA: Insufficient documentation

## 2021-10-08 DIAGNOSIS — Z20822 Contact with and (suspected) exposure to covid-19: Secondary | ICD-10-CM | POA: Diagnosis not present

## 2021-10-08 DIAGNOSIS — M47816 Spondylosis without myelopathy or radiculopathy, lumbar region: Secondary | ICD-10-CM | POA: Diagnosis not present

## 2021-10-08 DIAGNOSIS — M48062 Spinal stenosis, lumbar region with neurogenic claudication: Secondary | ICD-10-CM | POA: Diagnosis not present

## 2021-10-08 DIAGNOSIS — E782 Mixed hyperlipidemia: Secondary | ICD-10-CM | POA: Diagnosis not present

## 2021-10-08 DIAGNOSIS — Z7982 Long term (current) use of aspirin: Secondary | ICD-10-CM | POA: Insufficient documentation

## 2021-10-08 DIAGNOSIS — Z419 Encounter for procedure for purposes other than remedying health state, unspecified: Secondary | ICD-10-CM

## 2021-10-08 HISTORY — PX: LUMBAR LAMINECTOMY/DECOMPRESSION MICRODISCECTOMY: SHX5026

## 2021-10-08 LAB — SARS CORONAVIRUS 2 BY RT PCR (HOSPITAL ORDER, PERFORMED IN ~~LOC~~ HOSPITAL LAB): SARS Coronavirus 2: NEGATIVE

## 2021-10-08 LAB — GLUCOSE, CAPILLARY
Glucose-Capillary: 101 mg/dL — ABNORMAL HIGH (ref 70–99)
Glucose-Capillary: 96 mg/dL (ref 70–99)

## 2021-10-08 SURGERY — LUMBAR LAMINECTOMY/DECOMPRESSION MICRODISCECTOMY 1 LEVEL
Anesthesia: General

## 2021-10-08 MED ORDER — FENTANYL CITRATE (PF) 100 MCG/2ML IJ SOLN: 25.0000 ug | INTRAMUSCULAR | Status: DC | PRN

## 2021-10-08 MED ORDER — VASOPRESSIN 20 UNIT/ML IV SOLN
INTRAVENOUS | Status: AC
  Filled 2021-10-08: qty 1

## 2021-10-08 MED ORDER — DIAZEPAM 5 MG PO TABS: 5.0000 mg | ORAL_TABLET | Freq: Four times a day (QID) | ORAL | Status: DC | PRN

## 2021-10-08 MED ORDER — FLEET ENEMA 7-19 GM/118ML RE ENEM: 1.0000 | ENEMA | Freq: Once | RECTAL | Status: DC | PRN

## 2021-10-08 MED ORDER — SODIUM CHLORIDE 0.9% FLUSH: 3.0000 mL | Freq: Two times a day (BID) | INTRAVENOUS | Status: DC

## 2021-10-08 MED ORDER — SODIUM CHLORIDE 0.9 % IV SOLN: 250.0000 mL | INTRAVENOUS | Status: DC

## 2021-10-08 MED ORDER — ASPIRIN EC 81 MG PO TBEC
81.0000 mg | DELAYED_RELEASE_TABLET | Freq: Every day | ORAL | Status: DC
  Administered 2021-10-08 – 2021-10-09 (×2): 81 mg via ORAL
  Filled 2021-10-08 (×2): qty 1

## 2021-10-08 MED ORDER — DEXAMETHASONE SODIUM PHOSPHATE 10 MG/ML IJ SOLN
INTRAMUSCULAR | Status: DC | PRN
  Administered 2021-10-08: 10 mg via INTRAVENOUS

## 2021-10-08 MED ORDER — 0.9 % SODIUM CHLORIDE (POUR BTL) OPTIME
TOPICAL | Status: DC | PRN
  Administered 2021-10-08: 1000 mL

## 2021-10-08 MED ORDER — FENTANYL CITRATE (PF) 250 MCG/5ML IJ SOLN
INTRAMUSCULAR | Status: AC
  Filled 2021-10-08: qty 5

## 2021-10-08 MED ORDER — CHLORHEXIDINE GLUCONATE 0.12 % MT SOLN
15.0000 mL | Freq: Once | OROMUCOSAL | Status: AC
  Administered 2021-10-08: 15 mL via OROMUCOSAL
  Filled 2021-10-08: qty 15

## 2021-10-08 MED ORDER — SODIUM CHLORIDE 0.9% FLUSH: 3.0000 mL | INTRAVENOUS | Status: DC | PRN

## 2021-10-08 MED ORDER — BUPIVACAINE HCL (PF) 0.5 % IJ SOLN
INTRAMUSCULAR | Status: AC
  Filled 2021-10-08: qty 30

## 2021-10-08 MED ORDER — PROPOFOL 10 MG/ML IV BOLUS
INTRAVENOUS | Status: DC | PRN
  Administered 2021-10-08: 130 mg via INTRAVENOUS

## 2021-10-08 MED ORDER — PROPOFOL 10 MG/ML IV BOLUS
INTRAVENOUS | Status: AC
  Filled 2021-10-08: qty 20

## 2021-10-08 MED ORDER — OXYCODONE HCL ER 10 MG PO T12A
10.0000 mg | EXTENDED_RELEASE_TABLET | Freq: Two times a day (BID) | ORAL | Status: DC
  Administered 2021-10-08 – 2021-10-09 (×2): 10 mg via ORAL
  Filled 2021-10-08 (×2): qty 1

## 2021-10-08 MED ORDER — THROMBIN 5000 UNITS EX SOLR
CUTANEOUS | Status: AC
  Filled 2021-10-08: qty 10000

## 2021-10-08 MED ORDER — SUGAMMADEX SODIUM 200 MG/2ML IV SOLN
INTRAVENOUS | Status: DC | PRN
  Administered 2021-10-08: 200 mg via INTRAVENOUS

## 2021-10-08 MED ORDER — PHENYLEPHRINE 40 MCG/ML (10ML) SYRINGE FOR IV PUSH (FOR BLOOD PRESSURE SUPPORT)
PREFILLED_SYRINGE | INTRAVENOUS | Status: DC | PRN
  Administered 2021-10-08: 80 ug via INTRAVENOUS
  Administered 2021-10-08: 120 ug via INTRAVENOUS
  Administered 2021-10-08: 200 ug via INTRAVENOUS

## 2021-10-08 MED ORDER — ACETAMINOPHEN 500 MG PO TABS
1000.0000 mg | ORAL_TABLET | Freq: Four times a day (QID) | ORAL | Status: AC
  Administered 2021-10-08 – 2021-10-09 (×3): 1000 mg via ORAL
  Filled 2021-10-08 (×3): qty 2

## 2021-10-08 MED ORDER — PROSIGHT PO TABS
1.0000 | ORAL_TABLET | Freq: Two times a day (BID) | ORAL | Status: DC
  Administered 2021-10-09: 1 via ORAL
  Filled 2021-10-08 (×4): qty 1

## 2021-10-08 MED ORDER — SENNA 8.6 MG PO TABS
1.0000 | ORAL_TABLET | Freq: Two times a day (BID) | ORAL | Status: DC
  Administered 2021-10-08 – 2021-10-09 (×2): 8.6 mg via ORAL
  Filled 2021-10-08 (×2): qty 1

## 2021-10-08 MED ORDER — POTASSIUM CHLORIDE IN NACL 20-0.9 MEQ/L-% IV SOLN: INTRAVENOUS | Status: DC

## 2021-10-08 MED ORDER — HYDRALAZINE HCL 10 MG PO TABS
25.0000 mg | ORAL_TABLET | Freq: Three times a day (TID) | ORAL | Status: DC
  Administered 2021-10-08 – 2021-10-09 (×2): 25 mg via ORAL
  Filled 2021-10-08 (×2): qty 3

## 2021-10-08 MED ORDER — LIDOCAINE-EPINEPHRINE 1 %-1:100000 IJ SOLN
INTRAMUSCULAR | Status: AC
  Filled 2021-10-08: qty 1

## 2021-10-08 MED ORDER — ONDANSETRON HCL 4 MG/2ML IJ SOLN: 4.0000 mg | Freq: Four times a day (QID) | INTRAMUSCULAR | Status: DC | PRN

## 2021-10-08 MED ORDER — APREPITANT 40 MG PO CAPS: 40.0000 mg | ORAL_CAPSULE | Freq: Once | ORAL | Status: AC

## 2021-10-08 MED ORDER — LIDOCAINE-EPINEPHRINE 1 %-1:100000 IJ SOLN
INTRAMUSCULAR | Status: DC | PRN
  Administered 2021-10-08: 5 mL

## 2021-10-08 MED ORDER — DOCUSATE SODIUM 100 MG PO CAPS
100.0000 mg | ORAL_CAPSULE | Freq: Two times a day (BID) | ORAL | Status: DC
  Administered 2021-10-08 – 2021-10-09 (×2): 100 mg via ORAL
  Filled 2021-10-08 (×2): qty 1

## 2021-10-08 MED ORDER — BISACODYL 5 MG PO TBEC: 5.0000 mg | DELAYED_RELEASE_TABLET | Freq: Every day | ORAL | Status: DC | PRN

## 2021-10-08 MED ORDER — ACETAMINOPHEN 650 MG RE SUPP: 650.0000 mg | RECTAL | Status: DC | PRN

## 2021-10-08 MED ORDER — ALLOPURINOL 100 MG PO TABS
200.0000 mg | ORAL_TABLET | Freq: Every day | ORAL | Status: DC
  Administered 2021-10-09: 200 mg via ORAL
  Filled 2021-10-08: qty 2

## 2021-10-08 MED ORDER — ACETAMINOPHEN 325 MG PO TABS: 650.0000 mg | ORAL_TABLET | ORAL | Status: DC | PRN

## 2021-10-08 MED ORDER — THROMBIN 5000 UNITS EX SOLR
CUTANEOUS | Status: DC | PRN
  Administered 2021-10-08 (×2): 5000 [IU] via TOPICAL

## 2021-10-08 MED ORDER — LIDOCAINE 2% (20 MG/ML) 5 ML SYRINGE
INTRAMUSCULAR | Status: DC | PRN
  Administered 2021-10-08: 60 mg via INTRAVENOUS
  Administered 2021-10-08: 40 mg via INTRAVENOUS

## 2021-10-08 MED ORDER — PANTOPRAZOLE SODIUM 40 MG PO TBEC
40.0000 mg | DELAYED_RELEASE_TABLET | Freq: Every day | ORAL | Status: DC
  Administered 2021-10-08 – 2021-10-09 (×2): 40 mg via ORAL
  Filled 2021-10-08 (×2): qty 1

## 2021-10-08 MED ORDER — METOPROLOL SUCCINATE ER 25 MG PO TB24
25.0000 mg | ORAL_TABLET | Freq: Every day | ORAL | Status: DC
  Administered 2021-10-09: 25 mg via ORAL
  Filled 2021-10-08: qty 1

## 2021-10-08 MED ORDER — LACTATED RINGERS IV SOLN: INTRAVENOUS | Status: DC

## 2021-10-08 MED ORDER — POTASSIUM 99 MG PO TABS: 99.0000 mg | ORAL_TABLET | Freq: Every day | ORAL | Status: DC | PRN

## 2021-10-08 MED ORDER — ONDANSETRON HCL 4 MG PO TABS: 4.0000 mg | ORAL_TABLET | Freq: Four times a day (QID) | ORAL | Status: DC | PRN

## 2021-10-08 MED ORDER — FENTANYL CITRATE (PF) 250 MCG/5ML IJ SOLN
INTRAMUSCULAR | Status: DC | PRN
  Administered 2021-10-08 (×3): 25 ug via INTRAVENOUS
  Administered 2021-10-08 (×3): 50 ug via INTRAVENOUS
  Administered 2021-10-08: 100 ug via INTRAVENOUS

## 2021-10-08 MED ORDER — OXYCODONE HCL 5 MG PO TABS
5.0000 mg | ORAL_TABLET | ORAL | Status: DC | PRN
  Administered 2021-10-09: 5 mg via ORAL
  Filled 2021-10-08 (×2): qty 1

## 2021-10-08 MED ORDER — ATORVASTATIN CALCIUM 10 MG PO TABS
20.0000 mg | ORAL_TABLET | Freq: Every day | ORAL | Status: DC
  Administered 2021-10-08 – 2021-10-09 (×2): 20 mg via ORAL
  Filled 2021-10-08 (×2): qty 2

## 2021-10-08 MED ORDER — FERROUS SULFATE 325 (65 FE) MG PO TABS
325.0000 mg | ORAL_TABLET | Freq: Every day | ORAL | Status: DC
  Administered 2021-10-09: 325 mg via ORAL
  Filled 2021-10-08: qty 1

## 2021-10-08 MED ORDER — BUPIVACAINE HCL (PF) 0.5 % IJ SOLN
INTRAMUSCULAR | Status: DC | PRN
  Administered 2021-10-08: 30 mL

## 2021-10-08 MED ORDER — CHLORHEXIDINE GLUCONATE CLOTH 2 % EX PADS
6.0000 | MEDICATED_PAD | Freq: Once | CUTANEOUS | Status: DC
Start: 2021-10-08 — End: 2021-10-08

## 2021-10-08 MED ORDER — FUROSEMIDE 20 MG PO TABS
20.0000 mg | ORAL_TABLET | Freq: Every day | ORAL | Status: DC
  Administered 2021-10-09: 20 mg via ORAL
  Filled 2021-10-08: qty 1

## 2021-10-08 MED ORDER — VITAMIN B-12 1000 MCG PO TABS
1000.0000 ug | ORAL_TABLET | Freq: Every day | ORAL | Status: DC
  Administered 2021-10-09: 1000 ug via ORAL
  Filled 2021-10-08: qty 1

## 2021-10-08 MED ORDER — NITROGLYCERIN 0.4 MG SL SUBL: 0.4000 mg | SUBLINGUAL_TABLET | SUBLINGUAL | Status: DC | PRN

## 2021-10-08 MED ORDER — ROCURONIUM BROMIDE 10 MG/ML (PF) SYRINGE
PREFILLED_SYRINGE | INTRAVENOUS | Status: DC | PRN
  Administered 2021-10-08: 70 mg via INTRAVENOUS
  Administered 2021-10-08: 10 mg via INTRAVENOUS

## 2021-10-08 MED ORDER — ORAL CARE MOUTH RINSE: 15.0000 mL | Freq: Once | OROMUCOSAL | Status: AC

## 2021-10-08 MED ORDER — APREPITANT 40 MG PO CAPS
ORAL_CAPSULE | ORAL | Status: AC
  Administered 2021-10-08: 40 mg via ORAL
  Filled 2021-10-08: qty 1

## 2021-10-08 MED ORDER — ONDANSETRON HCL 4 MG/2ML IJ SOLN
INTRAMUSCULAR | Status: DC | PRN
  Administered 2021-10-08: 4 mg via INTRAVENOUS

## 2021-10-08 MED ORDER — CHLORHEXIDINE GLUCONATE CLOTH 2 % EX PADS: 6.0000 | MEDICATED_PAD | Freq: Once | CUTANEOUS | Status: DC

## 2021-10-08 MED ORDER — SENNOSIDES-DOCUSATE SODIUM 8.6-50 MG PO TABS: 1.0000 | ORAL_TABLET | Freq: Every evening | ORAL | Status: DC | PRN

## 2021-10-08 MED ORDER — CEFAZOLIN SODIUM-DEXTROSE 1-4 GM/50ML-% IV SOLN
1.0000 g | Freq: Three times a day (TID) | INTRAVENOUS | Status: AC
Start: 2021-10-08 — End: 2021-10-09
  Administered 2021-10-08 – 2021-10-09 (×2): 1 g via INTRAVENOUS
  Filled 2021-10-08 (×2): qty 50

## 2021-10-08 MED ORDER — MENTHOL 3 MG MT LOZG: 1.0000 | LOZENGE | OROMUCOSAL | Status: DC | PRN

## 2021-10-08 MED ORDER — PHENOL 1.4 % MT LIQD: 1.0000 | OROMUCOSAL | Status: DC | PRN

## 2021-10-08 MED ORDER — PHENYLEPHRINE HCL-NACL 20-0.9 MG/250ML-% IV SOLN
INTRAVENOUS | Status: DC | PRN
Start: 2021-10-08 — End: 2021-10-08
  Administered 2021-10-08: 15 ug/min via INTRAVENOUS

## 2021-10-08 MED ORDER — CEFAZOLIN SODIUM-DEXTROSE 2-4 GM/100ML-% IV SOLN
2.0000 g | INTRAVENOUS | Status: AC
  Administered 2021-10-08: 2 g via INTRAVENOUS
  Filled 2021-10-08: qty 100

## 2021-10-08 MED ORDER — ZOLPIDEM TARTRATE 5 MG PO TABS: 5.0000 mg | ORAL_TABLET | Freq: Every evening | ORAL | Status: DC | PRN

## 2021-10-08 SURGICAL SUPPLY — 61 items
ADH SKN CLS APL DERMABOND .7 (GAUZE/BANDAGES/DRESSINGS) ×1
APL SKNCLS STERI-STRIP NONHPOA (GAUZE/BANDAGES/DRESSINGS)
BAG COUNTER SPONGE SURGICOUNT (BAG) ×2 IMPLANT
BAG SPNG CNTER NS LX DISP (BAG) ×1
BAND INSRT 18 STRL LF DISP RB (MISCELLANEOUS) ×2
BAND RUBBER #18 3X1/16 STRL (MISCELLANEOUS) ×4 IMPLANT
BENZOIN TINCTURE PRP APPL 2/3 (GAUZE/BANDAGES/DRESSINGS) IMPLANT
BLADE CLIPPER SURG (BLADE) IMPLANT
BUR MATCHSTICK NEURO 3.0 LAGG (BURR) ×2 IMPLANT
BUR PRECISION FLUTE 5.0 (BURR) IMPLANT
CANISTER SUCT 3000ML PPV (MISCELLANEOUS) ×2 IMPLANT
CAP RELINE MOD TULIP RMM (Cap) ×4 IMPLANT
CARTRIDGE OIL MAESTRO DRILL (MISCELLANEOUS) ×1 IMPLANT
COVER BACK TABLE 60X90IN (DRAPES) ×1 IMPLANT
DECANTER SPIKE VIAL GLASS SM (MISCELLANEOUS) ×2 IMPLANT
DERMABOND ADVANCED (GAUZE/BANDAGES/DRESSINGS) ×1
DERMABOND ADVANCED .7 DNX12 (GAUZE/BANDAGES/DRESSINGS) ×1 IMPLANT
DIFFUSER DRILL AIR PNEUMATIC (MISCELLANEOUS) ×2 IMPLANT
DRAPE C-ARM 42X72 X-RAY (DRAPES) ×1 IMPLANT
DRAPE C-ARMOR (DRAPES) ×1 IMPLANT
DRAPE LAPAROTOMY 100X72X124 (DRAPES) ×2 IMPLANT
DRAPE MICROSCOPE LEICA (MISCELLANEOUS) ×2 IMPLANT
DRAPE SURG 17X23 STRL (DRAPES) ×2 IMPLANT
DRSG OPSITE POSTOP 4X6 (GAUZE/BANDAGES/DRESSINGS) ×1 IMPLANT
DURAPREP 26ML APPLICATOR (WOUND CARE) ×2 IMPLANT
ELECT REM PT RETURN 9FT ADLT (ELECTROSURGICAL) ×2
ELECTRODE REM PT RTRN 9FT ADLT (ELECTROSURGICAL) ×1 IMPLANT
GAUZE 4X4 16PLY ~~LOC~~+RFID DBL (SPONGE) IMPLANT
GAUZE SPONGE 4X4 12PLY STRL (GAUZE/BANDAGES/DRESSINGS) IMPLANT
GLOVE EXAM NITRILE XL STR (GLOVE) IMPLANT
GLOVE SURG LTX SZ6.5 (GLOVE) ×3 IMPLANT
GOWN STRL REUS W/ TWL LRG LVL3 (GOWN DISPOSABLE) ×2 IMPLANT
GOWN STRL REUS W/ TWL XL LVL3 (GOWN DISPOSABLE) IMPLANT
GOWN STRL REUS W/TWL 2XL LVL3 (GOWN DISPOSABLE) IMPLANT
GOWN STRL REUS W/TWL LRG LVL3 (GOWN DISPOSABLE) ×4
GOWN STRL REUS W/TWL XL LVL3 (GOWN DISPOSABLE)
KIT BASIN OR (CUSTOM PROCEDURE TRAY) ×2 IMPLANT
KIT TURNOVER KIT B (KITS) ×2 IMPLANT
MILL MEDIUM DISP (BLADE) ×1 IMPLANT
NDL HYPO 25X1 1.5 SAFETY (NEEDLE) ×1 IMPLANT
NDL SPNL 18GX3.5 QUINCKE PK (NEEDLE) IMPLANT
NEEDLE HYPO 25X1 1.5 SAFETY (NEEDLE) ×2 IMPLANT
NEEDLE SPNL 18GX3.5 QUINCKE PK (NEEDLE) IMPLANT
NS IRRIG 1000ML POUR BTL (IV SOLUTION) ×2 IMPLANT
OIL CARTRIDGE MAESTRO DRILL (MISCELLANEOUS) ×2
PACK LAMINECTOMY NEURO (CUSTOM PROCEDURE TRAY) ×2 IMPLANT
PAD ARMBOARD 7.5X6 YLW CONV (MISCELLANEOUS) ×6 IMPLANT
ROD RELINE COCR LORD 5.0X35 (Rod) ×1 IMPLANT
ROD RELINE COCR LORD 5X40MM (Rod) ×1 IMPLANT
SCREW LOCK RSS 4.5/5.0MM (Screw) ×4 IMPLANT
SHANK RELINE MOD 5.5X40 (Screw) ×4 IMPLANT
SPONGE SURGIFOAM ABS GEL SZ50 (HEMOSTASIS) ×2 IMPLANT
SPONGE T-LAP 4X18 ~~LOC~~+RFID (SPONGE) IMPLANT
STRIP CLOSURE SKIN 1/2X4 (GAUZE/BANDAGES/DRESSINGS) IMPLANT
SUT VIC AB 0 CT1 18XCR BRD8 (SUTURE) ×1 IMPLANT
SUT VIC AB 0 CT1 8-18 (SUTURE) ×4
SUT VIC AB 2-0 CT1 18 (SUTURE) ×2 IMPLANT
SUT VIC AB 3-0 SH 8-18 (SUTURE) ×2 IMPLANT
TOWEL GREEN STERILE (TOWEL DISPOSABLE) ×2 IMPLANT
TOWEL GREEN STERILE FF (TOWEL DISPOSABLE) ×2 IMPLANT
WATER STERILE IRR 1000ML POUR (IV SOLUTION) ×2 IMPLANT

## 2021-10-08 NOTE — H&P (Signed)
BP (!) 193/85   Pulse 81   Temp (!) 97.4 F (36.3 C) (Oral)   Resp 18   Ht 5\' 10"  (1.778 m)   Wt 83 kg   SpO2 97%   BMI 26.26 kg/m     Derek [name] comes in today because he has been having some problems with urinary continence.  He has had severe pain in the back and left lower extremity, which is now lasting since the beginning of September.  The left leg is numb.  It hurts in his lower back and left hip.  He says the hip hurting was the first sign that he had something going on.  He says Tylenol would sometimes relieve the pain.  He says the pain has gotten worse as time has gone on.  He does have weakness in his left hip flexors, left quads.  He has numbness and tingling in the left lower extremity.     REVIEW OF SYSTEMS :  Positive for hearing loss, swelling in the feet, difficulty starting the urinary stream, leg weakness, back pain, leg pain, anxiety.  He says the swelling in his feet is due to Prednisone.  He stopped the medication. Hearing loss. Use of listening to power tools without ear protection.     PAST SURGICAL HISTORY :  He has undergone a left hip replacement, cataract surgery, and he has had a stent placed in 2007.     MEDICATIONS :  He takes Lasix, Valium, Metoprolol, Allopurinol, Aspirin, Fish Oil, Prilosec, Atorvastatin, Hydralazine. He also takes Iron, Vitamin B12, and Preservision 2 eyedrops daily.     PHYSICAL EXAMINATION :  He has 4- over 5 strength in hip flexors, quadriceps.  Dorsiflexors, plantar flexors are near normal. Hamstrings are better than the quads.  Hip extensors are better than the hip flexors.  He has normal strength in the right lower extremity.  Reflexes are intact at the knees, not intact at the left ankle, 2+ at the right ankle, 2+ in the upper extremities.  Pupils equal, round, and react to light.  Full extraocular movements.  Full visual fields.  Hearing is diminished to voice.  He is 5 feet 10 inches.  Weighs 180 pounds blood  pressure is 125/76, pulse 101, pain is 6/10.     IMAGING :  Derek [name] on his MRI has significant stenosis at L2-3.  He has more than likely a herniated disc eccentric to the left, may have one on the right.  The left is slightly worse than the right side.  The rest of the levels are good.  He does have some mild stenosis at 4-5.  The conus is normal.  The cauda equina is otherwise normal.  No paraspinous lesions or abnormalities that I can detect.     PLAN :  As I explained to Derek [name], given the amount of weakness he is in right now, the fact that he has had some urinary issues, I do not believe anything other than operative decompression is appropriate to recommend from me.  He can obviously choose other standards and treatment plans, but I do believe an operation will help the most and the quickest.  He does not need anything other than a simple laminectomy, and it would be done on both sides, as the stenosis is pronounced.  I would expect him to be able to go home either that day or the next.  Certainly, we will work some with physical therapy.  We are planning to do  this, this upcoming week.  Risks and benefits, bleeding, infection, no relief, need for further surgery, damage to the nerve roots, damage to bowel and bladder function, spinal fluid leak are risks of the procedure, along with other risks.  He understands and wishes to proceed.

## 2021-10-08 NOTE — Anesthesia Procedure Notes (Signed)
Procedure Name: Intubation Date/Time: 10/08/2021 2:43 PM Performed by: Dorthea Cove, CRNA Pre-anesthesia Checklist: Patient identified, Emergency Drugs available, Suction available and Patient being monitored Patient Re-evaluated:Patient Re-evaluated prior to induction Oxygen Delivery Method: Circle system utilized Preoxygenation: Pre-oxygenation with 100% oxygen Induction Type: IV induction Ventilation: Mask ventilation without difficulty Laryngoscope Size: Mac and 4 Grade View: Grade I Tube type: Oral Tube size: 7.5 mm Number of attempts: 1 Airway Equipment and Method: Stylet and Oral airway Placement Confirmation: ETT inserted through vocal cords under direct vision, positive ETCO2 and breath sounds checked- equal and bilateral Secured at: 23 cm Tube secured with: Tape Dental Injury: Teeth and Oropharynx as per pre-operative assessment

## 2021-10-08 NOTE — Anesthesia Postprocedure Evaluation (Signed)
Anesthesia Post Note  Patient: Derek [name]  Procedure(s) Performed: Lumbar Two-Three Laminectomy/Foraminotomy, Posterolateral Arthrodesis, Non-Segmental Instrumentation Screw Fixation     Patient location during evaluation: PACU Anesthesia Type: General Level of consciousness: awake and alert, patient cooperative and oriented Pain management: pain level controlled Vital Signs Assessment: post-procedure vital signs reviewed and stable Respiratory status: spontaneous breathing, nonlabored ventilation and respiratory function stable Cardiovascular status: blood pressure returned to baseline and stable Postop Assessment: no apparent nausea or vomiting Anesthetic complications: no   No notable events documented.  Last Vitals:  Vitals:   10/08/21 1935 10/08/21 2013  BP: (!) 144/83 136/73  Pulse: 72 92  Resp: (!) 23 16  Temp:    SpO2: 94% 100%    Last Pain:  Vitals:   10/08/21 1920  TempSrc:   PainSc: 0-No pain                 Melina Mosteller,E. Amadeo Coke

## 2021-10-08 NOTE — Transfer of Care (Signed)
Immediate Anesthesia Transfer of Care Note  Patient: Derek [name]  Procedure(s) Performed: Lumbar Two-Three Laminectomy/Foraminotomy, Posterolateral Arthrodesis, Non-Segmental Instrumentation Screw Fixation  Patient Location: PACU  Anesthesia Type:General  Level of Consciousness: awake, alert  and oriented  Airway & Oxygen Therapy: Patient Spontanous Breathing  Post-op Assessment: Report given to RN and Post -op Vital signs reviewed and stable  Post vital signs: Reviewed and stable  Last Vitals:  Vitals Value Taken Time  BP    Temp    Pulse 84 10/08/21 1826  Resp 21 10/08/21 1826  SpO2 96 % 10/08/21 1826  Vitals shown include unvalidated device data.  Last Pain:  Vitals:   10/08/21 1029  TempSrc:   PainSc: 0-No pain     Pt. Discussed with Dr. Glennon Mac and Dr. Lissa Hoard at PACU bedside. 500cc bolus infusing.  Patients Stated Pain Goal: 1 (31/49/70 2637)  Complications: No notable events documented.

## 2021-10-08 NOTE — Progress Notes (Signed)
Patient called wife to let her know that his surgery is running behind.  Wife is going to eat and will check back in when she returns.

## 2021-10-08 NOTE — Op Note (Signed)
10/08/2021  6:28 PM  PATIENT:  Derek [name]  75 y.o. male with neurogenic claudication due to spinal stenosis, lateral recess stenosis, and foraminal narrowing  PRE-OPERATIVE DIAGNOSIS:  Spinal stenosis of lumbar region with neurogenic claudication L2/3  POST-OPERATIVE DIAGNOSIS:  Spinal stenosis of lumbar region with neurogenic claudication L2/3  PROCEDURE:  Procedure(s): Lumbar Two-Three Laminectomy/Foraminotomy, Posterolateral Arthrodesis, Non-Segmental Instrumentation Screw Fixation  SURGEON:  Surgeon(s): Ashok Pall, MD Consuella Lose, MD  ASSISTANTS:nundkumar, Nena Polio  ANESTHESIA:   general  EBL:  Total I/O In: 1700 [I.V.:1700] Out: 400 [Blood:400]  BLOOD ADMINISTERED:none  CELL SAVER GIVEN:none  COUNT:per nursing  DRAINS: none   SPECIMEN:  No Specimen  DICTATION: Derek [name] is a 75 y.o. male whom was taken to the operating room intubated, and placed under a general anesthetic without difficulty. A foley catheter was placed under sterile conditions. He was positioned prone on a Wilson frame with all pressure points properly padded.  His lumbar region was prepped and draped in a sterile manner. I infiltrated 10cc's 1/2%lidocaine/1:2000,000 strength epinephrine into the planned incision. I opened the skin with a 10 blade and took the incision down to the thoracolumbar fascia. I exposed the lamina of L2, and 3 in a subperiosteal fashion bilaterally. I confirmed my location with an intraoperative xray.  I placed self retaining retractors and started the decompression.  I decompressed the spinal canal for what was a planned simple laminectomy. However the facets were quite overgrown, and the decompression would have been inadequate to serve the purpose of the operation and to decompress the nerve roots in the lateral recess and the neural foramina bilaterally. I performed inferior facetectomies of L2 bilaterally, and partial superior facetectomies of L3 bilaterally.  The canal was decompressed via a near complete L2 laminectomy, and partial L3 laminectomy. I decorticated the lateral bone at L2, and L3. I then placed autograft morsels on the decorticated surfaces to complete the posterolateral arthrodesis.  We placed pedicle screws at L2, and L3, using fluoroscopic guidance. I drilled a pilot hole, then cannulated the pedicle with a drill at each site. We then tapped each pedicle, assessing each site for pedicle violations. No cutouts were appreciated. Screws (Nuvasive relign) were then placed at each site without difficulty. I attached rods and locking caps with the appropriate tools. The locking caps were secured with torque limited screwdrivers. Final films were performed and the final construct appeared to be in good position.  I closed the wound in a layered fashion. I approximated the thoracolumbar fascia, subcutaneous, and subcuticular planes with vicryl sutures. I used dermabond, and an occulsive bandage for a sterile dressing.     PLAN OF CARE: Admit to inpatient   PATIENT DISPOSITION:  PACU - hemodynamically stable.   Delay start of Pharmacological VTE agent (>24hrs) due to surgical blood loss or risk of bleeding:  yes

## 2021-10-09 ENCOUNTER — Encounter (HOSPITAL_COMMUNITY): Payer: Self-pay | Admitting: Neurosurgery

## 2021-10-09 DIAGNOSIS — M48062 Spinal stenosis, lumbar region with neurogenic claudication: Secondary | ICD-10-CM | POA: Diagnosis not present

## 2021-10-09 DIAGNOSIS — Z79899 Other long term (current) drug therapy: Secondary | ICD-10-CM | POA: Diagnosis not present

## 2021-10-09 DIAGNOSIS — Z96642 Presence of left artificial hip joint: Secondary | ICD-10-CM | POA: Diagnosis not present

## 2021-10-09 DIAGNOSIS — Z7982 Long term (current) use of aspirin: Secondary | ICD-10-CM | POA: Diagnosis not present

## 2021-10-09 DIAGNOSIS — Z20822 Contact with and (suspected) exposure to covid-19: Secondary | ICD-10-CM | POA: Diagnosis not present

## 2021-10-09 MED ORDER — OXYCODONE HCL 5 MG PO TABS: 5.0000 mg | ORAL_TABLET | Freq: Four times a day (QID) | ORAL | 0 refills | Status: AC | PRN

## 2021-10-09 MED ORDER — TIZANIDINE HCL 4 MG PO TABS: 4.0000 mg | ORAL_TABLET | Freq: Four times a day (QID) | ORAL | 0 refills | Status: DC | PRN

## 2021-10-09 NOTE — Care Management CC44 (Signed)
Condition Code 44 Documentation Completed  Patient Details  Name: Derek [name] MRN: [id number] Date of Birth: Nov 15, 1946   Condition Code 44 given:  Yes Patient signature on Condition Code 44 notice:  Yes Documentation of 2 MD's agreement:  Yes Code 44 added to claim:  Yes    Angelita Ingles, RN 10/09/2021, 11:15 AM

## 2021-10-09 NOTE — Care Management Obs Status (Signed)
[facility] NOTIFICATION   Patient Details  Name: Derek [name] MRN: [id number] Date of Birth: 1946/08/07   Medicare Observation Status Notification Given:  Yes    Angelita Ingles, RN 10/09/2021, 11:15 AM

## 2021-10-09 NOTE — Evaluation (Signed)
Occupational Therapy Evaluation and Discharge Patient Details Name: Derek [name] MRN: [id number] DOB: 1946/09/06 Today's Date: 10/09/2021   History of Present Illness 75 y.o. male presents to [facility] hospital 10/08/2021 with severe back pain radiating to LLE. MRI demonstrates significant stenosis at L2-3. Pt underwent L2-3 posterolateral arthrodesis on 10/08/2021. PMH includes HLD, CAD, CKD, HTN, PVD   Clinical Impression   This 75 yo male admitted and underwent above presents to acute with all education completed, we will D/C from acute OT.     Recommendations for follow up therapy are one component of a multi-disciplinary discharge planning process, led by the attending physician.  Recommendations may be updated based on patient status, additional functional criteria and insurance authorization.   Follow Up Recommendations  No OT follow up    Assistance Recommended at Discharge Intermittent Supervision/Assistance  Functional Status Assessment  Patient has had a recent decline in their functional status and demonstrates the ability to make significant improvements in function in a reasonable and predictable amount of time.  Equipment Recommendations  None recommended by OT       Precautions / Restrictions Precautions Precautions: Back Precaution Booklet Issued: Yes (comment) Required Braces or Orthoses:  (no brace needs per orders) Restrictions Weight Bearing Restrictions: No      Mobility Bed Mobility Overal bed mobility: Needs Assistance Bed Mobility: Rolling;Sidelying to Sit Rolling: Supervision Sidelying to sit: Supervision       General bed mobility comments: VCs for technique    Transfers Overall transfer level: Needs assistance Equipment used: Rolling walker (2 wheels) Transfers: Sit to/from Stand Sit to Stand: Min guard           General transfer comment: VCs for safe hand placement      Balance Overall balance assessment: Mild deficits observed, not  formally tested                                         ADL either performed or assessed with clinical judgement   ADL Overall ADL's : Needs assistance/impaired Eating/Feeding: Independent;Sitting   Grooming: Set up;Supervision/safety;Standing Grooming Details (indicate cue type and reason): Educated on use of 2 cups for mouth care Upper Body Bathing: Set up;Sitting   Lower Body Bathing: Min guard;Sit to/from stand Lower Body Bathing Details (indicate cue type and reason): with his long handled sponge he has at home Upper Body Dressing : Set up;Sitting   Lower Body Dressing: Moderate assistance Lower Body Dressing Details (indicate cue type and reason): min guard A sit<>stand Toilet Transfer: Min guard;Ambulation;Rolling walker (2 wheels);Grab bars;Comfort height toilet   Toileting- Clothing Manipulation and Hygiene: Min guard;Sit to/from stand Toileting - Clothing Manipulation Details (indicate cue type and reason): educated on use of wet wipes for back peri care       General ADL Comments: Educated on not sitting more than 20-30 minutes at a time, builidng up to 1 hour     Vision Baseline Vision/History: 0 No visual deficits              Pertinent Vitals/Pain Pain Assessment: No/denies pain     Hand Dominance Right   Extremity/Trunk Assessment Upper Extremity Assessment Upper Extremity Assessment: Overall WFL for tasks assessed     Communication Communication Communication: No difficulties   Cognition Arousal/Alertness: Awake/alert Behavior During Therapy: WFL for tasks assessed/performed Overall Cognitive Status: Within Functional Limits for tasks assessed  Home Living Family/patient expects to be discharged to:: Private residence Living Arrangements: Spouse/significant other Available Help at Discharge: Family;Available 24 hours/day Type of Home: House Home Access:  Stairs to enter [employer] of Steps: 1 and 1 Entrance Stairs-Rails: None Home Layout: Two level;Able to live on main level with bedroom/bathroom     Bathroom Shower/Tub: Walk-in shower;Door   [employer] Toilet: Handicapped height     Home Equipment: Shower seat;Grab bars - tub/shower          Prior Functioning/Environment Prior Level of Function : Needs assist             Mobility Comments: Used a  RW ADLs Comments: A for LBADLs and lifing his left leg in and out of bed        OT Problem List: Decreased range of motion;Impaired balance (sitting and/or standing)         OT Goals(Current goals can be found in the care plan section) Acute Rehab OT Goals Patient Stated Goal: to go home today  OT Frequency:                AM-PAC OT "6 Clicks" Daily Activity     Outcome Measure Help from another person eating meals?: None Help from another person taking care of personal grooming?: A Little Help from another person toileting, which includes using toliet, bedpan, or urinal?: A Little Help from another person bathing (including washing, rinsing, drying)?: A Little Help from another person to put on and taking off regular upper body clothing?: A Little Help from another person to put on and taking off regular lower body clothing?: A Lot 6 Click Score: 18   End of Session Equipment Utilized During Treatment: Rolling walker (2 wheels) Nurse Communication:  (no further OT needs)  Activity Tolerance: Patient tolerated treatment well Patient left: in chair;with call bell/phone within reach  OT Visit Diagnosis: Other abnormalities of gait and mobility (R26.89);Unsteadiness on feet (R26.81)                Time: 6789-3810 OT Time Calculation (min): 37 min Charges:  OT General Charges $OT Visit: 1 Visit OT Evaluation $OT Eval Moderate Complexity: [place], OTR/L Acute [employer] Pager [phone] Office [phone]    Almon Register 10/09/2021, 11:15 AM

## 2021-10-09 NOTE — Discharge Instructions (Signed)

## 2021-10-09 NOTE — Plan of Care (Signed)

## 2021-10-09 NOTE — Progress Notes (Signed)
Patient awaiting transport to his vehicle via wheelchair for discharge home; in no acute distress nor complaints of pain nor discomfort; incision on his back with honeycomb dressing and is clean, dry and intact; room was checked and accounted for all belongings; discharge instructions concerning wound care, medications, follow up appointment and when to call the doctor were all discussed with patient by RN and he expressed understanding on the instructions given.

## 2021-10-09 NOTE — Discharge Summary (Signed)
Physician Discharge Summary  Patient ID: Derek [name] MRN: [id number] DOB/AGE: 76/03/1946 76 y.o.  Admit date: 10/08/2021 Discharge date: 10/09/2021  Admission Diagnoses:lumbar stenosis with neurogenic claudication  Discharge Diagnoses:  Active Problems:   Lumbar stenosis with neurogenic claudication   Discharged Condition: good  Hospital Course: [name] was admitted and taken to the operating room for a lumbar decompression. Intraoperatively it was apparent that an adequate decompression was not possible without encroaching upon the arthropathic facets. I made the decision to perform facetectomies to allow for an adequate decompression.  Post op he is voiding, ambulating, and tolerating a regular diet His wound is clean, dry, and without signs of infection  Treatments: surgery: Lumbar Two-Three Laminectomy/Foraminotomy, Posterolateral Arthrodesis, Non-Segmental Instrumentation Screw Fixation  Discharge Exam: Blood pressure 105/82, pulse 83, temperature 97.9 F (36.6 C), resp. rate 18, height 5\' 10"  (1.778 m), weight 83 kg, SpO2 95 %. General appearance: alert, cooperative, appears stated age, and mild distress  Disposition: Discharge disposition: 01-Home or Self Care      Spinal stenosis of lumbar region with neurogenic claudication  Allergies as of 10/09/2021       Reactions   Ibuprofen Other (See Comments)   One kidney Pt has one Kidney    Nsaids    Prednisone Swelling   Causes swelling in legs and feet         Medication List     TAKE these medications    acetaminophen 500 MG tablet Commonly known as: TYLENOL Take 500-1,000 mg by mouth every 6 (six) hours as needed for moderate pain.   allopurinol 100 MG tablet Commonly known as: ZYLOPRIM Take 200 mg by mouth daily.   aspirin EC 81 MG tablet Take 81 mg by mouth daily. Swallow whole.   atorvastatin 20 MG tablet Commonly known as: LIPITOR Take 1 tablet (20 mg total) by mouth daily.    diazepam 2 MG tablet Commonly known as: VALIUM Take 2 mg by mouth 2 (two) times daily as needed for anxiety.   ferrous sulfate 325 (65 FE) MG tablet Take 325 mg by mouth daily with breakfast.   Fish Oil Maximum Strength 1200 MG Caps Take 1,200 mg by mouth in the morning and at bedtime.   furosemide 40 MG tablet Commonly known as: LASIX Take 20 mg by mouth daily.   hydrALAZINE 25 MG tablet Commonly known as: APRESOLINE TAKE 1 TABLET BY MOUTH THREE TIMES DAILY   metoprolol succinate 25 MG 24 hr tablet Commonly known as: TOPROL-XL Take 1 tablet by mouth once daily   nitroGLYCERIN 0.4 MG SL tablet Commonly known as: NITROSTAT Place 1 tablet (0.4 mg total) under the tongue every 5 (five) minutes x 3 doses as needed (if no relief after 3rd dose, proceed to the ED for an evaluation). For chest pain. If no relief after 3 rd dose, proceed to the ED for an evaluation.   omeprazole 20 MG capsule Commonly known as: PRILOSEC Take 20 mg by mouth daily.   oxyCODONE-acetaminophen 5-325 MG tablet Commonly known as: PERCOCET/ROXICET Take 0.25 tablets by mouth every 6 (six) hours as needed for severe pain.   Potassium 99 MG Tabs Take 99 mg by mouth daily as needed (cramping in legs).   PRESERVISION AREDS PO Take 1 tablet by mouth in the morning and at bedtime.   tiZANidine 4 MG tablet Commonly known as: ZANAFLEX Take 1 tablet (4 mg total) by mouth every 6 (six) hours as needed for muscle spasms.   vitamin B-12 1000  MCG tablet Commonly known as: CYANOCOBALAMIN Take 1,000 mcg by mouth daily.        Follow-up Information     Ashok Pall, MD Follow up in 3 week(s).   Specialty: Neurosurgery Why: please call to make an appointmennt Contact information: [address] [place] [address] [phone]                 Signed: Ashok Pall 10/09/2021, 1:36 PM

## 2021-10-09 NOTE — Evaluation (Signed)
Physical Therapy Evaluation Patient Details Name: Derek [name] MRN: [id number] DOB: 1946-01-04 Today's Date: 10/09/2021  History of Present Illness  74 y.o. male presents to [facility] hospital 10/08/2021 with severe back pain radiating to LLE. MRI demonstrates significant stenosis at L2-3. Pt underwent L2-3 posterolateral arthrodesis on 10/08/2021. PMH includes HLD, CAD, CKD, HTN, PVD  Clinical Impression  Pt presents to PT with deficits in LLE strength, gait, balance, power, endurance. Pt reports improvement in LLE strength compared to pre-surgery, however significant weakness remains from baseline. Pt is able to perform all functional mobility required in the home with support of RW. 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 Acute PT will continue to follow during admission.       Recommendations for follow up therapy are one component of a multi-disciplinary discharge planning process, led by the attending physician.  Recommendations may be updated based on patient status, additional functional criteria and insurance authorization.  Follow Up Recommendations  (pt is declining PT follow-up despite PT recommendation for outpatient PT)    Assistance Recommended at Discharge Intermittent Supervision/Assistance  Functional Status Assessment Patient has had a recent decline in their functional status and demonstrates the ability to make significant improvements in function in a reasonable and predictable amount of time.  Equipment Recommendations  None recommended by PT (pt owns necessary DME)    Recommendations for Other Services       Precautions / Restrictions Precautions Precautions: Back Precaution Booklet Issued: Yes (comment) Required Braces or Orthoses:  (no brace needs per orders) Restrictions Weight Bearing Restrictions: No      Mobility  Bed  Mobility Overal bed mobility: Needs Assistance Bed Mobility: Rolling;Sidelying to Sit Rolling: Supervision Sidelying to sit: Supervision       General bed mobility comments: VCs for technique    Transfers Overall transfer level: Needs assistance Equipment used: Rolling walker (2 wheels) Transfers: Sit to/from Stand Sit to Stand: Min guard           General transfer comment: VCs for safe hand placement    Ambulation/Gait Ambulation/Gait assistance: Supervision Gait Distance (Feet): 250 Feet Assistive device: Rolling walker (2 wheels) Gait Pattern/deviations: Step-through pattern Gait velocity: functional Gait velocity interpretation: 1.31 - 2.62 ft/sec, indicative of limited community ambulator General Gait Details: one instance of L knee hyperextension, pt with increased L knee and hip flexion at times during gait cycle however no foot drop noted  Stairs Stairs: Yes Stairs assistance: Modified independent (Device/Increase time) Stair Management: Two rails;Step to pattern Number of Stairs: 3    Wheelchair Mobility    Modified Rankin (Stroke Patients Only)       Balance Overall balance assessment: Mild deficits observed, not formally tested                                           Pertinent Vitals/Pain Pain Assessment: No/denies pain    Home Living Family/patient expects to be discharged to:: Private residence Living Arrangements: Spouse/significant other Available Help at Discharge: Family;Available 24 hours/day Type of Home: House Home Access: Stairs to enter Entrance Stairs-Rails: None Entrance Stairs-Number of Steps: 1 and 1   Home Layout: Two level;Able to live on main level with bedroom/bathroom Home Equipment: Shower seat;Grab bars - tub/shower      Prior Function  Prior Level of Function : Needs assist             Mobility Comments: Used a  RW ADLs Comments: A for LBADLs and lifing his left leg in and out of bed      Hand Dominance   Dominant Hand: Right    Extremity/Trunk Assessment   Upper Extremity Assessment Upper Extremity Assessment: Overall WFL for tasks assessed    Lower Extremity Assessment Lower Extremity Assessment: LLE deficits/detail LLE Deficits / Details: grossly 4-/5    Cervical / Trunk Assessment Cervical / Trunk Assessment: Back Surgery  Communication   Communication: No difficulties  Cognition Arousal/Alertness: Awake/alert Behavior During Therapy: WFL for tasks assessed/performed Overall Cognitive Status: Within Functional Limits for tasks assessed                                          General Comments General comments (skin integrity, edema, etc.): VSS on RA    Exercises     Assessment/Plan    PT Assessment Patient needs continued PT services  PT Problem List Decreased strength;Decreased activity tolerance;Decreased balance       PT Treatment Interventions Gait training;Stair training;Functional mobility training;Therapeutic activities;Therapeutic exercise;Balance training;Neuromuscular re-education;Patient/family education    PT Goals (Current goals can be found in the Care Plan section)  Acute Rehab PT Goals Patient Stated Goal: to go home PT Goal Formulation: With patient Time For Goal Achievement: 10/13/21 Potential to Achieve Goals: Good    Frequency Min 5X/week   Barriers to discharge        Co-evaluation               AM-PAC PT "6 Clicks" Mobility  Outcome Measure Help needed turning from your back to your side while in a flat bed without using bedrails?: A Little Help needed moving from lying on your back to sitting on the side of a flat bed without using bedrails?: A Little Help needed moving to and from a bed to a chair (including a wheelchair)?: A Little Help needed standing up from a chair using your arms (e.g., wheelchair or bedside chair)?: A Little Help needed to walk in hospital room?: A Little Help  needed climbing 3-5 steps with a railing? : A Little 6 Click Score: 18    End of Session   Activity Tolerance: Patient tolerated treatment well Patient left: in chair;with call bell/phone within reach Nurse Communication: Mobility status PT Visit Diagnosis: Muscle weakness (generalized) (M62.81)    Time: 4656-8127 PT Time Calculation (min) (ACUTE ONLY): 19 min   Charges:   PT Evaluation $PT Eval Low Complexity: [place], PT, DPT Acute Rehabilitation Pager: [phone] Office [phone]   Zenaida Niece 10/09/2021, 10:10 AM

## 2021-10-13 ENCOUNTER — Encounter (HOSPITAL_COMMUNITY): Payer: Medicare Other

## 2021-10-13 ENCOUNTER — Ambulatory Visit: Payer: Medicare Other | Admitting: Vascular Surgery

## 2021-10-26 DIAGNOSIS — Z85828 Personal history of other malignant neoplasm of skin: Secondary | ICD-10-CM | POA: Diagnosis not present

## 2021-10-26 DIAGNOSIS — L281 Prurigo nodularis: Secondary | ICD-10-CM | POA: Diagnosis not present

## 2021-10-26 DIAGNOSIS — D692 Other nonthrombocytopenic purpura: Secondary | ICD-10-CM | POA: Diagnosis not present

## 2021-10-26 DIAGNOSIS — L570 Actinic keratosis: Secondary | ICD-10-CM | POA: Diagnosis not present

## 2021-10-26 DIAGNOSIS — L821 Other seborrheic keratosis: Secondary | ICD-10-CM | POA: Diagnosis not present

## 2021-10-26 DIAGNOSIS — I788 Other diseases of capillaries: Secondary | ICD-10-CM | POA: Diagnosis not present

## 2021-10-26 DIAGNOSIS — L820 Inflamed seborrheic keratosis: Secondary | ICD-10-CM | POA: Diagnosis not present

## 2021-10-26 DIAGNOSIS — D225 Melanocytic nevi of trunk: Secondary | ICD-10-CM | POA: Diagnosis not present

## 2021-10-28 DIAGNOSIS — Z23 Encounter for immunization: Secondary | ICD-10-CM | POA: Diagnosis not present

## 2021-11-26 ENCOUNTER — Ambulatory Visit: Payer: Medicare Other | Admitting: Orthopedic Surgery

## 2021-11-26 DIAGNOSIS — M21372 Foot drop, left foot: Secondary | ICD-10-CM | POA: Diagnosis not present

## 2021-11-27 ENCOUNTER — Encounter: Payer: Self-pay | Admitting: Orthopedic Surgery

## 2021-11-27 NOTE — Progress Notes (Signed)
Office Visit Note   Patient: Derek [name]           Date of Birth: 03/04/1946           MRN: [id number] Visit Date: 11/26/2021              Requested by: Celene Squibb, MD [place],  [place] [address] PCP: Celene Squibb, MD  Chief Complaint  Patient presents with   Left Leg - Follow-up      HPI: Patient is a 75 year old gentleman who is seen for foot drop and weakness on the left.  Patient also states he has been having increased lymphedema swelling and has been using compression stockings.  Patient states that yesterday he almost fell because his leg gave way.  Patient states that he underwent a lumbar spine fusion at L3-4 and 5 on 10/08/2021 with Dr. Christella Noa.  Patient states that after surgery he had the weakness and foot drop on the left.  Assessment & Plan: Visit Diagnoses:  1. Foot drop, left     Plan: Patient was given an anterior foot orthosis prescription with [facility] on [employer].  Patient may require further bracing.  Reevaluate in 2 months.  Follow-Up Instructions: Return in about 2 months (around 01/27/2022).   Ortho Exam  Patient is alert, oriented, no adenopathy, well-dressed, normal affect, normal respiratory effort. Examination patient has a good dorsalis pedis pulse he does have pitting edema involving the entire left lower extremity there is no brawny skin color changes no signs of any venous insufficiency.  Patient cannot lift his left leg.  He has no EHL strength and weakness with dorsiflexion of the ankle.  He has good plantarflexion strength of the foot and ankle.  Weakness with knee extension and hip flexion.  Imaging: No results found. No images are attached to the encounter.  Labs: Lab Results  Component Value Date   ESRSEDRATE 13 08/05/2006     Lab Results  Component Value Date   ALBUMIN 3.9 09/03/2021   ALBUMIN 3.4 (L) 04/04/2020   ALBUMIN 3.5 03/15/2012    No results found for: MG No results found for:  VD25OH  No results found for: PREALBUMIN CBC EXTENDED Latest Ref Rng & Units 09/24/2021 09/03/2021 01/28/2021  WBC 4.0 - 10.5 K/uL 6.5 6.7 5.2  RBC 4.22 - 5.81 MIL/uL 4.91 5.38 5.01  HGB 13.0 - 17.0 g/dL 13.8 15.3 13.5  HCT 39.0 - 52.0 % 42.5 49.3 40.7  PLT 150 - 400 K/uL 195 125(L) 115(L)  NEUTROABS 1.7 - 7.7 K/uL - - -  LYMPHSABS 0.7 - 4.0 K/uL - - -     There is no height or weight on file to calculate BMI.  Orders:  No orders of the defined types were placed in this encounter.  No orders of the defined types were placed in this encounter.    Procedures: No procedures performed  Clinical Data: No additional findings.  ROS:  All other systems negative, except as noted in the HPI. Review of Systems  Objective: Vital Signs: There were no vitals taken for this visit.  Specialty Comments:  No specialty comments available.  PMFS History: Patient Active Problem List   Diagnosis Date Noted   Lumbar stenosis with neurogenic claudication 10/08/2021   Leg swelling 09/08/2021   Carotid stenosis, asymptomatic, left 04/07/2020   PVD (peripheral vascular disease) ([facility]) 09/05/2018   Obstruction of right carotid artery without cerebral infarction 05/27/2017   Carotid disease, bilateral (  [facility]) 05/27/2017   Hx of colonic polyps 01/01/2015   Cervical pain 06/06/2014   Stiffness in joint 06/06/2014   Cubital tunnel syndrome on left 11/28/2012    Class: Chronic   Carpal tunnel syndrome on left 11/28/2012    Class: Chronic   CKD (chronic kidney disease) stage 3, GFR 30-59 ml/min (HCC) 09/20/2012   Essential hypertension, benign 09/20/2012   Mixed hyperlipidemia 09/03/2009   CAD, NATIVE VESSEL 09/03/2009   Past Medical History:  Diagnosis Date   Anxiety    Arthritis    Atypical atrial flutter (HCC)    Carotid artery disease (HCC)    Cataract    Chronic idiopathic thrombocytopenia (HCC)    Chronic renal insufficiency    Single kidney   Complication of anesthesia     Coronary atherosclerosis of native coronary vessel    DES RCA 2007   Depression    Essential hypertension    GERD (gastroesophageal reflux disease)    Hx of colonic polyps 01/01/2015   Mixed hyperlipidemia    Myocardial infarction ([facility]) 2006-07-05   STEMI    Peripheral vascular disease (HCC)    PONV (postoperative nausea and vomiting)     Family History  Problem Relation Age of Onset   Stroke Other    Coronary artery disease Other    Colon cancer Brother        has colostomy    Esophageal cancer Neg Hx    Stomach cancer Neg Hx    Rectal cancer Neg Hx    Colon polyps Neg Hx     Past Surgical History:  Procedure Laterality Date   CARDIAC CATHETERIZATION  2010   Patent stent and nonobsturctive cad following an abnormal cardiolite study may of 2010 with an apical lateral defect   CARPAL TUNNEL RELEASE  11/28/2012   Procedure: CARPAL TUNNEL RELEASE;  Surgeon: Jessy Oto, MD;  Location: [facility];  Service: Orthopedics;  Laterality: Left;  Left anterior submuscular transposition of ulnar nerve at elbow, left open carpal tunnel release   COLONOSCOPY     ELBOW SURGERY Left 2013   ENDARTERECTOMY Left 04/07/2020   Procedure: LEFT CAROTID ENDARTERECTOMY;  Surgeon: Marty Heck, MD;  Location: [facility];  Service: Vascular;  Laterality: Left;   [place] REMOVAL  03/20/2012   Procedure: HARDWARE REMOVAL;  Surgeon: Jessy Oto, MD;  Location: [facility];  Service: Orthopedics;  Laterality: Left;  Hardware removal 3 knowles pins left hip   HERNIA REPAIR     JOINT REPLACEMENT     LUMBAR LAMINECTOMY/DECOMPRESSION MICRODISCECTOMY N/A 10/08/2021   Procedure: Lumbar Two-Three Laminectomy/Foraminotomy, Posterolateral Arthrodesis, Non-Segmental Instrumentation Screw Fixation;  Surgeon: Ashok Pall, MD;  Location: [facility];  Service: Neurosurgery;  Laterality: N/A;   PATCH ANGIOPLASTY Left 04/07/2020   Procedure: Patch  Angioplasty Left Carotid;  Surgeon: Marty Heck, MD;  Location: [facility];  Service: Vascular;  Laterality: Left;   TONSILLECTOMY     TOTAL HIP ARTHROPLASTY  03/20/2012   Procedure: TOTAL HIP ARTHROPLASTY;  Surgeon: Jessy Oto, MD;  Location: [facility];  Service: Orthopedics;  Laterality: Left;  Left total hip replacement, ceramic on poly   Social History   Occupational History   Not on file  Tobacco Use   Smoking status: Former    Packs/day: 3.00    Years: 42.00    Pack years: 126.00  Types: Cigarettes    Start date: 10/30/1960    Quit date: 07/23/2004    Years since quitting: 17.3   Smokeless tobacco: Never   Tobacco comments:    Counseled to remain smoke free  Vaping Use   Vaping Use: Never used  Substance and Sexual Activity   Alcohol use: No    Alcohol/week: 0.0 standard drinks   Drug use: No   Sexual activity: Not on file

## 2021-12-22 ENCOUNTER — Ambulatory Visit: Payer: Medicare Other | Admitting: Vascular Surgery

## 2021-12-22 ENCOUNTER — Encounter (HOSPITAL_COMMUNITY): Payer: Medicare Other

## 2021-12-25 ENCOUNTER — Ambulatory Visit: Payer: Medicare Other | Admitting: Cardiology

## 2021-12-29 DIAGNOSIS — M48062 Spinal stenosis, lumbar region with neurogenic claudication: Secondary | ICD-10-CM | POA: Diagnosis not present

## 2021-12-31 DIAGNOSIS — M21372 Foot drop, left foot: Secondary | ICD-10-CM | POA: Diagnosis not present

## 2022-01-05 DIAGNOSIS — R200 Anesthesia of skin: Secondary | ICD-10-CM | POA: Diagnosis not present

## 2022-01-05 DIAGNOSIS — M48062 Spinal stenosis, lumbar region with neurogenic claudication: Secondary | ICD-10-CM | POA: Diagnosis not present

## 2022-01-07 DIAGNOSIS — M48062 Spinal stenosis, lumbar region with neurogenic claudication: Secondary | ICD-10-CM | POA: Diagnosis not present

## 2022-01-07 DIAGNOSIS — I10 Essential (primary) hypertension: Secondary | ICD-10-CM | POA: Diagnosis not present

## 2022-01-14 ENCOUNTER — Ambulatory Visit: Payer: Medicare Other | Admitting: Student

## 2022-01-14 DIAGNOSIS — N281 Cyst of kidney, acquired: Secondary | ICD-10-CM | POA: Diagnosis not present

## 2022-01-14 DIAGNOSIS — N261 Atrophy of kidney (terminal): Secondary | ICD-10-CM | POA: Diagnosis not present

## 2022-01-14 DIAGNOSIS — N1831 Chronic kidney disease, stage 3a: Secondary | ICD-10-CM | POA: Diagnosis not present

## 2022-01-18 DIAGNOSIS — M5416 Radiculopathy, lumbar region: Secondary | ICD-10-CM | POA: Diagnosis not present

## 2022-01-18 DIAGNOSIS — R29898 Other symptoms and signs involving the musculoskeletal system: Secondary | ICD-10-CM | POA: Diagnosis not present

## 2022-01-18 DIAGNOSIS — R200 Anesthesia of skin: Secondary | ICD-10-CM | POA: Diagnosis not present

## 2022-01-21 ENCOUNTER — Encounter (HOSPITAL_COMMUNITY): Payer: Self-pay | Admitting: Radiology

## 2022-01-22 DIAGNOSIS — H35363 Drusen (degenerative) of macula, bilateral: Secondary | ICD-10-CM | POA: Diagnosis not present

## 2022-01-26 DIAGNOSIS — R29898 Other symptoms and signs involving the musculoskeletal system: Secondary | ICD-10-CM | POA: Diagnosis not present

## 2022-01-26 DIAGNOSIS — I10 Essential (primary) hypertension: Secondary | ICD-10-CM | POA: Diagnosis not present

## 2022-02-05 DIAGNOSIS — M19012 Primary osteoarthritis, left shoulder: Secondary | ICD-10-CM | POA: Diagnosis not present

## 2022-02-05 DIAGNOSIS — S80212A Abrasion, left knee, initial encounter: Secondary | ICD-10-CM | POA: Diagnosis not present

## 2022-02-05 DIAGNOSIS — S46011A Strain of muscle(s) and tendon(s) of the rotator cuff of right shoulder, initial encounter: Secondary | ICD-10-CM | POA: Diagnosis not present

## 2022-02-05 DIAGNOSIS — W19XXXA Unspecified fall, initial encounter: Secondary | ICD-10-CM | POA: Diagnosis not present

## 2022-02-05 DIAGNOSIS — Z87891 Personal history of nicotine dependence: Secondary | ICD-10-CM | POA: Diagnosis not present

## 2022-02-05 DIAGNOSIS — J3489 Other specified disorders of nose and nasal sinuses: Secondary | ICD-10-CM | POA: Diagnosis not present

## 2022-02-05 DIAGNOSIS — S80211A Abrasion, right knee, initial encounter: Secondary | ICD-10-CM | POA: Diagnosis not present

## 2022-02-05 DIAGNOSIS — R6889 Other general symptoms and signs: Secondary | ICD-10-CM | POA: Diagnosis not present

## 2022-02-05 DIAGNOSIS — W010XXA Fall on same level from slipping, tripping and stumbling without subsequent striking against object, initial encounter: Secondary | ICD-10-CM | POA: Diagnosis not present

## 2022-02-05 DIAGNOSIS — S0990XA Unspecified injury of head, initial encounter: Secondary | ICD-10-CM | POA: Diagnosis not present

## 2022-02-05 DIAGNOSIS — S4982XA Other specified injuries of left shoulder and upper arm, initial encounter: Secondary | ICD-10-CM | POA: Diagnosis not present

## 2022-02-05 DIAGNOSIS — S4992XA Unspecified injury of left shoulder and upper arm, initial encounter: Secondary | ICD-10-CM | POA: Diagnosis not present

## 2022-02-05 DIAGNOSIS — M79641 Pain in right hand: Secondary | ICD-10-CM | POA: Diagnosis not present

## 2022-02-05 DIAGNOSIS — Z743 Need for continuous supervision: Secondary | ICD-10-CM | POA: Diagnosis not present

## 2022-02-05 DIAGNOSIS — S0083XA Contusion of other part of head, initial encounter: Secondary | ICD-10-CM | POA: Diagnosis not present

## 2022-02-05 DIAGNOSIS — S0033XA Contusion of nose, initial encounter: Secondary | ICD-10-CM | POA: Diagnosis not present

## 2022-02-15 ENCOUNTER — Other Ambulatory Visit (HOSPITAL_COMMUNITY): Payer: Self-pay | Admitting: Family Medicine

## 2022-02-15 ENCOUNTER — Other Ambulatory Visit: Payer: Self-pay | Admitting: Family Medicine

## 2022-02-15 DIAGNOSIS — I482 Chronic atrial fibrillation, unspecified: Secondary | ICD-10-CM | POA: Diagnosis not present

## 2022-02-15 DIAGNOSIS — Z9181 History of falling: Secondary | ICD-10-CM | POA: Diagnosis not present

## 2022-02-15 DIAGNOSIS — S0083XD Contusion of other part of head, subsequent encounter: Secondary | ICD-10-CM | POA: Diagnosis not present

## 2022-02-15 DIAGNOSIS — R600 Localized edema: Secondary | ICD-10-CM | POA: Diagnosis not present

## 2022-02-15 DIAGNOSIS — I10 Essential (primary) hypertension: Secondary | ICD-10-CM | POA: Diagnosis not present

## 2022-02-16 ENCOUNTER — Other Ambulatory Visit: Payer: Self-pay | Admitting: Family Medicine

## 2022-02-16 ENCOUNTER — Other Ambulatory Visit: Payer: Self-pay

## 2022-02-16 ENCOUNTER — Ambulatory Visit (HOSPITAL_COMMUNITY)
Admission: RE | Admit: 2022-02-16 | Discharge: 2022-02-16 | Disposition: A | Discharge status: Home / Self Care | Payer: Medicare Other | Source: Ambulatory Visit | Attending: Family Medicine | Admitting: Family Medicine

## 2022-02-16 ENCOUNTER — Other Ambulatory Visit (HOSPITAL_COMMUNITY): Payer: Self-pay | Admitting: Family Medicine

## 2022-02-16 DIAGNOSIS — R600 Localized edema: Secondary | ICD-10-CM

## 2022-02-23 DIAGNOSIS — M48062 Spinal stenosis, lumbar region with neurogenic claudication: Secondary | ICD-10-CM | POA: Diagnosis not present

## 2022-02-23 DIAGNOSIS — R29898 Other symptoms and signs involving the musculoskeletal system: Secondary | ICD-10-CM | POA: Diagnosis not present

## 2022-02-23 DIAGNOSIS — I10 Essential (primary) hypertension: Secondary | ICD-10-CM | POA: Diagnosis not present

## 2022-02-25 NOTE — Progress Notes (Signed)
? ?Cardiology Office Note   ? ?Date:  02/27/2022  ? ?ID:  [name], DOB Feb 26, 1946, MRN [id number] ? ?PCP:  Celene Squibb, MD  ?Cardiologist: [name], MD   ? ?Chief Complaint  ?Patient presents with  ? Follow-up  ?  6 month visit  ? ? ?History of Present Illness:   ? ?Derek [name] is a 76 y.o. male with past medical history of CAD (s/p DES to RCA in 2007), persistent atrial flutter (has refused anticoagulation), HTN, HLD, carotid artery stenosis (known right carotid artery occlusion, s/p L CEA in 03/2020) and Stage 3 CKD who presents to the office today for 54-monthfollow-up.  ? ?He was last examined by Dr. MDomenic Politein 06/2021 and denied any recent palpitations or anginal symptoms. He refused anticoagulation due to concerns with bleeding and was continued on ASA '81mg'$  daily, Toprol-XL '25mg'$  daily, Hydralazine '25mg'$  TID and Atorvastatin '20mg'$  daily.  ? ?In talking with the patient today, he reports his activity has been limited over the past several months as he injured his back last year and required fasciotomies in 09/2021. He reports his back pain has improved but he has been experiencing intermittent paraesthesias down his left leg and is being followed by Orthopedics and Neurology. He denies any specific exertional chest pain when being active at home. Says that he did experience an episode of chest pain approximately 1 month ago which occurred at rest and did improve with the use of nitroglycerin. He denies any specific orthopnea or PND.  He does have chronic lower extremity edema and utilizes compression stockings daily along with taking Lasix. Says that he did stop Toprol-XL almost a month ago as he read that this interacted with Hydralazine. ? ? ?Past Medical History:  ?Diagnosis Date  ? Anxiety   ? Arthritis   ? Atypical [place] flutter ([place]   ? CAD (coronary artery disease)   ? a. s/p DES to RCA in 2007 b. s/p NST in 10/2015 which was intermediate risk and pt preferred medical management at that  time  ? Carotid artery disease ([place]   ? Cataract   ? Chronic idiopathic thrombocytopenia (HCC)   ? Chronic renal insufficiency   ? Single kidney  ? Complication of anesthesia   ? Coronary atherosclerosis of native coronary vessel   ? DES RCA 2007  ? Depression   ? Essential hypertension   ? GERD (gastroesophageal reflux disease)   ? Hx of colonic polyps 01/01/2015  ? Mixed hyperlipidemia   ? Myocardial infarction [facility] 2006[facility]07-05  ? STEMI   ? [place]ipheral vascular disease ([place]   ? PONV (postoperative nausea and vomiting)   ? ? ?Past Surgical History:  ?Procedure Laterality Date  ? CARDIAC CATHETERIZATION  2010  ? Patent stent and nonobsturctive cad following an abnormal cardiolite study may of 2010 with an apical lateral defect  ? CARPAL TUNNEL RELEASE  11/28/2012  ? Procedure: CARPAL TUNNEL RELEASE;  Surgeon: JJessy Oto MD;  [place]  Service: Orthopedics;  Laterality: Left;  Left anterior submuscular transposition of ulnar nerve at elbow, left open carpal tunnel release  ? COLONOSCOPY    ? ELBOW SURGERY Left 2013  ? ENDARTERECTOMY Left 04/07/2020  ? Procedure: LEFT CAROTID ENDARTERECTOMY;  Surgeon: CMarty Heck MD;  [place]  Service: Vascular;  Laterality: Left;  ? EYE SURGERY    ? [place] screws 1962  ? FRACTURE SURGERY    ? HARDWARE REMOVAL  03/20/2012  ? Procedure: HARDWARE REMOVAL;  Surgeon: Jessy Oto, MD;  Location: [facility];  Service: Orthopedics;  Laterality: Left;  Hardware removal 3 knowles pins left hip  ? HERNIA REPAIR    ? JOINT REPLACEMENT    ? LUMBAR LAMINECTOMY/DECOMPRESSION MICRODISCECTOMY N/A 10/08/2021  ? Procedure: Lumbar Two-Three Laminectomy/Foraminotomy, Posterolateral Arthrodesis, Non-Segmental Instrumentation Screw Fixation;  Surgeon: Ashok Pall, MD;  Location: [place];  Service: Neurosurgery;  Laterality: N/A;  ? PATCH ANGIOPLASTY Left 04/07/2020  ? Procedure: Patch Angioplasty Left Carotid;  Surgeon: Marty Heck, MD;  Location:  [place];  Service: Vascular;  Laterality: Left;  ? TONSILLECTOMY    ? TOTAL HIP ARTHROPLASTY  03/20/2012  ? Procedure: TOTAL HIP ARTHROPLASTY;  Surgeon: Jessy Oto, MD;  Location: [facility] [place];  Service: Orthopedics;  Laterality: Left;  Left total hip replacement, ceramic on poly  ? ? ?Current Medications: ?Outpatient Medications Prior to Visit  ?Medication Sig Dispense Refill  ? acetaminophen (TYLENOL) 500 MG tablet Take 500-1,000 mg by mouth every 6 (six) hours as needed for moderate pain.    ? allopurinol (ZYLOPRIM) 100 MG tablet Take 200 mg by mouth daily.    ? aspirin EC 81 MG tablet Take 81 mg by mouth daily. Swallow whole.    ? atorvastatin (LIPITOR) 20 MG tablet Take 1 tablet (20 mg total) by mouth daily. 30 tablet 9  ? diazepam (VALIUM) 2 MG tablet Take 2 mg by mouth 2 (two) times daily as needed for anxiety.    ? ferrous sulfate 325 (65 FE) MG tablet Take 325 mg by mouth daily with breakfast.    ? furosemide (LASIX) 40 MG tablet Take 20 mg by mouth daily.    ? hydrALAZINE (APRESOLINE) 25 MG tablet TAKE 1 TABLET BY MOUTH THREE TIMES DAILY 180 tablet 2  ? Multiple Vitamins-Minerals (PRESERVISION AREDS PO) Take 1 tablet by mouth in the morning and at bedtime.    ? nitroGLYCERIN (NITROSTAT) 0.4 MG SL tablet Place 1 tablet (0.4 mg total) under the tongue every 5 (five) minutes x 3 doses as needed (if no relief after 3rd dose, proceed to the ED for an evaluation). For chest pain. If no relief after 3 rd dose, proceed to the ED for an evaluation. 25 tablet 3  ? Omega-3 Fatty Acids (FISH OIL MAXIMUM STRENGTH) 1200 MG CAPS Take 1,200 mg by mouth in the morning and at bedtime.    ? omeprazole (PRILOSEC) 20 MG capsule Take 20 mg by mouth daily.      ? oxyCODONE-acetaminophen (PERCOCET/ROXICET) 5-325 MG tablet Take 0.25 tablets by mouth every 6 (six) hours as needed for severe pain.    ? Potassium 99 MG TABS Take 99 mg by mouth daily as needed (cramping in legs).    ? tiZANidine (ZANAFLEX) 4 MG tablet Take 1 tablet (4 mg  total) by mouth every 6 (six) hours as needed for muscle spasms. 60 tablet 0  ? vitamin B-12 (CYANOCOBALAMIN) 1000 MCG tablet Take 1,000 mcg by mouth daily.    ? metoprolol succinate (TOPROL-XL) 25 MG 24 hr tablet Take 1 tablet by mouth once daily (Patient not taking: Reported on 02/26/2022) 90 tablet 2  ? ?No facility-administered medications prior to visit.  ?  ? ?Allergies:   Ibuprofen, Nsaids, and Prednisone  ? ?Social History  ? ?Socioeconomic History  ? Marital status: Married  ?  Spouse name: Not on file  ? Number of children: Not on file  ? Years of education: Not on file  ?  Highest education level: Not on file  ?Occupational History  ? Not on file  ?Tobacco Use  ? Smoking status: Former  ?  Packs/day: 3.00  ?  Years: 42.00  ?  Pack years: 126.00  ?  Types: Cigarettes  ?  Start date: 10/30/1960  ?  Quit date: 07/23/2004  ?  Years since quitting: 17.6  ? Smokeless tobacco: Never  ? Tobacco comments:  ?  Counseled to remain smoke free  ?Vaping Use  ? Vaping Use: Never used  ?Substance and Sexual Activity  ? Alcohol use: No  ?  Alcohol/week: 0.0 standard drinks  ? Drug use: No  ? Sexual activity: Not on file  ?Other Topics Concern  ? Not on file  ?Social History Narrative  ? Works full time.   ? ?Social Determinants of Health  ? ?Financial Resource Strain: Not on file  ?Food Insecurity: Not on file  ?Transportation Needs: Not on file  ?Physical Activity: Not on file  ?Stress: Not on file  ?Social Connections: Not on file  ?  ? ?Family History:  The patient's family history includes Colon cancer in his brother; Coronary artery disease in an other family member; Stroke in an other family member.  ? ?Review of Systems:   ? ?Please see the history of present illness.    ? ?All other systems reviewed and are otherwise negative except as noted above. ? ? ?Physical Exam:   ? ?VS:  BP (!) 166/84   Pulse 87   Ht '5\' 10"'$  (1.778 m)   Wt 185 lb (83.9 kg)   SpO2 94%   BMI 26.54 kg/m?    ?General: Well developed, well  nourished,male appearing in no acute distress. ?Head: Normocephalic, atraumatic. ?Neck: Right carotid bruit. JVD not elevated.  ?Lungs: Respirations regular and unlabored, without wheezes or rales.  ?Heart: Irregu

## 2022-02-26 ENCOUNTER — Ambulatory Visit: Payer: Medicare Other | Admitting: Student

## 2022-02-26 ENCOUNTER — Other Ambulatory Visit: Payer: Self-pay

## 2022-02-26 ENCOUNTER — Encounter: Payer: Self-pay | Admitting: Student

## 2022-02-26 VITALS — BP 166/84 | HR 87 | Ht 70.0 in | Wt 185.0 lb

## 2022-02-26 DIAGNOSIS — I10 Essential (primary) hypertension: Secondary | ICD-10-CM | POA: Diagnosis not present

## 2022-02-26 DIAGNOSIS — I484 Atypical atrial flutter: Secondary | ICD-10-CM

## 2022-02-26 DIAGNOSIS — N1832 Chronic kidney disease, stage 3b: Secondary | ICD-10-CM | POA: Diagnosis not present

## 2022-02-26 DIAGNOSIS — I25118 Atherosclerotic heart disease of native coronary artery with other forms of angina pectoris: Secondary | ICD-10-CM

## 2022-02-26 DIAGNOSIS — I6523 Occlusion and stenosis of bilateral carotid arteries: Secondary | ICD-10-CM | POA: Diagnosis not present

## 2022-02-26 DIAGNOSIS — E785 Hyperlipidemia, unspecified: Secondary | ICD-10-CM | POA: Diagnosis not present

## 2022-02-26 MED ORDER — METOPROLOL SUCCINATE ER 25 MG PO TB24: 25.0000 mg | ORAL_TABLET | Freq: Every day | ORAL | 3 refills | Status: DC

## 2022-02-26 NOTE — Patient Instructions (Signed)
Medication Instructions:  ? ?Restart Toprol XL 25 mg Daily  ? ?*If you need a refill on your cardiac medications before your next appointment, please call your pharmacy* ? ? ?Lab Work: ?NONE  ? ?If you have labs (blood work) drawn today and your tests are completely normal, you will receive your results only by: ?MyChart Message (if you have MyChart) OR ?A paper copy in the mail ?If you have any lab test that is abnormal or we need to change your treatment, we will call you to review the results. ? ? ?Testing/Procedures: ?NONE  ? ? ?Follow-Up: ?At [facility], you and your health needs are our priority.  As part of our continuing mission to provide you with exceptional heart care, we have created designated Provider Care Teams.  These Care Teams include your primary Cardiologist (physician) and Advanced Practice Providers (APPs -  Physician Assistants and Nurse Practitioners) who all work together to provide you with the care you need, when you need it. ? ?We recommend signing up for the patient portal called "MyChart".  Sign up information is provided on this After Visit Summary.  MyChart is used to connect with patients for Virtual Visits (Telemedicine).  Patients are able to view lab/test results, encounter notes, upcoming appointments, etc.  Non-urgent messages can be sent to your provider as well.   ?To learn more about what you can do with MyChart, go to [email or web address].   ? ?Your next appointment:   ?6 month(s) ? ?The format for your next appointment:   ?In Person ? ?Provider:   ?[name], MD  ? ? ?Other Instructions ?Monitor Blood Pressure for 2-3 weeks and drop off sheets when complete.  ? ?Thank you for choosing [facility]! ?  ? ?

## 2022-02-27 ENCOUNTER — Encounter: Payer: Self-pay | Admitting: Student

## 2022-03-10 DIAGNOSIS — M25512 Pain in left shoulder: Secondary | ICD-10-CM | POA: Diagnosis not present

## 2022-03-11 ENCOUNTER — Other Ambulatory Visit: Payer: Self-pay | Admitting: Cardiology

## 2022-03-12 ENCOUNTER — Ambulatory Visit: Payer: Medicare Other | Admitting: Orthopedic Surgery

## 2022-03-12 DIAGNOSIS — K219 Gastro-esophageal reflux disease without esophagitis: Secondary | ICD-10-CM | POA: Diagnosis not present

## 2022-03-12 DIAGNOSIS — I10 Essential (primary) hypertension: Secondary | ICD-10-CM | POA: Diagnosis not present

## 2022-03-19 ENCOUNTER — Other Ambulatory Visit: Payer: Self-pay

## 2022-03-19 MED ORDER — ATORVASTATIN CALCIUM 20 MG PO TABS: 20.0000 mg | ORAL_TABLET | Freq: Every day | ORAL | 3 refills | Status: DC

## 2022-03-25 DIAGNOSIS — R7301 Impaired fasting glucose: Secondary | ICD-10-CM | POA: Diagnosis not present

## 2022-03-25 DIAGNOSIS — I10 Essential (primary) hypertension: Secondary | ICD-10-CM | POA: Diagnosis not present

## 2022-03-26 DIAGNOSIS — I482 Chronic atrial fibrillation, unspecified: Secondary | ICD-10-CM | POA: Insufficient documentation

## 2022-03-27 DIAGNOSIS — N1832 Chronic kidney disease, stage 3b: Secondary | ICD-10-CM | POA: Diagnosis not present

## 2022-03-27 DIAGNOSIS — R7301 Impaired fasting glucose: Secondary | ICD-10-CM | POA: Diagnosis not present

## 2022-03-27 DIAGNOSIS — I482 Chronic atrial fibrillation, unspecified: Secondary | ICD-10-CM | POA: Diagnosis not present

## 2022-03-27 DIAGNOSIS — D696 Thrombocytopenia, unspecified: Secondary | ICD-10-CM | POA: Diagnosis not present

## 2022-03-27 DIAGNOSIS — I10 Essential (primary) hypertension: Secondary | ICD-10-CM | POA: Diagnosis not present

## 2022-03-27 DIAGNOSIS — M545 Low back pain, unspecified: Secondary | ICD-10-CM | POA: Diagnosis not present

## 2022-03-27 DIAGNOSIS — E782 Mixed hyperlipidemia: Secondary | ICD-10-CM | POA: Diagnosis not present

## 2022-03-27 DIAGNOSIS — M7989 Other specified soft tissue disorders: Secondary | ICD-10-CM | POA: Diagnosis not present

## 2022-03-30 ENCOUNTER — Encounter: Payer: Self-pay | Admitting: Vascular Surgery

## 2022-03-30 ENCOUNTER — Ambulatory Visit (HOSPITAL_COMMUNITY)
Admission: RE | Admit: 2022-03-30 | Discharge: 2022-03-30 | Disposition: A | Discharge status: Home / Self Care | Payer: Medicare Other | Source: Ambulatory Visit | Attending: Vascular Surgery | Admitting: Vascular Surgery

## 2022-03-30 ENCOUNTER — Ambulatory Visit: Payer: Medicare Other | Admitting: Vascular Surgery

## 2022-03-30 VITALS — BP 163/89 | HR 83 | Temp 98.1°F | Resp 18 | Ht 70.0 in | Wt 186.0 lb

## 2022-03-30 DIAGNOSIS — I6522 Occlusion and stenosis of left carotid artery: Secondary | ICD-10-CM

## 2022-03-30 NOTE — Progress Notes (Signed)
? ?Patient name: Derek [name] MRN: [id number] DOB: [birth year] Sex: male ? ?REASON FOR VISIT: Follow-up for carotid artery surveillance ? ?HPI: ?Derek [name] is a 76 y.o. male who presents for follow-up and surveillance of his carotid artery disease.  He has a known right carotid artery occlusion.  He underwent a left carotid endarterectomy on 04/07/2020 for an asymptomatic high-grade stenosis by myself.  We have been following a 60 to 79% stenosis on the left after carotid endarterectomy.  He denies any associated neurologic events since last follow-up.  No new concerns today.  Did have spine surgery recently on his lumbar spine at L4-L5 for left leg radiculopathy pain with improvement. ? ?Past Medical History:  ?Diagnosis Date  ? Anxiety   ? Arthritis   ? Atypical atrial flutter ([place])   ? CAD (coronary artery disease)   ? a. s/p DES to RCA in 2007 b. s/p NST in 10/2015 which was intermediate risk and pt preferred medical management at that time  ? Carotid artery disease ([place])   ? Cataract   ? Chronic idiopathic thrombocytopenia (HCC)   ? Chronic renal insufficiency   ? Single kidney  ? Complication of anesthesia   ? Coronary atherosclerosis of native coronary vessel   ? DES RCA 2007  ? Depression   ? Essential hypertension   ? GERD (gastroesophageal reflux disease)   ? Hx of colonic polyps 01/01/2015  ? Mixed hyperlipidemia   ? Myocardial infarction [facility]) 2006-07-05  ? STEMI   ? Peripheral vascular disease ([place])   ? PONV (postoperative nausea and vomiting)   ? ? ?Past Surgical History:  ?Procedure Laterality Date  ? CARDIAC CATHETERIZATION  2010  ? Patent stent and nonobsturctive cad following an abnormal cardiolite study may of 2010 with an apical lateral defect  ? CARPAL TUNNEL RELEASE  11/28/2012  ? Procedure: CARPAL TUNNEL RELEASE;  Surgeon: Jessy Oto, MD;  Location: [place];  Service: Orthopedics;  Laterality: Left;  Left anterior submuscular transposition of ulnar nerve at elbow, left open carpal tunnel  release  ? COLONOSCOPY    ? ELBOW SURGERY Left 2013  ? ENDARTERECTOMY Left 04/07/2020  ? Procedure: LEFT CAROTID ENDARTERECTOMY;  Surgeon: Marty Heck, MD;  Location: [place];  Service: Vascular;  Laterality: Left;  ? EYE SURGERY    ? [place]  ? screws 1962  ? FRACTURE SURGERY    ? HARDWARE REMOVAL  03/20/2012  ? Procedure: HARDWARE REMOVAL;  Surgeon: Jessy Oto, MD;  Location: [place];  Service: Orthopedics;  Laterality: Left;  Hardware removal 3 knowles pins left hip  ? HERNIA REPAIR    ? JOINT REPLACEMENT    ? LUMBAR LAMINECTOMY/DECOMPRESSION MICRODISCECTOMY N/A 10/08/2021  ? Procedure: Lumbar Two-Three Laminectomy/Foraminotomy, Posterolateral Arthrodesis, Non-Segmental Instrumentation Screw Fixation;  Surgeon: Ashok Pall, MD;  Location: [place];  Service: Neurosurgery;  Laterality: N/A;  ? PATCH ANGIOPLASTY Left 04/07/2020  ? Procedure: Patch Angioplasty Left Carotid;  Surgeon: Marty Heck, MD;  Location: [place];  Service: Vascular;  Laterality: Left;  ? TONSILLECTOMY    ? TOTAL HIP ARTHROPLASTY  03/20/2012  ? Procedure: TOTAL HIP ARTHROPLASTY;  Surgeon: Jessy Oto, MD;  Location: [place];  Service: Orthopedics;  Laterality: Left;  Left total hip replacement, ceramic on poly  ? ? ?Family History  ?Problem Relation Age of Onset  ? Stroke Other   ? Coronary artery disease Other   ? Colon cancer Brother   ?  has colostomy   ? Esophageal cancer Neg Hx   ? Stomach cancer Neg Hx   ? Rectal cancer Neg Hx   ? Colon polyps Neg Hx   ? ? ?SOCIAL HISTORY: ?Social History  ? ?Tobacco Use  ? Smoking status: Former  ?  Packs/day: 3.00  ?  Years: 42.00  ?  Pack years: 126.00  ?  Types: Cigarettes  ?  Start date: 10/30/1960  ?  Quit date: 07/23/2004  ?  Years since quitting: 17.6  ? Smokeless tobacco: Never  ? Tobacco comments:  ?  Counseled to remain smoke free  ?Substance Use Topics  ? Alcohol use: No  ?  Alcohol/week: 0.0 standard drinks  ? ? ?Allergies  ?Allergen Reactions  ? Ibuprofen  Other (See Comments)  ?  One kidney ?Pt has one Kidney   ? Nsaids   ? Prednisone Swelling  ?  Causes swelling in legs and feet   ? ? ?Current Outpatient Medications  ?Medication Sig Dispense Refill  ? acetaminophen (TYLENOL) 500 MG tablet Take 500-1,000 mg by mouth every 6 (six) hours as needed for moderate pain.    ? allopurinol (ZYLOPRIM) 100 MG tablet Take 200 mg by mouth daily.    ? aspirin EC 81 MG tablet Take 81 mg by mouth daily. Swallow whole.    ? atorvastatin (LIPITOR) 20 MG tablet Take 1 tablet (20 mg total) by mouth daily. 90 tablet 3  ? diazepam (VALIUM) 2 MG tablet Take 2 mg by mouth 2 (two) times daily as needed for anxiety.    ? ferrous sulfate 325 (65 FE) MG tablet Take 325 mg by mouth daily with breakfast.    ? furosemide (LASIX) 40 MG tablet Take 20 mg by mouth daily.    ? hydrALAZINE (APRESOLINE) 25 MG tablet TAKE 1 TABLET BY MOUTH THREE TIMES DAILY 180 tablet 2  ? metoprolol succinate (TOPROL XL) 25 MG 24 hr tablet Take 1 tablet (25 mg total) by mouth daily. 90 tablet 3  ? Multiple Vitamins-Minerals (PRESERVISION AREDS PO) Take 1 tablet by mouth in the morning and at bedtime.    ? nitroGLYCERIN (NITROSTAT) 0.4 MG SL tablet Place 1 tablet (0.4 mg total) under the tongue every 5 (five) minutes x 3 doses as needed (if no relief after 3rd dose, proceed to the ED for an evaluation). For chest pain. If no relief after 3 rd dose, proceed to the ED for an evaluation. 25 tablet 3  ? Omega-3 Fatty Acids (FISH OIL MAXIMUM STRENGTH) 1200 MG CAPS Take 1,200 mg by mouth in the morning and at bedtime.    ? omeprazole (PRILOSEC) 20 MG capsule Take 20 mg by mouth daily.      ? Potassium 99 MG TABS Take 99 mg by mouth daily as needed (cramping in legs).    ? tiZANidine (ZANAFLEX) 4 MG tablet Take 1 tablet (4 mg total) by mouth every 6 (six) hours as needed for muscle spasms. 60 tablet 0  ? vitamin B-12 (CYANOCOBALAMIN) 1000 MCG tablet Take 1,000 mcg by mouth daily.    ? oxyCODONE-acetaminophen (PERCOCET/ROXICET)  5-325 MG tablet Take 0.25 tablets by mouth every 6 (six) hours as needed for severe pain. (Patient not taking: Reported on 03/30/2022)    ? ?No current facility-administered medications for this visit.  ? ? ?REVIEW OF SYSTEMS:  ?'[X]'$  denotes positive finding, '[ ]'$  denotes negative finding ?Cardiac  Comments:  ?Chest pain or chest pressure:    ?Shortness of breath upon exertion:    ?  Short of breath when lying flat:    ?Irregular heart rhythm:    ?    ?Vascular    ?Pain in calf, thigh, or hip brought on by ambulation:    ?Pain in feet at night that wakes you up from your sleep:     ?Blood clot in your veins:    ?Leg swelling:     ?    ?Pulmonary    ?Oxygen at home:    ?Productive cough:     ?Wheezing:     ?    ?Neurologic    ?Sudden weakness in arms or legs:     ?Sudden numbness in arms or legs:     ?Sudden onset of difficulty speaking or slurred speech:    ?Temporary loss of vision in one eye:     ?Problems with dizziness:     ?    ?Gastrointestinal    ?Blood in stool:     ?Vomited blood:     ?    ?Genitourinary    ?Burning when urinating:     ?Blood in urine:    ?    ?Psychiatric    ?Major depression:     ?    ?Hematologic    ?Bleeding problems:    ?Problems with blood clotting too easily:    ?    ?Skin    ?Rashes or ulcers:    ?    ?Constitutional    ?Fever or chills:    ? ? ?PHYSICAL EXAM: ?Vitals:  ? 03/30/22 1340 03/30/22 1342  ?BP: (!) 157/83 (!) 163/89  ?Pulse: 80 83  ?Resp: 18   ?Temp: 98.1 ?F (36.7 ?C)   ?TempSrc: Temporal   ?SpO2: 94%   ?Weight: 186 lb (84.4 kg)   ?Height: '5\' 10"'$  (1.778 m)   ? ? ?GENERAL: The patient is a well-nourished male, in no acute distress. The vital signs are documented above. ?CARDIAC: There is a regular rate and rhythm.  ?VASCULAR:  ?Left neck incision well healed. ?PULMONARY: No respiratory distress. ?ABDOMEN: Soft and non-tender. ?MUSCULOSKELETAL: There are no major deformities or cyanosis. ?NEUROLOGIC: No focal weakness or paresthesias are detected. Cn II-XII appear grossly  intact.   ? ? ?DATA:  ? ?Duplex today again shows right ICA occlusion and a left ICA velocity consistent with 60-79% stenosis with velocity 234/78 and I suspect this is overestimated given contralateral occlusio

## 2022-04-01 DIAGNOSIS — H524 Presbyopia: Secondary | ICD-10-CM | POA: Diagnosis not present

## 2022-04-02 ENCOUNTER — Other Ambulatory Visit: Payer: Self-pay | Admitting: *Deleted

## 2022-04-02 DIAGNOSIS — M79606 Pain in leg, unspecified: Secondary | ICD-10-CM

## 2022-04-02 DIAGNOSIS — I6523 Occlusion and stenosis of bilateral carotid arteries: Secondary | ICD-10-CM

## 2022-04-07 DIAGNOSIS — M25512 Pain in left shoulder: Secondary | ICD-10-CM | POA: Diagnosis not present

## 2022-05-28 ENCOUNTER — Ambulatory Visit: Payer: Medicare Other | Admitting: Specialist

## 2022-05-28 ENCOUNTER — Encounter: Payer: Self-pay | Admitting: Specialist

## 2022-05-28 VITALS — BP 170/92 | HR 76 | Ht 70.0 in | Wt 186.0 lb

## 2022-05-28 DIAGNOSIS — M5416 Radiculopathy, lumbar region: Secondary | ICD-10-CM

## 2022-05-28 DIAGNOSIS — M21372 Foot drop, left foot: Secondary | ICD-10-CM

## 2022-05-28 DIAGNOSIS — Z981 Arthrodesis status: Secondary | ICD-10-CM

## 2022-05-28 DIAGNOSIS — M48062 Spinal stenosis, lumbar region with neurogenic claudication: Secondary | ICD-10-CM | POA: Diagnosis not present

## 2022-05-28 MED ORDER — GABAPENTIN 100 MG PO CAPS: 100.0000 mg | ORAL_CAPSULE | Freq: Every day | ORAL | 0 refills | Status: DC

## 2022-05-28 NOTE — Patient Instructions (Signed)
Plan: Avoid bending, stooping and avoid lifting weights greater than 10 lbs. Avoid prolong standing and walking. Avoid frequent bending and stooping  No lifting greater than 10 lbs. May use ice or moist heat for pain. Weight loss is of benefit. Handicap license is approved. EMG/NCV left leg   Follow-Up Instructions: No follow-ups on file

## 2022-05-28 NOTE — Progress Notes (Signed)
Office Visit Note   Patient: Derek [name]           Date of Birth: 1946/07/31           MRN: [id number] Visit Date: 05/28/2022              Requested by: Celene Squibb, MD [place],  [place] [id number] PCP: Celene Squibb, MD   Assessment & Plan: Visit Diagnoses:  1. Lumbar radiculopathy   2. Foot drop, left   3. Spinal stenosis of lumbar region with neurogenic claudication   4. History of lumbar fusion     Plan: Avoid bending, stooping and avoid lifting weights greater than 10 lbs. Avoid prolong standing and walking. Avoid frequent bending and stooping  No lifting greater than 10 lbs. May use ice or moist heat for pain. Weight loss is of benefit. Handicap license is approved. EMG/NCV left leg  Follow-Up Instructions: No follow-ups on file.   Orders:  Orders Placed This Encounter  Procedures   Ambulatory referral to Physical Medicine Rehab   No orders of the defined types were placed in this encounter.     Procedures: No procedures performed   Clinical Data: Findings:  Narrative & Impression CLINICAL DATA:  Back pain.   EXAM: CT THORACIC AND LUMBAR SPINE WITHOUT CONTRAST   TECHNIQUE: Multidetector CT imaging of the thoracic and lumbar spine was performed without contrast. Multiplanar CT image reconstructions were also generated.   COMPARISON:  Chest CT 01/31/2019   FINDINGS: CT THORACIC SPINE FINDINGS   Alignment: Normal alignment of the thoracic vertebral bodies.   Vertebrae: Intact.  Mild osteopenia.  No fractures or bone lesions.   Paraspinal and other soft tissues: Advanced atherosclerotic calcifications involving the thoracic aorta and coronary arteries. The heart is enlarged. Underlying emphysematous changes are again noted. Small pulmonary nodules appears stable.   There is a long band airspace opacification in the right paraspinal region. This could be subpleural atelectasis. I do not see on the prior CT scan from  2020.   Disc levels: Moderate degenerative changes in the mid to lower thoracic spine with mild joint space narrowing and right-sided bridging osteophytes. No acute bony findings or destructive bony changes. No spinal canal compromise. No large disc protrusions are identified.   CT LUMBAR SPINE FINDINGS   Segmentation: There are five lumbar type vertebral bodies. The last full intervertebral disc space is labeled L5-S1.   Alignment: Normal   Vertebrae: Osteopenia but no bone lesions or fractures.   Paraspinal and other soft tissues: Advanced atherosclerotic calcifications involving the abdominal aorta but no aneurysm. Small right kidney noted with a simple appearing cyst. No retroperitoneal mass or adenopathy.   Disc levels:   L1-2: No significant findings.   L2-3: Significant/severe multifactorial spinal stenosis. There is a broad-based degenerated bulging annulus, short pedicles, facet disease and ligamentum flavum thickening.   Moderately severe multifactorial spinal and bilateral lateral recess stenosis at L3-4. There is also left foraminal stenosis. Bulging degenerated and partially calcified disc along with advanced facet disease and ligamentum flavum thickening.   L4-5: Bulging degenerated annulus, rim calcified central disc protrusion, advanced facet disease and ligamentum flavum thickening contributing to severe spinal and bilateral lateral recess stenosis and mild bilateral foraminal stenosis.   L5-S1: Facet disease but no disc protrusions, spinal or foraminal stenosis.   IMPRESSION: 1. Normal alignment of the thoracic and lumbar vertebral bodies and no acute bony findings or destructive bony changes. 2. Degenerative thoracic  and lumbar spondylosis with multilevel disc disease and advanced facet disease. 3. Significant/severe multifactorial spinal stenosis at L2-3 and L3-4. 4. Severe spinal and bilateral lateral recess stenosis and mild bilateral foraminal  stenosis at L4-5. 5. Right paraspinal airspace opacity in the lung likely subpleural atelectasis. There are also bilateral pulmonary nodules. Recommend follow-up noncontrast chest CT in 3-4 months to reassess. 6. Stable advanced atherosclerotic calcifications involving the aorta and coronary arteries. 7. Emphysema and pulmonary scarring.   Aortic Atherosclerosis (ICD10-I70.0) and Emphysema (ICD10-J43.9).     Electronically Signed   By: Marijo Sanes M.D.   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 Dr. Carlis Abbott is looking at his legs as they are swelling. He had lumbar surgery by Dr. Christella Noa in 09/2021 lumbar decompression L3-4 and L4-5 with fusion with pedicle screws and rods and post op he has noticed ongoing pain into the left leg with sciatica symptoms When sitting. The pain and numbness and tingling worsens into the left leg posteriorly and laterally into the left lateral calf and lateral and plantar left foot. It is better with standing and walking and worsens with sitting.    Review of Systems  Constitutional: Negative.   HENT: Negative.    Eyes: Negative.   Respiratory: Negative.    Cardiovascular: Negative.   Gastrointestinal: Negative.   Endocrine: Negative.   Genitourinary: Negative.   Musculoskeletal: Negative.   Skin: Negative.   Allergic/Immunologic: Negative.   Neurological: Negative.   Hematological: Negative.   Psychiatric/Behavioral: Negative.       Objective: Vital Signs: BP (!) 170/92   Pulse 76   Ht '5\' 10"'$  (1.778 m)   Wt 186 lb (84.4 kg)   BMI 26.69 kg/m   Physical Exam Constitutional:      Appearance: He is well-developed.  HENT:     Head: Normocephalic and atraumatic.  Eyes:     Pupils: Pupils are equal, round, and reactive to light.  Pulmonary:     Effort: Pulmonary  effort is normal.     Breath sounds: Normal breath sounds.  Abdominal:     General: Bowel sounds are normal.     Palpations: Abdomen is soft.  Musculoskeletal:        General: Normal range of motion.     Cervical back: Normal range of motion and neck supple.  Skin:    General: Skin is warm and dry.  Neurological:     Mental Status: He is alert and oriented to person, place, and time.  Psychiatric:        Behavior: Behavior normal.        Thought Content: Thought content normal.        Judgment: Judgment normal.     Ortho Exam  Specialty Comments:  No specialty comments available.  Imaging: No results found.   PMFS History: Patient Active Problem List   Diagnosis Date Noted   Cubital tunnel syndrome on left 11/28/2012    Priority: High    Class: Chronic   Carpal tunnel syndrome on left 11/28/2012    Priority: Medium     Class: Chronic   Lumbar stenosis with neurogenic claudication 10/08/2021   Leg swelling 09/08/2021   Carotid stenosis, asymptomatic, left 04/07/2020   PVD (peripheral vascular disease) ([place]) 09/05/2018   Obstruction of right carotid  artery without cerebral infarction 05/27/2017   Carotid disease, bilateral ([place]) 05/27/2017   Hx of colonic polyps 01/01/2015   Cervical pain 06/06/2014   Stiffness in joint 06/06/2014   CKD (chronic kidney disease) stage 3, GFR 30-59 ml/min (H[facility]) 09/20/2012   Essential hypertension, benign 09/20/2012   Mixed hyperlipidemia 09/03/2009   CAD, NATIVE VESSEL 09/03/2009   Past Medical History:  Diagnosis Date   Anxiety    Arthritis    Atypical atrial flutter ([facility])    CAD (coronary artery disease)    a. s/p DES to RCA in 2007 b. s/p NST in 10/2015 which was intermediate risk and pt preferred medical management at that time   Carotid artery disease ([place])    Cataract    Chronic idiopathic thrombocytopenia ([place])    Chronic renal insufficiency    Single kidney   Complication of anesthesia    Coronary atherosclerosis of  native coronary vessel    DES RCA 2007   Depression    Essential hypertension    GERD (gastroesophageal reflux disease)    Hx of colonic polyps 01/01/2015   Mixed hyperlipidemia    Myocardial infarction ([place]) 2006-07-05   STEMI    Peripheral vascular disease ([facility])    PONV (postoperative nausea and vomiting)     Family History  Problem Relation Age of Onset   Stroke Other    Coronary artery disease Other    Colon cancer Brother        has colostomy    Esophageal cancer Neg Hx    Stomach cancer Neg Hx    Rectal cancer Neg Hx    Colon polyps Neg Hx     Past Surgical History:  Procedure Laterality Date   CARDIAC CATHETERIZATION  2010   Patent stent and nonobsturctive cad following an abnormal cardiolite study may of 2010 with an apical lateral defect   CARPAL TUNNEL RELEASE  11/28/2012   Procedure: CARPAL TUNNEL RELEASE;  Surgeon: Jessy Oto, MD;  Location: [place];  Service: Orthopedics;  Laterality: Left;  Left anterior submuscular transposition of ulnar nerve at elbow, left open carpal tunnel release   COLONOSCOPY     ELBOW SURGERY Left 2013   ENDARTERECTOMY Left 04/07/2020   Procedure: LEFT CAROTID ENDARTERECTOMY;  Surgeon: Marty Heck, MD;  Location: [place];  Service: Vascular;  Laterality: Left;   [place] REMOVAL  03/20/2012   Procedure: HARDWARE REMOVAL;  Surgeon: Jessy Oto, MD;  Location: [place];  Service: Orthopedics;  Laterality: Left;  Hardware removal 3 knowles pins left hip   HERNIA REPAIR     JOINT REPLACEMENT     LUMBAR LAMINECTOMY/DECOMPRESSION MICRODISCECTOMY N/A 10/08/2021   Procedure: Lumbar Two-Three Laminectomy/Foraminotomy, Posterolateral Arthrodesis, Non-Segmental Instrumentation Screw Fixation;  Surgeon: Ashok Pall, MD;  Location: [place];  Service: Neurosurgery;  Laterality: N/A;   PATCH ANGIOPLASTY Left 04/07/2020   Procedure: Patch Angioplasty Left Carotid;   Surgeon: Marty Heck, MD;  Location: [place];  Service: Vascular;  Laterality: Left;   TONSILLECTOMY     TOTAL HIP ARTHROPLASTY  03/20/2012   Procedure: TOTAL HIP ARTHROPLASTY;  Surgeon: Jessy Oto, MD;  Location: [place];  Service: Orthopedics;  Laterality: Left;  Left total hip replacement, ceramic on poly   Social History   Occupational History   Not on file  Tobacco Use   Smoking status: Former  Packs/day: 3.00    Years: 42.00    Total pack years: 126.00    Types: Cigarettes    Start date: 10/30/1960    Quit date: 07/23/2004    Years since quitting: 17.8   Smokeless tobacco: Never   Tobacco comments:    Counseled to remain smoke free  Vaping Use   Vaping Use: Never used  Substance and Sexual Activity   Alcohol use: No    Alcohol/week: 0.0 standard drinks of alcohol   Drug use: No   Sexual activity: Not on file

## 2022-05-28 NOTE — Addendum Note (Signed)
Addended by: Basil Dess on: 05/28/2022 02:43 PM   Modules accepted: Orders

## 2022-06-23 ENCOUNTER — Other Ambulatory Visit: Payer: Self-pay | Admitting: Specialist

## 2022-06-28 DIAGNOSIS — M48062 Spinal stenosis, lumbar region with neurogenic claudication: Secondary | ICD-10-CM | POA: Diagnosis not present

## 2022-07-01 ENCOUNTER — Encounter: Payer: Self-pay | Admitting: Physical Medicine and Rehabilitation

## 2022-07-01 ENCOUNTER — Ambulatory Visit: Payer: Medicare Other | Admitting: Physical Medicine and Rehabilitation

## 2022-07-01 DIAGNOSIS — R202 Paresthesia of skin: Secondary | ICD-10-CM

## 2022-07-01 NOTE — Progress Notes (Signed)
Pt state left leg pain and weakness. Pt state walking for a long period of time makes the pain worse. Pt state he takes pain meds to help ease his pain.  Numeric Pain Rating Scale and Functional Assessment Average Pain 6   In the last MONTH (on 0-10 scale) has pain interfered with the following?  1. General activity like being  able to carry out your everyday physical activities such as walking, climbing stairs, carrying groceries, or moving a chair?  Rating(9)    -BT,

## 2022-07-05 ENCOUNTER — Encounter: Payer: Self-pay | Admitting: Specialist

## 2022-07-05 ENCOUNTER — Ambulatory Visit: Payer: Medicare Other | Admitting: Specialist

## 2022-07-05 VITALS — BP 136/74 | HR 77 | Ht 70.0 in | Wt 186.0 lb

## 2022-07-05 DIAGNOSIS — M5416 Radiculopathy, lumbar region: Secondary | ICD-10-CM

## 2022-07-05 DIAGNOSIS — Z981 Arthrodesis status: Secondary | ICD-10-CM

## 2022-07-05 DIAGNOSIS — R202 Paresthesia of skin: Secondary | ICD-10-CM | POA: Diagnosis not present

## 2022-07-05 DIAGNOSIS — M21372 Foot drop, left foot: Secondary | ICD-10-CM | POA: Diagnosis not present

## 2022-07-05 DIAGNOSIS — M48062 Spinal stenosis, lumbar region with neurogenic claudication: Secondary | ICD-10-CM

## 2022-07-05 NOTE — Progress Notes (Signed)
Office Visit Note   Patient: Derek [name]           Date of Birth: [birth year]           MRN: [id number] Visit Date: 07/05/2022              Requested by: Celene Squibb, MD [place],  [place] [address] PCP: Celene Squibb, MD   Assessment & Plan: Visit Diagnoses:  1. Foot drop, left   2. Spinal stenosis of lumbar region with neurogenic claudication   3. History of lumbar fusion   4. Lumbar radiculopathy   5. Paresthesia of skin     Plan: Avoid bending, stooping and avoid lifting weights greater than 10 lbs. Avoid prolong standing and walking. Order for a new walker with wheels. Surgery scheduling secretary Kandice Hams, will call you in the next week to schedule for surgery.  Surgery recommended is a three level lumbar fdecompressive central laminectomies L3-4, L4-5 and L5-S1 this would be done with OR microscope Take hydrocodone for for pain. Risk of surgery includes risk of infection 1 in 200 patients, bleeding 1/2% chance you would need a transfusion.   Risk to the nerves is one in 10,000.  Expect improved walking and standing tolerance. Expect relief of leg pain but numbness may persist depending on the length and degree of pressure that has been present.    Follow-Up Instructions: No follow-ups on file.   Orders:  No orders of the defined types were placed in this encounter.  No orders of the defined types were placed in this encounter.     Procedures: No procedures performed   Clinical Data: No additional findings.   Subjective: Chief Complaint  Patient presents with   Left Leg - Follow-up    EMG/NCS Review   Lower Back - Follow-up    76 year old male right handed with history of left foot weakness in dorsiflexion and previous right leg pain. The right leg is better but he is still having numbness and paresthesias. No bowel or bladder difficulty. He has difficulty standing and walking. Can make it 150 ' to the mailbox and back and he  is unable to walk much further. 6-8 weeks ago was able to walk further. When he is in a recliner he is painful. Uses a foot on pillow since hip surgery   Review of Systems  Constitutional: Negative.   HENT: Negative.    Eyes: Negative.   Respiratory: Negative.    Cardiovascular: Negative.   Gastrointestinal: Negative.   Endocrine: Negative.   Genitourinary: Negative.   Musculoskeletal: Negative.   Skin: Negative.   Allergic/Immunologic: Negative.   Neurological: Negative.   Hematological: Negative.   Psychiatric/Behavioral: Negative.       Objective: Vital Signs: BP 136/74 (BP Location: Left Arm, Patient Position: Sitting)   Pulse 77   Ht '5\' 10"'$  (1.778 m)   Wt 186 lb (84.4 kg)   BMI 26.69 kg/m   Physical Exam Constitutional:      Appearance: He is well-developed.  HENT:     Head: Normocephalic and atraumatic.  Eyes:     Pupils: Pupils are equal, round, and reactive to light.  Pulmonary:     Effort: Pulmonary effort is normal.     Breath sounds: Normal breath sounds.  Abdominal:     General: Bowel sounds are normal.     Palpations: Abdomen is soft.  Musculoskeletal:     Cervical back: Normal range of  motion and neck supple.     Lumbar back: Negative right straight leg raise test and negative left straight leg raise test.  Skin:    General: Skin is warm and dry.  Neurological:     Mental Status: He is alert and oriented to person, place, and time.  Psychiatric:        Behavior: Behavior normal.        Thought Content: Thought content normal.        Judgment: Judgment normal.    Back Exam   Tenderness  The patient is experiencing tenderness in the lumbar.  Range of Motion  Extension:  abnormal  Flexion:  normal  Lateral bend right:  abnormal  Lateral bend left:  abnormal  Rotation right:  abnormal  Rotation left:  abnormal   Tests  Straight leg raise right: negative Straight leg raise left: negative  Other  Toe walk: abnormal Heel walk:  abnormal Gait: abnormal      Specialty Comments:  MRI LUMBAR SPINE WITHOUT AND WITH CONTRAST  TECHNIQUE: Multiplanar and multiecho pulse sequences of the lumbar spine were obtained without and with intravenous contrast.  CONTRAST: 17 mL MultiHance IV  COMPARISON: Lumbar MRI 09/15/2021  FINDINGS: Segmentation: 5 lumbar vertebra  Alignment: Mild retrolisthesis L4-5 otherwise normal alignment.  Vertebrae: Negative for fracture, mass, or osteomyelitis.  Conus medullaris and cauda equina: Conus extends to the T12-L1 level. Conus and cauda equina appear normal.  Paraspinal and other soft tissues: Negative for paraspinous mass, adenopathy, or fluid collection. Marked atrophy right kidney  Disc levels:  L1-2: Shallow right paracentral disc protrusion unchanged. Negative for stenosis  L2-3: Interval laminectomy and bilateral pedicle screw fusion. No residual spinal stenosis. Diffuse disc bulging and bilateral facet degeneration. Mild to moderate subarticular stenosis bilaterally.  L3-4: Diffuse disc bulging and endplate spurring. Moderate facet degeneration bilaterally. Mild spinal stenosis unchanged. Moderate subarticular and foraminal stenosis bilaterally also unchanged.  L4-5: Central and right-sided disc protrusion. Disc protrusion extends into the right foramen. There is an extruded disc fragment extending caudally behind the L4 vertebral body on the right which has improved in the interval. There remains severe subarticular and foraminal stenosis on the right.. Mild spinal stenosis. Moderate left subarticular stenosis.  L5-S1: Small central disc protrusion. Mild facet hypertrophy. Mild foraminal narrowing bilaterally.  IMPRESSION: Interval laminectomy and pedicle screw fusion L2-3. There remains mild to moderate subarticular stenosis bilaterally.  Mild spinal stenosis L3-4. Moderate subarticular and foraminal stenosis bilaterally unchanged  Central and  right-sided disc protrusion L4-5 causing severe subarticular and foraminal stenosis on the right. The extruded fragment on the right has mildly improved from the prior MRI.   Electronically Signed By: Franchot Gallo M.D. On: 01/06/2022 12:49  Imaging: No results found.   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 s/p DES to RCA in 2007 b. s/p NST in 10/2015 which was intermediate risk and pt preferred medical management at that time   Carotid artery disease ([facility])    Cataract    Chronic idiopathic thrombocytopenia (HCC)    Chronic renal insufficiency    Single kidney   Complication of anesthesia    Coronary atherosclerosis of native coronary vessel    DES RCA 2007   Depression    Essential hypertension    GERD (gastroesophageal reflux disease)    Hx of colonic polyps 01/01/2015   Mixed hyperlipidemia    Myocardial infarction ([facility]) 2006-07-05   STEMI    Peripheral vascular disease (HCC)    PONV (postoperative nausea and vomiting)     Family History  Problem Relation  Age of Onset   Stroke Other    Coronary artery disease Other    Colon cancer Brother        has colostomy    Esophageal cancer Neg Hx    Stomach cancer Neg Hx    Rectal cancer Neg Hx    Colon polyps Neg Hx     Past Surgical History:  Procedure Laterality Date   CARDIAC CATHETERIZATION  2010   Patent stent and nonobsturctive cad following an abnormal cardiolite study may of 2010 with an apical lateral defect   CARPAL TUNNEL RELEASE  11/28/2012   Procedure: CARPAL TUNNEL RELEASE;  Surgeon: Jessy Oto, MD;  Location: [facility];  Service: Orthopedics;  Laterality: Left;  Left anterior submuscular transposition of ulnar nerve at elbow, left open carpal tunnel release   COLONOSCOPY     ELBOW SURGERY Left 2013   ENDARTERECTOMY Left 04/07/2020   Procedure: LEFT CAROTID ENDARTERECTOMY;  Surgeon: Marty Heck, MD;  Location: [facility];  Service: Vascular;  Laterality: Left;   [place] REMOVAL  03/20/2012   Procedure: HARDWARE REMOVAL;  Surgeon: Jessy Oto, MD;  Location: [facility];  Service: Orthopedics;  Laterality: Left;  Hardware removal 3 knowles pins left hip   HERNIA REPAIR     JOINT REPLACEMENT     LUMBAR LAMINECTOMY/DECOMPRESSION MICRODISCECTOMY N/A 10/08/2021   Procedure: Lumbar Two-Three Laminectomy/Foraminotomy, Posterolateral Arthrodesis, Non-Segmental Instrumentation Screw Fixation;  Surgeon: Ashok Pall, MD;  Location: [facility];  Service: Neurosurgery;  Laterality: N/A;   PATCH ANGIOPLASTY Left 04/07/2020   Procedure: Patch Angioplasty Left Carotid;  Surgeon: Marty Heck, MD;  Location: [facility];  Service: Vascular;  Laterality: Left;   TONSILLECTOMY     TOTAL HIP ARTHROPLASTY  03/20/2012   Procedure: TOTAL HIP ARTHROPLASTY;  Surgeon: Jessy Oto, MD;  Location: [facility];  Service: Orthopedics;  Laterality: Left;  Left total hip replacement, ceramic on poly   Social History   Occupational  History   Not on file  Tobacco Use   Smoking status: Former    Packs/day: 3.00    Years: 42.00    Total pack years: 126.00    Types: Cigarettes    Start date: 10/30/1960    Quit date: 07/23/2004    Years since quitting: 17.9   Smokeless tobacco: Never  Tobacco comments:    Counseled to remain smoke free  Vaping Use   Vaping Use: Never used  Substance and Sexual Activity   Alcohol use: No    Alcohol/week: 0.0 standard drinks of alcohol   Drug use: No   Sexual activity: Not on file

## 2022-07-05 NOTE — Progress Notes (Signed)
Office Visit Note   Patient: Derek [name]           Date of Birth: 04-Dec-1946           MRN: [id number] Visit Date: 07/05/2022              Requested by: Celene Squibb, MD [place],  [place] [address] PCP: Celene Squibb, MD   Assessment & Plan: Visit Diagnoses:  1. Foot drop, left   2. Spinal stenosis of lumbar region with neurogenic claudication   3. History of lumbar fusion   4. Lumbar radiculopathy   5. Paresthesia of skin     Plan: Avoid bending, stooping and avoid lifting weights greater than 10 lbs. Avoid prolong standing and walking. Order for a new walker with wheels. Surgery scheduling secretary Kandice Hams, will call you in the next week to schedule for surgery.  Surgery recommended is a three level lumbar fusion L3-4, L4-5 andL5-S1 this would be done with OR microscope Take hydrocodone for for pain. Risk of surgery includes risk of infection 1 in 200 patients, bleeding 1/2% chance you would need a transfusion.   Risk to the nerves is one in 10,000.  Expect improved walking and standing tolerance. Expect relief of leg pain but numbness may persist depending on the length and degree of pressure that has been present.  Follow-Up Instructions: Return in about 4 weeks (around 08/02/2022).   Orders:  No orders of the defined types were placed in this encounter.  No orders of the defined types were placed in this encounter.     Procedures: No procedures performed   Clinical Data: No additional findings.   Subjective: Chief Complaint  Patient presents with   Left Leg - Follow-up    EMG/NCS Review   Lower Back - Follow-up    76 year old male right handed with history of left foot weakness in dorsiflexion and previous right leg pain. The right leg is better but he is still having numbness and paresthesias. No bowel or bladder difficulty. He has difficulty standing and walking. Can make it 150 ' to the mailbox and back and he is unable to  walk much further. 6-8 weeks ago was able to walk further. When he is in a recliner he is painful. Uses a foot on pillow since hip surgery   Review of Systems  Constitutional: Negative.   HENT: Negative.    Eyes: Negative.   Respiratory: Negative.    Cardiovascular: Negative.   Gastrointestinal: Negative.   Endocrine: Negative.   Genitourinary: Negative.   Musculoskeletal: Negative.   Skin: Negative.   Allergic/Immunologic: Negative.   Neurological: Negative.   Hematological: Negative.   Psychiatric/Behavioral: Negative.       Objective: Vital Signs: BP 136/74 (BP Location: Left Arm, Patient Position: Sitting)   Pulse 77   Ht '5\' 10"'$  (1.778 m)   Wt 186 lb (84.4 kg)   BMI 26.69 kg/m   Physical Exam Constitutional:      Appearance: He is well-developed.  HENT:     Head: Normocephalic and atraumatic.  Eyes:     Pupils: Pupils are equal, round, and reactive to light.  Pulmonary:     Effort: Pulmonary effort is normal.     Breath sounds: Normal breath sounds.  Abdominal:     General: Bowel sounds are normal.     Palpations: Abdomen is soft.  Musculoskeletal:     Cervical back: Normal range of motion and  neck supple.     Lumbar back: Negative right straight leg raise test and negative left straight leg raise test.  Skin:    General: Skin is warm and dry.  Neurological:     Mental Status: He is alert and oriented to person, place, and time.  Psychiatric:        Behavior: Behavior normal.        Thought Content: Thought content normal.        Judgment: Judgment normal.    Back Exam   Tenderness  The patient is experiencing tenderness in the lumbar.  Range of Motion  Extension:  abnormal  Flexion:  normal  Lateral bend right:  abnormal  Lateral bend left:  abnormal  Rotation right:  abnormal  Rotation left:  abnormal   Tests  Straight leg raise right: negative Straight leg raise left: negative  Other  Toe walk: abnormal Heel walk: abnormal Gait:  abnormal      Specialty Comments:  MRI LUMBAR SPINE WITHOUT AND WITH CONTRAST  TECHNIQUE: Multiplanar and multiecho pulse sequences of the lumbar spine were obtained without and with intravenous contrast.  CONTRAST: 17 mL MultiHance IV  COMPARISON: Lumbar MRI 09/15/2021  FINDINGS: Segmentation: 5 lumbar vertebra  Alignment: Mild retrolisthesis L4-5 otherwise normal alignment.  Vertebrae: Negative for fracture, mass, or osteomyelitis.  Conus medullaris and cauda equina: Conus extends to the T12-L1 level. Conus and cauda equina appear normal.  Paraspinal and other soft tissues: Negative for paraspinous mass, adenopathy, or fluid collection. Marked atrophy right kidney  Disc levels:  L1-2: Shallow right paracentral disc protrusion unchanged. Negative for stenosis  L2-3: Interval laminectomy and bilateral pedicle screw fusion. No residual spinal stenosis. Diffuse disc bulging and bilateral facet degeneration. Mild to moderate subarticular stenosis bilaterally.  L3-4: Diffuse disc bulging and endplate spurring. Moderate facet degeneration bilaterally. Mild spinal stenosis unchanged. Moderate subarticular and foraminal stenosis bilaterally also unchanged.  L4-5: Central and right-sided disc protrusion. Disc protrusion extends into the right foramen. There is an extruded disc fragment extending caudally behind the L4 vertebral body on the right which has improved in the interval. There remains severe subarticular and foraminal stenosis on the right.. Mild spinal stenosis. Moderate left subarticular stenosis.  L5-S1: Small central disc protrusion. Mild facet hypertrophy. Mild foraminal narrowing bilaterally.  IMPRESSION: Interval laminectomy and pedicle screw fusion L2-3. There remains mild to moderate subarticular stenosis bilaterally.  Mild spinal stenosis L3-4. Moderate subarticular and foraminal stenosis bilaterally unchanged  Central and right-sided disc  protrusion L4-5 causing severe subarticular and foraminal stenosis on the right. The extruded fragment on the right has mildly improved from the prior MRI.   Electronically Signed By: Franchot Gallo M.D. On: 01/06/2022 12:49  Imaging: No results found.   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 s/p DES to RCA in 2007 b. s/p NST in 10/2015 which was intermediate risk and pt preferred medical management at that time   Carotid artery disease ([facility])    Cataract    Chronic idiopathic thrombocytopenia (HCC)    Chronic renal insufficiency    Single kidney   Complication of anesthesia    Coronary atherosclerosis of native coronary vessel    DES RCA 2007   Depression    Essential hypertension    GERD (gastroesophageal reflux disease)    Hx of colonic polyps 01/01/2015   Mixed hyperlipidemia    Myocardial infarction [facility]) 2006-07-05   STEMI    Peripheral vascular disease ([place])     Family History  Problem Relation Age of Onset   Stroke Other    Coronary artery disease Other     Colon cancer Brother        has colostomy    Esophageal cancer Neg Hx    Stomach cancer Neg Hx    Rectal cancer Neg Hx    Colon polyps Neg Hx     Past Surgical History:  Procedure Laterality Date   CARDIAC CATHETERIZATION  2010   Patent stent and nonobsturctive cad following an abnormal cardiolite study may of 2010 with an apical lateral defect   CARPAL TUNNEL RELEASE  11/28/2012   Procedure: CARPAL TUNNEL RELEASE;  Surgeon: Jessy Oto, MD;  Location: [facility];  Service: Orthopedics;  Laterality: Left;  Left anterior submuscular transposition of ulnar nerve at elbow, left open carpal tunnel release   COLONOSCOPY     ELBOW SURGERY Left 2013   ENDARTERECTOMY Left 04/07/2020   Procedure: LEFT CAROTID ENDARTERECTOMY;  Surgeon: Marty Heck, MD;  Location: [facility];  Service: Vascular;  Laterality: Left;   Walker REMOVAL  03/20/2012   Procedure: HARDWARE REMOVAL;  Surgeon: Jessy Oto, MD;  Location: [facility];  Service: Orthopedics;  Laterality: Left;  Hardware removal 3 knowles pins left hip   HERNIA REPAIR     JOINT REPLACEMENT     LUMBAR LAMINECTOMY/DECOMPRESSION MICRODISCECTOMY N/A 10/08/2021   Procedure: Lumbar Two-Three Laminectomy/Foraminotomy, Posterolateral Arthrodesis, Non-Segmental Instrumentation Screw Fixation;  Surgeon: Ashok Pall, MD;  Location: [facility];  Service: Neurosurgery;  Laterality: N/A;   PATCH ANGIOPLASTY Left 04/07/2020   Procedure: Patch Angioplasty Left Carotid;  Surgeon: Marty Heck, MD;  Location: [facility];  Service: Vascular;  Laterality: Left;   TONSILLECTOMY     TOTAL HIP ARTHROPLASTY  03/20/2012   Procedure: TOTAL HIP ARTHROPLASTY;  Surgeon: Jessy Oto, MD;  Location: [facility];  Service: Orthopedics;  Laterality: Left;  Left total hip replacement, ceramic on poly   Social History   Occupational History   Not on file  Tobacco Use   Smoking status: Former     Packs/day: 3.00    Years: 42.00    Total pack years: 126.00    Types: Cigarettes    Start date: 10/30/1960    Quit date: 07/23/2004    Years since quitting: 18.1    Passive exposure: Never   Smokeless tobacco: Never   Tobacco comments:  Counseled to remain smoke free  Vaping Use   Vaping Use: Never used  Substance and Sexual Activity   Alcohol use: No    Alcohol/week: 0.0 standard drinks of alcohol   Drug use: No   Sexual activity: Not on file

## 2022-07-05 NOTE — Patient Instructions (Signed)
Avoid bending, stooping and avoid lifting weights greater than 10 lbs. Avoid prolong standing and walking. Order for a new walker with wheels. Surgery scheduling secretary Kandice Hams, will call you in the next week to schedule for surgery.  Surgery recommended is a three level lumbar fusion L3-4, L4-5 andL5-S1 this would be done with OR microscope Take hydrocodone for for pain. Risk of surgery includes risk of infection 1 in 200 patients, bleeding 1/2% chance you would need a transfusion.   Risk to the nerves is one in 10,000.  Expect improved walking and standing tolerance. Expect relief of leg pain but numbness may persist depending on the length and degree of pressure that has been present.

## 2022-07-05 NOTE — Procedures (Unsigned)
EMG & NCV Findings: Evaluation of the left fibular motor and the left tibial motor nerves showed reduced amplitude (L2.3, L0.8 mV).    Needle evaluation of the left anterior tibialis muscle showed increased insertional activity, widespread spontaneous activity, and diminished recruitment.  The left Fibularis Longus muscle showed widespread spontaneous activity and diminished recruitment.  The left medial gastrocnemius and the left biceps femoris (short head) muscles showed increased insertional activity, increased spontaneous activity, and diminished recruitment.  All remaining muscles (as indicated in the following table) showed no evidence of electrical instability.    Impression: The above electrodiagnostic study is ABNORMAL and reveals evidence of severe subacute L5 and S1 radiculopathy on the left.    There is no significant electrodiagnostic evidence of any other focal nerve entrapment or generalized peripheral neuropathy.   Recommendations: 1.  Follow-up with referring physician. 2.  Continue current management of symptoms. 3.  Suggest MRI and surgical evaluation.  ___________________________ Laurence Spates FAAPMR Board Certified, American Board of Physical Medicine and Rehabilitation    Nerve Conduction Studies Motor Summary Table   Stim Site NR Onset (ms) Norm Onset (ms) O-P Amp (mV) Norm O-P Amp Site1 Site2 Delta-0 (ms) Dist (cm) Vel (m/s) Norm Vel (m/s)  Left Fibular Motor (Ext Dig Brev)  27C  Ankle    3.2 <6.1 *2.3 >2.5 B Fib Ankle 1.5 8.0 53 >38  B Fib    4.7  2.4         Left Tibial Motor (Abd Hall Brev)  27.3C  Ankle    4.5 <6.1 *0.8 >3.0 Knee Ankle 10.0 39.0 39 >35  Knee    14.5  1.1          EMG   Side Muscle Nerve Root Ins Act Fibs Psw Amp Dur Poly Recrt Int Fraser Din Comment  Left AntTibialis Dp Br Peron L4-5 *Incr *4+ *4+ Nml Nml 0 *Reduced Nml   Left Fibularis Longus  Sup Br Peron L5-S1 Nml *4+ *4+ Nml Nml 0 *Reduced Nml   Left MedGastroc Tibial S1-2 *Incr *3+ *3+  Nml Nml 0 *Reduced Nml   Left VastusMed Femoral L2-4 Nml Nml Nml Nml Nml 0 Nml Nml   Left BicepsFemS Sciatic L5-S1 *Incr *3+ *3+ Nml Nml 0 *Reduced Nml     Nerve Conduction Studies Motor Left/Right Comparison   Stim Site L Lat (ms) R Lat (ms) L-R Lat (ms) L Amp (mV) R Amp (mV) L-R Amp (%) Site1 Site2 L Vel (m/s) R Vel (m/s) L-R Vel (m/s)  Fibular Motor (Ext Dig Brev)  27C  Ankle 3.2   *2.3   B Fib Ankle 53    B Fib 4.7   2.4         Tibial Motor (Abd Hall Brev)  27.3C  Ankle 4.5   *0.8   Knee Ankle 39    Knee 14.5   1.1            Waveforms:

## 2022-07-06 NOTE — Progress Notes (Signed)
Derek [name] - 76 y.o. male MRN [id number]  Date of birth: [birth year]  Office Visit Note: Visit Date: 07/01/2022 PCP: Celene Squibb, MD Referred by: Jessy Oto, MD  Subjective: Chief Complaint  Patient presents with   Left Leg - Pain, Weakness   HPI:  Derek [name] is a 76 y.o. male who comes in today at the request of Dr. Basil Dess for electrodiagnostic study of the Left lower extremities.  He reports left leg pain and weakness status post lumbar laminectomy and decompression last year.  He has had prior carpal tunnel release with nerve conduction study of the upper extremity prior to that in 2013.  No electrodiagnostic studies of the lower limb.  He does have a significant heart history with peripheral vascular disease.  Reported with diagnosis of foot drop.  Exam today shows good strength in the lower extremities with dorsiflexion plantarflexion EHL.   ROS Otherwise per HPI.  Assessment & Plan: Visit Diagnoses:    ICD-10-CM   1. Paresthesia of skin  R20.2 NCV with EMG (electromyography)      Plan: Impression: The above electrodiagnostic study is ABNORMAL and reveals evidence of severe subacute L5 and S1 radiculopathy on the left.    There is no significant electrodiagnostic evidence of any other focal nerve entrapment or generalized peripheral neuropathy.   Recommendations: 1.  Follow-up with referring physician. 2.  Continue current management of symptoms. 3.  Suggest MRI and surgical evaluation.  Meds & Orders: No orders of the defined types were placed in this encounter.   Orders Placed This Encounter  Procedures   NCV with EMG (electromyography)    Follow-up: Return for Basil Dess, MD is scheduled.   Procedures: No procedures performed  EMG & NCV Findings: Evaluation of the left fibular motor and the left tibial motor nerves showed reduced amplitude (L2.3, L0.8 mV).    Needle evaluation of the left anterior tibialis muscle showed increased insertional  activity, widespread spontaneous activity, and diminished recruitment.  The left Fibularis Longus muscle showed widespread spontaneous activity and diminished recruitment.  The left medial gastrocnemius and the left biceps femoris (short head) muscles showed increased insertional activity, increased spontaneous activity, and diminished recruitment.  All remaining muscles (as indicated in the following table) showed no evidence of electrical instability.    Impression: The above electrodiagnostic study is ABNORMAL and reveals evidence of severe subacute L5 and S1 radiculopathy on the left.    There is no significant electrodiagnostic evidence of any other focal nerve entrapment or generalized peripheral neuropathy.   Recommendations: 1.  Follow-up with referring physician. 2.  Continue current management of symptoms. 3.  Suggest MRI and surgical evaluation.  ___________________________ Laurence Spates FAAPMR Board Certified, American Board of Physical Medicine and Rehabilitation    Nerve Conduction Studies Motor Summary Table   Stim Site NR Onset (ms) Norm Onset (ms) O-P Amp (mV) Norm O-P Amp Site1 Site2 Delta-0 (ms) Dist (cm) Vel (m/s) Norm Vel (m/s)  Left Fibular Motor (Ext Dig Brev)  27C  Ankle    3.2 <6.1 *2.3 >2.5 B Fib Ankle 1.5 8.0 53 >38  B Fib    4.7  2.4         Left Tibial Motor (Abd Hall Brev)  27.3C  Ankle    4.5 <6.1 *0.8 >3.0 Knee Ankle 10.0 39.0 39 >35  Knee    14.5  1.1          EMG   Side Muscle Nerve Root  Ins Act Fibs Psw Amp Dur Poly Recrt Int Fraser Din Comment  Left AntTibialis Dp Br Peron L4-5 *Incr *4+ *4+ Nml Nml 0 *Reduced Nml   Left Fibularis Longus  Sup Br Peron L5-S1 Nml *4+ *4+ Nml Nml 0 *Reduced Nml   Left MedGastroc Tibial S1-2 *Incr *3+ *3+ Nml Nml 0 *Reduced Nml   Left VastusMed Femoral L2-4 Nml Nml Nml Nml Nml 0 Nml Nml   Left BicepsFemS Sciatic L5-S1 *Incr *3+ *3+ Nml Nml 0 *Reduced Nml     Nerve Conduction Studies Motor Left/Right Comparison   Stim  Site L Lat (ms) R Lat (ms) L-R Lat (ms) L Amp (mV) R Amp (mV) L-R Amp (%) Site1 Site2 L Vel (m/s) R Vel (m/s) L-R Vel (m/s)  Fibular Motor (Ext Dig Brev)  27C  Ankle 3.2   *2.3   B Fib Ankle 53    B Fib 4.7   2.4         Tibial Motor (Abd Hall Brev)  27.3C  Ankle 4.5   *0.8   Knee Ankle 39    Knee 14.5   1.1            Waveforms:        Clinical History: MRI LUMBAR SPINE WITHOUT AND WITH CONTRAST  TECHNIQUE: Multiplanar and multiecho pulse sequences of the lumbar spine were obtained without and with intravenous contrast.  CONTRAST: 17 mL MultiHance IV  COMPARISON: Lumbar MRI 09/15/2021  FINDINGS: Segmentation: 5 lumbar vertebra  Alignment: Mild retrolisthesis L4-5 otherwise normal alignment.  Vertebrae: Negative for fracture, mass, or osteomyelitis.  Conus medullaris and cauda equina: Conus extends to the T12-L1 level. Conus and cauda equina appear normal.  Paraspinal and other soft tissues: Negative for paraspinous mass, adenopathy, or fluid collection. Marked atrophy right kidney  Disc levels:  L1-2: Shallow right paracentral disc protrusion unchanged. Negative for stenosis  L2-3: Interval laminectomy and bilateral pedicle screw fusion. No residual spinal stenosis. Diffuse disc bulging and bilateral facet degeneration. Mild to moderate subarticular stenosis bilaterally.  L3-4: Diffuse disc bulging and endplate spurring. Moderate facet degeneration bilaterally. Mild spinal stenosis unchanged. Moderate subarticular and foraminal stenosis bilaterally also unchanged.  L4-5: Central and right-sided disc protrusion. Disc protrusion extends into the right foramen. There is an extruded disc fragment extending caudally behind the L4 vertebral body on the right which has improved in the interval. There remains severe subarticular and foraminal stenosis on the right.. Mild spinal stenosis. Moderate left subarticular stenosis.  L5-S1: Small central disc protrusion.  Mild facet hypertrophy. Mild foraminal narrowing bilaterally.  IMPRESSION: Interval laminectomy and pedicle screw fusion L2-3. There remains mild to moderate subarticular stenosis bilaterally.  Mild spinal stenosis L3-4. Moderate subarticular and foraminal stenosis bilaterally unchanged  Central and right-sided disc protrusion L4-5 causing severe subarticular and foraminal stenosis on the right. The extruded fragment on the right has mildly improved from the prior MRI.   Electronically Signed By: Franchot Gallo M.D. On: 01/06/2022 12:49     Objective:  VS:  HT:    WT:   BMI:     BP:   HR: bpm  TEMP: ( )  RESP:  Physical Exam Vitals and nursing note reviewed.  Constitutional:      General: He is not in acute distress.    Appearance: Normal appearance. He is well-developed.  HENT:     Head: Normocephalic and atraumatic.  Eyes:     Conjunctiva/sclera: Conjunctivae normal.     Pupils: Pupils are equal, round, and reactive  to light.  Cardiovascular:     Rate and Rhythm: Normal rate.     Pulses: Normal pulses.     Heart sounds: Normal heart sounds.  Pulmonary:     Effort: Pulmonary effort is normal. No respiratory distress.  Musculoskeletal:     Cervical back: Normal range of motion and neck supple. No rigidity.     Right lower leg: No edema.     Left lower leg: No edema.     Comments: Patient has good distal strength bilaterally with EHL dorsiflexion plantarflexion.  Somewhat mildly weaker left than right but with still good strength.  No significant swelling.  He does have some dysesthesia in more of an L5 distribution on the left.  Positive slump test.  Skin:    General: Skin is warm and dry.     Findings: No erythema or rash.  Neurological:     General: No focal deficit present.     Mental Status: He is alert and oriented to person, place, and time.     Sensory: Sensory deficit present.     Motor: Weakness present.     Coordination: Coordination normal.      Gait: Gait abnormal.  Psychiatric:        Mood and Affect: Mood normal.        Behavior: Behavior normal.      Imaging: No results found.

## 2022-07-14 ENCOUNTER — Telehealth: Payer: Self-pay

## 2022-07-14 NOTE — Telephone Encounter (Signed)
Patient called inquiring about surgery that was discussed on 07/05/22.  No surgery sheet. Please advise how you want to handle.  Thanks!

## 2022-07-15 NOTE — Telephone Encounter (Signed)
Printed for [employer]

## 2022-07-22 NOTE — Telephone Encounter (Signed)
Printed for [employer]

## 2022-07-24 ENCOUNTER — Other Ambulatory Visit: Payer: Self-pay | Admitting: Surgery

## 2022-07-27 ENCOUNTER — Other Ambulatory Visit: Payer: Self-pay | Admitting: Orthopaedic Surgery

## 2022-07-29 DIAGNOSIS — N281 Cyst of kidney, acquired: Secondary | ICD-10-CM | POA: Diagnosis not present

## 2022-07-29 DIAGNOSIS — N261 Atrophy of kidney (terminal): Secondary | ICD-10-CM | POA: Diagnosis not present

## 2022-07-29 DIAGNOSIS — N1831 Chronic kidney disease, stage 3a: Secondary | ICD-10-CM | POA: Diagnosis not present

## 2022-08-05 ENCOUNTER — Ambulatory Visit: Payer: Medicare Other | Admitting: Specialist

## 2022-08-05 ENCOUNTER — Encounter: Payer: Self-pay | Admitting: Specialist

## 2022-08-05 VITALS — BP 135/71 | HR 76 | Ht 70.0 in | Wt 186.0 lb

## 2022-08-05 DIAGNOSIS — M5416 Radiculopathy, lumbar region: Secondary | ICD-10-CM | POA: Diagnosis not present

## 2022-08-05 DIAGNOSIS — M21372 Foot drop, left foot: Secondary | ICD-10-CM | POA: Diagnosis not present

## 2022-08-05 DIAGNOSIS — M5126 Other intervertebral disc displacement, lumbar region: Secondary | ICD-10-CM

## 2022-08-05 DIAGNOSIS — M48062 Spinal stenosis, lumbar region with neurogenic claudication: Secondary | ICD-10-CM | POA: Diagnosis not present

## 2022-08-05 NOTE — Progress Notes (Signed)
Office Visit Note   Patient: Derek [name]           Date of Birth: 08/15/1946           MRN: [id number] Visit Date: 08/05/2022              Requested by: Celene Squibb, MD [place],  [place] [address] PCP: Celene Squibb, MD   Assessment & Plan: Visit Diagnoses:  1. Lumbar radiculopathy   2. Foot drop, left   3. Herniation of lumbar intervertebral disc   4. Spinal stenosis of lumbar region with neurogenic claudication     Plan: Avoid bending, stooping and avoid lifting weights greater than 10 lbs. Avoid prolong standing and walking. Avoid frequent bending and stooping  No lifting greater than 10 lbs. May use ice or moist heat for pain. Weight loss is of benefit. Recommend L3-4 and L4-5 decompressive laminectomies with decompression of left and right lateral recesses L3-4 and L4-5 and possible excision of HNP L4-5. Follow-Up Instructions: No follow-ups on file.   Orders:  No orders of the defined types were placed in this encounter.  No orders of the defined types were placed in this encounter.     Procedures: No procedures performed   Clinical Data: No additional findings.   Subjective: No chief complaint on file.   76 year old male with history of back and buttock and leg pain underwent previous L2-3 TLIF for disc protrusion and DDD. With this he has had persistent  back buttock and left leg greater than right leg pain. No bowel or bladder discomfort. Pain is into the left dorsal foot. EMG and NCV show severe L5 and S1 radiculopathy on the left side. MRI from Jan 2023 with central disc herniation L4-5 with bilateral left greater right dorsiflexion weakness. He was released by Dr. Cyndy Freeze in January but is still having severe back buttock and leg pain.    Review of Systems  Constitutional: Negative.   HENT: Negative.    Eyes: Negative.   Respiratory: Negative.    Cardiovascular: Negative.   Gastrointestinal: Negative.   Endocrine: Negative.    Genitourinary: Negative.   Musculoskeletal: Negative.   Skin: Negative.   Allergic/Immunologic: Negative.   Neurological: Negative.   Hematological: Negative.   Psychiatric/Behavioral: Negative.       Objective: Vital Signs: BP 135/71 (BP Location: Left Arm, Patient Position: Sitting, Cuff Size: Large)   Pulse 76   Ht '5\' 10"'$  (1.778 m)   Wt 186 lb (84.4 kg)   BMI 26.69 kg/m   Physical Exam Constitutional:      Appearance: He is well-developed.  HENT:     Head: Normocephalic and atraumatic.  Eyes:     Pupils: Pupils are equal, round, and reactive to light.  Pulmonary:     Effort: Pulmonary effort is normal.     Breath sounds: Normal breath sounds.  Abdominal:     General: Bowel sounds are normal.     Palpations: Abdomen is soft.  Musculoskeletal:     Cervical back: Normal range of motion and neck supple.     Lumbar back: Negative right straight leg raise test and negative left straight leg raise test.  Skin:    General: Skin is warm and dry.  Neurological:     Mental Status: He is alert and oriented to person, place, and time.  Psychiatric:        Behavior: Behavior normal.  Thought Content: Thought content normal.        Judgment: Judgment normal.    Back Exam   Tenderness  The patient is experiencing tenderness in the lumbar.  Range of Motion  Extension:  abnormal  Flexion:  abnormal  Lateral bend right:  abnormal  Lateral bend left:  abnormal  Rotation right:  abnormal  Rotation left:  abnormal   Muscle Strength  Right Quadriceps:  5/5  Left Quadriceps:  5/5  Right Hamstrings:  5/5  Left Hamstrings:  5/5   Tests  Straight leg raise right: negative Straight leg raise left: negative  Reflexes  Patellar:  0/4 Achilles:  0/4  Other  Toe walk: abnormal Heel walk: abnormal Sensation: decreased  Comments:  Left foot drop 4/5 right foot DF weak 5-/5.      Specialty Comments:  MRI LUMBAR SPINE WITHOUT AND WITH  CONTRAST  TECHNIQUE: Multiplanar and multiecho pulse sequences of the lumbar spine were obtained without and with intravenous contrast.  CONTRAST: 17 mL MultiHance IV  COMPARISON: Lumbar MRI 09/15/2021  FINDINGS: Segmentation: 5 lumbar vertebra  Alignment: Mild retrolisthesis L4-5 otherwise normal alignment.  Vertebrae: Negative for fracture, mass, or osteomyelitis.  Conus medullaris and cauda equina: Conus extends to the T12-L1 level. Conus and cauda equina appear normal.  Paraspinal and other soft tissues: Negative for paraspinous mass, adenopathy, or fluid collection. Marked atrophy right kidney  Disc levels:  L1-2: Shallow right paracentral disc protrusion unchanged. Negative for stenosis  L2-3: Interval laminectomy and bilateral pedicle screw fusion. No residual spinal stenosis. Diffuse disc bulging and bilateral facet degeneration. Mild to moderate subarticular stenosis bilaterally.  L3-4: Diffuse disc bulging and endplate spurring. Moderate facet degeneration bilaterally. Mild spinal stenosis unchanged. Moderate subarticular and foraminal stenosis bilaterally also unchanged.  L4-5: Central and right-sided disc protrusion. Disc protrusion extends into the right foramen. There is an extruded disc fragment extending caudally behind the L4 vertebral body on the right which has improved in the interval. There remains severe subarticular and foraminal stenosis on the right.. Mild spinal stenosis. Moderate left subarticular stenosis.  L5-S1: Small central disc protrusion. Mild facet hypertrophy. Mild foraminal narrowing bilaterally.  IMPRESSION: Interval laminectomy and pedicle screw fusion L2-3. There remains mild to moderate subarticular stenosis bilaterally.  Mild spinal stenosis L3-4. Moderate subarticular and foraminal stenosis bilaterally unchanged  Central and right-sided disc protrusion L4-5 causing severe subarticular and foraminal stenosis on the right.  The extruded fragment on the right has mildly improved from the prior MRI.   Electronically Signed By: Franchot Gallo M.D. On: 01/06/2022 12:49  Imaging: No results found.   PMFS History: Patient Active Problem List   Diagnosis Date Noted   Cubital tunnel syndrome on left 11/28/2012    Priority: High    Class: Chronic   Carpal tunnel syndrome on left 11/28/2012    Priority: Medium     Class: Chronic   Lumbar stenosis with neurogenic claudication 10/08/2021   Leg swelling 09/08/2021   Carotid stenosis, asymptomatic, left 04/07/2020   PVD (peripheral vascular disease) ([facility] 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 s/p DES to RCA in 2007 b. s/p NST in 10/2015 which was intermediate risk and pt preferred medical management at that time   Carotid artery disease ([facility])    Cataract    Chronic idiopathic thrombocytopenia (HCC)    Chronic renal insufficiency    Single kidney   Complication of anesthesia    Coronary atherosclerosis of native coronary vessel    DES RCA 2007   Depression    Essential hypertension    GERD (gastroesophageal reflux disease)    Hx of colonic polyps 01/01/2015   Mixed hyperlipidemia    Myocardial infarction ([facility]) 2006-07-05   STEMI    Peripheral vascular disease (HCC)    PONV (postoperative nausea and vomiting)     Family History  Problem Relation Age of Onset   Stroke Other    Coronary artery disease Other    Colon cancer Brother        has  colostomy    Esophageal cancer Neg Hx    Stomach cancer Neg Hx    Rectal cancer Neg Hx    Colon polyps Neg Hx     Past Surgical History:  Procedure Laterality Date   CARDIAC CATHETERIZATION  2010   Patent stent and nonobsturctive cad following an abnormal cardiolite study may of 2010 with an apical lateral defect   CARPAL TUNNEL RELEASE  11/28/2012   Procedure: CARPAL TUNNEL RELEASE;  Surgeon: Jessy Oto, MD;  Location: [facility];  Service: Orthopedics;  Laterality: Left;  Left anterior submuscular transposition of ulnar nerve at elbow, left open carpal tunnel release   COLONOSCOPY     ELBOW SURGERY Left 2013   ENDARTERECTOMY Left 04/07/2020   Procedure: LEFT CAROTID ENDARTERECTOMY;  Surgeon: Marty Heck, MD;  Location: [facility];  Service: Vascular;  Laterality: Left;   [place] REMOVAL  03/20/2012   Procedure: HARDWARE REMOVAL;  Surgeon: Jessy Oto, MD;  Location: [facility];  Service: Orthopedics;  Laterality: Left;  Hardware removal 3 knowles pins left hip   HERNIA REPAIR     JOINT REPLACEMENT     LUMBAR LAMINECTOMY/DECOMPRESSION MICRODISCECTOMY N/A 10/08/2021   Procedure: Lumbar Two-Three Laminectomy/Foraminotomy, Posterolateral Arthrodesis, Non-Segmental Instrumentation Screw Fixation;  Surgeon: Ashok Pall, MD;  Location: [facility];  Service: Neurosurgery;  Laterality: N/A;   PATCH ANGIOPLASTY Left 04/07/2020   Procedure: Patch Angioplasty Left Carotid;  Surgeon: Marty Heck, MD;  Location: [facility];  Service: Vascular;  Laterality: Left;   TONSILLECTOMY     TOTAL HIP ARTHROPLASTY  03/20/2012   Procedure: TOTAL HIP ARTHROPLASTY;  Surgeon: Jessy Oto, MD;  Location: [facility];  Service: Orthopedics;  Laterality: Left;  Left total hip replacement, ceramic on poly   Social History   Occupational History   Not on file  Tobacco Use   Smoking status: Former    Packs/day: 3.00    Years: 42.00     Total pack years: 126.00    Types: Cigarettes    Start date: 10/30/1960    Quit date: 07/23/2004    Years since quitting: 18.0   Smokeless tobacco: Never   Tobacco comments:    Counseled to remain smoke free  Vaping Use   Vaping Use: Never used  Substance and Sexual Activity   Alcohol  use: No    Alcohol/week: 0.0 standard drinks of alcohol   Drug use: No   Sexual activity: Not on file

## 2022-08-05 NOTE — Patient Instructions (Signed)
  Plan: Avoid bending, stooping and avoid lifting weights greater than 10 lbs. Avoid prolong standing and walking. Avoid frequent bending and stooping  No lifting greater than 10 lbs. May use ice or moist heat for pain. Weight loss is of benefit. Recommend L3-4 and L4-5 decompressive laminectomies with decompression of left and right lateral recesses L3-4 and L4-5 and possible excision of HNP L4-5. Follow-Up Instructions: No follow-ups on file.

## 2022-08-27 ENCOUNTER — Ambulatory Visit: Payer: Medicare Other | Admitting: Orthopaedic Surgery

## 2022-09-01 ENCOUNTER — Ambulatory Visit: Payer: Medicare Other | Attending: Cardiology

## 2022-09-01 ENCOUNTER — Ambulatory Visit: Payer: Medicare Other | Attending: Cardiology | Admitting: Cardiology

## 2022-09-01 ENCOUNTER — Encounter: Payer: Self-pay | Admitting: *Deleted

## 2022-09-01 ENCOUNTER — Encounter: Payer: Self-pay | Admitting: Cardiology

## 2022-09-01 VITALS — BP 148/70 | HR 77 | Ht 70.0 in | Wt 198.0 lb

## 2022-09-01 DIAGNOSIS — I25119 Atherosclerotic heart disease of native coronary artery with unspecified angina pectoris: Secondary | ICD-10-CM | POA: Diagnosis not present

## 2022-09-01 DIAGNOSIS — I6523 Occlusion and stenosis of bilateral carotid arteries: Secondary | ICD-10-CM

## 2022-09-01 DIAGNOSIS — Z01818 Encounter for other preprocedural examination: Secondary | ICD-10-CM

## 2022-09-01 DIAGNOSIS — I484 Atypical atrial flutter: Secondary | ICD-10-CM | POA: Diagnosis not present

## 2022-09-01 LAB — ECHOCARDIOGRAM COMPLETE
AR max vel: 2.18 cm2
AV Peak grad: 8.3 mmHg
Ao pk vel: 1.44 m/s
Area-P 1/2: 5.88 cm2
Calc EF: 47.7 %
Height: 70 in
MV M vel: 3.53 m/s
MV Peak grad: 49.8 mmHg
S' Lateral: 3.82 cm
Single Plane A2C EF: 42.7 %
Single Plane A4C EF: 50 %
Weight: 3168 oz

## 2022-09-01 NOTE — Progress Notes (Signed)
Cardiology Office Note  Date: 09/01/2022   ID: Derek [name], Derek [name] Dec 20, 1945, MRN [id number]  PCP:  Derek Squibb, MD  Cardiologist:  [name], MD Electrophysiologist:  None   Chief Complaint  Patient presents with   Cardiac follow-up    History of Present Illness: Derek [name] is a 76 y.o. male last seen in March by Ms. Strader PA-C, I reviewed the note.  He is here for a routine visit.  He does not report any active angina symptoms at this time, no nitroglycerin use.  He has been having trouble with neuropathic leg pain, using a cane, and has pending evaluation for potential lumbar spine surgery.  He did undergo lumbar surgery for stenosis with neurogenic claudication in October of last year, no obvious perioperative cardiac complications.  Last formal ischemic testing was in 2016.  He has not had a recent echocardiogram.  I personally reviewed his ECG today which shows atypical atrial flutter with 3:1 block, controlled heart rate and nonspecific ST changes.  He sees Dr. Carlis Abbott with [facility] with history of bilateral carotid artery disease, occluded RICA and 60 to 09% LICA stenosis by most recent assessment.  He is asymptomatic.  I reviewed his medications which are outlined below.  RCRI perioperative cardiac risk index is class III, intermediate risk at 6.6% chance of major adverse cardiac event.  Past Medical History:  Diagnosis Date   Anxiety    Arthritis    Atypical atrial flutter (HCC)    CAD (coronary artery disease)    a. s/p DES to RCA in 2007 b. s/p NST in 10/2015 which was intermediate risk and pt preferred medical management at that time   Carotid artery disease ([place])    Cataract    Chronic idiopathic thrombocytopenia (HCC)    Chronic renal insufficiency    Single kidney   Complication of anesthesia    Coronary atherosclerosis of native coronary vessel    DES RCA 2007   Depression    Essential hypertension    GERD (gastroesophageal reflux disease)     Hx of colonic polyps 01/01/2015   Mixed hyperlipidemia    Myocardial infarction [facility]) 2006-07-05   STEMI    Peripheral vascular disease [facility])     Past Surgical History:  Procedure Laterality Date   CARDIAC CATHETERIZATION  2010   Patent stent and nonobsturctive cad following an abnormal cardiolite study may of 2010 with an apical lateral defect   CARPAL TUNNEL RELEASE  11/28/2012   Procedure: CARPAL TUNNEL RELEASE;  Surgeon: Jessy Oto, MD;  Location: [place];  Service: Orthopedics;  Laterality: Left;  Left anterior submuscular transposition of ulnar nerve at elbow, left open carpal tunnel release   COLONOSCOPY     ELBOW SURGERY Left 2013   ENDARTERECTOMY Left 04/07/2020   Procedure: LEFT CAROTID ENDARTERECTOMY;  Surgeon: Marty Heck, MD;  Location: [place];  Service: Vascular;  Laterality: Left;   [place] REMOVAL  03/20/2012   Procedure: HARDWARE REMOVAL;  Surgeon: Jessy Oto, MD;  Location: [place];  Service: Orthopedics;  Laterality: Left;  Hardware removal 3 knowles pins left hip   HERNIA REPAIR     JOINT REPLACEMENT     LUMBAR LAMINECTOMY/DECOMPRESSION MICRODISCECTOMY N/A 10/08/2021   Procedure: Lumbar Two-Three Laminectomy/Foraminotomy, Posterolateral Arthrodesis, Non-Segmental Instrumentation Screw Fixation;  Surgeon: Ashok Pall, MD;  Location: [place];  Service: Neurosurgery;  Laterality: N/A;   PATCH ANGIOPLASTY Left 04/07/2020   Procedure: Patch Angioplasty Left Carotid;  Surgeon: Marty Heck, MD;  Location: [place];  Service: Vascular;  Laterality: Left;   TONSILLECTOMY     TOTAL HIP ARTHROPLASTY  03/20/2012   Procedure: TOTAL HIP ARTHROPLASTY;  Surgeon: Jessy Oto, MD;  Location: [place];  Service: Orthopedics;  Laterality: Left;  Left total hip replacement, ceramic on poly    Current Outpatient Medications  Medication Sig Dispense Refill   acetaminophen (TYLENOL) 500 MG  tablet Take 500-1,000 mg by mouth every 6 (six) hours as needed for moderate pain.     allopurinol (ZYLOPRIM) 100 MG tablet Take 200 mg by mouth daily.     aspirin EC 81 MG tablet Take 81 mg by mouth daily. Swallow whole.     atorvastatin (LIPITOR) 20 MG tablet Take 1 tablet (20 mg total) by mouth daily. 90 tablet 3   diazepam (VALIUM) 2 MG tablet Take 2 mg by mouth 2 (two) times daily as needed for anxiety.     ferrous sulfate 325 (65 FE) MG tablet Take 325 mg by mouth daily with breakfast.     furosemide (LASIX) 40 MG tablet Take 20 mg by mouth daily.     hydrALAZINE (APRESOLINE) 25 MG tablet TAKE 1 TABLET BY MOUTH THREE TIMES DAILY 180 tablet 2   metoprolol succinate (TOPROL XL) 25 MG 24 hr tablet Take 1 tablet (25 mg total) by mouth daily. 90 tablet 3   Multiple Vitamins-Minerals (PRESERVISION AREDS PO) Take 1 tablet by mouth in the morning and at bedtime.     nitroGLYCERIN (NITROSTAT) 0.4 MG SL tablet Place 1 tablet (0.4 mg total) under the tongue every 5 (five) minutes x 3 doses as needed (if no relief after 3rd dose, proceed to the ED for an evaluation). For chest pain. If no relief after 3 rd dose, proceed to the ED for an evaluation. 25 tablet 3   Omega-3 Fatty Acids (FISH OIL MAXIMUM STRENGTH) 1200 MG CAPS Take 1,200 mg by mouth in the morning and at bedtime.     omeprazole (PRILOSEC) 20 MG capsule Take 20 mg by mouth daily.       Potassium 99 MG TABS Take 99 mg by mouth daily as needed (cramping in legs).     tiZANidine (ZANAFLEX) 4 MG tablet Take 1 tablet (4 mg total) by mouth every 6 (six) hours as needed for muscle spasms. 60 tablet 0   vitamin B-12 (CYANOCOBALAMIN) 1000 MCG tablet Take 1,000 mcg by mouth daily.     gabapentin (NEURONTIN) 100 MG capsule Take 1 capsule by mouth at bedtime (Patient not taking: Reported on 09/01/2022) 30 capsule 0   No current facility-administered medications for this visit.   Allergies:  Ibuprofen, Nsaids, and Prednisone   Social History: The  patient  reports that he quit smoking about 18 years ago. His smoking use included cigarettes. He started smoking about 61 years ago. He has a 126.00 pack-year smoking history. He has never been exposed to tobacco smoke. He has never used smokeless tobacco. He reports that he does not drink alcohol and does not use drugs.   Family History: The patient's family history includes Colon cancer in his brother; Coronary artery disease in an other family member; Stroke in an other family member.   ROS: No palpitations or syncope.  Physical Exam: VS:  BP (!) 148/70 (BP Location: Left Arm, Patient Position: Sitting, Cuff Size: Normal)  Pulse 77   Ht '5\' 10"'$  (1.778 m)   Wt 198 lb (89.8 kg)   SpO2 95%   BMI 28.41 kg/m , BMI Body mass index is 28.41 kg/m.  Wt Readings from Last 3 Encounters:  09/01/22 198 lb (89.8 kg)  08/05/22 186 lb (84.4 kg)  07/05/22 186 lb (84.4 kg)    General: Patient appears comfortable at rest. HEENT: Conjunctiva and lids normal. Neck: Supple, no elevated JVP, bilateral carotid bruits. Lungs: Clear to auscultation, nonlabored breathing at rest. Cardiac: Regular rate and rhythm, no S3, 1/6 systolic murmur, no pericardial rub. Abdomen: Soft, nontender, bowel sounds present. Extremities: No pitting edema, distal pulses 2+. Skin: Warm and dry. Musculoskeletal: No kyphosis. Neuropsychiatric: Alert and oriented x3, affect grossly appropriate.  ECG:  An ECG dated 09/03/2021 was personally reviewed today and demonstrated:  Atypical atrial flutter with variable conduction and nonspecific ST changes.  Recent Labwork: 09/03/2021: ALT 34; AST 20; B Natriuretic Peptide 346.0; TSH 1.140 09/24/2021: BUN 20; Creatinine, Ser 1.67; Hemoglobin 13.8; Platelets 195; Potassium 3.9; Sodium 140   Other Studies Reviewed Today:  Lexiscan Myoview 11/04/2015: There was no ST segment deviation noted during stress. Findings consistent with prior inferior/inferolateral myocardial infarction  with moderate peri-infarct ischemia. This is an intermediate risk study. The left ventricular ejection fraction is normal (55-65%).  Carotid Dopplers 03/30/2022: Summary:  Right Carotid: Evidence consistent with a total occlusion of the right  ICA. No                 significant change compared to previous study.   Left Carotid: Velocities in the left ICA are consistent with a 60-79%  stenosis.                The ECA appears >50% stenosed. Severity of stenosis likely  over                estimated secondary to contralateral occlusion and vessel  kink.   Vertebrals:  Left vertebral artery demonstrates antegrade flow. Right  vertebral               artery demonstrates no discernable flow.  Subclavians: Normal flow hemodynamics were seen in bilateral subclavian               arteries.   Assessment and Plan:  1.  Preoperative cardiac evaluation in anticipation of potential lumbar spine surgery under general anesthesia per discussion with patient (surgical consultation pending).  He did undergo lumbar spine surgery last October without obvious.  Operative cardiac event.  He has not undergone any specific ischemic work-up since 2016, no recent echocardiogram however.  Reports no active angina with previous history of DES to the RCA and residual disease managed medically.  We will set up a [place] and echocardiogram.  At this point surgical cardiac risk is at least intermediate as discussed above.  2.  CAD status post DES to the RCA in 2007.  Myoview in 2016 showed evidence of inferior/inferolateral scar with moderate peri-infarct ischemia that was managed medically.  He is on aspirin, Lipitor, hydralazine, Toprol-XL, and as needed nitroglycerin.  3.  Permanent atypical atrial flutter with CHA2DS2-VASc score of 4.  He has consistently declined anticoagulation and is on aspirin alone at this time.  Heart rate controlled.  No palpitations.  4.  CKD stage IIIb, last creatinine  1.67.  5.  Bilateral carotid artery disease with occluded RICA and 60 to 71% LICA stenosis by last assessment in April.  He follows with  Dr. Carlis Abbott with [facility].  Medication Adjustments/Labs and Tests Ordered: Current medicines are reviewed at length with the patient today.  Concerns regarding medicines are outlined above.   Tests Ordered: Orders Placed This Encounter  Procedures   NM Myocar Multi W/Spect W/Wall Motion / EF   EKG 12-Lead   ECHOCARDIOGRAM COMPLETE    Medication Changes: No orders of the defined types were placed in this encounter.   Disposition:  Follow up  6 months.  Signed, Satira Sark, MD, [facility] 09/01/2022 1:38 PM    [place] [facility] [place], [place], [place] [address] Phone: [phone]; Fax: [phone]

## 2022-09-01 NOTE — Patient Instructions (Addendum)
Medication Instructions:  Your physician recommends that you continue on your current medications as directed. Please refer to the Current Medication list given to you today.  Labwork: none  Testing/Procedures: Your physician has requested that you have an echocardiogram. Echocardiography is a painless test that uses sound waves to create images of your heart. It provides your doctor with information about the size and shape of your heart and how well your heart's chambers and valves are working. This procedure takes approximately one hour. There are no restrictions for this procedure. Your physician has requested that you have a lexiscan myoview. For further information please visit [email or web address]. Please follow instruction sheet, as given.  Follow-Up: Your physician recommends that you schedule a follow-up appointment in: 6 months  Any Other Special Instructions Will Be Listed Below (If Applicable).  If you need a refill on your cardiac medications before your next appointment, please call your pharmacy.

## 2022-09-03 ENCOUNTER — Telehealth: Payer: Self-pay | Admitting: Cardiology

## 2022-09-03 DIAGNOSIS — Z23 Encounter for immunization: Secondary | ICD-10-CM | POA: Diagnosis not present

## 2022-09-03 NOTE — Telephone Encounter (Signed)
Patient informed. Copy sent to PCP °

## 2022-09-03 NOTE — Telephone Encounter (Signed)
Patient is returning call to discuss echo results. °

## 2022-09-03 NOTE — Telephone Encounter (Signed)
-----   Message from Satira Sark, MD sent at 09/01/2022  5:12 PM EDT ----- Results reviewed.  LVEF low normal range at 50 to 55% with wall motion abnormality consistent with known ischemic heart disease.  No significant valvular abnormalities.  Await results of Myoview to complete preoperative assessment.

## 2022-09-06 DIAGNOSIS — R635 Abnormal weight gain: Secondary | ICD-10-CM | POA: Insufficient documentation

## 2022-09-06 DIAGNOSIS — I4892 Unspecified atrial flutter: Secondary | ICD-10-CM | POA: Diagnosis not present

## 2022-09-06 DIAGNOSIS — M5432 Sciatica, left side: Secondary | ICD-10-CM | POA: Diagnosis not present

## 2022-09-07 ENCOUNTER — Ambulatory Visit: Payer: Medicare Other | Admitting: Orthopaedic Surgery

## 2022-09-08 ENCOUNTER — Ambulatory Visit (HOSPITAL_COMMUNITY)
Admission: RE | Admit: 2022-09-08 | Discharge: 2022-09-08 | Disposition: A | Discharge status: Home / Self Care | Payer: Medicare Other | Source: Ambulatory Visit | Attending: Cardiology | Admitting: Cardiology

## 2022-09-08 DIAGNOSIS — Z01818 Encounter for other preprocedural examination: Secondary | ICD-10-CM | POA: Insufficient documentation

## 2022-09-08 DIAGNOSIS — I25119 Atherosclerotic heart disease of native coronary artery with unspecified angina pectoris: Secondary | ICD-10-CM | POA: Insufficient documentation

## 2022-09-08 LAB — NM MYOCAR MULTI W/SPECT W/WALL MOTION / EF
LV dias vol: 145 mL (ref 62–150)
LV sys vol: 89 mL
Nuc Stress EF: 38 %
Peak HR: 77 {beats}/min
RATE: 0.6
Rest HR: 75 {beats}/min
Rest Nuclear Isotope Dose: 10.3 mCi
SDS: 0
SRS: 2
SSS: 2
ST Depression (mm): 0 mm
Stress Nuclear Isotope Dose: 29.3 mCi
TID: 1.27

## 2022-09-08 MED ORDER — TECHNETIUM TC 99M TETROFOSMIN IV KIT
10.0000 | PACK | Freq: Once | INTRAVENOUS | Status: AC | PRN
  Administered 2022-09-08: 10.3 via INTRAVENOUS

## 2022-09-08 MED ORDER — SODIUM CHLORIDE FLUSH 0.9 % IV SOLN
INTRAVENOUS | Status: AC
  Administered 2022-09-08: 10 mL via INTRAVENOUS
  Filled 2022-09-08: qty 10

## 2022-09-08 MED ORDER — REGADENOSON 0.4 MG/5ML IV SOLN
INTRAVENOUS | Status: AC
  Administered 2022-09-08: 0.4 mg via INTRAVENOUS
  Filled 2022-09-08: qty 5

## 2022-09-08 MED ORDER — TECHNETIUM TC 99M TETROFOSMIN IV KIT
30.0000 | PACK | Freq: Once | INTRAVENOUS | Status: AC | PRN
  Administered 2022-09-08: 29.3 via INTRAVENOUS

## 2022-09-09 ENCOUNTER — Ambulatory Visit: Payer: Medicare Other | Admitting: Orthopedic Surgery

## 2022-09-09 ENCOUNTER — Encounter: Payer: Self-pay | Admitting: Orthopedic Surgery

## 2022-09-09 ENCOUNTER — Ambulatory Visit (INDEPENDENT_AMBULATORY_CARE_PROVIDER_SITE_OTHER): Payer: Medicare Other

## 2022-09-09 VITALS — BP 171/80 | HR 78 | Ht 70.0 in | Wt 198.0 lb

## 2022-09-09 DIAGNOSIS — M5416 Radiculopathy, lumbar region: Secondary | ICD-10-CM | POA: Diagnosis not present
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
## 2022-09-13 ENCOUNTER — Other Ambulatory Visit: Payer: Self-pay | Admitting: Cardiology

## 2022-09-23 ENCOUNTER — Ambulatory Visit: Payer: Self-pay

## 2022-09-23 ENCOUNTER — Ambulatory Visit: Payer: Medicare Other | Admitting: Physical Medicine and Rehabilitation

## 2022-09-23 VITALS — BP 155/80 | HR 76

## 2022-09-23 DIAGNOSIS — M5416 Radiculopathy, lumbar region: Secondary | ICD-10-CM

## 2022-09-23 MED ORDER — METHYLPREDNISOLONE ACETATE 80 MG/ML IJ SUSP
40.0000 mg | Freq: Once | INTRAMUSCULAR | Status: AC
  Administered 2022-09-23: 40 mg

## 2022-09-23 NOTE — Patient Instructions (Signed)

## 2022-09-23 NOTE — Progress Notes (Signed)
Numeric Pain Rating Scale and Functional Assessment Average Pain 0   In the last MONTH (on 0-10 scale) has pain interfered with the following?  1. General activity like being  able to carry out your everyday physical activities such as walking, climbing stairs, carrying groceries, or moving a chair?  Rating(6)   +Driver, -BT- Baby Aspirin, -Dye Allergies.  Has a "lightning strike" all of a sudden. Has pain in lower back and buttocks on left side. Left thigh muscle "gives out" and has cramping in calf all the way to ankle. Had a slip last week where his leg gave out and he almost fell. On no pain medicine  ? Piriformis

## 2022-09-30 ENCOUNTER — Other Ambulatory Visit: Payer: Self-pay | Admitting: *Deleted

## 2022-09-30 DIAGNOSIS — I6523 Occlusion and stenosis of bilateral carotid arteries: Secondary | ICD-10-CM

## 2022-09-30 DIAGNOSIS — M79606 Pain in leg, unspecified: Secondary | ICD-10-CM

## 2022-10-04 NOTE — Progress Notes (Signed)
Derek [name] - 76 y.o. male MRN [id number]  Date of birth: 1946-09-27  Office Visit Note: Visit Date: 09/23/2022 PCP: Celene Squibb, MD Referred by: Callie Fielding, MD  Subjective: Chief Complaint  Patient presents with   Lower Back - Pain   HPI:  Derek [name] is a 76 y.o. male who comes in today at the request of Dr. Ileene Rubens for planned Left L4-5 Lumbar Transforaminal epidural steroid injection with fluoroscopic guidance.  The patient has failed conservative care including home exercise, medications, time and activity modification.  This injection will be diagnostic and hopefully therapeutic.  Please see requesting physician notes for further details and justification.   ROS Otherwise per HPI.  Assessment & Plan: Visit Diagnoses:    ICD-10-CM   1. Lumbar radiculopathy  M54.16 XR C-ARM NO REPORT    Epidural Steroid injection    methylPREDNISolone acetate (DEPO-MEDROL) injection 40 mg      Plan: No additional findings.   Meds & Orders:  Meds ordered this encounter  Medications   methylPREDNISolone acetate (DEPO-MEDROL) injection 40 mg    Orders Placed This Encounter  Procedures   XR C-ARM NO REPORT   Epidural Steroid injection    Follow-up: Return for visit to requesting provider as needed.   Procedures: No procedures performed  Lumbosacral Transforaminal Epidural Steroid Injection - Sub-Pedicular Approach with Fluoroscopic Guidance  Patient: Derek [name]      Date of Birth: Dec 27, 1945 MRN: [id number] PCP: Celene Squibb, MD      Visit Date: 09/23/2022   Universal Protocol:    Date/Time: 09/23/2022  Consent Given By: the patient  Position: PRONE  Additional Comments: Vital signs were monitored before and after the procedure. Patient was prepped and draped in the usual sterile fashion. The correct patient, procedure, and site was verified.   Injection Procedure Details:   Procedure diagnoses: Lumbar radiculopathy [M54.16]    Meds  Administered:  Meds ordered this encounter  Medications   methylPREDNISolone acetate (DEPO-MEDROL) injection 40 mg    Laterality: Left  Location/Site: L5  Needle:5.0 in., 22 ga.  Short bevel or Quincke spinal needle  Needle Placement: Transforaminal  Findings:    -Comments: Excellent flow of contrast along the nerve, nerve root and into the epidural space.  Procedure Details: After squaring off the end-plates to get a true AP view, the C-arm was positioned so that an oblique view of the foramen as noted above was visualized. The target area is just inferior to the "nose of the scotty dog" or sub pedicular. The soft tissues overlying this structure were infiltrated with 2-3 ml. of 1% Lidocaine without Epinephrine.  The spinal needle was inserted toward the target using a "trajectory" view along the fluoroscope beam.  Under AP and lateral visualization, the needle was advanced so it did not puncture dura and was located close the 6 O'Clock position of the pedical in AP tracterory. Biplanar projections were used to confirm position. Aspiration was confirmed to be negative for CSF and/or blood. A 1-2 ml. volume of Isovue-250 was injected and flow of contrast was noted at each level. Radiographs were obtained for documentation purposes.   After attaining the desired flow of contrast documented above, a 0.5 to 1.0 ml test dose of 0.25% Marcaine was injected into each respective transforaminal space.  The patient was observed for 90 seconds post injection.  After no sensory deficits were reported, and normal lower extremity motor function was noted,   the above injectate  was administered so that equal amounts of the injectate were placed at each foramen (level) into the transforaminal epidural space.   Additional Comments:  The patient tolerated the procedure well Dressing: 2 x 2 sterile gauze and Band-Aid    Post-procedure details: Patient was observed during the procedure. Post-procedure  instructions were reviewed.  Patient left the clinic in stable condition.    Clinical History: MRI LUMBAR SPINE WITHOUT AND WITH CONTRAST  TECHNIQUE: Multiplanar and multiecho pulse sequences of the lumbar spine were obtained without and with intravenous contrast.  CONTRAST: 17 mL MultiHance IV  COMPARISON: Lumbar MRI 09/15/2021  FINDINGS: Segmentation: 5 lumbar vertebra  Alignment: Mild retrolisthesis L4-5 otherwise normal alignment.  Vertebrae: Negative for fracture, mass, or osteomyelitis.  Conus medullaris and cauda equina: Conus extends to the T12-L1 level. Conus and cauda equina appear normal.  Paraspinal and other soft tissues: Negative for paraspinous mass, adenopathy, or fluid collection. Marked atrophy right kidney  Disc levels:  L1-2: Shallow right paracentral disc protrusion unchanged. Negative for stenosis  L2-3: Interval laminectomy and bilateral pedicle screw fusion. No residual spinal stenosis. Diffuse disc bulging and bilateral facet degeneration. Mild to moderate subarticular stenosis bilaterally.  L3-4: Diffuse disc bulging and endplate spurring. Moderate facet degeneration bilaterally. Mild spinal stenosis unchanged. Moderate subarticular and foraminal stenosis bilaterally also unchanged.  L4-5: Central and right-sided disc protrusion. Disc protrusion extends into the right foramen. There is an extruded disc fragment extending caudally behind the L4 vertebral body on the right which has improved in the interval. There remains severe subarticular and foraminal stenosis on the right.. Mild spinal stenosis. Moderate left subarticular stenosis.  L5-S1: Small central disc protrusion. Mild facet hypertrophy. Mild foraminal narrowing bilaterally.  IMPRESSION: Interval laminectomy and pedicle screw fusion L2-3. There remains mild to moderate subarticular stenosis bilaterally.  Mild spinal stenosis L3-4. Moderate subarticular and foraminal stenosis  bilaterally unchanged  Central and right-sided disc protrusion L4-5 causing severe subarticular and foraminal stenosis on the right. The extruded fragment on the right has mildly improved from the prior MRI.   Electronically Signed By: Franchot Gallo M.D. On: 01/06/2022 12:49     Objective:  VS:  HT:    WT:   BMI:     BP:(!) 155/80  HR:76bpm  TEMP: ( )  RESP:  Physical Exam Vitals and nursing note reviewed.  Constitutional:      General: He is not in acute distress.    Appearance: Normal appearance. He is not ill-appearing.  HENT:     Head: Normocephalic and atraumatic.     Right Ear: External ear normal.     Left Ear: External ear normal.     Nose: No congestion.  Eyes:     Extraocular Movements: Extraocular movements intact.  Cardiovascular:     Rate and Rhythm: Normal rate.     Pulses: Normal pulses.  Pulmonary:     Effort: Pulmonary effort is normal. No respiratory distress.  Abdominal:     General: There is no distension.     Palpations: Abdomen is soft.  Musculoskeletal:        General: No tenderness or signs of injury.     Cervical back: Neck supple.     Right lower leg: No edema.     Left lower leg: No edema.     Comments: Patient has good distal strength without clonus.  Skin:    Findings: No erythema or rash.  Neurological:     General: No focal deficit present.  Mental Status: He is alert and oriented to person, place, and time.     Sensory: No sensory deficit.     Motor: No weakness or abnormal muscle tone.     Coordination: Coordination normal.  Psychiatric:        Mood and Affect: Mood normal.        Behavior: Behavior normal.      Imaging: No results found.

## 2022-10-04 NOTE — Procedures (Signed)
Lumbosacral Transforaminal Epidural Steroid Injection - Sub-Pedicular Approach with Fluoroscopic Guidance  Patient: Derek [name]      Date of Birth: [birth year] MRN: [id number] PCP: Celene Squibb, MD      Visit Date: 09/23/2022   Universal Protocol:    Date/Time: 09/23/2022  Consent Given By: the patient  Position: PRONE  Additional Comments: Vital signs were monitored before and after the procedure. Patient was prepped and draped in the usual sterile fashion. The correct patient, procedure, and site was verified.   Injection Procedure Details:   Procedure diagnoses: Lumbar radiculopathy [M54.16]    Meds Administered:  Meds ordered this encounter  Medications   methylPREDNISolone acetate (DEPO-MEDROL) injection 40 mg    Laterality: Left  Location/Site: L5  Needle:5.0 in., 22 ga.  Short bevel or Quincke spinal needle  Needle Placement: Transforaminal  Findings:    -Comments: Excellent flow of contrast along the nerve, nerve root and into the epidural space.  Procedure Details: After squaring off the end-plates to get a true AP view, the C-arm was positioned so that an oblique view of the foramen as noted above was visualized. The target area is just inferior to the "nose of the scotty dog" or sub pedicular. The soft tissues overlying this structure were infiltrated with 2-3 ml. of 1% Lidocaine without Epinephrine.  The spinal needle was inserted toward the target using a "trajectory" view along the fluoroscope beam.  Under AP and lateral visualization, the needle was advanced so it did not puncture dura and was located close the 6 O'Clock position of the pedical in AP tracterory. Biplanar projections were used to confirm position. Aspiration was confirmed to be negative for CSF and/or blood. A 1-2 ml. volume of Isovue-250 was injected and flow of contrast was noted at each level. Radiographs were obtained for documentation purposes.   After attaining the desired flow of  contrast documented above, a 0.5 to 1.0 ml test dose of 0.25% Marcaine was injected into each respective transforaminal space.  The patient was observed for 90 seconds post injection.  After no sensory deficits were reported, and normal lower extremity motor function was noted,   the above injectate was administered so that equal amounts of the injectate were placed at each foramen (level) into the transforaminal epidural space.   Additional Comments:  The patient tolerated the procedure well Dressing: 2 x 2 sterile gauze and Band-Aid    Post-procedure details: Patient was observed during the procedure. Post-procedure instructions were reviewed.  Patient left the clinic in stable condition.

## 2022-10-11 ENCOUNTER — Ambulatory Visit: Payer: Medicare Other | Admitting: Orthopedic Surgery

## 2022-10-11 DIAGNOSIS — M5416 Radiculopathy, lumbar region: Secondary | ICD-10-CM
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
## 2022-10-18 ENCOUNTER — Encounter (HOSPITAL_COMMUNITY): Payer: Self-pay | Admitting: Physician Assistant

## 2022-10-18 ENCOUNTER — Encounter (HOSPITAL_COMMUNITY): Payer: Self-pay | Admitting: Orthopedic Surgery

## 2022-10-18 ENCOUNTER — Other Ambulatory Visit: Payer: Self-pay

## 2022-10-18 NOTE — Anesthesia Preprocedure Evaluation (Deleted)
Anesthesia Evaluation    Airway        Dental   Pulmonary former smoker          Cardiovascular hypertension,      Neuro/Psych    GI/Hepatic   Endo/Other    Renal/GU      Musculoskeletal   Abdominal   Peds  Hematology   Anesthesia Other Findings   Reproductive/Obstetrics                              Anesthesia Physical Anesthesia Plan  ASA:   Anesthesia Plan:    Post-op Pain Management:    Induction:   PONV Risk Score and Plan:   Airway Management Planned:   Additional Equipment:   Intra-op Plan:   Post-operative Plan:   Informed Consent:   Plan Discussed with:   Anesthesia Plan Comments: (PAT note by Karoline Caldwell, PA-C: [facility] for history of CAD s/p DES to RCA in 2007, permanent a flutter (on ASA alone, declines anticoagulation).  He was last seen by Dr. Domenic Polite on 09/01/2022 for preop evaluation.  Per note, " Preoperative cardiac evaluation in anticipation of potential lumbar spine surgery under general anesthesia per discussion with patient (surgical consultation pending).  He did undergo lumbar spine surgery last October without obvious.  Operative cardiac event.  He has not undergone any specific ischemic work-up since 2016, no recent echocardiogram however.  Reports no active angina with previous history of DES to the RCA and residual disease managed medically.  We will set up a Lexiscan Myoview and echocardiogram.  At this point surgical cardiac risk is at least intermediate as discussed above."  Echo showed EF 50 to 55%, wall motion abnormality consistent with known ischemic heart disease, no significant valvular abnormalities.  Nuclear stress showed evidence of prior infarct, no evidence of ischemia.  Dr. Armanda Heritage commented on these studies stating patient okay to proceed with surgery as overall intermediate risk from perioperative cardiac perspective.  He also  follows with vascular surgery for history of known right carotid artery occlusion and s/p left carotid endarterectomy 04/07/2020 for asymptomatic high-grade stenosis.  Last seen by Dr. Carlis Abbott 03/30/2022.  Carotid duplex is stable at that time.  Per note, "He continues to do well and is asymptomatic from his carotid disease.  In evaluating his velocities they have been stable on each 6 month visit at 265/97 --> 253/79 --> 234/78 in the left ICA where he had carotid endarterectomy.  Again I think these velocities are overestimated given contralateral occlusion and tortuosity in the vessel.  Follow-up in 6 months with carotid duplex.  Again discussed in the absence of symptoms there would be no indication for intervention unless greater than 80% recurrent stenosis."   History of single kidney and chronic renal insufficiency.  Per review of records, baseline creatinine ~1.7.  Patient will need day of surgery labs and evaluation.   EKG 09/01/2022: NSR.  Rate 76.  T wave abnormality, consider inferior ischemia.  Carotid duplex 03/30/2022: Summary:  Right Carotid: Evidence consistent with a total occlusion of the right ICA. No significant change compared to previous study.  Left Carotid: Velocities in the left ICA are consistent with a 60-79% stenosis. The ECA appears >50% stenosed. Severity of stenosis likely over estimated secondary to contralateral occlusion and vessel kink.  Vertebrals: Left vertebral artery demonstrates antegrade flow. Right vertebral artery demonstrates no discernable flow.  Subclavians: Normal flow hemodynamics were seen in bilateral subclavian  arteries.   Nuclear stress 09/08/2022:   Findings are consistent with prior inferolateral myocardial infarction. The study is intermediate risk.   No ST deviation was noted.   LV perfusion is abnormal. There is no evidence of ischemia. There is evidence of infarction. Defect 1: There is a medium defect with severe reduction in uptake present in  the apical to mid inferolateral location(s) that is fixed. There is abnormal wall motion in the defect area. Consistent with infarction.   Left ventricular function is abnormal. Global function is moderately reduced. Nuclear stress EF: 38 %. The left ventricular ejection fraction is moderately decreased (30-44%). End diastolic cavity size is mildly enlarged.   Inferolateral infarct pattern was seen on prior nuclear stress test.   TTE 09/01/2022:  1. Left ventricular ejection fraction, by estimation, is 50 to 55%. The  left ventricle has low normal function. The left ventricle demonstrates  regional wall motion abnormalities (see scoring diagram/findings for  description). There is mild asymmetric  left ventricular hypertrophy of the septal segment. Left ventricular  diastolic parameters are indeterminate. The average left ventricular  global longitudinal strain is -18.2 %. The global longitudinal strain is  normal.   2. Right ventricular systolic function is normal. The right ventricular  size is normal. There is normal pulmonary artery systolic pressure. The  estimated right ventricular systolic pressure is 76.1 mmHg.   3. Left atrial size was mildly dilated.   4. The mitral valve is grossly normal. Trivial mitral valve  regurgitation.   5. The aortic valve is tricuspid. Aortic valve regurgitation is not  visualized. Aortic valve sclerosis/calcification is present, without any  evidence of aortic stenosis.   6. The inferior vena cava is normal in size with greater than 50%  respiratory variability, suggesting right atrial pressure of 3 mmHg.    )         Anesthesia Quick Evaluation

## 2022-10-18 NOTE — Progress Notes (Signed)
Anesthesia Chart Review: Same day workup  Follows cardiology for history of CAD s/p DES to RCA in 2007, permanent a flutter (on ASA alone, declines anticoagulation).  He was last seen by Dr. Domenic Polite on 09/01/2022 for preop evaluation.  Per note, " Preoperative cardiac evaluation in anticipation of potential lumbar spine surgery under general anesthesia per discussion with patient (surgical consultation pending).  He did undergo lumbar spine surgery last October without obvious.  Operative cardiac event.  He has not undergone any specific ischemic work-up since 2016, no recent echocardiogram however.  Reports no active angina with previous history of DES to the RCA and residual disease managed medically.  We will set up a Lexiscan Myoview and echocardiogram.  At this point surgical cardiac risk is at least intermediate as discussed above."  Echo showed EF 50 to 55%, wall motion abnormality consistent with known ischemic heart disease, no significant valvular abnormalities.  Nuclear stress showed evidence of prior infarct, no evidence of ischemia.  Dr. Armanda Heritage commented on these studies stating patient okay to proceed with surgery as overall intermediate risk from perioperative cardiac perspective.  He also follows with vascular surgery for history of known right carotid artery occlusion and s/p left carotid endarterectomy 04/07/2020 for asymptomatic high-grade stenosis.  Last seen by Dr. Carlis Abbott 03/30/2022.  Carotid duplex is stable at that time.  Per note, "He continues to do well and is asymptomatic from his carotid disease.  In evaluating his velocities they have been stable on each 6 month visit at 265/97 --> 253/79 --> 234/78 in the left ICA where he had carotid endarterectomy.  Again I think these velocities are overestimated given contralateral occlusion and tortuosity in the vessel.  Follow-up in 6 months with carotid duplex.  Again discussed in the absence of symptoms there would be no indication for  intervention unless greater than 80% recurrent stenosis."   History of single kidney and chronic renal insufficiency.  Per review of records, baseline creatinine ~1.7.  Patient will need day of surgery labs and evaluation.   EKG 09/01/2022: NSR.  Rate 76.  T wave abnormality, consider inferior ischemia.  Carotid duplex 03/30/2022: Summary:  Right Carotid: Evidence consistent with a total occlusion of the right ICA. No significant change compared to previous study.  Left Carotid: Velocities in the left ICA are consistent with a 60-79% stenosis. The ECA appears >50% stenosed. Severity of stenosis likely over estimated secondary to contralateral occlusion and vessel kink.  Vertebrals: Left vertebral artery demonstrates antegrade flow. Right vertebral artery demonstrates no discernable flow.  Subclavians: Normal flow hemodynamics were seen in bilateral subclavian arteries.   Nuclear stress 09/08/2022:   Findings are consistent with prior inferolateral myocardial infarction. The study is intermediate risk.   No ST deviation was noted.   LV perfusion is abnormal. There is no evidence of ischemia. There is evidence of infarction. Defect 1: There is a medium defect with severe reduction in uptake present in the apical to mid inferolateral location(s) that is fixed. There is abnormal wall motion in the defect area. Consistent with infarction.   Left ventricular function is abnormal. Global function is moderately reduced. Nuclear stress EF: 38 %. The left ventricular ejection fraction is moderately decreased (30-44%). End diastolic cavity size is mildly enlarged.   Inferolateral infarct pattern was seen on prior nuclear stress test.   TTE 09/01/2022:  1. Left ventricular ejection fraction, by estimation, is 50 to 55%. The  left ventricle has low normal function. The left ventricle demonstrates  regional  wall motion abnormalities (see scoring diagram/findings for  description). There is mild asymmetric   left ventricular hypertrophy of the septal segment. Left ventricular  diastolic parameters are indeterminate. The average left ventricular  global longitudinal strain is -18.2 %. The global longitudinal strain is  normal.   2. Right ventricular systolic function is normal. The right ventricular  size is normal. There is normal pulmonary artery systolic pressure. The  estimated right ventricular systolic pressure is 21.6 mmHg.   3. Left atrial size was mildly dilated.   4. The mitral valve is grossly normal. Trivial mitral valve  regurgitation.   5. The aortic valve is tricuspid. Aortic valve regurgitation is not  visualized. Aortic valve sclerosis/calcification is present, without any  evidence of aortic stenosis.   6. The inferior vena cava is normal in size with greater than 50%  respiratory variability, suggesting right atrial pressure of 3 mmHg.     Wynonia Musty [facility] Stay [facility]/Anesthesiology Phone [phone] 10/18/2022 10:26 AM

## 2022-10-18 NOTE — Progress Notes (Signed)
Derek [name] denies chest pain or shortness of breath. Patient denies having any s/s of Covid in his household, also denies any known exposure to Covid.   DerekOffenberger's PCP is Dr. Casimer Leek, cardiologist is Dr. Myles Gip, who saw patient  and had an EKG and an Echo done in September.   I asked anesthesia PA-C to review the chart.

## 2022-10-19 ENCOUNTER — Ambulatory Visit: Payer: Medicare Other | Admitting: Vascular Surgery

## 2022-10-19 ENCOUNTER — Encounter (HOSPITAL_COMMUNITY): Payer: Medicare Other

## 2022-10-19 DIAGNOSIS — M48062 Spinal stenosis, lumbar region with neurogenic claudication: Secondary | ICD-10-CM

## 2022-10-27 DIAGNOSIS — C44519 Basal cell carcinoma of skin of other part of trunk: Secondary | ICD-10-CM | POA: Diagnosis not present

## 2022-10-27 DIAGNOSIS — D692 Other nonthrombocytopenic purpura: Secondary | ICD-10-CM | POA: Diagnosis not present

## 2022-10-27 DIAGNOSIS — D485 Neoplasm of uncertain behavior of skin: Secondary | ICD-10-CM | POA: Diagnosis not present

## 2022-10-27 DIAGNOSIS — L821 Other seborrheic keratosis: Secondary | ICD-10-CM | POA: Diagnosis not present

## 2022-10-27 DIAGNOSIS — C44319 Basal cell carcinoma of skin of other parts of face: Secondary | ICD-10-CM | POA: Diagnosis not present

## 2022-10-27 DIAGNOSIS — C4441 Basal cell carcinoma of skin of scalp and neck: Secondary | ICD-10-CM | POA: Diagnosis not present

## 2022-10-27 DIAGNOSIS — L570 Actinic keratosis: Secondary | ICD-10-CM | POA: Diagnosis not present

## 2022-10-27 DIAGNOSIS — Z85828 Personal history of other malignant neoplasm of skin: Secondary | ICD-10-CM | POA: Diagnosis not present

## 2022-10-27 DIAGNOSIS — D1801 Hemangioma of skin and subcutaneous tissue: Secondary | ICD-10-CM | POA: Diagnosis not present

## 2022-10-28 ENCOUNTER — Encounter (HOSPITAL_COMMUNITY): Payer: Self-pay | Admitting: Orthopedic Surgery

## 2022-10-28 ENCOUNTER — Other Ambulatory Visit: Payer: Self-pay

## 2022-10-28 NOTE — Progress Notes (Signed)
Derek [name] denies chest pain or shortness of breath.  Patient denies having any s/s of Covid in her household, also denies any known exposure to Covid.   Derek [name]'s PCP is Dr. Casimer Leek, cardiologist is Dr. Marylouise Stacks.

## 2022-10-29 ENCOUNTER — Inpatient Hospital Stay (HOSPITAL_COMMUNITY): Admission: RE | Admit: 2022-10-29 | Payer: Medicare Other | Source: Ambulatory Visit | Admitting: Orthopedic Surgery

## 2022-10-29 DIAGNOSIS — M48062 Spinal stenosis, lumbar region with neurogenic claudication: Secondary | ICD-10-CM

## 2022-10-29 HISTORY — DX: Unspecified hearing loss, unspecified ear: H91.90

## 2022-10-29 HISTORY — DX: Personal history of urinary calculi: Z87.442

## 2022-10-29 SURGERY — POSTERIOR LUMBAR FUSION 2 LEVEL
Anesthesia: General

## 2022-11-02 ENCOUNTER — Other Ambulatory Visit: Payer: Self-pay

## 2022-11-03 ENCOUNTER — Encounter: Payer: Medicare Other | Admitting: Orthopedic Surgery

## 2022-11-12 ENCOUNTER — Encounter: Payer: Medicare Other | Admitting: Orthopedic Surgery

## 2022-11-16 ENCOUNTER — Ambulatory Visit: Payer: Medicare Other | Admitting: Vascular Surgery

## 2022-11-16 ENCOUNTER — Ambulatory Visit (INDEPENDENT_AMBULATORY_CARE_PROVIDER_SITE_OTHER)
Admission: RE | Admit: 2022-11-16 | Discharge: 2022-11-16 | Disposition: A | Discharge status: Home / Self Care | Payer: Medicare Other | Source: Ambulatory Visit | Attending: Vascular Surgery | Admitting: Vascular Surgery

## 2022-11-16 ENCOUNTER — Encounter: Payer: Self-pay | Admitting: Vascular Surgery

## 2022-11-16 ENCOUNTER — Ambulatory Visit (HOSPITAL_COMMUNITY)
Admission: RE | Admit: 2022-11-16 | Discharge: 2022-11-16 | Disposition: A | Discharge status: Home / Self Care | Payer: Medicare Other | Source: Ambulatory Visit | Attending: Vascular Surgery | Admitting: Vascular Surgery

## 2022-11-16 VITALS — BP 177/89 | HR 75 | Temp 97.8°F | Resp 18 | Ht 70.0 in | Wt 197.0 lb

## 2022-11-16 DIAGNOSIS — I6523 Occlusion and stenosis of bilateral carotid arteries: Secondary | ICD-10-CM

## 2022-11-16 DIAGNOSIS — M79606 Pain in leg, unspecified: Secondary | ICD-10-CM

## 2022-11-16 DIAGNOSIS — I739 Peripheral vascular disease, unspecified: Secondary | ICD-10-CM

## 2022-11-16 NOTE — Progress Notes (Signed)
Patient name: Derek [name] MRN: [id number] DOB: March 21, 1946 Sex: male  REASON FOR VISIT: 6 month follow-up for carotid artery surveillance  HPI: Derek [name] is a 76 y.o. male who presents for 6 month follow-up and surveillance of his carotid artery disease.  He has a known right carotid artery occlusion.  He underwent a left carotid endarterectomy on 04/07/2020 for an asymptomatic high-grade stenosis by myself.  We have been following a 60 to 79% stenosis on the left after carotid endarterectomy.  He denies any associated neurologic events since last follow-up.  Scheduled to have L4-L5 spine surgery.  Past Medical History:  Diagnosis Date   Anxiety    09/17/2022- patient denies.   Arthritis    Atypical atrial flutter (HCC)    CAD (coronary artery disease)    a. s/p DES to RCA in 2007 b. s/p NST in 10/2015 which was intermediate risk and pt preferred medical management at that time   Carotid artery disease ([place])    Cataract    Chronic idiopathic thrombocytopenia (HCC)    Chronic renal insufficiency    Single kidney   Complication of anesthesia    Coronary atherosclerosis of native coronary vessel    DES RCA 2007   Depression    10/18/22 - patient denies.   Dysrhythmia    Essential hypertension    GERD (gastroesophageal reflux disease)    History of kidney stones    years ago   HOH (hard of hearing)    Hx of colonic polyps 01/01/2015   Mixed hyperlipidemia    Myocardial infarction [facility]) 2006-07-05   STEMI    Peripheral vascular disease (HCC)    PONV (postoperative nausea and vomiting)     Past Surgical History:  Procedure Laterality Date   CARDIAC CATHETERIZATION  2010   Patent stent and nonobsturctive cad following an abnormal cardiolite study may of 2010 with an apical lateral defect   CARPAL TUNNEL RELEASE  11/28/2012   Procedure: CARPAL TUNNEL RELEASE;  Surgeon: Jessy Oto, MD;  Location: [place];  Service: Orthopedics;  Laterality: Left;  Left anterior submuscular  transposition of ulnar nerve at elbow, left open carpal tunnel release   COLONOSCOPY     ELBOW SURGERY Left 2013   ENDARTERECTOMY Left 04/07/2020   Procedure: LEFT CAROTID ENDARTERECTOMY;  Surgeon: Marty Heck, MD;  Location: [place];  Service: Vascular;  Laterality: Left;   [place] REMOVAL  03/20/2012   Procedure: HARDWARE REMOVAL;  Surgeon: Jessy Oto, MD;  Location: [place];  Service: Orthopedics;  Laterality: Left;  Hardware removal 3 knowles pins left hip   HERNIA REPAIR     JOINT REPLACEMENT     LUMBAR LAMINECTOMY/DECOMPRESSION MICRODISCECTOMY N/A 10/08/2021   Procedure: Lumbar Two-Three Laminectomy/Foraminotomy, Posterolateral Arthrodesis, Non-Segmental Instrumentation Screw Fixation;  Surgeon: Ashok Pall, MD;  Location: [place];  Service: Neurosurgery;  Laterality: N/A;   PATCH ANGIOPLASTY Left 04/07/2020   Procedure: Patch Angioplasty Left Carotid;  Surgeon: Marty Heck, MD;  Location: [place];  Service: Vascular;  Laterality: Left;   TONSILLECTOMY     TOTAL HIP ARTHROPLASTY  03/20/2012   Procedure: TOTAL HIP ARTHROPLASTY;  Surgeon: Jessy Oto, MD;  Location: [place];  Service: Orthopedics;  Laterality: Left;  Left total hip replacement, ceramic on poly    Family History  Problem Relation Age of Onset  Stroke Other    Coronary artery disease Other    Colon cancer Brother        has colostomy    Esophageal cancer Neg Hx    Stomach cancer Neg Hx    Rectal cancer Neg Hx    Colon polyps Neg Hx     SOCIAL HISTORY: Social History   Tobacco Use   Smoking status: Former    Packs/day: 3.00    Years: 42.00    Total pack years: 126.00    Types: Cigarettes    Start date: 10/30/1960    Quit date: 07/23/2004    Years since quitting: 18.3    Passive exposure: Never   Smokeless tobacco: Never   Tobacco comments:    Counseled to remain smoke free  Substance Use Topics   Alcohol  use: No    Alcohol/week: 0.0 standard drinks of alcohol    Allergies  Allergen Reactions   Ibuprofen Other (See Comments)    One kidney Pt has one Kidney    Nsaids Other (See Comments)    One Kidney   Prednisone Swelling    Causes swelling in legs and feet     Current Outpatient Medications  Medication Sig Dispense Refill   allopurinol (ZYLOPRIM) 100 MG tablet Take 100 mg by mouth 2 (two) times daily.     aspirin EC 81 MG tablet Take 81 mg by mouth daily. Swallow whole.     atorvastatin (LIPITOR) 20 MG tablet Take 1 tablet (20 mg total) by mouth daily. 90 tablet 3   diazepam (VALIUM) 2 MG tablet Take 2 mg by mouth 2 (two) times daily as needed for anxiety.     ferrous sulfate 325 (65 FE) MG tablet Take 325 mg by mouth daily with breakfast.     furosemide (LASIX) 80 MG tablet Take 40 mg by mouth daily.     hydrALAZINE (APRESOLINE) 25 MG tablet TAKE 1 TABLET BY MOUTH THREE TIMES DAILY 270 tablet 0   metoprolol succinate (TOPROL XL) 25 MG 24 hr tablet Take 1 tablet (25 mg total) by mouth daily. 90 tablet 3   Multiple Vitamins-Minerals (PRESERVISION AREDS PO) Take 1 tablet by mouth in the morning and at bedtime.     nitroGLYCERIN (NITROSTAT) 0.4 MG SL tablet Place 1 tablet (0.4 mg total) under the tongue every 5 (five) minutes x 3 doses as needed (if no relief after 3rd dose, proceed to the ED for an evaluation). For chest pain. If no relief after 3 rd dose, proceed to the ED for an evaluation. 25 tablet 3   Omega-3 Fatty Acids (FISH OIL MAXIMUM STRENGTH) 1200 MG CAPS Take 1,200 mg by mouth in the morning and at bedtime.     omeprazole (PRILOSEC) 20 MG capsule Take 20 mg by mouth daily.       Potassium 99 MG TABS Take 99 mg by mouth daily as needed (Cramping).     tiZANidine (ZANAFLEX) 4 MG tablet Take 1 tablet (4 mg total) by mouth every 6 (six) hours as needed for muscle spasms. 60 tablet 0   vitamin B-12 (CYANOCOBALAMIN) 500 MCG tablet Take 500 mcg by mouth daily.      Chlorpheniramine-APAP (CORICIDIN) 2-325 MG TABS Take 1 tablet by mouth daily as needed (Cold). (Patient not taking: Reported on 11/16/2022)     gabapentin (NEURONTIN) 100 MG capsule Take 1 capsule by mouth at bedtime (Patient not taking: Reported on 09/01/2022) 30 capsule 0   No current facility-administered medications for this visit.  REVIEW OF SYSTEMS:  '[X]'$  denotes positive finding, '[ ]'$  denotes negative finding Cardiac  Comments:  Chest pain or chest pressure:    Shortness of breath upon exertion:    Short of breath when lying flat:    Irregular heart rhythm:        Vascular    Pain in calf, thigh, or hip brought on by ambulation:    Pain in feet at night that wakes you up from your sleep:     Blood clot in your veins:    Leg swelling:         Pulmonary    Oxygen at home:    Productive cough:     Wheezing:         Neurologic    Sudden weakness in arms or legs:     Sudden numbness in arms or legs:     Sudden onset of difficulty speaking or slurred speech:    Temporary loss of vision in one eye:     Problems with dizziness:         Gastrointestinal    Blood in stool:     Vomited blood:         Genitourinary    Burning when urinating:     Blood in urine:        Psychiatric    Major depression:         Hematologic    Bleeding problems:    Problems with blood clotting too easily:        Skin    Rashes or ulcers:        Constitutional    Fever or chills:      PHYSICAL EXAM: Vitals:   11/16/22 1431 11/16/22 1437  BP: (!) 177/87 (!) 177/89  Pulse: 75 75  Resp: 18   Temp: 97.8 F (36.6 C)   TempSrc: Temporal   SpO2: 96%   Weight: 197 lb (89.4 kg)   Height: '5\' 10"'$  (1.778 m)     GENERAL: The patient is a well-nourished male, in no acute distress. The vital signs are documented above. CARDIAC: There is a regular rate and rhythm.  VASCULAR:  Left neck incision well healed. PULMONARY: No respiratory distress. ABDOMEN: Soft and  non-tender. MUSCULOSKELETAL: There are no major deformities or cyanosis. NEUROLOGIC: No focal weakness or paresthesias are detected. Cn II-XII appear grossly intact.     DATA:   Carotid duplex today again shows right ICA occlusion and a left ICA velocity consistent with 60-79% stenosis with velocity 199/71 (previously234/78) and I suspect this is overestimated given contralateral occlusion and tortuosity in the vessel.    ABIs today were 1.01 on the right biphasic and 0.75 on the left biphasic   Assessment/Plan:  76 year old male status post left carotid endarterectomy on 04/07/2020 for an asymptomatic high-grade stenosis in the setting of known right ICA occlusion.  He continues to do well and is asymptomatic from his carotid disease.  In evaluating his velocities they have been stable on each 6 month visit at 265/97 --> 253/79 --> 234/78 --> 199/71 in the left ICA where he had carotid endarterectomy.  Again I think these velocities are overestimated given contralateral occlusion and tortuosity in the vessel.  Follow-up in 6 months with carotid duplex.  Again discussed in the absence of symptoms there would be no indication for intervention unless greater than 80% recurrent stenosis.    Also of note he had ABIs today that were 1.01 on the right biphasic and 0.75  on the left monophasic.  I can palpate a pulse in the right foot although I cannot palpate a pulse in the left foot.  Really does not have any lower extremity symptoms to warrant intervention from a PAD standpoint.  We can continue to monitor this.  Does have back and left hip pain and having L3-4 and L4-5 PLIF later this month.   Marty Heck, MD [facility] Office: [phone]

## 2022-11-22 ENCOUNTER — Other Ambulatory Visit: Payer: Self-pay

## 2022-11-22 DIAGNOSIS — I6523 Occlusion and stenosis of bilateral carotid arteries: Secondary | ICD-10-CM

## 2022-11-22 NOTE — Pre-Procedure Instructions (Addendum)
Surgical Instructions    Your procedure is scheduled on November 30, 2022.  Report to [facility] Main Entrance "A" at 5:30 A.M., then check in with the Admitting office.  Call this number if you have problems the morning of surgery:  [phone]   If you have any questions prior to your surgery date call [phone]: Open Monday-Friday 8am-4pm If you experience any cold or flu symptoms such as cough, fever, chills, shortness of breath, etc. between now and your scheduled surgery, please notify us at the above number     Remember:  Do not eat after midnight the night before your surgery  You may drink clear liquids until 4:30 AM the morning of your surgery.   Clear liquids allowed are: Water, Non-Citrus Juices (without pulp), Carbonated Beverages, Clear Tea, Black Coffee Only (NO MILK, CREAM OR POWDERED CREAMER of any kind), and Gatorade.  Patient Instructions  The night before surgery:  No food after midnight. ONLY clear liquids after midnight  The day of surgery (if you do NOT have diabetes):  Drink ONE (1) Pre-Surgery Clear Ensure by 4:30 am the morning of surgery. Drink in one sitting. Do not sip.  This drink was given to you during your hospital  pre-op appointment visit.  Nothing else to drink after completing the  Pre-Surgery Clear Ensure.         If you have questions, please contact your surgeon's office.      Take these medicines the morning of surgery with A SIP OF WATER:  allopurinol (ZYLOPRIM)   atorvastatin (LIPITOR)   hydrALAZINE (APRESOLINE)   metoprolol succinate (TOPROL XL)   omeprazole (PRILOSEC)    Take these medicines the morning of surgery with a sip of water AS NEEDED:  diazepam (VALIUM)   nitroGLYCERIN (NITROSTAT)    Follow your surgeon's instructions on when to stop Aspirin.  If no instructions were given by your surgeon then you will need to call the office to get those instructions.     As of today, STOP taking any Aleve, Naproxen,  Ibuprofen, Motrin, Advil, Goody's, BC's, all herbal medications, fish oil, and all vitamins.                     Do NOT Smoke (Tobacco/Vaping) for 24 hours prior to your procedure.  If you use a CPAP at night, you may bring your mask/headgear for your overnight stay.   Contacts, glasses, piercing's, hearing aid's, dentures or partials may not be worn into surgery, please bring cases for these belongings.    For patients admitted to the hospital, discharge time will be determined by your treatment team.   Patients discharged the day of surgery will not be allowed to drive home, and someone needs to stay with them for 24 hours.  SURGICAL WAITING ROOM VISITATION Patients having surgery or a procedure may have no more than 2 support people in the waiting area - these visitors may rotate.   Children under the age of 80 must have an adult with them who is not the patient. If the patient needs to stay at the hospital during part of their recovery, the visitor guidelines for inpatient rooms apply. Pre-op nurse will coordinate an appropriate time for 1 support person to accompany patient in pre-op.  This support person may not rotate.   Please refer to the [facility] website for the visitor guidelines for Inpatients (after your surgery is over and you are in a regular room).    Special instructions:  [place]- Preparing For Surgery  Before surgery, you can play an important role. Because skin is not sterile, your skin needs to be as free of germs as possible. You can reduce the number of germs on your skin by washing with CHG (chlorahexidine gluconate) Soap before surgery.  CHG is an antiseptic cleaner which kills germs and bonds with the skin to continue killing germs even after washing.    Oral Hygiene is also important to reduce your risk of infection.  Remember - BRUSH YOUR TEETH THE MORNING OF SURGERY WITH YOUR REGULAR TOOTHPASTE  Please do not use if you have an allergy to CHG or  antibacterial soaps. If your skin becomes reddened/irritated stop using the CHG.  Do not shave (including legs and underarms) for at least 48 hours prior to first CHG shower. It is OK to shave your face.  Please follow these instructions carefully.   Shower the NIGHT BEFORE SURGERY and the MORNING OF SURGERY  If you chose to wash your hair, wash your hair first as usual with your normal shampoo.  After you shampoo, rinse your hair and body thoroughly to remove the shampoo.  Use CHG Soap as you would any other liquid soap. You can apply CHG directly to the skin and wash gently with a scrungie or a clean washcloth.   Apply the CHG Soap to your body ONLY FROM THE NECK DOWN.  Do not use on open wounds or open sores. Avoid contact with your eyes, ears, mouth and genitals (private parts). Wash Face and genitals (private parts)  with your normal soap.   Wash thoroughly, paying special attention to the area where your surgery will be performed.  Thoroughly rinse your body with warm water from the neck down.  DO NOT shower/wash with your normal soap after using and rinsing off the CHG Soap.  Pat yourself dry with a CLEAN TOWEL.  Wear CLEAN PAJAMAS to bed the night before surgery  Place CLEAN SHEETS on your bed the night before your surgery  DO NOT SLEEP WITH PETS.   Day of Surgery: Take a shower with CHG soap. Do not wear jewelry or makeup Do not wear lotions, powders, perfumes/colognes, or deodorant. Do not shave 48 hours prior to surgery.  Men may shave face and neck. Do not bring valuables to the hospital.  [facility] is not responsible for any belongings or valuables. Do not wear nail polish, gel polish, artificial nails, or any other type of covering on natural nails (fingers and toes) If you have artificial nails or gel coating that need to be removed by a nail salon, please have this removed prior to surgery. Artificial nails or gel coating may interfere with anesthesia's ability  to adequately monitor your vital signs.  Wear Clean/Comfortable clothing the morning of surgery Remember to brush your teeth WITH YOUR REGULAR TOOTHPASTE.   Please read over the following fact sheets that you were given.    If you received a COVID test during your pre-op visit  it is requested that you wear a mask when out in public, stay away from anyone that may not be feeling well and notify your surgeon if you develop symptoms. If you have been in contact with anyone that has tested positive in the last 10 days please notify you surgeon.

## 2022-11-23 ENCOUNTER — Other Ambulatory Visit: Payer: Self-pay

## 2022-11-23 ENCOUNTER — Encounter (HOSPITAL_COMMUNITY): Payer: Self-pay

## 2022-11-23 ENCOUNTER — Encounter (HOSPITAL_COMMUNITY)
Admission: RE | Admit: 2022-11-23 | Discharge: 2022-11-23 | Disposition: A | Discharge status: Home / Self Care | Payer: Medicare Other | Source: Ambulatory Visit | Attending: Orthopedic Surgery | Admitting: Orthopedic Surgery

## 2022-11-23 VITALS — BP 147/88 | HR 76 | Temp 98.0°F | Resp 18 | Ht 70.0 in | Wt 198.2 lb

## 2022-11-23 DIAGNOSIS — M5416 Radiculopathy, lumbar region: Secondary | ICD-10-CM | POA: Diagnosis not present

## 2022-11-23 DIAGNOSIS — Z01812 Encounter for preprocedural laboratory examination: Secondary | ICD-10-CM | POA: Diagnosis not present

## 2022-11-23 DIAGNOSIS — Z01818 Encounter for other preprocedural examination: Secondary | ICD-10-CM

## 2022-11-23 LAB — CBC
HCT: 38.9 % — ABNORMAL LOW (ref 39.0–52.0)
Hemoglobin: 12 g/dL — ABNORMAL LOW (ref 13.0–17.0)
MCH: 26.7 pg (ref 26.0–34.0)
MCHC: 30.8 g/dL (ref 30.0–36.0)
MCV: 86.6 fL (ref 80.0–100.0)
Platelets: 112 10*3/uL — ABNORMAL LOW (ref 150–400)
RBC: 4.49 MIL/uL (ref 4.22–5.81)
RDW: 16.9 % — ABNORMAL HIGH (ref 11.5–15.5)
WBC: 5.8 10*3/uL (ref 4.0–10.5)
nRBC: 0 % (ref 0.0–0.2)

## 2022-11-23 LAB — BASIC METABOLIC PANEL
Anion gap: 7 (ref 5–15)
BUN: 20 mg/dL (ref 8–23)
CO2: 25 mmol/L (ref 22–32)
Calcium: 9.3 mg/dL (ref 8.9–10.3)
Chloride: 111 mmol/L (ref 98–111)
Creatinine, Ser: 1.84 mg/dL — ABNORMAL HIGH (ref 0.61–1.24)
GFR, Estimated: 37 mL/min — ABNORMAL LOW (ref >60)
Glucose, Bld: 104 mg/dL — ABNORMAL HIGH (ref 70–99)
Potassium: 3.8 mmol/L (ref 3.5–5.1)
Sodium: 143 mmol/L (ref 135–145)

## 2022-11-23 LAB — SURGICAL PCR SCREEN
MRSA, PCR: NEGATIVE
Staphylococcus aureus: NEGATIVE

## 2022-11-23 NOTE — Progress Notes (Signed)
PCP - Dr. Allyn Kenner Cardiologist - Dr. [name]  PPM/ICD - n/a  Chest x-ray - n/a EKG - 09/01/22 Stress Test - 09/08/22 ECHO - 09/01/22 Cardiac Cath - 2010  Sleep Study - denies CPAP - denies  Last dose of GLP1 agonist-  n/a GLP1 instructions: n/a  Blood Thinner Instructions: n/a Aspirin Instructions: LD 11/22/22.  ERAS Protcol -Clear liquids until 0430 DOS PRE-SURGERY Ensure or G2- (1) Ensure provided.  COVID TEST- n/a  Anesthesia review: Yes, previously consulted but surgery cancelled d/t insurance. F/U with chart as it has been a month since clearance.   Patient denies shortness of breath, fever, cough and chest pain at PAT appointment   All instructions explained to the patient, with a verbal understanding of the material. Patient agrees to go over the instructions while at home for a better understanding. Patient also instructed to self quarantine after being tested for COVID-19. The opportunity to ask questions was provided.

## 2022-11-24 NOTE — Anesthesia Preprocedure Evaluation (Addendum)
Anesthesia Evaluation  Patient identified by MRN, date of birth, ID band Patient awake    Reviewed: Allergy & Precautions, NPO status , Patient's Chart, lab work & pertinent test results, reviewed documented beta blocker date and time   History of Anesthesia Complications (+) PONV and history of anesthetic complications  Airway Mallampati: II  TM Distance: >3 FB Neck ROM: Full    Dental  (+) Edentulous Upper, Edentulous Lower, Dental Advisory Given   Pulmonary former smoker   Pulmonary exam normal breath sounds clear to auscultation       Cardiovascular hypertension, Pt. on home beta blockers and Pt. on medications + CAD, + Past MI, + Cardiac Stents (2007) and + Peripheral Vascular Disease (s/p L CEA 2021)  Normal cardiovascular exam+ dysrhythmias (no anticoagulation) Atrial Fibrillation  Rhythm:Regular Rate:Normal  EKG 09/01/2022: NSR.  Rate 76.  T wave abnormality, consider inferior ischemia.   Carotid duplex 03/30/2022: Summary:  Right Carotid: Evidence consistent with a total occlusion of the right ICA. No significant change compared to previous study.  Left Carotid: Velocities in the left ICA are consistent with a 60-79% stenosis. The ECA appears >50% stenosed. Severity of stenosis likely over estimated secondary to contralateral occlusion and vessel kink.  Vertebrals: Left vertebral artery demonstrates antegrade flow. Right vertebral artery demonstrates no discernable flow.  Subclavians: Normal flow hemodynamics were seen in bilateral subclavian arteries.    Nuclear stress 09/08/2022:   Findings are consistent with prior inferolateral myocardial infarction. The study is intermediate risk.   No ST deviation was noted.   LV perfusion is abnormal. There is no evidence of ischemia. There is evidence of infarction. Defect 1: There is a medium defect with severe reduction in uptake present in the apical to mid inferolateral  location(s) that is fixed. There is abnormal wall motion in the defect area. Consistent with infarction.   Left ventricular function is abnormal. Global function is moderately reduced. Nuclear stress EF: 38 %. The left ventricular ejection fraction is moderately decreased (30-44%). End diastolic cavity size is mildly enlarged.   Inferolateral infarct pattern was seen on prior nuclear stress test.   TTE 09/01/2022:  1. Left ventricular ejection fraction, by estimation, is 50 to 55%. The  left ventricle has low normal function. The left ventricle demonstrates  regional wall motion abnormalities (see scoring diagram/findings for  description). There is mild asymmetric  left ventricular hypertrophy of the septal segment. Left ventricular  diastolic parameters are indeterminate. The average left ventricular  global longitudinal strain is -18.2 %. The global longitudinal strain is  normal.   2. Right ventricular systolic function is normal. The right ventricular  size is normal. There is normal pulmonary artery systolic pressure. The  estimated right ventricular systolic pressure is 23.7 mmHg.   3. Left atrial size was mildly dilated.   4. The mitral valve is grossly normal. Trivial mitral valve  regurgitation.   5. The aortic valve is tricuspid. Aortic valve regurgitation is not  visualized. Aortic valve sclerosis/calcification is present, without any  evidence of aortic stenosis.   6. The inferior vena cava is normal in size with greater than 50%  respiratory variability, suggesting right atrial pressure of 3 mmHg.     Neuro/Psych  PSYCHIATRIC DISORDERS Anxiety Depression    negative neurological ROS     GI/Hepatic negative GI ROS, Neg liver ROS,GERD  ,,  Endo/Other  negative endocrine ROS    Renal/GU Renal InsufficiencyRenal diseaseOne kidney, baseline Cr 1.7  negative genitourinary   Musculoskeletal  (+)  Arthritis ,    Abdominal   Peds  Hematology negative hematology  ROS (+)   Anesthesia Other Findings   Reproductive/Obstetrics                             Anesthesia Physical Anesthesia Plan  ASA: 3  Anesthesia Plan: General   Post-op Pain Management: Tylenol PO (pre-op)* and Ketamine IV*   Induction: Intravenous  PONV Risk Score and Plan: 3 and Dexamethasone, Ondansetron and Treatment may vary due to age or medical condition  Airway Management Planned: Oral ETT  Additional Equipment:   Intra-op Plan:   Post-operative Plan: Extubation in OR  Informed Consent: I have reviewed the patients History and Physical, chart, labs and discussed the procedure including the risks, benefits and alternatives for the proposed anesthesia with the patient or authorized representative who has indicated his/her understanding and acceptance.     Dental advisory given  Plan Discussed with: CRNA  Anesthesia Plan Comments:         Anesthesia Quick Evaluation

## 2022-11-24 NOTE — Progress Notes (Signed)
Anesthesia Chart Review:  [facility] for history of CAD s/p DES to RCA in 2007, permanent a flutter (on ASA alone, declines anticoagulation).  He was last seen by Dr. Domenic Polite on 09/01/2022 for preop evaluation.  Per note, " Preoperative cardiac evaluation in anticipation of potential lumbar spine surgery under general anesthesia per discussion with patient (surgical consultation pending).  He did undergo lumbar spine surgery last October without obvious.  Operative cardiac event.  He has not undergone any specific ischemic work-up since 2016, no recent echocardiogram however.  Reports no active angina with previous history of DES to the RCA and residual disease managed medically.  We will set up a Lexiscan Myoview and echocardiogram.  At this point surgical cardiac risk is at least intermediate as discussed above."  Echo showed EF 50 to 55%, wall motion abnormality consistent with known ischemic heart disease, no significant valvular abnormalities.  Nuclear stress showed evidence of prior infarct, no evidence of ischemia.  Dr. Domenic Polite commented on these studies stating patient okay to proceed with surgery as overall intermediate risk from perioperative cardiac perspective.  He also follows with vascular surgery for history of known right carotid artery occlusion and s/p left carotid endarterectomy 04/07/2020 for asymptomatic high-grade stenosis.  Last seen by Dr. Carlis Abbott 11/16/2022.  Carotid duplex is stable at that time.  Per note, "He continues to do well and is asymptomatic from his carotid disease.  In evaluating his velocities they have been stable on each 6 month visit at 265/97 --> 253/79 --> 234/78 --> 199/71 in the left ICA where he had carotid endarterectomy.  Again I think these velocities are overestimated given contralateral occlusion and tortuosity in the vessel.  Follow-up in 6 months with carotid duplex.  Again discussed in the absence of symptoms there would be no indication for  intervention unless greater than 80% recurrent stenosis. "   History of single kidney and chronic renal insufficiency.  Per review of records, baseline creatinine ~1.7.   Preop labs reviewed, creatinine elevated 1.84 consistent with history of renal insufficiency, mild anemia with hemoglobin 12.0, mild thrombocytopenia with platelets 112k (review of available labs in epic shows this to be chronic).   EKG 09/01/2022: NSR.  Rate 76.  T wave abnormality, consider inferior ischemia.   Carotid duplex 03/30/2022: Summary:  Right Carotid: Evidence consistent with a total occlusion of the right ICA. No significant change compared to previous study.  Left Carotid: Velocities in the left ICA are consistent with a 60-79% stenosis. The ECA appears >50% stenosed. Severity of stenosis likely over estimated secondary to contralateral occlusion and vessel kink.  Vertebrals: Left vertebral artery demonstrates antegrade flow. Right vertebral artery demonstrates no discernable flow.  Subclavians: Normal flow hemodynamics were seen in bilateral subclavian arteries.    Nuclear stress 09/08/2022:   Findings are consistent with prior inferolateral myocardial infarction. The study is intermediate risk.   No ST deviation was noted.   LV perfusion is abnormal. There is no evidence of ischemia. There is evidence of infarction. Defect 1: There is a medium defect with severe reduction in uptake present in the apical to mid inferolateral location(s) that is fixed. There is abnormal wall motion in the defect area. Consistent with infarction.   Left ventricular function is abnormal. Global function is moderately reduced. Nuclear stress EF: 38 %. The left ventricular ejection fraction is moderately decreased (30-44%). End diastolic cavity size is mildly enlarged.   Inferolateral infarct pattern was seen on prior nuclear stress test.   TTE  09/01/2022:  1. Left ventricular ejection fraction, by estimation, is 50 to 55%. The  left  ventricle has low normal function. The left ventricle demonstrates  regional wall motion abnormalities (see scoring diagram/findings for  description). There is mild asymmetric  left ventricular hypertrophy of the septal segment. Left ventricular  diastolic parameters are indeterminate. The average left ventricular  global longitudinal strain is -18.2 %. The global longitudinal strain is  normal.   2. Right ventricular systolic function is normal. The right ventricular  size is normal. There is normal pulmonary artery systolic pressure. The  estimated right ventricular systolic pressure is 35.5 mmHg.   3. Left atrial size was mildly dilated.   4. The mitral valve is grossly normal. Trivial mitral valve  regurgitation.   5. The aortic valve is tricuspid. Aortic valve regurgitation is not  visualized. Aortic valve sclerosis/calcification is present, without any  evidence of aortic stenosis.   6. The inferior vena cava is normal in size with greater than 50%  respiratory variability, suggesting right atrial pressure of 3 mmHg.       Wynonia Musty [facility] Short Stay Center/Anesthesiology Phone [phone] 11/24/2022 12:47 PM

## 2022-11-30 ENCOUNTER — Encounter (HOSPITAL_COMMUNITY)
Admission: RE | Disposition: A | Discharge status: Home / Self Care | Payer: Self-pay | Source: Home / Self Care | Attending: Orthopedic Surgery

## 2022-11-30 ENCOUNTER — Inpatient Hospital Stay (HOSPITAL_COMMUNITY)
Admission: RE | Admit: 2022-11-30 | Discharge: 2022-12-02 | DRG: 454 | Disposition: A | Discharge status: Home / Self Care | Payer: Medicare Other | Attending: Orthopedic Surgery | Admitting: Orthopedic Surgery

## 2022-11-30 ENCOUNTER — Inpatient Hospital Stay (HOSPITAL_COMMUNITY): Payer: Medicare Other | Admitting: Physician Assistant

## 2022-11-30 ENCOUNTER — Other Ambulatory Visit: Payer: Self-pay

## 2022-11-30 ENCOUNTER — Encounter (HOSPITAL_COMMUNITY): Payer: Self-pay | Admitting: Orthopedic Surgery

## 2022-11-30 ENCOUNTER — Inpatient Hospital Stay (HOSPITAL_COMMUNITY): Payer: Medicare Other

## 2022-11-30 DIAGNOSIS — I700 Atherosclerosis of aorta: Secondary | ICD-10-CM | POA: Diagnosis not present

## 2022-11-30 DIAGNOSIS — E876 Hypokalemia: Secondary | ICD-10-CM | POA: Diagnosis present

## 2022-11-30 DIAGNOSIS — M545 Low back pain, unspecified: Secondary | ICD-10-CM | POA: Diagnosis present

## 2022-11-30 DIAGNOSIS — D62 Acute posthemorrhagic anemia: Secondary | ICD-10-CM | POA: Diagnosis not present

## 2022-11-30 DIAGNOSIS — Z87891 Personal history of nicotine dependence: Secondary | ICD-10-CM

## 2022-11-30 DIAGNOSIS — M5416 Radiculopathy, lumbar region: Secondary | ICD-10-CM | POA: Diagnosis not present

## 2022-11-30 DIAGNOSIS — N189 Chronic kidney disease, unspecified: Secondary | ICD-10-CM | POA: Diagnosis not present

## 2022-11-30 DIAGNOSIS — I358 Other nonrheumatic aortic valve disorders: Secondary | ICD-10-CM | POA: Diagnosis present

## 2022-11-30 DIAGNOSIS — I252 Old myocardial infarction: Secondary | ICD-10-CM

## 2022-11-30 DIAGNOSIS — M960 Pseudarthrosis after fusion or arthrodesis: Secondary | ICD-10-CM

## 2022-11-30 DIAGNOSIS — I2510 Atherosclerotic heart disease of native coronary artery without angina pectoris: Secondary | ICD-10-CM | POA: Diagnosis not present

## 2022-11-30 DIAGNOSIS — Z9889 Other specified postprocedural states: Secondary | ICD-10-CM | POA: Diagnosis not present

## 2022-11-30 DIAGNOSIS — M48061 Spinal stenosis, lumbar region without neurogenic claudication: Principal | ICD-10-CM | POA: Diagnosis present

## 2022-11-30 DIAGNOSIS — I739 Peripheral vascular disease, unspecified: Secondary | ICD-10-CM | POA: Diagnosis present

## 2022-11-30 DIAGNOSIS — M48062 Spinal stenosis, lumbar region with neurogenic claudication: Principal | ICD-10-CM

## 2022-11-30 DIAGNOSIS — I10 Essential (primary) hypertension: Secondary | ICD-10-CM

## 2022-11-30 DIAGNOSIS — I6521 Occlusion and stenosis of right carotid artery: Secondary | ICD-10-CM | POA: Diagnosis not present

## 2022-11-30 DIAGNOSIS — Z981 Arthrodesis status: Secondary | ICD-10-CM | POA: Diagnosis not present

## 2022-11-30 DIAGNOSIS — M5136 Other intervertebral disc degeneration, lumbar region: Secondary | ICD-10-CM | POA: Diagnosis not present

## 2022-11-30 DIAGNOSIS — I119 Hypertensive heart disease without heart failure: Secondary | ICD-10-CM | POA: Diagnosis not present

## 2022-11-30 DIAGNOSIS — I129 Hypertensive chronic kidney disease with stage 1 through stage 4 chronic kidney disease, or unspecified chronic kidney disease: Secondary | ICD-10-CM | POA: Diagnosis present

## 2022-11-30 HISTORY — PX: FORAMINOTOMY 2 LEVEL: SHX5836

## 2022-11-30 LAB — CBC
HCT: 27.4 % — ABNORMAL LOW (ref 39.0–52.0)
Hemoglobin: 8.5 g/dL — ABNORMAL LOW (ref 13.0–17.0)
MCH: 27.1 pg (ref 26.0–34.0)
MCHC: 31 g/dL (ref 30.0–36.0)
MCV: 87.3 fL (ref 80.0–100.0)
Platelets: 101 10*3/uL — ABNORMAL LOW (ref 150–400)
RBC: 3.14 MIL/uL — ABNORMAL LOW (ref 4.22–5.81)
RDW: 16.9 % — ABNORMAL HIGH (ref 11.5–15.5)
WBC: 7.9 10*3/uL (ref 4.0–10.5)
nRBC: 0 % (ref 0.0–0.2)

## 2022-11-30 SURGERY — POSTERIOR LUMBAR FUSION 2 LEVEL
Anesthesia: General

## 2022-11-30 MED ORDER — OXYCODONE HCL 5 MG PO TABS
5.0000 mg | ORAL_TABLET | ORAL | Status: DC | PRN
  Administered 2022-11-30: 10 mg via ORAL
  Filled 2022-11-30: qty 1
  Filled 2022-11-30: qty 2

## 2022-11-30 MED ORDER — ONDANSETRON HCL 4 MG/2ML IJ SOLN
INTRAMUSCULAR | Status: AC
  Filled 2022-11-30: qty 2

## 2022-11-30 MED ORDER — DEXAMETHASONE SODIUM PHOSPHATE 10 MG/ML IJ SOLN: 10.0000 mg | Freq: Once | INTRAMUSCULAR | Status: DC

## 2022-11-30 MED ORDER — DOCUSATE SODIUM 100 MG PO CAPS
100.0000 mg | ORAL_CAPSULE | Freq: Two times a day (BID) | ORAL | Status: DC
  Administered 2022-11-30 – 2022-12-02 (×4): 100 mg via ORAL
  Filled 2022-11-30 (×4): qty 1

## 2022-11-30 MED ORDER — KETAMINE HCL 10 MG/ML IJ SOLN
INTRAMUSCULAR | Status: DC | PRN
  Administered 2022-11-30 (×2): 10 mg via INTRAVENOUS
  Administered 2022-11-30: 20 mg via INTRAVENOUS
  Administered 2022-11-30 (×2): 10 mg via INTRAVENOUS
  Administered 2022-11-30: 20 mg via INTRAVENOUS

## 2022-11-30 MED ORDER — ORAL CARE MOUTH RINSE: 15.0000 mL | Freq: Once | OROMUCOSAL | Status: AC

## 2022-11-30 MED ORDER — NITROGLYCERIN 0.4 MG SL SUBL: 0.4000 mg | SUBLINGUAL_TABLET | SUBLINGUAL | Status: DC | PRN

## 2022-11-30 MED ORDER — ALLOPURINOL 100 MG PO TABS
100.0000 mg | ORAL_TABLET | Freq: Two times a day (BID) | ORAL | Status: DC
  Administered 2022-11-30 – 2022-12-02 (×4): 100 mg via ORAL
  Filled 2022-11-30 (×4): qty 1

## 2022-11-30 MED ORDER — LIDOCAINE 2% (20 MG/ML) 5 ML SYRINGE
INTRAMUSCULAR | Status: DC | PRN
  Administered 2022-11-30: 60 mg via INTRAVENOUS

## 2022-11-30 MED ORDER — CEFAZOLIN SODIUM 1 G IJ SOLR
INTRAMUSCULAR | Status: AC
  Filled 2022-11-30: qty 20

## 2022-11-30 MED ORDER — ALBUMIN HUMAN 5 % IV SOLN: INTRAVENOUS | Status: DC | PRN

## 2022-11-30 MED ORDER — 0.9 % SODIUM CHLORIDE (POUR BTL) OPTIME
TOPICAL | Status: DC | PRN
  Administered 2022-11-30: 1000 mL

## 2022-11-30 MED ORDER — FENTANYL CITRATE (PF) 100 MCG/2ML IJ SOLN: 25.0000 ug | INTRAMUSCULAR | Status: DC | PRN

## 2022-11-30 MED ORDER — HYDRALAZINE HCL 25 MG PO TABS
25.0000 mg | ORAL_TABLET | Freq: Three times a day (TID) | ORAL | Status: DC
  Administered 2022-11-30 – 2022-12-02 (×7): 25 mg via ORAL
  Filled 2022-11-30 (×7): qty 1

## 2022-11-30 MED ORDER — PROPOFOL 500 MG/50ML IV EMUL
INTRAVENOUS | Status: DC | PRN
  Administered 2022-11-30 (×2): 75 ug/kg/min via INTRAVENOUS
  Administered 2022-11-30: 250 ug/kg/min via INTRAVENOUS

## 2022-11-30 MED ORDER — LIDOCAINE 2% (20 MG/ML) 5 ML SYRINGE
INTRAMUSCULAR | Status: AC
  Filled 2022-11-30: qty 5

## 2022-11-30 MED ORDER — KETAMINE HCL 50 MG/5ML IJ SOSY
PREFILLED_SYRINGE | INTRAMUSCULAR | Status: AC
  Filled 2022-11-30: qty 10

## 2022-11-30 MED ORDER — PROPOFOL 10 MG/ML IV BOLUS
INTRAVENOUS | Status: AC
  Filled 2022-11-30: qty 20

## 2022-11-30 MED ORDER — BUPIVACAINE-EPINEPHRINE (PF) 0.25% -1:200000 IJ SOLN
INTRAMUSCULAR | Status: AC
  Filled 2022-11-30: qty 30

## 2022-11-30 MED ORDER — HYDROMORPHONE HCL 1 MG/ML IJ SOLN: 0.5000 mg | INTRAMUSCULAR | Status: DC | PRN

## 2022-11-30 MED ORDER — PANTOPRAZOLE SODIUM 40 MG PO TBEC
40.0000 mg | DELAYED_RELEASE_TABLET | Freq: Every day | ORAL | Status: DC
  Administered 2022-11-30 – 2022-12-02 (×3): 40 mg via ORAL
  Filled 2022-11-30 (×3): qty 1

## 2022-11-30 MED ORDER — METOPROLOL SUCCINATE ER 25 MG PO TB24
25.0000 mg | ORAL_TABLET | Freq: Every day | ORAL | Status: DC
  Administered 2022-12-01 – 2022-12-02 (×2): 25 mg via ORAL
  Filled 2022-11-30 (×2): qty 1

## 2022-11-30 MED ORDER — FUROSEMIDE 40 MG PO TABS
40.0000 mg | ORAL_TABLET | Freq: Every day | ORAL | Status: DC
  Administered 2022-11-30 – 2022-12-02 (×2): 40 mg via ORAL
  Filled 2022-11-30 (×3): qty 1

## 2022-11-30 MED ORDER — ROCURONIUM BROMIDE 10 MG/ML (PF) SYRINGE
PREFILLED_SYRINGE | INTRAVENOUS | Status: DC | PRN
  Administered 2022-11-30: 10 mg via INTRAVENOUS
  Administered 2022-11-30: 30 mg via INTRAVENOUS

## 2022-11-30 MED ORDER — POVIDONE-IODINE 10 % EX SWAB: 2.0000 | Freq: Once | CUTANEOUS | Status: DC

## 2022-11-30 MED ORDER — METHYLPREDNISOLONE ACETATE 40 MG/ML IJ SUSP
INTRAMUSCULAR | Status: AC
  Filled 2022-11-30: qty 1

## 2022-11-30 MED ORDER — ROCURONIUM BROMIDE 10 MG/ML (PF) SYRINGE
PREFILLED_SYRINGE | INTRAVENOUS | Status: AC
  Filled 2022-11-30: qty 10

## 2022-11-30 MED ORDER — VITAMIN B-12 1000 MCG PO TABS
1000.0000 ug | ORAL_TABLET | Freq: Every day | ORAL | Status: DC
  Administered 2022-11-30 – 2022-12-02 (×3): 1000 ug via ORAL
  Filled 2022-11-30 (×3): qty 1

## 2022-11-30 MED ORDER — ONDANSETRON HCL 4 MG PO TABS: 4.0000 mg | ORAL_TABLET | Freq: Four times a day (QID) | ORAL | Status: DC | PRN

## 2022-11-30 MED ORDER — SUCCINYLCHOLINE CHLORIDE 200 MG/10ML IV SOSY
PREFILLED_SYRINGE | INTRAVENOUS | Status: DC | PRN
  Administered 2022-11-30: 160 mg via INTRAVENOUS

## 2022-11-30 MED ORDER — BUPIVACAINE-EPINEPHRINE (PF) 0.25% -1:200000 IJ SOLN
INTRAMUSCULAR | Status: DC | PRN
  Administered 2022-11-30: 20 mL

## 2022-11-30 MED ORDER — SUFENTANIL CITRATE 50 MCG/ML IV SOLN
0.5000 ug/kg/h | INTRAVENOUS | Status: AC
  Administered 2022-11-30: .5 ug/kg/h via INTRAVENOUS
  Administered 2022-11-30: .25 ug/kg/h via INTRAVENOUS
  Filled 2022-11-30: qty 1

## 2022-11-30 MED ORDER — CEFAZOLIN SODIUM-DEXTROSE 2-4 GM/100ML-% IV SOLN
2.0000 g | Freq: Four times a day (QID) | INTRAVENOUS | Status: AC
  Administered 2022-11-30 – 2022-12-01 (×3): 2 g via INTRAVENOUS
  Filled 2022-11-30 (×2): qty 100

## 2022-11-30 MED ORDER — ATORVASTATIN CALCIUM 10 MG PO TABS
20.0000 mg | ORAL_TABLET | Freq: Every day | ORAL | Status: DC
  Administered 2022-11-30 – 2022-12-02 (×3): 20 mg via ORAL
  Filled 2022-11-30 (×3): qty 2

## 2022-11-30 MED ORDER — VANCOMYCIN HCL 1000 MG IV SOLR
INTRAVENOUS | Status: DC | PRN
  Administered 2022-11-30: 1000 mg

## 2022-11-30 MED ORDER — PROPOFOL 10 MG/ML IV BOLUS
INTRAVENOUS | Status: DC | PRN
  Administered 2022-11-30: 130 mg via INTRAVENOUS

## 2022-11-30 MED ORDER — PHENYLEPHRINE 80 MCG/ML (10ML) SYRINGE FOR IV PUSH (FOR BLOOD PRESSURE SUPPORT)
PREFILLED_SYRINGE | INTRAVENOUS | Status: DC | PRN
  Administered 2022-11-30 (×3): 80 ug via INTRAVENOUS

## 2022-11-30 MED ORDER — FENTANYL CITRATE (PF) 250 MCG/5ML IJ SOLN
INTRAMUSCULAR | Status: DC | PRN
  Administered 2022-11-30: 50 ug via INTRAVENOUS
  Administered 2022-11-30 (×2): 100 ug via INTRAVENOUS

## 2022-11-30 MED ORDER — VANCOMYCIN HCL 1000 MG IV SOLR
INTRAVENOUS | Status: AC
  Filled 2022-11-30: qty 20

## 2022-11-30 MED ORDER — CEFAZOLIN SODIUM-DEXTROSE 2-4 GM/100ML-% IV SOLN
2.0000 g | INTRAVENOUS | Status: AC
  Administered 2022-11-30 (×2): 2 g via INTRAVENOUS
  Filled 2022-11-30: qty 100

## 2022-11-30 MED ORDER — ONDANSETRON HCL 4 MG/2ML IJ SOLN
4.0000 mg | Freq: Four times a day (QID) | INTRAMUSCULAR | Status: DC | PRN
  Administered 2022-11-30 – 2022-12-02 (×3): 4 mg via INTRAVENOUS
  Filled 2022-11-30 (×3): qty 2

## 2022-11-30 MED ORDER — PHENYLEPHRINE HCL-NACL 20-0.9 MG/250ML-% IV SOLN
INTRAVENOUS | Status: DC | PRN
  Administered 2022-11-30: 50 ug/min via INTRAVENOUS

## 2022-11-30 MED ORDER — CHLORHEXIDINE GLUCONATE 0.12 % MT SOLN
15.0000 mL | Freq: Once | OROMUCOSAL | Status: AC
  Administered 2022-11-30: 15 mL via OROMUCOSAL
  Filled 2022-11-30: qty 15

## 2022-11-30 MED ORDER — THROMBIN 20000 UNITS EX SOLR
CUTANEOUS | Status: DC | PRN
  Administered 2022-11-30: 20 mL

## 2022-11-30 MED ORDER — ONDANSETRON HCL 4 MG/2ML IJ SOLN
INTRAMUSCULAR | Status: DC | PRN
  Administered 2022-11-30: 4 mg via INTRAVENOUS

## 2022-11-30 MED ORDER — SUFENTANIL CITRATE 250 MCG/5ML IV SOLN
0.5000 ug/kg/h | INTRAVENOUS | Status: DC
  Filled 2022-11-30: qty 5

## 2022-11-30 MED ORDER — FERROUS SULFATE 325 (65 FE) MG PO TABS
325.0000 mg | ORAL_TABLET | ORAL | Status: DC
  Administered 2022-11-30 – 2022-12-02 (×2): 325 mg via ORAL
  Filled 2022-11-30 (×2): qty 1

## 2022-11-30 MED ORDER — FENTANYL CITRATE (PF) 250 MCG/5ML IJ SOLN
INTRAMUSCULAR | Status: AC
  Filled 2022-11-30: qty 5

## 2022-11-30 MED ORDER — THROMBIN 20000 UNITS EX SOLR
CUTANEOUS | Status: AC
  Filled 2022-11-30: qty 20000

## 2022-11-30 MED ORDER — POLYETHYLENE GLYCOL 3350 17 G PO PACK
17.0000 g | PACK | Freq: Every day | ORAL | Status: DC
  Administered 2022-11-30 – 2022-12-02 (×2): 17 g via ORAL
  Filled 2022-11-30 (×3): qty 1

## 2022-11-30 MED ORDER — TRANEXAMIC ACID-NACL 1000-0.7 MG/100ML-% IV SOLN
1000.0000 mg | Freq: Once | INTRAVENOUS | Status: AC
  Administered 2022-11-30: 1000 mg via INTRAVENOUS
  Filled 2022-11-30: qty 100

## 2022-11-30 MED ORDER — ACETAMINOPHEN 500 MG PO TABS
1000.0000 mg | ORAL_TABLET | Freq: Once | ORAL | Status: AC
  Administered 2022-11-30: 1000 mg via ORAL
  Filled 2022-11-30: qty 2

## 2022-11-30 MED ORDER — TRANEXAMIC ACID-NACL 1000-0.7 MG/100ML-% IV SOLN
1000.0000 mg | INTRAVENOUS | Status: AC
  Administered 2022-11-30: 1000 mg via INTRAVENOUS
  Filled 2022-11-30: qty 100

## 2022-11-30 MED ORDER — LACTATED RINGERS IV SOLN: INTRAVENOUS | Status: DC

## 2022-11-30 MED ORDER — POTASSIUM 99 MG PO TABS: 99.0000 mg | ORAL_TABLET | Freq: Every day | ORAL | Status: DC | PRN

## 2022-11-30 MED ORDER — PROPOFOL 1000 MG/100ML IV EMUL
INTRAVENOUS | Status: AC
  Filled 2022-11-30: qty 100

## 2022-11-30 MED ORDER — ACETAMINOPHEN 500 MG PO TABS
1000.0000 mg | ORAL_TABLET | Freq: Three times a day (TID) | ORAL | Status: AC
  Administered 2022-11-30 – 2022-12-01 (×4): 1000 mg via ORAL
  Filled 2022-11-30 (×4): qty 2

## 2022-11-30 SURGICAL SUPPLY — 71 items
APL SKNCLS STERI-STRIP NONHPOA (GAUZE/BANDAGES/DRESSINGS) ×1
BENZOIN TINCTURE PRP APPL 2/3 (GAUZE/BANDAGES/DRESSINGS) IMPLANT
BONE CANC CHIPS 20CC PCAN1/4 (Bone Implant) ×1 IMPLANT
BONE FIBERS PLIAFX 10 (Bone Implant) ×1 IMPLANT
BUR MATCHSTICK NEURO 3.0 LAGG (BURR) IMPLANT
BUR NEURO DRILL SOFT 3.0X3.8M (BURR) ×2 IMPLANT
CANISTER SUCT 3000ML PPV (MISCELLANEOUS) ×2 IMPLANT
CHIPS CANC BONE 20CC PCAN1/4 (Bone Implant) ×1 IMPLANT
CLSR STERI-STRIP ANTIMIC 1/2X4 (GAUZE/BANDAGES/DRESSINGS) IMPLANT
CORD BIPOLAR FORCEPS 12FT (ELECTRODE) ×2 IMPLANT
COVER MAYO STAND STRL (DRAPES) ×2 IMPLANT
COVER SURGICAL LIGHT HANDLE (MISCELLANEOUS) ×2 IMPLANT
DRAPE C-ARM 42X72 X-RAY (DRAPES) ×2 IMPLANT
DRAPE MICROSCOPE LEICA 54X105 (DRAPES) ×2 IMPLANT
DRAPE UTILITY XL STRL (DRAPES) ×2 IMPLANT
DRSG MEPILEX POST OP 4X8 (GAUZE/BANDAGES/DRESSINGS) IMPLANT
DRSG TEGADERM 4X4.75 (GAUZE/BANDAGES/DRESSINGS) IMPLANT
DURAPREP 26ML APPLICATOR (WOUND CARE) ×2 IMPLANT
ELECT BLADE 4.0 EZ CLEAN MEGAD (MISCELLANEOUS) ×1
ELECT COATED BLADE 2.86 ST (ELECTRODE) IMPLANT
ELECT PENCIL ROCKER SW 15FT (MISCELLANEOUS) ×2 IMPLANT
ELECT REM PT RETURN 9FT ADLT (ELECTROSURGICAL) ×1
ELECTRODE BLDE 4.0 EZ CLN MEGD (MISCELLANEOUS) ×2 IMPLANT
ELECTRODE REM PT RTRN 9FT ADLT (ELECTROSURGICAL) ×2 IMPLANT
EVACUATOR 1/8 PVC DRAIN (DRAIN) IMPLANT
GAUZE SPONGE 4X4 12PLY STRL (GAUZE/BANDAGES/DRESSINGS) IMPLANT
GLOVE BIO SURGEON STRL SZ7.5 (GLOVE) ×2 IMPLANT
GLOVE INDICATOR 7.5 STRL GRN (GLOVE) ×2 IMPLANT
GOWN STRL REUS W/ TWL LRG LVL3 (GOWN DISPOSABLE) ×2 IMPLANT
GOWN STRL REUS W/TWL LRG LVL3 (GOWN DISPOSABLE) ×1
GOWN STRL SURGICAL XL XLNG (GOWN DISPOSABLE) ×2 IMPLANT
GRAFT BNE CANC CHIPS 1-8 20CC (Bone Implant) IMPLANT
GRAFT BNE FBR PLIAFX PRIME 10 (Bone Implant) IMPLANT
KIT BASIN OR (CUSTOM PROCEDURE TRAY) ×2 IMPLANT
KIT POSITION SURG JACKSON T1 (MISCELLANEOUS) ×2 IMPLANT
KIT TURNOVER KIT B (KITS) ×2 IMPLANT
MODULE EMG NDL SSEP NVM5 (NEEDLE) IMPLANT
MODULE EMG NEEDLE SSEP NVM5 (NEEDLE) ×1 IMPLANT
NDL 22X1.5 STRL (OR ONLY) (MISCELLANEOUS) ×2 IMPLANT
NDL MODULE NVMS MEP/EMG (NEEDLE) IMPLANT
NEEDLE 22X1.5 STRL (OR ONLY) (MISCELLANEOUS) ×1 IMPLANT
NEEDLE MODULE NVMS MEP/EMG (NEEDLE) ×1 IMPLANT
NS IRRIG 1000ML POUR BTL (IV SOLUTION) ×2 IMPLANT
PACK LAMINECTOMY ORTHO (CUSTOM PROCEDURE TRAY) ×2 IMPLANT
PATTIES SURGICAL .5 X.5 (GAUZE/BANDAGES/DRESSINGS) ×2 IMPLANT
PROBE BALL TIP NVM5 SNG USE (BALLOONS) IMPLANT
ROD RELINE-O 5.5X100 LORD (Rod) IMPLANT
SCREW LOCK RELINE 5.5 TULIP (Screw) IMPLANT
SCREW RELINE-O POLY 6.5X45 (Screw) IMPLANT
SCREW RELINE-O POLY 6.5X50MM (Screw) IMPLANT
SCREW RELINE-O POLY 7.5X50 (Screw) ×4 IMPLANT
SCREW RLINE PLY 2S 50X7.5XPA (Screw) IMPLANT
SPONGE DRAIN TRACH 4X4 STRL 2S (GAUZE/BANDAGES/DRESSINGS) IMPLANT
SPONGE SURGIFOAM ABS GEL 100 (HEMOSTASIS) ×2 IMPLANT
SPONGE T-LAP 4X18 ~~LOC~~+RFID (SPONGE) ×2 IMPLANT
SUCTION FRAZIER HANDLE 10FR (MISCELLANEOUS) ×1
SUCTION TUBE FRAZIER 10FR DISP (MISCELLANEOUS) ×2 IMPLANT
SUT BONE WAX W31G (SUTURE) ×2 IMPLANT
SUT ETHILON 2 0 PSLX (SUTURE) IMPLANT
SUT MNCRL+ AB 3-0 CT1 36 (SUTURE) ×2 IMPLANT
SUT MONOCRYL AB 3-0 CT1 36IN (SUTURE) ×1
SUT VIC AB 0 CT1 18XCR BRD 8 (SUTURE) IMPLANT
SUT VIC AB 0 CT1 18XCR BRD8 (SUTURE) ×2 IMPLANT
SUT VIC AB 0 CT1 8-18 (SUTURE) ×3
SUT VIC AB 2-0 CT1 18 (SUTURE) ×2 IMPLANT
SUT VIC AB 2-0 CT2 18 VCP726D (SUTURE) IMPLANT
SYR BULB IRRIG 60ML STRL (SYRINGE) ×2 IMPLANT
SYR CONTROL 10ML LL (SYRINGE) ×2 IMPLANT
TOWEL GREEN STERILE (TOWEL DISPOSABLE) ×2 IMPLANT
TOWEL GREEN STERILE FF (TOWEL DISPOSABLE) ×2 IMPLANT
WATER STERILE IRR 1000ML POUR (IV SOLUTION) ×2 IMPLANT

## 2022-11-30 NOTE — Transfer of Care (Signed)
Immediate Anesthesia Transfer of Care Note  Patient: DACOTAH CABELLO  Procedure(s) Performed: L3-4, L4-5 POSTERIOR INSTRUMENTED LUMBAR FUSION / EXPLORATION OF L2-3 FUSION L3-4, L4-5 LAMINECTOMY AND FORAMINOTOMY  Patient Location: PACU  Anesthesia Type:General  Level of Consciousness: drowsy and patient cooperative  Airway & Oxygen Therapy: Patient Spontanous Breathing  Post-op Assessment: Report given to RN and Post -op Vital signs reviewed and stable  Post vital signs: Reviewed and stable  Last Vitals:  Vitals Value Taken Time  BP 93/49 11/30/22 1432  Temp    Pulse 74 11/30/22 1434  Resp 9 11/30/22 1434  SpO2 100 % 11/30/22 1434  Vitals shown include unvalidated device data.  Last Pain:  Vitals:   11/30/22 0606  TempSrc:   PainSc: 0-No pain      Patients Stated Pain Goal: 0 (99/87/21 5872)  Complications: No notable events documented.

## 2022-11-30 NOTE — Anesthesia Procedure Notes (Signed)
Procedure Name: Intubation Date/Time: 11/30/2022 7:51 AM  Performed by: Lance Coon, CRNAPre-anesthesia Checklist: Patient identified, Emergency Drugs available, Suction available, Patient being monitored and Timeout performed Patient Re-evaluated:Patient Re-evaluated prior to induction Oxygen Delivery Method: Circle system utilized Preoxygenation: Pre-oxygenation with 100% oxygen Induction Type: IV induction Ventilation: Mask ventilation without difficulty and Oral airway inserted - appropriate to patient size Laryngoscope Size: Miller and 3 Grade View: Grade I Tube type: Oral Tube size: 7.5 mm Number of attempts: 1 Airway Equipment and Method: Stylet Placement Confirmation: ETT inserted through vocal cords under direct vision, positive ETCO2 and breath sounds checked- equal and bilateral Secured at: 21 cm Tube secured with: Tape Dental Injury: Teeth and Oropharynx as per pre-operative assessment

## 2022-11-30 NOTE — Anesthesia Postprocedure Evaluation (Signed)
Anesthesia Post Note  Patient: JABES PRIMO  Procedure(s) Performed: L3-4, L4-5 POSTERIOR INSTRUMENTED LUMBAR FUSION / EXPLORATION OF L2-3 FUSION L3-4, L4-5 LAMINECTOMY AND FORAMINOTOMY     Patient location during evaluation: PACU Anesthesia Type: General Level of consciousness: awake and alert Pain management: pain level controlled Vital Signs Assessment: post-procedure vital signs reviewed and stable Respiratory status: spontaneous breathing, nonlabored ventilation, respiratory function stable and patient connected to nasal cannula oxygen Cardiovascular status: blood pressure returned to baseline and stable Postop Assessment: no apparent nausea or vomiting Anesthetic complications: no   No notable events documented.  Last Vitals:  Vitals:   11/30/22 1545 11/30/22 1600  BP: 133/77 131/72  Pulse: 76 81  Resp: 10 (!) 22  Temp:  36.7 C  SpO2: 98% 94%    Last Pain:  Vitals:   11/30/22 1530  TempSrc:   PainSc: Leonore Yeilyn Gent

## 2022-11-30 NOTE — Progress Notes (Signed)
Orthopedic Surgery Post-operative Progress Note  Assessment: Patient is a 76 y.o. male who is currently admitted after undergoing L2-5 PSIF, L4-5 laminectomies and foraminotomies   Plan: -Operative plans complete -Needs upright films when able -Drains to be maintained until output slows -Out of bed as tolerated, no brace -No bending/lifting/twisting greater than 10 pounds -PT evaluate and treat -Pain control -Regular diet -No chemoprophylaxis for dvt or antiplatelets for 72 hours after surgery -Antibiotics x2 post-operative doses -Disposition: remain floor status  ___________________________________________________________________________   Subjective: No acute events since surgery. Has now made it to the floor from PACU. Having some soreness around his back. Having left buttock pain. No pain in right leg. No pain further than buttock on left leg. Has some paresthesias in the left calf. Sensation has improved in bilateral legs. No other paresthesias or numbness. Feels left leg is slightly stronger since surgery.   Objective:  General: no acute distress, appropriate affect Neurologic: alert, answering questions appropriately, following commands Respiratory: unlabored breathing on supplemental O2 Skin: dressing clear/dry/intact, drains with sanguinous output  MSK (spine):  -Strength exam      Right  Left  EHL    5/5  5/5 TA    5/5  5/5 GSC    5/5  5/5 Knee extension  5/5  5/5 Hip flexion   4/5  4/5  Hip flexion strength testing limited by back pain  -Sensory exam    Sensation intact to light touch in L3-S1 nerve distributions of bilateral lower extremities (decreased but better than pre-op in left L4, L5 distributions)   Patient name: Derek [name] Patient MRN: [id number] Date: 11/30/22

## 2022-11-30 NOTE — Op Note (Signed)
Orthopedic Spine Surgery Operative Report  Procedure: L3/4 and L4/5 lumbar laminectomies with partial facetectomies and foraminotomies Exploration of prior L2/3 fusion  L2/3, L3/4, L4/5 posterior lumbar spinal arthrodesis L2, L3, L4, L5 pedicle screw instrumentation with rods Application of locally harvested autograft (spinous processes, removed lamina) Application of allograft morselized bone and demineralized bone matrix  Modifier: none  Date of procedure: 11/30/2022  Patient name: Derek [name] MRN: [id number] DOB: October 19, 1946  Surgeon: Ileene Rubens, MD Assistant: None Pre-operative diagnosis: Lateral recess and foraminal stenosis at L3/4 and L4/5 Post-operative diagnosis: Lateral recess and foraminal stenosis at L3/4 and L4/5, pseudarthrosis at L2/3 Findings: L3/4 and L4/5 hypertrophic facets and thickened ligamentum flavum, pseudarthrosis at L2/3  Specimens: none Anesthesia: general EBL: [id number] Complications: none Pre-incision antibiotic: ancef  Implants:  Implant Name Type Inv. Item Serial No. Manufacturer Lot No. LRB No. Used Action  BONE CANC CHIPS 20CC PCAN1/4 - [id number] Bone Implant BONE [facility] CHIPS 20CC PCAN1/4 [id number] [place]  N/A 1 Implanted  BONE FIBERS PLIAFX 10 - [id number] Bone Implant BONE FIBERS PLIAFX 10 [id number] [employer]  N/A 1 Implanted  SCREW RELINE-O POLY 7.5X50 - [id number] Screw SCREW RELINE-O POLY 7.5X50  [employer]  N/A 4 Implanted  SCREW RELINE-O POLY 6.5X50MM - [id number] Screw SCREW RELINE-O POLY 6.5X50MM  [employer]  N/A 2 Implanted  SCREW RELINE-O POLY 6.5X45 - [id number] Screw SCREW RELINE-O POLY 6.5X45  [employer]  N/A 2 Implanted  SCREW LOCK RELINE 5.5 [place] - [id number] Screw SCREW LOCK RELINE 5.5 [place]  [employer]  N/A 8 Implanted  ROD RELINE-O 5.5X100 [place] - [id number] Rod ROD RELINE-O 5.5X100 LORD  [employer]  N/A 2 Implanted    Indication for procedure: Patient is a 76 y.o. male who presented to  the office with symptoms consistent with lumbar radiculopathy. The patient had tried conservative treatments that did not provide any lasting relief. As result, operative management was discussed. The pre-operative MRI showed lateral recess stenosis and foraminal stenosis at L3/4 and L4/5 so a L3-5 laminectomy with foraminotomies and arthrodesis from L3-5 was presented as a treatment option. The risks including but not limited to dural tear, nerve root injury, paralysis, persistent pain, pseudarthrosis, infection, bleeding, hardware failure, adjacent segment disease, heart attack, death, stroke, fracture, blindness, and need for additional procedures were discussed with the patient. The benefit of the surgery would be relief of the patient's leg pain. I did discuss that relief of back pain is not a goal of this surgery as it is not reliably relieved with this surgery. The alternatives to surgical management were covered with the patient and included continued monitoring, physical therapy, over-the-counter pain medications, ambulatory aids, pain management, and activity modification. All the patient's questions were answered to their satisfaction. After this discussion, the patient expressed understanding and elected to proceed with surgical intervention.    Procedure Description: The patient was met in the pre-operative holding area. The patient's identity and consent were verified. The operative site was marked. The patient's remaining questions about the surgery were answered. The patient was brought back to the operating room. General anesthesia was induced and an endotracheal tube was placed by the anesthesia staff. Neuromonitoring leads were hooked up to the patient by the technologist. The patient was transferred to the prone [place] table in the prone position. All bony prominences were well padded. The head of the bed was slightly elevated and the eyes were free from compression by the face pillow. An  Copy was used to  remove his hair over the lumbar region. The surgical area was cleansed with alcohol. Fluoroscopy was then brought in to check rotation on the AP image and to mark the levels on the lateral image. The patient's skin was then prepped and draped in a standard, sterile fashion. A time out was performed that identified the patient, the procedure, and the operative levels. All team members agreed with what was stated in the time out.   20cc of 0.25% Marcaine was injected into the muscle and soft tissue in the area of the planned incision. A midline incision over the spinous processes of the previously marked levels was made and sharp dissection was continued down through the skin and dermis. Electrocautery was then used to continue the midline dissection down to the level of the spinous process. Subperiosteal dissection was performed using electrocautery to expose the lamina of L3, L4, and the cranial aspect of L5. The facet capsules of L4/5 and L3/4 were exposed bilaterally with electrocautery. Electrocautery dissection was continued cranially to expose the rods and screws at L2 and L3.  Electrocautery was then used to dissect lateral the facets to find the transverse process at each level (L2, L3, L4, and L5). Electrocautery was used to clear the soft tissue off the transverse processes. There was no posterolateral fusion mass between the transverse processes.   The set caps were then removed off the L2 and L3 screws bilaterally. The rods were removed bilaterally. The pedicle screws were held with a kocher and manipulated. There was motion seen between the L2 and L3 segments. The L2 screws were then removed bilaterally. The L3 screws were removed bilaterally. The old screw holes were palpated with a feeler. There were no breaches. No CSF was seen coming from the old screw holes. These were cortical screws. In order to fit with the planned L4 and L5 pedicle screws, these screws were going  to be revised to pedicle screws.   Next, the pedicle screws were placed. The starting points for the pedicle screws were identified using anatomical landmarks. Fluoroscopy was brought in in the AP view to confirm the start points. Each pedicle screw was placed under AP fluoroscopy. A true AP was obtained of the vertebra where screw was being placed. Then, a pilot hole was made with a 41m matchstick burr at the start point. A pedicle finder was advanced into the pilot hole. AP fluoroscopy was used to confirm proper starting point. An image was taken when the pedicle finder had been advanced 159m 1558mand at 43m53mhe pedicle finder remained within the pedicle and did not breach medially at any of those markers. The pedicle finder was then advanced to 40mm58m then withdrawn. A feeler was placed into the hole and confirmed no pedicle wall breaches. It was advanced to the ventral cortex by palpation. The screw length was then estimated off of the depth of the feeler. The widths of the screws had been predetermined as they were measured pre-operatively on the patient's advanced imaging. That measured length and pre-determined width screw was then inserted under AP fluoroscopic guidance. The screw was advanced approximately 10mm,60mm, 5mthen 43mm. A87mch one of those intervals an image was taken to confirm that the screw was following the trajectory of the pedicle and had not breached medially. The screw was then advanced fully until there was good purchase. This process was repeated for every pedicle screw.     The screws that were inserted were [employer] Reline:  Left                  Right L2 6.5x45  6.5x45 L3 6.5x50  6.5x50 L4 7.5x50  7.5x50 L5 7.5x50  7.5x50   AP and lateral fluoroscopic images were then taken to visualize all the screws. The screws were in satisfactory position. Neuromonitoring was checked and there were no changes from baseline. The pedicle screws were stimulated and  had no response >20.   A rongeur was used to remove the spinous processes and interspinous ligaments between caudal aspect of the L3 pedicle to the cranial aspect of the L5 pedicle. The spinous processes, but not the interspinous ligaments or soft tissue, was saved and morselized on the back table. Bone wax was used to obtain hemostasis at the bleeding bony surfaces. A high-speed burr was used to thin the lamina from the cranial aspect of L3/4 facet to the cadual aspect of the L4/5 facet joint.. Above the level of the ligamentum, the lamina was thinned with the burr to the approximate level of the ligamentum. Care was taken to leave at least 86m of pars interarticularis on each side. 252mand 69m70merrison rongeurs were used to remove the remaining lamina and ligamentum overlying the thecal sac.   A woodsen was placed into the laminectomy site to palpate for any remaining areas of stenosis. Once it was confirmed with the woodsen that decompression had been completed from the medial pedicle wall to the contralateral medial pedicle wall and from the pedicle of the most caudal planned level to the pedicle of the most cranial planned level, the laminectomies were completed. Next, the L3 pedicle was palpated and the L3/4 foramen was found by following it out. A woodsen was then placed into the foramen and a 2mm65mrrison was used to remove bone in the foramen. Once bone overlying the woodsen was removed, the woodsen was used to palpated the foramen. There was no further compressive material felt to be present. This process was repeated for the other L3/4 foramen and the bilateral L4/5 foramina. Fluoroscopic images were taken in the AP view to show a woodsen exiting each foramen that was decompressed. Then, lateral fluoroscopic images were taken to demonstrate the cranial and caudal aspect of the laminectomy with a woodsen underneath the bone at the cranial and then at the caudal aspect for these two images. A 5.5mm 66m100mm 69mwas placed into the pedicle screws and set screws were tightened over the rod into each pedicle screw. The same process was repeated for the contralateral side with the same size rod. All the set screws were final tightened. AP and lateral fluoroscopic images were taken demonstrating satisfactory position of the instrumentation. The wound was copiously irrigated with sterile saline.   A high speed burr was used to decorticate the facet joints, pars interarticularis, and the transverse processes at the planned arthrodesis levels. The locally harvested and morselized autograft, 10cc of demineralized bone matrix, and 20cc of cancellous chips were placed over the decorticated bone. All bony fragments that inadvertently went into the laminectomy defect were removed. The decompressed area was again palpated with a woodsen which confirmed satisfactory decompression.   1g of vancomycin powder was placed into the wound. A medium hemovac drain was placed deep to the fascia. The fascia was reapproximated with 0 vicryl suture. A subcutaneous hemovac drain was placed above the fascia. The subcutaneous fat was reapproximated with 0 vicryl suture. The deep dermal layer was reapproximated with 2-0 viryl. The  skin as closed with a 3-0 running moncryl. All counts were correct at the end of the case. The incision was dressed with steri strips and benzoine. An island dressing was placed over the wound. The patient was transferred back to a bed and brought to the post-anesthesia care unit by anesthesia staff in stable condition.  Post-operative plan: The patient will recover in the post-anesthesia care unit and then go to the floor. The patient will receive two post-operative doses of the ancef. The patient will be out of bed as tolerated with no brace. The patient will work with physical therapy. The drains will likely be removed on the day of discharge but if there is significant output, they will be left in place and  removed in the office at a later date. The patient's disposition will be determined based off how he is doing on the floor post-operatively.       Ileene Rubens, MD Orthopedic Surgeon

## 2022-11-30 NOTE — H&P (Signed)
Orthopedic Spine Surgery H&P Note  Assessment: Patient is a 76 y.o. male with lumbar radiculopathy and lateral recess and foraminal stenosis at L3/4 and L4/5.    Plan: -Out of bed as tolerated, activity as tolerated, no brace -Covered the risks of surgery one more time with the patient and patient elected to proceed with planned surgery -Written consent verified -Hold anticoagulation in anticipation of surgery -Ancef on all to OR -NPO for procedure -Site marked -To OR when ready  ___________________________________________________________________________  Chief Complaint: low back pain that radiates into legs (L>R)  History: Patient is 76 y.o. male who has been previously seen in the office for lumbar radiculopathy. These symptoms failed to improve with conservative treatment so operative management was discussed at the last office visit. The patient presents today with no changes in his symptoms since the last office visit. See previous office note for further details.    Review of systems: General: denies fevers and chills, myalgias Neurologic: denies recent changes in vision, slurred speech Abdomen: denies nausea, vomiting, hematemesis Respiratory: denies cough, shortness of breath  Past medical history: CAD HTN Carotid artery stenosis PVD CKD   Allergies: NSAIDs, prednisone   Past surgical history:  CTR Elbow surgery Endarterectomy Joint replacement Hernia repair Femur fracture surgery Tosillectomy L2/3 laminectomy, discectomy, and fusion (October 2022)   Social history: Former smoker, denies current use of nicotine product (smoking, vaping, patches, smokeless) Alcohol use: denies Denies recreational drug use  Family history: -reviewed and not pertinent to lumbar radiculopathy   Physical Exam:  General: no acute distress, appears stated age Neurologic: alert, answering questions appropriately, following commands Cardiovascular: regular rate, no  cyanosis Respiratory: unlabored breathing on room air, symmetric chest rise Psychiatric: appropriate affect, normal cadence to speech   MSK (spine):  -Strength exam      Left  Right  EHL    4/5  5/5 TA    5/5  5/5 GSC    5/5  5/5 Knee extension  5/5  5/5 Knee flexion   5/5  5/5 Hip flexion   4+/5  5/5  -Sensory exam    Sensation intact to light touch in L3-S1 nerve distributions of bilateral lower extremities (decreased in L4, L5 distributions on the left)   Patient name: Derek [name] Patient MRN: [id number] Date: 11/30/22

## 2022-11-30 NOTE — Brief Op Note (Signed)
11/30/2022  2:24 PM  PATIENT:  Derek [name]  76 y.o. male  PRE-OPERATIVE DIAGNOSIS:  LUMBAR RADICULOPATHY  POST-OPERATIVE DIAGNOSIS:  LUMBAR RADICULOPATHY  PROCEDURE:  Procedure(s): L3-4, L4-5 POSTERIOR INSTRUMENTED LUMBAR FUSION / EXPLORATION OF L2-3 FUSION (N/A) L3-4, L4-5 LAMINECTOMY AND FORAMINOTOMY (N/A)  SURGEON:  Surgeon(s) and Role:    Callie Fielding, MD - Primary  PHYSICIAN ASSISTANT:   ASSISTANTS: none   ANESTHESIA:   general  EBL:  1200cc  BLOOD ADMINISTERED:none  DRAINS:  2 medium hemovac    LOCAL MEDICATIONS USED:  MARCAINE     SPECIMEN:  No Specimen  DISPOSITION OF SPECIMEN:  N/A  COUNTS:  YES  TOURNIQUET: No tourniquet  DICTATION: .Note written in EPIC  PLAN OF CARE: Admit to inpatient   PATIENT DISPOSITION:  PACU - hemodynamically stable.   Delay start of Pharmacological VTE agent (>24hrs) due to surgical blood loss or risk of bleeding: yes

## 2022-12-01 ENCOUNTER — Inpatient Hospital Stay (HOSPITAL_COMMUNITY): Payer: Medicare Other

## 2022-12-01 ENCOUNTER — Encounter (HOSPITAL_COMMUNITY): Payer: Self-pay | Admitting: Orthopedic Surgery

## 2022-12-01 LAB — GLUCOSE, CAPILLARY
Glucose-Capillary: 120 mg/dL — ABNORMAL HIGH (ref 70–99)
Glucose-Capillary: 131 mg/dL — ABNORMAL HIGH (ref 70–99)
Glucose-Capillary: 131 mg/dL — ABNORMAL HIGH (ref 70–99)

## 2022-12-01 LAB — CBC
HCT: 25.6 % — ABNORMAL LOW (ref 39.0–52.0)
Hemoglobin: 8.1 g/dL — ABNORMAL LOW (ref 13.0–17.0)
MCH: 27.3 pg (ref 26.0–34.0)
MCHC: 31.6 g/dL (ref 30.0–36.0)
MCV: 86.2 fL (ref 80.0–100.0)
Platelets: 123 10*3/uL — ABNORMAL LOW (ref 150–400)
RBC: 2.97 MIL/uL — ABNORMAL LOW (ref 4.22–5.81)
RDW: 16.8 % — ABNORMAL HIGH (ref 11.5–15.5)
WBC: 8.6 10*3/uL (ref 4.0–10.5)
nRBC: 0 % (ref 0.0–0.2)

## 2022-12-01 LAB — BASIC METABOLIC PANEL
Anion gap: 11 (ref 5–15)
BUN: 17 mg/dL (ref 8–23)
CO2: 20 mmol/L — ABNORMAL LOW (ref 22–32)
Calcium: 8.4 mg/dL — ABNORMAL LOW (ref 8.9–10.3)
Chloride: 111 mmol/L (ref 98–111)
Creatinine, Ser: 1.88 mg/dL — ABNORMAL HIGH (ref 0.61–1.24)
GFR, Estimated: 36 mL/min — ABNORMAL LOW (ref >60)
Glucose, Bld: 157 mg/dL — ABNORMAL HIGH (ref 70–99)
Potassium: 4 mmol/L (ref 3.5–5.1)
Sodium: 142 mmol/L (ref 135–145)

## 2022-12-01 MED ORDER — SODIUM CHLORIDE 0.45 % IV SOLN: INTRAVENOUS | Status: DC

## 2022-12-01 MED ORDER — CALCIUM CARBONATE 1250 (500 CA) MG PO TABS
1.0000 | ORAL_TABLET | Freq: Every day | ORAL | Status: DC
  Administered 2022-12-02: 1250 mg via ORAL
  Filled 2022-12-01: qty 1

## 2022-12-01 MED ORDER — INSULIN ASPART 100 UNIT/ML IJ SOLN: 0.0000 [IU] | Freq: Three times a day (TID) | INTRAMUSCULAR | Status: DC

## 2022-12-01 MED ORDER — CALCIUM CARBONATE 1250 (500 CA) MG PO TABS: 1.0000 | ORAL_TABLET | Freq: Two times a day (BID) | ORAL | Status: DC

## 2022-12-01 NOTE — Plan of Care (Signed)

## 2022-12-01 NOTE — Progress Notes (Addendum)
Orthopedic Surgery Post-operative Progress Note  Assessment: Patient is a 76 y.o. male who is currently admitted after undergoing L2-5 PSIF, L4-5 laminectomies and foraminotomies   Plan: -Operative plans complete -Needs upright films when able -Drains to be maintained until output slows -Out of bed as tolerated, no brace -No bending/lifting/twisting greater than 10 pounds -PT evaluate and treat -Pain control -Regular diet -No chemoprophylaxis for dvt or antiplatelets for 72 hours after surgery -Antibiotics x2 post-operative doses -Disposition: remain floor status  Hypocalcemia -oral supplementation today  CKD -Is at baseline creatinine -Will continue to monitor  Borderline diabetes -Blood sugar was elevated prior to surgery, but is now over 150 -Will start sliding scale insulin today -Changing diet to diabetic  Anemia from surgical blood loss -Hgb 8.1 -Will continue to monitor -Transfuse if under 7   ___________________________________________________________________________   Subjective: No acute events overnight. Did have some nausea and reports he vomited twice. Feeling better this morning. Having back pain. No leg pain this morning. Was able to get up and walk a few steps around the room with his walker this morning with me. No leg pain with walking. Reports sensation in legs is improved since surgery.   Objective:  General: no acute distress, appropriate affect Neurologic: alert, answering questions appropriately, following commands Respiratory: unlabored breathing on room air Skin: dressing clear/dry/intact, drains with sanguinous output  MSK (spine):  -Strength exam      Right  Left  EHL    5/5  5/5 TA    5/5  5/5 GSC    5/5  5/5 Knee extension  5/5  5/5 Hip flexion   5/5  5/5  -Sensory exam    Sensation intact to light touch in L3-S1 nerve distributions of bilateral lower extremities (decreased but better than pre-op in left L4, L5  distributions)   Patient name: Derek [name] Patient MRN: [id number] Date: 12/01/22

## 2022-12-01 NOTE — Discharge Instructions (Signed)
Orthopedic Surgery Discharge Instructions  Patient name: Derek [name] Procedure Performed: L2-5 posterior spinal fusion, L3-5 laminectomies Date of Surgery: 11/30/2022 Surgeon: Ileene Rubens, MD  Pre-operative Diagnosis: Lumbar stenosis Post-operative Diagnosis: Same as above  Discharge Date: 12/02/2022 Discharged to: home Discharge Condition: stable  Activity: You should refrain from bending, lifting, or twisting with objects greater than ten pounds until three months after surgery. You are encouraged to walk as much as desired. You can perform household activities such as cleaning dishes, doing laundry, vacuuming, etc. as long as the ten-pound restriction is followed. You do not need to wear a brace during the post-operative period.   Incision Care: Your incision site has a dressing over it. That dressing should remain in place and dry at all times for a total of one week after surgery. After one week, you can remove the dressing. Underneath the dressing, you will find pieces of tape. You should leave these pieces of tape in place. They will fall off with time. Do not pick, rub, or scrub at them. Do not put cream or lotion over the surgical area. After one week and once the dressing is off, it is okay to let soap and water run over your incision. Again, do not pick, scrub, or rub at the pieces of tape when bathing. You may want to place a dressing over your back after showering if you find your pants or belt rubbing over the incision. Do not submerge (e.g., take a bath, swim, go in a hot tub, etc.) until six weeks after surgery. There may be some bloody drainage from the incision into the dressing after surgery. This is normal. You do not need to replace the dressing. Continue to leave it in place for the one week as instructed above. Should the dressing become saturated with blood or drainage, please call the office for further instructions.   Medications: You have been prescribed oxycodone.  This is a narcotic pain medication and should only be taken as prescribed. You should not drink alcohol or operate heavy machinery (including driving) while taking this medication. The oxycodone can cause constipation as a side effect. For that reason, you have been prescribed senna. This is a laxative. You do not need to take this medication if you develop diarrhea. Should you remain constipated even while taking the senna, please use over-the-counter miralax as instructed on the packaging to promote regular bowel movements. Tylenol has been prescribed to be taken every 8 hours, which will give you additional pain relief. Robaxin is a muscle relaxer that has been prescribed to you for muscle spasm type pain. Take this medication as needed.   Do not take NSAIDs (ibuprofen, Aleve, Advil, Celebrex, naproxen, meloxicam, etc.) for the first 6 weeks after surgery as there is some evidence that their use may decrease the chances of successful fusion.  In order to set expectations for opioid prescriptions, you will only be prescribed opioids for a total of six weeks after surgery and, at two-weeks after surgery, your opioid prescription will start to tapered (decreased dosage and number of pills). If you have ongoing need for opioid medication six weeks after surgery, you will be referred to pain management. If you are already established with a provider that is giving you opioid medications, you should schedule an appointment with them for six weeks after surgery if you feel you are going to need another prescription. State law only allows for opioid prescriptions one week at a time. If you are running out  of opioid medication near the end of the week, please call the office during business hours before running out so I can send you another prescription.   You may resume any home blood thinners (warfarin, lovenox, aspirin, apixaban, plavix, xarelto, etc) 72 hours after your surgery. Take these medications as they  were previously prescribed.  Driving: You should not drive while taking narcotic pain medications. You should start getting back to driving slowly and you may want to try driving in a parking lot before doing anything more.   Diet: You are safe to resume your regular diet after surgery.   Reasons to Call the Office After Surgery: You should feel free to call the office with any concerns or questions you have in the post-operative period, but you should definitely notify the office if you develop: -shortness of breath, chest pain, or trouble breathing -excessive bleeding, drainage, redness, or swelling around the surgical site -fevers, chills, or pain that is getting worse with each passing day -persistent nausea or vomiting -new weakness in either leg, new or worsening numbness or tingling in either leg -numbness in the groin, bowel or bladder incontinence -other concerns about your surgery  Follow Up Appointments: You should have an office appointment scheduled for approximately two weeks after surgery. If you do not remember when this appointment is or do not already have it scheduled, please call the office to schedule.   Office Information:  -Dr. Ileene Rubens -Phone number: [phone] -Address: [address]       [place], [place] [address]

## 2022-12-01 NOTE — Evaluation (Signed)
Physical Therapy Evaluation Patient Details Name: Derek [name] MRN: [id number] DOB: [birth year] Today's Date: 12/01/2022  History of Present Illness  Assessment: Patient is a 76 y.o. male who is currently admitted after undergoing L2-5 PSIF, L4-5 laminectomies and foraminotomies.  Clinical Impression  Patient received in bed, he wants to order breakfast and get dressed. Patient has to go to xray this am. He is assisted by RN in ordering breakfast and I assisted to get his underwear on. Patient required mod assist for bed mobility and min a for transfers. He is able to ambulate ~30 feet in room to bathroom and back. Distance limited by xray arriving while patient in bathroom. He will continue to benefit from skilled PT while here to improve functional mobility and independence to return home.           Recommendations for follow up therapy are one component of a multi-disciplinary discharge planning process, led by the attending physician.  Recommendations may be updated based on patient status, additional functional criteria and insurance authorization.  Follow Up Recommendations Home health PT      Assistance Recommended at Discharge Frequent or constant Supervision/Assistance  Patient can return home with the following  A little help with walking and/or transfers;A little help with bathing/dressing/bathroom;Help with stairs or ramp for entrance;Assistance with cooking/housework;Assist for transportation    Equipment Recommendations Rolling walker (2 wheels)  Recommendations for Other Services       Functional Status Assessment Patient has had a recent decline in their functional status and demonstrates the ability to make significant improvements in function in a reasonable and predictable amount of time.     Precautions / Restrictions Precautions Precautions: Fall;Back Precaution Booklet Issued: No Restrictions Weight Bearing Restrictions: No Other Position/Activity  Restrictions: No BLT, WBAT, no brace needed , 2 drains     Mobility  Bed Mobility Overal bed mobility: Needs Assistance Bed Mobility: Supine to Sit, Sit to Supine     Supine to sit: Mod assist, HOB elevated Sit to supine: Mod assist, HOB elevated   General bed mobility comments: patient states " I can't roll." when instructed. We instead raised HOB and  patient was assisted to bring legs around on and off bed.    Transfers Overall transfer level: Needs assistance Equipment used: Rolling walker (2 wheels) Transfers: Sit to/from Stand Sit to Stand: Min assist           General transfer comment: cues for hand placement. Increased time and effort needed due to pain and stiffness.    Ambulation/Gait Ambulation/Gait assistance: Min guard Gait Distance (Feet): 30 Feet Assistive device: Rolling walker (2 wheels) Gait Pattern/deviations: Step-through pattern, Decreased step length - right, Decreased step length - left, Decreased stride length Gait velocity: decr     General Gait Details: patient able to ambulate to bathroom and then x-ray came to get patient so he then ambulated back to bed.  Stairs            Wheelchair Mobility    Modified Rankin (Stroke Patients Only)       Balance Overall balance assessment: Needs assistance Sitting-balance support: Feet supported Sitting balance-Leahy Scale: Good     Standing balance support: Bilateral upper extremity supported, During functional activity, Reliant on assistive device for balance Standing balance-Leahy Scale: Fair                               Pertinent Vitals/Pain Pain Assessment Pain  Assessment: Faces Faces Pain Scale: Hurts little more Pain Location: back Pain Descriptors / Indicators: Tightness, Discomfort, Grimacing, Guarding Pain Intervention(s): Monitored during session, Repositioned    Home Living Family/patient expects to be discharged to:: Private residence Living  Arrangements: Spouse/significant other Available Help at Discharge: Family;Available 24 hours/day Type of Home: House Home Access: Stairs to enter Entrance Stairs-Rails: None Entrance Stairs-Number of Steps: 1 and 1   Home Layout: Able to live on main level with bedroom/bathroom        Prior Function Prior Level of Function : Needs assist             Mobility Comments: Using RW ADLs Comments: Wife able to assist as needed     Hand Dominance   Dominant Hand: Right    Extremity/Trunk Assessment   Upper Extremity Assessment Upper Extremity Assessment: Defer to OT evaluation    Lower Extremity Assessment Lower Extremity Assessment: Generalized weakness    Cervical / Trunk Assessment Cervical / Trunk Assessment: Back Surgery  Communication   Communication: HOH  Cognition Arousal/Alertness: Awake/alert Behavior During Therapy: WFL for tasks assessed/performed Overall Cognitive Status: Within Functional Limits for tasks assessed                                          General Comments      Exercises     Assessment/Plan    PT Assessment Patient needs continued PT services  PT Problem List Decreased strength;Decreased mobility;Decreased activity tolerance;Decreased balance;Pain;Decreased skin integrity;Decreased knowledge of precautions;Decreased range of motion       PT Treatment Interventions DME instruction;Therapeutic activities;Gait training;Therapeutic exercise;Stair training;Functional mobility training;Balance training;Patient/family education    PT Goals (Current goals can be found in the Care Plan section)  Acute Rehab PT Goals Patient Stated Goal: to go home tomorrow PT Goal Formulation: With patient Time For Goal Achievement: 12/08/22 Potential to Achieve Goals: Good    Frequency Min 4X/week     Co-evaluation               AM-PAC PT "6 Clicks" Mobility  Outcome Measure Help needed turning from your back to your  side while in a flat bed without using bedrails?: A Lot Help needed moving from lying on your back to sitting on the side of a flat bed without using bedrails?: A Lot Help needed moving to and from a bed to a chair (including a wheelchair)?: A Little Help needed standing up from a chair using your arms (e.g., wheelchair or bedside chair)?: A Little Help needed to walk in hospital room?: A Little Help needed climbing 3-5 steps with a railing? : A Little 6 Click Score: 16    End of Session Equipment Utilized During Treatment: Gait belt Activity Tolerance: Patient tolerated treatment well Patient left: in bed;Other (comment) (transport in room) Nurse Communication: Mobility status PT Visit Diagnosis: Other abnormalities of gait and mobility (R26.89);Muscle weakness (generalized) (M62.81);Pain;Difficulty in walking, not elsewhere classified (R26.2) Pain - part of body:  (back)    Time: 5465-0354 PT Time Calculation (min) (ACUTE ONLY): 21 min   Charges:   PT Evaluation $PT Eval Moderate Complexity: 1 Mod PT Treatments $Gait Training: 8-22 mins        Adelynne Joerger, PT, GCS 12/01/22,9:40 AM

## 2022-12-01 NOTE — Progress Notes (Signed)
Orthopedic Surgery Post-operative Progress Note  Assessment: Patient is a 76 y.o. male who is currently admitted after undergoing L2-5 PSIF, L4-5 laminectomies and foraminotomies   Plan: -Operative plans complete -Drains to be maintained until output slows -Out of bed as tolerated, no brace -No bending/lifting/twisting greater than 10 pounds -PT evaluate and treat -Pain control -Regular diet -No chemoprophylaxis for dvt or antiplatelets for 72 hours after surgery -Antibiotics x2 post-operative doses -Disposition: remain floor status  Hypocalcemia -oral supplementation today  CKD -Is at baseline creatinine -Will continue to monitor  Borderline diabetes -Blood sugar was elevated prior to surgery, but is now over 150 -Will start sliding scale insulin today -Changing diet to diabetic  Anemia from surgical blood loss -Hgb 8.1 -Will continue to monitor -Transfuse if under 7   ___________________________________________________________________________   Subjective: No acute events since seen this morning. Was able to walk the halls. Did not have any leg pain when walking the halls. No leg pain this evening. Only reporting back pain. Pain medications are helping. Has voided spontaneously. Nausea and emesis have resolved. Denies paresthesias and numbness.   Objective:  General: no acute distress, appropriate affect Neurologic: alert, answering questions appropriately, following commands Respiratory: unlabored breathing on room air Skin: dressing clear/dry/intact, drains with sanguinous output  MSK (spine):  -Strength exam      Right  Left  EHL    5/5  5/5 TA    5/5  5/5 GSC    5/5  5/5 Knee extension  5/5  5/5 Hip flexion   5/5  5/5  -Sensory exam    Sensation intact to light touch in L3-S1 nerve distributions of bilateral lower extremities (decreased but better than pre-op in left L4, L5 distributions)   Patient name: Derek [name] Patient MRN:  [id number] Date: 12/01/22

## 2022-12-01 NOTE — Evaluation (Signed)
Occupational Therapy Evaluation Patient Details Name: Derek [name] MRN: [id number] DOB: 09-14-1946 Today's Date: 12/01/2022   History of Present Illness Assessment: Patient is a 76 y.o. male who is currently admitted after undergoing L2-5 PSIF, L4-5 laminectomies and foraminotomies.   Clinical Impression   Patient admitted for the procedure above.  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  Post op discomfort is the primary deficit.  Currently he is needing up to Mod A for lower body ADL, and Min Guard for mobility at [place] level.  Patient is expecting to return home with his spouse, who can provide assist as needed for ADL and iADL.  Patient had a similar surgery a year prior, and has reasonable expectations for post acute rehab.  OT will follow in the acute setting to maximize functional status, and assist to ensure a safe discharge home.  Precaution sheet issued and reviewed.        Recommendations for follow up therapy are one component of a multi-disciplinary discharge planning process, led by the attending physician.  Recommendations may be updated based on patient status, additional functional criteria and insurance authorization.   Follow Up Recommendations  No OT follow up     Assistance Recommended at Discharge Intermittent Supervision/Assistance  Patient can return home with the following Assist for transportation;A little help with bathing/dressing/bathroom;Assistance with cooking/housework    Functional Status Assessment  Patient has had a recent decline in their functional status and demonstrates the ability to make significant improvements in function in a reasonable and predictable amount of time.  Equipment Recommendations  None recommended by OT    Recommendations for Other Services       Precautions / Restrictions Precautions Precautions: Fall;Back Precaution  Booklet Issued: Yes (comment) Precaution Comments: 2 drains Restrictions Weight Bearing Restrictions: No      Mobility Bed Mobility Overal bed mobility: Needs Assistance Bed Mobility: Sidelying to Sit, Sit to Sidelying   Sidelying to sit: Min assist     Sit to sidelying: Min assist      Transfers Overall transfer level: Needs assistance Equipment used: Rolling walker (2 wheels) Transfers: Sit to/from Stand Sit to Stand: Min guard                  Balance Overall balance assessment: Needs assistance Sitting-balance support: Feet supported Sitting balance-Leahy Scale: Good     Standing balance support: Reliant on assistive device for balance Standing balance-Leahy Scale: Fair                             ADL either performed or assessed with clinical judgement   ADL       Grooming: Supervision/safety;Standing           Upper Body Dressing : Minimal assistance;Standing   Lower Body Dressing: Moderate assistance;Sit to/from stand   Toilet Transfer: Min guard;Rolling walker (2 wheels);Regular Toilet;Ambulation                   Vision Patient Visual Report: No change from baseline       Perception     Praxis      Pertinent Vitals/Pain Pain Assessment Pain Assessment: Faces Faces Pain Scale: Hurts little more Pain Location: back Pain Descriptors / Indicators: Guarding, Grimacing Pain Intervention(s): Monitored during session     Hand Dominance Right   Extremity/Trunk Assessment  Upper Extremity Assessment Upper Extremity Assessment: Overall WFL for tasks assessed   Lower Extremity Assessment Lower Extremity Assessment: Defer to PT evaluation   Cervical / Trunk Assessment Cervical / Trunk Assessment: Back Surgery   Communication Communication Communication: HOH   Cognition Arousal/Alertness: Awake/alert Behavior During Therapy: WFL for tasks assessed/performed Overall Cognitive Status: Within Functional Limits for  tasks assessed                                       General Comments   VSS on RA    Exercises     Shoulder Instructions      Home Living Family/patient expects to be discharged to:: Private residence Living Arrangements: Spouse/significant other Available Help at Discharge: Family;Available 24 hours/day Type of Home: House Home Access: Stairs to enter [employer] of Steps: 1 and 1 Entrance Stairs-Rails: None Home Layout: Able to live on main level with bedroom/bathroom     Bathroom Shower/Tub: Walk-in shower;Door   [employer] Toilet: Handicapped height Bathroom Accessibility: Yes How Accessible: Accessible via walker Home Equipment: Shower seat;Grab bars - tub/shower;Cane - single [employer]ista (2 wheels)          Prior Functioning/Environment               Mobility Comments: Using RW recently due to back pain, had been using a SPC ADLs Comments: Wife able to assist as needed.  Spouse stated she assisted him for the first month last back surgery with lower body dressing.        OT Problem List: Pain;Impaired balance (sitting and/or standing)      OT Treatment/Interventions: Self-care/ADL training;Therapeutic activities;Patient/family education;Balance training    OT Goals(Current goals can be found in the care plan section) Acute Rehab OT Goals Patient Stated Goal: Hoping to go home tomorrow OT Goal Formulation: With patient Time For Goal Achievement: 12/15/22 Potential to Achieve Goals: Good ADL Goals Pt Will Perform Grooming: with modified independence;standing Pt Will Perform Lower Body Dressing: with modified independence;sit to/from stand Pt Will Transfer to Toilet: with modified independence;ambulating;regular height toilet  OT Frequency: Min 2X/week    Co-evaluation              AM-PAC OT "6 Clicks" Daily Activity     Outcome Measure Help from another person eating meals?: None Help from another person  taking care of personal grooming?: None Help from another person toileting, which includes using toliet, bedpan, or urinal?: A Little Help from another person bathing (including washing, rinsing, drying)?: A Lot Help from another person to put on and taking off regular upper body clothing?: A Little Help from another person to put on and taking off regular lower body clothing?: A Lot 6 Click Score: 18   End of Session Equipment Utilized During Treatment: Rolling walker (2 wheels) Nurse Communication: Mobility status  Activity Tolerance: Patient tolerated treatment well Patient left: in bed;with call bell/phone within reach;with family/visitor present  OT Visit Diagnosis: Unsteadiness on feet (R26.81)                Time: 6270-3500 OT Time Calculation (min): 14 min Charges:  OT General Charges $OT Visit: 1 Visit OT Evaluation $OT Eval Moderate Complexity: 1 Mod  12/01/2022  RP, OTR/L  Acute Rehabilitation Services  Office:  [phone]   Metta Clines 12/01/2022, 3:21 PM

## 2022-12-02 LAB — BASIC METABOLIC PANEL
Anion gap: 5 (ref 5–15)
BUN: 23 mg/dL (ref 8–23)
CO2: 25 mmol/L (ref 22–32)
Calcium: 8.1 mg/dL — ABNORMAL LOW (ref 8.9–10.3)
Chloride: 109 mmol/L (ref 98–111)
Creatinine, Ser: 2.07 mg/dL — ABNORMAL HIGH (ref 0.61–1.24)
GFR, Estimated: 32 mL/min — ABNORMAL LOW (ref >60)
Glucose, Bld: 121 mg/dL — ABNORMAL HIGH (ref 70–99)
Potassium: 3.3 mmol/L — ABNORMAL LOW (ref 3.5–5.1)
Sodium: 139 mmol/L (ref 135–145)

## 2022-12-02 LAB — POCT I-STAT EG7
Acid-base deficit: 3 mmol/L — ABNORMAL HIGH (ref 0.0–2.0)
Bicarbonate: 23 mmol/L (ref 20.0–28.0)
Calcium, Ion: 1.24 mmol/L (ref 1.15–1.40)
HCT: 27 % — ABNORMAL LOW (ref 39.0–52.0)
Hemoglobin: 9.2 g/dL — ABNORMAL LOW (ref 13.0–17.0)
O2 Saturation: 94 %
Potassium: 3.5 mmol/L (ref 3.5–5.1)
Sodium: 146 mmol/L — ABNORMAL HIGH (ref 135–145)
TCO2: 24 mmol/L (ref 22–32)
pCO2, Ven: 45.2 mmHg (ref 44–60)
pH, Ven: 7.316 (ref 7.25–7.43)
pO2, Ven: 79 mmHg — ABNORMAL HIGH (ref 32–45)

## 2022-12-02 LAB — CBC
HCT: 22.8 % — ABNORMAL LOW (ref 39.0–52.0)
HCT: 27 % — ABNORMAL LOW (ref 39.0–52.0)
Hemoglobin: 7.3 g/dL — ABNORMAL LOW (ref 13.0–17.0)
Hemoglobin: 8.4 g/dL — ABNORMAL LOW (ref 13.0–17.0)
MCH: 27.5 pg (ref 26.0–34.0)
MCH: 25.8 pg — ABNORMAL LOW (ref 26.0–34.0)
MCHC: 32 g/dL (ref 30.0–36.0)
MCHC: 31.1 g/dL (ref 30.0–36.0)
MCV: 86 fL (ref 80.0–100.0)
MCV: 82.8 fL (ref 80.0–100.0)
Platelets: 79 10*3/uL — ABNORMAL LOW (ref 150–400)
Platelets: 94 10*3/uL — ABNORMAL LOW (ref 150–400)
RBC: 2.65 MIL/uL — ABNORMAL LOW (ref 4.22–5.81)
RBC: 3.26 MIL/uL — ABNORMAL LOW (ref 4.22–5.81)
RDW: 17 % — ABNORMAL HIGH (ref 11.5–15.5)
RDW: 22 % — ABNORMAL HIGH (ref 11.5–15.5)
WBC: 5.6 10*3/uL (ref 4.0–10.5)
WBC: 6.9 10*3/uL (ref 4.0–10.5)
nRBC: 0 % (ref 0.0–0.2)
nRBC: 0 % (ref 0.0–0.2)

## 2022-12-02 LAB — PREPARE RBC (CROSSMATCH)

## 2022-12-02 MED ORDER — SENNA 8.6 MG PO TABS: 1.0000 | ORAL_TABLET | Freq: Two times a day (BID) | ORAL | 0 refills | Status: AC

## 2022-12-02 MED ORDER — METHOCARBAMOL 750 MG PO TABS: 750.0000 mg | ORAL_TABLET | Freq: Four times a day (QID) | ORAL | 0 refills | Status: AC

## 2022-12-02 MED ORDER — MAGNESIUM HYDROXIDE 400 MG/5ML PO SUSP
5.0000 mL | Freq: Once | ORAL | Status: DC
  Filled 2022-12-02: qty 30

## 2022-12-02 MED ORDER — SODIUM CHLORIDE 0.9% IV SOLUTION: Freq: Once | INTRAVENOUS | Status: AC

## 2022-12-02 MED ORDER — OXYCODONE HCL 5 MG PO TABS: 5.0000 mg | ORAL_TABLET | ORAL | 0 refills | Status: AC | PRN

## 2022-12-02 MED ORDER — POTASSIUM CHLORIDE 20 MEQ PO PACK
40.0000 meq | PACK | Freq: Two times a day (BID) | ORAL | Status: DC
  Administered 2022-12-02: 40 meq via ORAL
  Filled 2022-12-02: qty 2

## 2022-12-02 MED ORDER — CALCIUM CARBONATE 1250 (500 CA) MG PO TABS
1.0000 | ORAL_TABLET | Freq: Two times a day (BID) | ORAL | Status: DC
  Administered 2022-12-02: 1250 mg via ORAL
  Filled 2022-12-02: qty 1

## 2022-12-02 MED ORDER — ACETAMINOPHEN 500 MG PO TABS: 1000.0000 mg | ORAL_TABLET | Freq: Three times a day (TID) | ORAL | 0 refills | Status: AC

## 2022-12-02 MED ORDER — CALCIUM CARBONATE 1250 (500 CA) MG PO TABS: 1.0000 | ORAL_TABLET | Freq: Every day | ORAL | 0 refills | Status: DC

## 2022-12-02 MED ORDER — POTASSIUM CHLORIDE 20 MEQ PO PACK: 20.0000 meq | PACK | Freq: Two times a day (BID) | ORAL | Status: DC

## 2022-12-02 NOTE — Progress Notes (Signed)
CSW spoke with Brenton Grills of [employer] to order a walker.  Madilyn Fireman, MSW, LCSW Transitions of Care  Clinical Social Worker II [phone]

## 2022-12-02 NOTE — Plan of Care (Signed)
Orthopedic Plan of Care Note  Went to the patient's room around noon today.  His wife was at bedside.  Both the patient and his wife are hoping he can go home today.  He is completing his transfusion at this time.  He has no feelings of lightheadedness, fatigue, malaise.  He is not having any leg pain.  His drains were removed at bedside and new gauze/Tegaderm placed over the drain sites in anticipation of discharge this afternoon.  He then proceeded to work with physical therapy and walked the halls.  He was okay with me talking about his wife about discharge and things to follow-up on.  I discussed with his wife the medications that I am prescribing discharge.  I instructed her not to let him take the Valium with the oxycodone.  He is okay to take the Valium once he is done with the oxycodone.  I informed her of the findings on the postoperative x-rays that showed calcification of the aorta to extent that the radiologist recommended nonemergent ultrasound or CT.  I instructed her to bring this up with his primary care provider to get that study done and follow-up on it.  I told her that these instructions were also included in the discharge instructions.  I also covered with her concerning findings that should prompt urgent call to my office or return to the ER.  The symptoms included but were not limited to new leg weakness, numbness, paresthesias, bowel/bladder incontinence, groin numbness.  I also explained if he develops lightheadedness, pallor, general fatigue, malaise, feeling generally weak that this may be a symptom of anemia and should prompt evaluation.  She expressed understanding of the concerning findings, things to follow-up on, and the discharge medications.  I told her that we are going to check 1 more CBC and assuming he appropriately responded to the transfusion, will plan to discharge this afternoon.  Callie Fielding, MD Orthopedic Surgeon

## 2022-12-02 NOTE — Progress Notes (Signed)
Attempted to assessed any barriers to discharge. Was left on hold without a response and no answer when attempted to call again.  Gomez Cleverly SWOT RN

## 2022-12-02 NOTE — Discharge Summary (Signed)
Orthopedic Surgery Discharge Summary  Patient name: Derek [name] Patient MRN: [id number] Date: 12/02/22  Attending physician: Ileene Rubens, MD Final diagnosis: Lumbar stenosis, lumbar radiculopathy Findings: L3/4 and L4/5 hypertrophic facets and thickened ligamentum flavum, pseudarthrosis at L2/3   Hospital course: Patient is a 76 y.o. male who was admitted after undergoing: L3/4 and L4/5 lumbar laminectomies with partial facetectomies and foraminotomies Exploration of prior L2/3 fusion  L2/3, L3/4, L4/5 posterior lumbar spinal arthrodesis L2, L3, L4, L5 pedicle screw instrumentation with rods Application of locally harvested autograft (spinous processes, removed lamina) Application of allograft morselized bone and demineralized bone matrix The patient had significant pain immediately after surgery, but pain eventually was controlled with a multimodal regimen including oxycodone. Labs during the hospitalization revealed hypokalemia and hypocalcemia which were treated with oral supplementation. The patient worked with physical therapy who recommended discharge to home. The patient was tolerating an oral diet without issue and was voiding spontaneously after surgery. The patient was anemic due to blood loss after surgery. His hemoglobin got to 7.3. He received 1U of pRBCs given his history of CAD and due to anticipated continued downtrending. He responded appropriately to the transfusion. The patient's vitals were stable on the day of discharge. The patient's drains were removed on the day of discharge. The patient was medically ready for discharge and was discharge to home on post-operative day 2.  Instructions:   Orthopedic Surgery Discharge Instructions  Patient name: Derek [name] Procedure Performed: L2-5 posterior spinal fusion, L3-5 laminectomies Date of Surgery: 11/30/2022 Surgeon: Ileene Rubens, MD  Pre-operative Diagnosis: Lumbar stenosis Post-operative Diagnosis: Same as  above  Discharge Date: 12/02/2022 Discharged to: home Discharge Condition: stable  Activity: You should refrain from bending, lifting, or twisting with objects greater than ten pounds until three months after surgery. You are encouraged to walk as much as desired. You can perform household activities such as cleaning dishes, doing laundry, vacuuming, etc. as long as the ten-pound restriction is followed. You do not need to wear a brace during the post-operative period.   Incision Care: Your incision site has a dressing over it. That dressing should remain in place and dry at all times for a total of one week after surgery. After one week, you can remove the dressing. Underneath the dressing, you will find pieces of tape. You should leave these pieces of tape in place. They will fall off with time. Do not pick, rub, or scrub at them. Do not put cream or lotion over the surgical area. After one week and once the dressing is off, it is okay to let soap and water run over your incision. Again, do not pick, scrub, or rub at the pieces of tape when bathing. You may want to place a dressing over your back after showering if you find your pants or belt rubbing over the incision. Do not submerge (e.g., take a bath, swim, go in a hot tub, etc.) until six weeks after surgery. There may be some bloody drainage from the incision into the dressing after surgery. This is normal. You do not need to replace the dressing. Continue to leave it in place for the one week as instructed above. Should the dressing become saturated with blood or drainage, please call the office for further instructions.   Medications: You have been prescribed oxycodone. This is a narcotic pain medication and should only be taken as prescribed. You should not drink alcohol or operate heavy machinery (including driving) while taking this medication. Do  not take your home valium (diazepam) will using the oxycodone. You are safe to resume your valium  when you are done taking the oxycodone. The oxycodone can cause constipation as a side effect. For that reason, you have been prescribed senna. This is a laxative. You do not need to take this medication if you develop diarrhea. Should you remain constipated even while taking the senna, please use over-the-counter miralax as instructed on the packaging to promote regular bowel movements. Tylenol has been prescribed to be taken every 8 hours, which will give you additional pain relief. Robaxin is a muscle relaxer that has been prescribed to you for muscle spasm type pain. Take this medication as needed. You calcium was low during your hospitalization so calcium has been prescribed to you to supplement your calcium level.   Do not take NSAIDs (ibuprofen, Aleve, Advil, Celebrex, naproxen, meloxicam, etc.) for the first 6 weeks after surgery as there is some evidence that their use may decrease the chances of successful fusion.  In order to set expectations for opioid prescriptions, you will only be prescribed opioids for a total of six weeks after surgery and, at two-weeks after surgery, your opioid prescription will start to tapered (decreased dosage and number of pills). If you have ongoing need for opioid medication six weeks after surgery, you will be referred to pain management. If you are already established with a provider that is giving you opioid medications, you should schedule an appointment with them for six weeks after surgery if you feel you are going to need another prescription. State law only allows for opioid prescriptions one week at a time. If you are running out of opioid medication near the end of the week, please call the office during business hours before running out so I can send you another prescription.   You may resume any home blood thinners (warfarin, lovenox, aspirin, apixaban, plavix, xarelto, etc) 72 hours after your surgery. Take these medications as they were previously  prescribed.  Driving: You should not drive while taking narcotic pain medications. You should start getting back to driving slowly and you may want to try driving in a parking lot before doing anything more.   Diet: You are safe to resume your regular diet after surgery.   New findings: On your post-operative XR, the radiologist noted that there was calcification around your aorta. It is recommended that you get an abdominal aorta ultrasound. This should be done on an outpatient basis. You should talk to your primary care provider about getting this study done.   Reasons to Call the Office After Surgery: You should feel free to call the office with any concerns or questions you have in the post-operative period, but you should definitely notify the office if you develop: -shortness of breath, chest pain, or trouble breathing -excessive bleeding, drainage, redness, or swelling around the surgical site -fevers, chills, or pain that is getting worse with each passing day -lightheadedness, confusion, fatigue, pale skin color -persistent nausea or vomiting -new weakness in either leg, new or worsening numbness or tingling in either leg -numbness in the groin, bowel or bladder incontinence -other concerns about your surgery  Follow Up Appointments: You should have an office appointment scheduled for approximately two weeks after surgery. If you do not remember when this appointment is or do not already have it scheduled, please call the office to schedule.   Office Information:  -Dr. Ileene Rubens -Phone number: [phone] -Address: [address]  [place], [place] [address]

## 2022-12-02 NOTE — Progress Notes (Signed)
PT Cancellation Note  Patient Details Name: Derek [name] MRN: [id number] DOB: 1946/05/09   Cancelled Treatment:    Reason Eval/Treat Not Completed: Patient at procedure or test/unavailable (pt getting IV placed for prbc)   Etienne Millward B Zur[place] Carreno 12/02/2022, 9:49 AM [place] Office: [phone]

## 2022-12-02 NOTE — Progress Notes (Signed)
Physical Therapy Treatment Patient Details Name: Derek [name] MRN: [id number] DOB: [birth year] Today's Date: 12/02/2022   History of Present Illness 76 y.o. male admitted 12/19 for L2-5 PSIF, L4-5 laminectomies and foraminotomies. PMhx: HLD, CAD, HTN, CKD, PVD, lumbar stenosis    PT Comments    Pt with significantly improved gait able to walk long hall distance and perform stairs with RW. Pt educated for all back precautions with pt needing cues to maintain and wife reviewing handout. Pt with continued assist for transfers and mobility with wife available at D/C. Will continue to follow with pt appropriate for D/C.    Recommendations for follow up therapy are one component of a multi-disciplinary discharge planning process, led by the attending physician.  Recommendations may be updated based on patient status, additional functional criteria and insurance authorization.  Follow Up Recommendations  Home health PT     Assistance Recommended at Discharge Intermittent Supervision/Assistance  Patient can return home with the following A little help with walking and/or transfers;A little help with bathing/dressing/bathroom;Help with stairs or ramp for entrance;Assistance with cooking/housework;Assist for transportation   Equipment Recommendations  Rolling walker (2 wheels)    Recommendations for Other Services       Precautions / Restrictions Precautions Precautions: Fall;Back Precaution Comments: 2 hemovacs removed by MD during session, pt able to state 3/3 precautions with education throughout session and wife reading handout     Mobility  Bed Mobility Overal bed mobility: Needs Assistance Bed Mobility: Rolling, Sidelying to Sit Rolling: Min assist Sidelying to sit: Min assist       General bed mobility comments: min assist to roll with use of pad and rail, cues for sequence with assist to elevate trunk from surface    Transfers Overall transfer level: Needs assistance    Transfers: Sit to/from Stand Sit to Stand: Min assist           General transfer comment: min assist to rise from surface with cues for hand placement and posture    Ambulation/Gait Ambulation/Gait assistance: Min guard Gait Distance (Feet): 300 Feet Assistive device: Rolling walker (2 wheels) Gait Pattern/deviations: Step-through pattern, Decreased stride length   Gait velocity interpretation: >2.62 ft/sec, indicative of community ambulatory   General Gait Details: good posture with cues for direction, slow and steady speed   Stairs Stairs: Yes Stairs assistance: Min guard Stair Management: Sideways, Step to pattern, One rail Left Number of Stairs: 2 General stair comments: pt able to side step up stairs with rail with cues to prevent twisting   Wheelchair Mobility    Modified Rankin (Stroke Patients Only)       Balance Overall balance assessment: Needs assistance   Sitting balance-Leahy Scale: Good Sitting balance - Comments: static sitting without support   Standing balance support: Reliant on assistive device for balance, Bilateral upper extremity supported Standing balance-Leahy Scale: Poor Standing balance comment: reliant on RW in standing                            Cognition Arousal/Alertness: Awake/alert Behavior During Therapy: WFL for tasks assessed/performed Overall Cognitive Status: Within Functional Limits for tasks assessed                                          Exercises      General Comments  Pertinent Vitals/Pain Pain Assessment Pain Score: 2  Pain Location: back Pain Descriptors / Indicators: Aching Pain Intervention(s): Limited activity within patient's tolerance, Repositioned, Monitored during session    Home Living                          Prior Function            PT Goals (current goals can now be found in the care plan section) Progress towards PT goals: Progressing  toward goals    Frequency    Min 5X/week      PT Plan Current plan remains appropriate    Co-evaluation              AM-PAC PT "6 Clicks" Mobility   Outcome Measure  Help needed turning from your back to your side while in a flat bed without using bedrails?: A Little Help needed moving from lying on your back to sitting on the side of a flat bed without using bedrails?: A Little Help needed moving to and from a bed to a chair (including a wheelchair)?: A Little Help needed standing up from a chair using your arms (e.g., wheelchair or bedside chair)?: A Little Help needed to walk in hospital room?: A Little Help needed climbing 3-5 steps with a railing? : A Little 6 Click Score: 18    End of Session   Activity Tolerance: Patient tolerated treatment well Patient left: in chair;with call bell/phone within reach;with family/visitor present;with nursing/sitter in room Nurse Communication: Mobility status;Precautions PT Visit Diagnosis: Other abnormalities of gait and mobility (R26.89);Muscle weakness (generalized) (M62.81);Pain;Difficulty in walking, not elsewhere classified (R26.2)     Time: 0092-3300 PT Time Calculation (min) (ACUTE ONLY): 33 min  Charges:  $Gait Training: 8-22 mins $Therapeutic Activity: 8-22 mins                     Bayard Males, PT Acute Rehabilitation Services Office: [place] 12/02/2022, 1:06 PM

## 2022-12-02 NOTE — Progress Notes (Addendum)
Orthopedic Surgery Post-operative Progress Note  Assessment: Patient is a 76 y.o. male who is currently admitted after undergoing L2-5 PSIF, L4-5 laminectomies and foraminotomies   Plan: -Operative plans complete -Drains to be maintained until output slows -Out of bed as tolerated, no brace -No bending/lifting/twisting greater than 10 pounds -PT evaluate and treat -Pain control -Diabetic diet -No chemoprophylaxis for dvt or antiplatelets for 72 hours after surgery -Antibiotics x2 post-operative doses -Disposition: remain floor status  Hypocalcemia -Ca is 8.1 this morning -continue oral supplementation, will prescribe calcium at discharge  Hypokalemia -K is 3.3 this morning -normally is on oral supplementation at home, but reports not taking for the last week or so -oral supplementation today, will have him continue his home oral supplementation at discharge  CKD -Is at baseline creatinine -Will continue to monitor  Borderline diabetes -Blood sugar was elevated prior to surgery and is trending down today -Will continue to monitor  Acute blood loss anemia -Hgb is 7.3 this morning -Will transfuse this morning given his cardiac history and expectation that it will drift slightly lower with time   ___________________________________________________________________________   Subjective: No acute events overnight. Pain adequately controlled. No leg pain. Sensation in legs has improved since surgery. Denies paresthesias and numbness. Wants to go home today.   Objective:  General: no acute distress, appropriate affect Neurologic: alert, answering questions appropriately, following commands Respiratory: unlabored breathing on room air Skin: dressing clear/dry/intact, drains with serosanguinous output  MSK (spine):  -Strength exam      Right  Left  EHL    5/5  5/5 TA    5/5  5/5 GSC    5/5  5/5 Knee extension  5/5  5/5 Hip flexion   5/5  5/5  -Sensory  exam    Sensation intact to light touch in L3-S1 nerve distributions of bilateral lower extremities  Patient name: Derek [name] Patient MRN: [id number] Date: 12/02/22

## 2022-12-03 LAB — TYPE AND SCREEN
ABO/RH(D): B POS
Antibody Screen: NEGATIVE
Unit division: 0

## 2022-12-03 LAB — BPAM RBC
Blood Product Expiration Date: 202401142359
ISSUE DATE / TIME: 202312210913
Unit Type and Rh: 7300

## 2022-12-08 ENCOUNTER — Telehealth: Payer: Self-pay | Admitting: Orthopedic Surgery

## 2022-12-08 ENCOUNTER — Other Ambulatory Visit: Payer: Self-pay | Admitting: Cardiology

## 2022-12-08 DIAGNOSIS — R310 Gross hematuria: Secondary | ICD-10-CM

## 2022-12-08 MED ORDER — MILK OF MAGNESIA 7.75 % PO SUSP: 15.0000 mL | Freq: Every day | ORAL | 0 refills | Status: DC

## 2022-12-08 NOTE — Telephone Encounter (Signed)
Orthopedic Telephone Call  Patient has two issues post-operatively. First, he still has not had a bowel movement. I encouraged him to ambulate frequently. I also instructed him to keep using the senna. He should try milk of magnesia as well which was prescribed to him. He may have to try and enema next.   Second, he noticed blood in his urine that started yesterday. I explained that this could have been from the Foley used in surgery though this seems unlikely due to it just showing up now. I think UTI is possible so a UA was ordered which he should get done today. He also could have a kidney stone so may order a CT scan if this persists.  Patient and family expressed understanding. He is going to work on ambulating and using the senna and milk of mag. They are going to try to get the UA done this afternoon.   Callie Fielding, MD Orthopedic Surgeon

## 2022-12-08 NOTE — Telephone Encounter (Signed)
Pt's wife Katharine Look called to let Dr Laurance Flatten know pt just left from dropped off urine specimen. Pt phone number is [phone].

## 2022-12-08 NOTE — Addendum Note (Signed)
Addended by: Ileene Rubens on: 12/08/2022 01:26 PM   Modules accepted: Orders

## 2022-12-08 NOTE — Telephone Encounter (Signed)
He has been notified.

## 2022-12-09 ENCOUNTER — Telehealth: Payer: Self-pay | Admitting: Orthopedic Surgery

## 2022-12-09 LAB — UA/M W/RFLX CULTURE, ROUTINE
Bilirubin, UA: NEGATIVE
Glucose, UA: NEGATIVE
Ketones, UA: NEGATIVE
Leukocytes,UA: NEGATIVE
Nitrite, UA: NEGATIVE
RBC, UA: NEGATIVE
Specific Gravity, UA: 1.022 (ref 1.005–1.030)
Urobilinogen, Ur: 2 mg/dL — ABNORMAL HIGH (ref 0.2–1.0)
pH, UA: 7 (ref 5.0–7.5)

## 2022-12-09 LAB — MICROSCOPIC EXAMINATION
Bacteria, UA: NONE SEEN
Casts: NONE SEEN /lpf
Epithelial Cells (non renal): NONE SEEN /hpf (ref 0–10)
RBC, Urine: NONE SEEN /hpf (ref 0–2)

## 2022-12-09 NOTE — Telephone Encounter (Signed)
Pt's wife Katharine Look called to find out if any results on urine test from yesterdays appt. Please call Katharine Look at [phone]

## 2022-12-09 NOTE — Telephone Encounter (Signed)
Orthopedic Telephone Call  Spoke to patient this afternoon.  His hematic urea has resolved.  We went over his UA and explained that there is no concern for infection or RBCs in his urine on that evaluation.  I told him to hydrate well with water.  He reported to me that he had his first bowel movement since surgery.  I encouraged him to continue with the bowel regimen and ambulate is much as tolerated.  He is having left-sided low back pain.  I explained that some of this may be from the surgery itself and he can continue use the pain medications to help with that.  He is due to see me on 12/15/2022.  We will evaluate his symptoms at that time.  He expressed understanding the plan and all his questions were answered to his satisfaction.  Callie Fielding, MD Orthopedic Surgeon

## 2022-12-15 ENCOUNTER — Ambulatory Visit (INDEPENDENT_AMBULATORY_CARE_PROVIDER_SITE_OTHER): Payer: Medicare Other | Admitting: Orthopedic Surgery

## 2022-12-15 ENCOUNTER — Ambulatory Visit (INDEPENDENT_AMBULATORY_CARE_PROVIDER_SITE_OTHER): Payer: Medicare Other

## 2022-12-15 DIAGNOSIS — Z981 Arthrodesis status: Secondary | ICD-10-CM

## 2022-12-15 NOTE — Progress Notes (Addendum)
Orthopedic Surgery Post-operative Progress Note  Assessment: Patient is a 77 y.o. male who underwent L2-5 PSIF, L4-5 laminectomies and foraminotomies    Plan: -Operative plans complete -Does not need any refills at the moment, but will refill any pain medications up until 6 weeks after surgery -Out of bed as tolerated, no brace -No bending/lifting/twisting greater than 10 pounds -Follow up in 4 weeks, XRs at next visit: AP and lateral lumbar  ___________________________________________________________________________   Subjective: Doing well since surgery. Is at home. Only had the one episode of blood in the urine. Urinating normally now. Back pain improving. Still has some soreness particularly on the leg side in the paraspinal region. No leg pain. Has decreased sensation in the left anterolateral leg. No new numbness or paresthesias. He feels his symptoms have improved since the surgery. Overall, he is satisfied with the surgery thus far.   Objective:  General: no acute distress, appropriate affect Neurologic: alert, answering questions appropriately, following commands Respiratory: unlabored breathing on room air Skin: incision appears well approximated with no erythema, active or expressible drainage, or tenderness to palpation around the incision  MSK (spine):  -Strength exam      Right  Left  EHL    5/5  5/5 TA    5/5  5/5 GSC    5/5  5/5 Knee extension  5/5  5/5 Hip flexion   5/5  5/5  -Sensory exam    Sensation intact to light touch in L3-S1 nerve distributions of bilateral lower extremities (decreased in L4/5 distribution on the left side)  X-rays of the lumbar spine taken and reviewed today show posterior instrumentation from L2-L5 with no evidence of complication. No fracture or dislocation seen.    Patient name: Derek [name] Patient MRN: [id number] Date: 12/15/22

## 2023-01-12 ENCOUNTER — Ambulatory Visit (INDEPENDENT_AMBULATORY_CARE_PROVIDER_SITE_OTHER): Payer: Medicare Other

## 2023-01-12 ENCOUNTER — Ambulatory Visit: Payer: Medicare Other | Admitting: Orthopedic Surgery

## 2023-01-12 DIAGNOSIS — Z981 Arthrodesis status: Secondary | ICD-10-CM

## 2023-01-12 NOTE — Progress Notes (Signed)
Orthopedic Surgery Post-operative Office Note   Assessment: Patient is a 77 y.o. male who underwent L2-5 PSIF, L4-5 laminectomies and foraminotomies (~6 weeks post-op) -Date of surgery: 11/30/2022     Plan: -Operative plans complete -OTC pain medications as needed for pain relief -Out of bed as tolerated, no brace -Okay to submerge wound at this point -No bending/lifting/twisting greater than 10 pounds -Follow up in 6 weeks, XRs at next visit: AP and lateral lumbar   ___________________________________________________________________________     Subjective: Has been doing well since surgery.  Still having some pain around the left lumbar paraspinal muscle.  It has been gradually getting better with time.  He is no longer requiring any kind of pain medication including over-the-counter medications.  He is no longer ambulating with assistive devices.  No radiating leg pain.  Denies paresthesia numbness.  Has not noticed any redness or drainage around his incision.   Objective:   General: no acute distress, appropriate affect Neurologic: alert, answering questions appropriately, following commands Respiratory: unlabored breathing on room air Skin: incision appears well-healed with no evidence of infection   MSK (spine):   -Strength exam                                                   Right                Left   EHL                              5/5                  5/5 TA                                 5/5                  5/5 GSC                             5/5                  5/5 Knee extension            5/5                  5/5 Hip flexion                    5/5                  5/5   -Sensory exam                           Sensation intact to light touch in L3-S1 nerve distributions of bilateral lower extremities (decreased in L4/5 distribution on the left side)   X-rays of the lumbar spine taken and reviewed today show posterior instrumentation from L2-L5 with no  evidence of complication. No fracture or dislocation seen.      Patient name: Derek [name] Patient MRN: [id number] Date: 01/12/23

## 2023-02-01 DIAGNOSIS — L821 Other seborrheic keratosis: Secondary | ICD-10-CM | POA: Diagnosis not present

## 2023-02-01 DIAGNOSIS — D485 Neoplasm of uncertain behavior of skin: Secondary | ICD-10-CM | POA: Diagnosis not present

## 2023-02-01 DIAGNOSIS — L570 Actinic keratosis: Secondary | ICD-10-CM | POA: Diagnosis not present

## 2023-02-01 DIAGNOSIS — L820 Inflamed seborrheic keratosis: Secondary | ICD-10-CM | POA: Diagnosis not present

## 2023-02-10 ENCOUNTER — Encounter: Payer: Self-pay | Admitting: Radiology

## 2023-02-23 ENCOUNTER — Ambulatory Visit (INDEPENDENT_AMBULATORY_CARE_PROVIDER_SITE_OTHER): Payer: Medicare Other

## 2023-02-23 ENCOUNTER — Ambulatory Visit: Payer: Medicare Other | Admitting: Orthopedic Surgery

## 2023-02-23 DIAGNOSIS — Z981 Arthrodesis status: Secondary | ICD-10-CM

## 2023-02-23 DIAGNOSIS — M47817 Spondylosis without myelopathy or radiculopathy, lumbosacral region: Secondary | ICD-10-CM

## 2023-02-23 NOTE — Progress Notes (Signed)
Orthopedic Surgery Post-operative Office Note   Assessment: Patient is a 77 y.o. male who underwent L2-5 PSIF, L4-5 laminectomies and foraminotomies (~3 months post-op) -Date of surgery: 11/30/2022     Plan: -Operative plans complete -OTC pain medications as needed for pain relief -Referral provided to Dr. Ernestina Patches for L5/S1 facet injections for facet arthropathy -Discussed PT for his paraspinal pain and possible dry needling, patient was not interested at this time -He thinks this may be related to a kidney stone I told him he can get that worked up as well -Out of bed as tolerated, no brace -No spine specific precautions -Follow up in 12 weeks, XRs at next visit: AP/lateral/flex/ex   ___________________________________________________________________________     Subjective: Patient feels that he has been doing better since surgery.  He is not having any radiating leg pain.  He feels that his lower extremity symptoms are improved since surgery and he is satisfied with that aspect of the surgery.  He is still having left lower back in there region of the paraspinal muscles.  He feels it is worse if he is twisting or bending over.  He does not feel it as much if he is standing or sitting.  His daughter recently got an L5/S1 injection and got significant relief.  He is not sure exactly what was targeted with that injection but he is interested in trying that as well.  Denies paresthesias and numbness.   Objective:   General: no acute distress, appropriate affect Neurologic: alert, answering questions appropriately, following commands Respiratory: unlabored breathing on room air Skin: incision appears well-healed with no evidence of infection   MSK (spine):   -Strength exam                                                   Right                Left   EHL                              5/5                  5/5 TA                                 5/5                  5/5 GSC                              5/5                  5/5 Knee extension            5/5                  5/5 Hip flexion                    5/5                  5/5   -Sensory exam  Sensation intact to light touch in L3-S1 nerve distributions of bilateral lower extremities (decreased in L4/5 distribution on the left side)   X-rays of the lumbar spine taken and reviewed today show posterior instrumentation from L2-L5 with no evidence of complication. No fracture or dislocation seen.   Prior MRI of the lumbar spine showed hypertrophic L5/S1 facets with evidence of facet arthropathy     Patient name: Derek [name] Patient MRN: [id number] Date: 02/23/23

## 2023-02-28 ENCOUNTER — Encounter: Payer: Self-pay | Admitting: Internal Medicine

## 2023-02-28 ENCOUNTER — Ambulatory Visit (INDEPENDENT_AMBULATORY_CARE_PROVIDER_SITE_OTHER): Payer: Medicare Other | Admitting: Internal Medicine

## 2023-02-28 ENCOUNTER — Ambulatory Visit: Payer: Medicare Other | Admitting: Internal Medicine

## 2023-02-28 VITALS — BP 155/70 | HR 74 | Resp 16 | Ht 70.0 in | Wt 200.0 lb

## 2023-02-28 DIAGNOSIS — I10 Essential (primary) hypertension: Secondary | ICD-10-CM | POA: Diagnosis not present

## 2023-02-28 DIAGNOSIS — D649 Anemia, unspecified: Secondary | ICD-10-CM | POA: Diagnosis not present

## 2023-02-28 DIAGNOSIS — M7989 Other specified soft tissue disorders: Secondary | ICD-10-CM | POA: Diagnosis not present

## 2023-02-28 MED ORDER — LOSARTAN POTASSIUM 25 MG PO TABS: 25.0000 mg | ORAL_TABLET | Freq: Every day | ORAL | 0 refills | Status: DC

## 2023-02-28 NOTE — Progress Notes (Signed)
HPI:[name] is a 77 y.o. male living with GERD, HLD, HTN, ,T2DM diet controlled ,PVD, CAD s/p DES to RCA in 2007, persistent A.Flutter not on anticoagulation ( patient preference), carotid artery stenosis s/p L CEA 03/2020 and known R carotid artery occlusion , lumbar spine disease s/p L2-L5 PSIF and L4-L5 laminectomies and foraminotomies who presents to establish care.  Patient has not been having ongoing left paraspinal pain.  Dr. Laurance Flatten, patient's orthopedic surgeon, is working this up further and patient is scheduled for corticosteroid injection.  Patient questions whether this could be from his kidneys.  He is used to having his lab work drawn and then seeing his primary care physician 2 weeks later.  Today we discussed checking lab work for the changes we are making to medications, but he will follow-up with Dr. Doren Custard who will be assigned as his primary care physician in 2 weeks.  I will copy Dr. Doren Custard so he can consider ordering blood work before patient's visit.  Patient is on iron supplementation and has anemia on labs since recent surgery. He is up to date on screening colonoscopy.  He is taking 1/4 tablet of his 80 mg Lasix for fluid under his eyes and also fluid in his legs. He can not tolerate taking the whole dose because he urinates too much. He takes potassium when taking Lasix. He wears compression stockings as well.  Most days his lower extremity edema is controlled. His BP is elevated today and he says its been elevated for a while. He is taking medications as prescribed.    Past Medical History:  Diagnosis Date   Anxiety    09/17/2022- patient denies.   Arthritis    Atypical atrial flutter (HCC)    CAD (coronary artery disease)    a. s/p DES to RCA in 2007 b. s/p NST in 10/2015 which was intermediate risk and pt preferred medical management at that time   Carotid artery disease ([place])    Cataract    Chronic idiopathic thrombocytopenia (HCC)    Chronic renal  insufficiency    Single kidney   Complication of anesthesia    Coronary atherosclerosis of native coronary vessel    DES RCA 2007   Depression    10/18/22 - patient denies.   Dysrhythmia    Essential hypertension    GERD (gastroesophageal reflux disease)    History of kidney stones    years ago   HOH (hard of hearing)    Hx of colonic polyps 01/01/2015   Mixed hyperlipidemia    Myocardial infarction [facility]) 2006-07-05   STEMI    Peripheral vascular disease (HCC)    PONV (postoperative nausea and vomiting)     Past Surgical History:  Procedure Laterality Date   CARDIAC CATHETERIZATION  2010   Patent stent and nonobsturctive cad following an abnormal cardiolite study may of 2010 with an apical lateral defect   CARPAL TUNNEL RELEASE  11/28/2012   Procedure: CARPAL TUNNEL RELEASE;  Surgeon: Jessy Oto, MD;  Location: [place];  Service: Orthopedics;  Laterality: Left;  Left anterior submuscular transposition of ulnar nerve at elbow, left open carpal tunnel release   COLONOSCOPY     ELBOW SURGERY Left 2013   ENDARTERECTOMY Left 04/07/2020   Procedure: LEFT CAROTID ENDARTERECTOMY;  Surgeon: Marty Heck, MD;  Location: [place];  Service: Vascular;  Laterality: Left;   [place]   screws 1962   FORAMINOTOMY  2 LEVEL N/A 11/30/2022   Procedure: L3-4, L4-5 LAMINECTOMY AND FORAMINOTOMY;  Surgeon: Callie Fielding, MD;  Location: [place];  Service: Orthopedics;  Laterality: N/A;   FRACTURE SURGERY     HARDWARE REMOVAL  03/20/2012   Procedure: HARDWARE REMOVAL;  Surgeon: Jessy Oto, MD;  Location: [place];  Service: Orthopedics;  Laterality: Left;  Hardware removal 3 knowles pins left hip   HERNIA REPAIR     JOINT REPLACEMENT     LUMBAR LAMINECTOMY/DECOMPRESSION MICRODISCECTOMY N/A 10/08/2021   Procedure: Lumbar Two-Three Laminectomy/Foraminotomy, Posterolateral Arthrodesis, Non-Segmental Instrumentation Screw Fixation;  Surgeon: Ashok Pall, MD;   Location: [place];  Service: Neurosurgery;  Laterality: N/A;   PATCH ANGIOPLASTY Left 04/07/2020   Procedure: Patch Angioplasty Left Carotid;  Surgeon: Marty Heck, MD;  Location: [place];  Service: Vascular;  Laterality: Left;   TONSILLECTOMY     TOTAL HIP ARTHROPLASTY  03/20/2012   Procedure: TOTAL HIP ARTHROPLASTY;  Surgeon: Jessy Oto, MD;  Location: [place];  Service: Orthopedics;  Laterality: Left;  Left total hip replacement, ceramic on poly    Family History  Problem Relation Age of Onset   Hypertension Mother    Hypertension Father    Colon cancer Brother        has colostomy    Stroke Other    Coronary artery disease Other    Esophageal cancer Neg Hx    Stomach cancer Neg Hx    Rectal cancer Neg Hx    Colon polyps Neg Hx     Social History   Tobacco Use   Smoking status: Former    Packs/day: 3.00    Years: 42.00    Additional pack years: 0.00    Total pack years: 126.00    Types: Cigarettes    Start date: 10/30/1960    Quit date: 07/23/2004    Years since quitting: 18.6    Passive exposure: Never   Smokeless tobacco: Never   Tobacco comments:    Counseled to remain smoke free  Vaping Use   Vaping Use: Never used  Substance Use Topics   Alcohol use: No    Alcohol/week: 0.0 standard drinks of alcohol   Drug use: No   Current Outpatient Medications on File Prior to Visit  Medication Sig Dispense Refill   allopurinol (ZYLOPRIM) 100 MG tablet Take 100 mg by mouth 2 (two) times daily.     aspirin EC 81 MG tablet Take 81 mg by mouth daily. Swallow whole.     atorvastatin (LIPITOR) 20 MG tablet Take 1 tablet (20 mg total) by mouth daily. 90 tablet 3   calcium carbonate (OS-CAL - DOSED IN MG OF ELEMENTAL CALCIUM) 1250 (500 Ca) MG tablet Take 1 tablet (1,250 mg total) by mouth daily with breakfast. 14 tablet 0   cyanocobalamin (VITAMIN B12) 1000 MCG tablet Take 1,000 mcg by mouth daily.     ferrous sulfate 325 (65 FE) MG tablet Take 325 mg by mouth every other  day.     furosemide (LASIX) 80 MG tablet Take 40 mg by mouth daily.     hydrALAZINE (APRESOLINE) 25 MG tablet TAKE 1 TABLET BY MOUTH THREE TIMES DAILY 270 tablet 3   metoprolol succinate (TOPROL XL) 25 MG 24 hr tablet Take 1 tablet (25 mg total) by mouth daily. 90 tablet 3   Multiple Vitamins-Minerals (PRESERVISION AREDS PO) Take 1 tablet by mouth in the morning and at bedtime.     Omega-3 Fatty Acids (FISH OIL MAXIMUM  STRENGTH) 1200 MG CAPS Take 1,200 mg by mouth in the morning and at bedtime.     omeprazole (PRILOSEC) 20 MG capsule Take 20 mg by mouth daily.       Potassium 99 MG TABS Take 99 mg by mouth daily as needed (Cramping).     No current facility-administered medications on file prior to visit.     Physical Exam: Vitals:   02/28/23 1302  BP: (!) 155/70  Pulse: 74  Resp: 16  SpO2: 93%  Weight: 200 lb (90.7 kg)  Height: 5\' 10"  (1.778 m)     Physical Exam Constitutional:      General: He is not in acute distress.    Appearance: He is not ill-appearing.  HENT:     Mouth/Throat:     Mouth: Mucous membranes are moist.  Eyes:     General: No scleral icterus. Cardiovascular:     Rate and Rhythm: Normal rate and regular rhythm.     Heart sounds: No murmur heard. Pulmonary:     Effort: Pulmonary effort is normal.     Breath sounds: No wheezing or rales.  Musculoskeletal:     Right lower leg: No edema.     Left lower leg: No edema.  Skin:    General: Skin is warm.     Comments: Varicose veins      Assessment & Plan:   Leg swelling Patient has a history of bilateral leg swelling.  Review shows normal EF on TTE.  Stop Lasix Continue compression stockings, elevate legs at night   Primary hypertension BP 155/70 today.  He is currently taking hydralazine 25 mg 3 times daily, metoprolol 25 mg daily, and also Lasix.  He has CKD Stage 3b , baseline creatinine 1.6 to 2.   Stop Lasix Stop Hydralazine Continue Toprol Xl 25 mg daily Start Olmesartan 20 mg Follow up  in 2 weeks and check blood pressure at home. Follow up sooner if BP > 160/90  Anemia Review of labs anemia after appears to be from blood loss after recent surgery 3 months ago. On iron supplement.  His last colonoscopy was 03/2018. Follow up recommended for 2024.  Check CBC and Iron studies      Lorene Dy, MD

## 2023-02-28 NOTE — Assessment & Plan Note (Addendum)
BP 155/70 today.  He is currently taking hydralazine 25 mg 3 times daily, metoprolol 25 mg daily, and also Lasix.  He has CKD Stage 3b , baseline creatinine 1.6 to 2.   Stop Lasix Stop Hydralazine Continue Toprol Xl 25 mg daily Start Olmesartan 20 mg Follow up in 2 weeks and check blood pressure at home. Follow up sooner if BP > 160/90

## 2023-02-28 NOTE — Patient Instructions (Addendum)
Thank you, [name] for allowing [place] to provide your care today.   I have ordered the following labs for you:   Lab Orders         BMP8+EGFR         Microalbumin / creatinine urine ratio         CBC with Differential/Platelet         Fe+TIBC+Fer       Reminders: Stop Hydralazine. Stop Lasix. Start Losartan. If blood pressure at home is >160/90 , call and we can make adjustments  before your next visit.     Tamsen Snider, M.D.

## 2023-02-28 NOTE — Assessment & Plan Note (Addendum)
Patient has a history of bilateral leg swelling.  Review shows normal EF on TTE.  Stop Lasix Continue compression stockings, elevate legs at night

## 2023-02-28 NOTE — Assessment & Plan Note (Addendum)
Review of labs anemia after appears to be from blood loss after recent surgery 3 months ago. On iron supplement.  His last colonoscopy was 03/2018. Follow up recommended for 2024.  Check CBC and Iron studies

## 2023-03-01 LAB — IRON,TIBC AND FERRITIN PANEL
Ferritin: 104 ng/mL (ref 30–400)
Iron Saturation: 13 % — ABNORMAL LOW (ref 15–55)
Iron: 40 ug/dL (ref 38–169)
Total Iron Binding Capacity: 316 ug/dL (ref 250–450)
UIBC: 276 ug/dL (ref 111–343)

## 2023-03-01 LAB — BMP8+EGFR
BUN/Creatinine Ratio: 14 (ref 10–24)
BUN: 23 mg/dL (ref 8–27)
CO2: 25 mmol/L (ref 20–29)
Calcium: 9.9 mg/dL (ref 8.6–10.2)
Chloride: 104 mmol/L (ref 96–106)
Creatinine, Ser: 1.67 mg/dL — ABNORMAL HIGH (ref 0.76–1.27)
Glucose: 87 mg/dL (ref 70–99)
Potassium: 4.2 mmol/L (ref 3.5–5.2)
Sodium: 142 mmol/L (ref 134–144)
eGFR: 42 mL/min/{1.73_m2} — ABNORMAL LOW (ref >59)

## 2023-03-01 LAB — CBC WITH DIFFERENTIAL/PLATELET
Basophils Absolute: 0.1 10*3/uL (ref 0.0–0.2)
Basos: 1 %
EOS (ABSOLUTE): 0.5 10*3/uL — ABNORMAL HIGH (ref 0.0–0.4)
Eos: 10 %
Hematocrit: 37.2 % — ABNORMAL LOW (ref 37.5–51.0)
Hemoglobin: 11.9 g/dL — ABNORMAL LOW (ref 13.0–17.7)
Immature Grans (Abs): 0 10*3/uL (ref 0.0–0.1)
Immature Granulocytes: 0 %
Lymphocytes Absolute: 1 10*3/uL (ref 0.7–3.1)
Lymphs: 20 %
MCH: 25.9 pg — ABNORMAL LOW (ref 26.6–33.0)
MCHC: 32 g/dL (ref 31.5–35.7)
MCV: 81 fL (ref 79–97)
Monocytes Absolute: 0.6 10*3/uL (ref 0.1–0.9)
Monocytes: 11 %
Neutrophils Absolute: 3 10*3/uL (ref 1.4–7.0)
Neutrophils: 58 %
Platelets: 129 10*3/uL — ABNORMAL LOW (ref 150–450)
RBC: 4.6 x10E6/uL (ref 4.14–5.80)
RDW: 15.3 % (ref 11.6–15.4)
WBC: 5.2 10*3/uL (ref 3.4–10.8)

## 2023-03-07 ENCOUNTER — Telehealth: Payer: Self-pay | Admitting: Internal Medicine

## 2023-03-07 ENCOUNTER — Other Ambulatory Visit: Payer: Self-pay | Admitting: Internal Medicine

## 2023-03-07 MED ORDER — LOSARTAN POTASSIUM 50 MG PO TABS: 50.0000 mg | ORAL_TABLET | Freq: Every day | ORAL | 0 refills | Status: DC

## 2023-03-07 NOTE — Telephone Encounter (Signed)
He states his BP has been rising since he stopped the hydralazine and last night it was 185/102 and this am he took a hydralazine and now its 177/84. He said you wanted him to call in and let you know. Pls advise what he needs to do.

## 2023-03-07 NOTE — Progress Notes (Signed)
Increased dose of Losartan from 25 mg to 50 mg sent to pharmacy . Patient has follow up in a week for HTN.

## 2023-03-11 ENCOUNTER — Telehealth: Payer: Self-pay | Admitting: Cardiology

## 2023-03-11 ENCOUNTER — Encounter: Payer: Self-pay | Admitting: *Deleted

## 2023-03-11 NOTE — Telephone Encounter (Signed)
Pt c/o medication issue:  1. Name of Medication:   hydrALAZINE (APRESOLINE) 25 MG tablet  furosemide (LASIX) 80 MG tablet   2. How are you currently taking this medication (dosage and times per day)?   As prescribed  3. Are you having a reaction (difficulty breathing--STAT)?  Gained weight, chest feels tight, not drawing in air as he used to and HR is way up  4. What is your medication issue?    Patient stated his new PCP is taking him off these medications and putting him on losartan (COZAAR) 50 MG tablet (has been taking for 10 days).  Patient stated he wants advice from Dr. Domenic Polite on these medication changes.

## 2023-03-11 NOTE — Telephone Encounter (Signed)
Appointment given for Monday, 03/21/2023 with Finis Bud, NP as he has not been seen since September 2023.  Would be very difficult to make recommendations on current situation without review in office.  Patient verbalized understanding.

## 2023-03-14 ENCOUNTER — Ambulatory Visit: Payer: Medicare Other | Admitting: Internal Medicine

## 2023-03-15 ENCOUNTER — Other Ambulatory Visit: Payer: Self-pay

## 2023-03-15 ENCOUNTER — Ambulatory Visit (INDEPENDENT_AMBULATORY_CARE_PROVIDER_SITE_OTHER): Payer: Medicare Other | Admitting: Physical Medicine and Rehabilitation

## 2023-03-15 VITALS — BP 161/76 | HR 73

## 2023-03-15 DIAGNOSIS — M47816 Spondylosis without myelopathy or radiculopathy, lumbar region: Secondary | ICD-10-CM

## 2023-03-15 MED ORDER — METHYLPREDNISOLONE ACETATE 80 MG/ML IJ SUSP
80.0000 mg | Freq: Once | INTRAMUSCULAR | Status: AC
  Administered 2023-03-15: 80 mg

## 2023-03-15 NOTE — Progress Notes (Signed)
Functional Pain Scale - descriptive words and definitions  Distracting (5)    Aware of pain/able to complete some ADL's but limited by pain/sleep is affected and active distractions are only slightly useful. Moderate range order  Average Pain  varies on activity   +Driver, -BT, -Dye Allergies.  Lower back pain on left side with numbness from the left knee to the left foot

## 2023-03-15 NOTE — Patient Instructions (Signed)

## 2023-03-21 ENCOUNTER — Ambulatory Visit: Payer: Medicare Other | Admitting: Nurse Practitioner

## 2023-03-21 ENCOUNTER — Other Ambulatory Visit: Payer: Self-pay | Admitting: Student

## 2023-03-21 ENCOUNTER — Other Ambulatory Visit: Payer: Self-pay | Admitting: Cardiology

## 2023-03-23 DIAGNOSIS — R7301 Impaired fasting glucose: Secondary | ICD-10-CM | POA: Diagnosis not present

## 2023-03-23 DIAGNOSIS — I10 Essential (primary) hypertension: Secondary | ICD-10-CM | POA: Diagnosis not present

## 2023-03-23 DIAGNOSIS — E782 Mixed hyperlipidemia: Secondary | ICD-10-CM | POA: Diagnosis not present

## 2023-03-26 ENCOUNTER — Other Ambulatory Visit: Payer: Self-pay | Admitting: Cardiology

## 2023-03-28 NOTE — Procedures (Signed)
Lumbar Facet Joint Intra-Articular Injection(s) with Fluoroscopic Guidance  Patient: Derek [name]      Date of Birth: Dec 29, 1945 MRN: [id number] PCP: Billie Lade, MD      Visit Date: 03/15/2023   Universal Protocol:    Date/Time: 03/15/2023  Consent Given By: the patient  Position: PRONE   Additional Comments: Vital signs were monitored before and after the procedure. Patient was prepped and draped in the usual sterile fashion. The correct patient, procedure, and site was verified.   Injection Procedure Details:  Procedure Site One Meds Administered:  Meds ordered this encounter  Medications   methylPREDNISolone acetate (DEPO-MEDROL) injection 80 mg     Laterality: Bilateral  Location/Site:  L5-S1  Needle size: 22 guage  Needle type: Spinal  Needle Placement: Articular  Findings:  -Comments: Excellent flow of contrast producing a partial arthrogram.  Procedure Details: The fluoroscope beam is vertically oriented in AP, and the inferior recess is visualized beneath the lower pole of the inferior apophyseal process, which represents the target point for needle insertion. When direct visualization is difficult the target point is located at the medial projection of the vertebral pedicle. The region overlying each aforementioned target is locally anesthetized with a 1 to 2 ml. volume of 1% Lidocaine without Epinephrine.   The spinal needle was inserted into each of the above mentioned facet joints using biplanar fluoroscopic guidance. A 0.25 to 0.5 ml. volume of Isovue-250 was injected and a partial facet joint arthrogram was obtained. A single spot film was obtained of the resulting arthrogram.    One to 1.25 ml of the steroid/anesthetic solution was then injected into each of the facet joints noted above.   Additional Comments:  No complications occurred Dressing: 2 x 2 sterile gauze and Band-Aid    Post-procedure details: Patient was observed during the  procedure. Post-procedure instructions were reviewed.  Patient left the clinic in stable condition.

## 2023-03-28 NOTE — Progress Notes (Signed)
Derek [name] - 77 y.o. male MRN [id number]  Date of birth: 02-Mar-1946  Office Visit Note: Visit Date: 03/15/2023 PCP: Billie Lade, MD Referred by: London Sheer, MD  Subjective: Chief Complaint  Patient presents with   Lower Back - Pain   HPI:  Derek [name] is a 77 y.o. male who comes in today at the request of Dr. Willia Craze for planned Bilateral  L5-S1 Lumbar facet/medial branch block with fluoroscopic guidance.  The patient has failed conservative care including home exercise, medications, time and activity modification.  This injection will be diagnostic and hopefully therapeutic.  Please see requesting physician notes for further details and justification.  Exam has shown concordant pain with facet joint loading.   ROS Otherwise per HPI.  Assessment & Plan: Visit Diagnoses:    ICD-10-CM   1. Spondylosis without myelopathy or radiculopathy, lumbar region  M47.816 XR C-ARM NO REPORT    Facet Injection    methylPREDNISolone acetate (DEPO-MEDROL) injection 80 mg      Plan: No additional findings.   Meds & Orders:  Meds ordered this encounter  Medications   methylPREDNISolone acetate (DEPO-MEDROL) injection 80 mg    Orders Placed This Encounter  Procedures   Facet Injection   XR C-ARM NO REPORT    Follow-up: Return for visit to requesting provider as needed.   Procedures: No procedures performed  Lumbar Facet Joint Intra-Articular Injection(s) with Fluoroscopic Guidance  Patient: Derek [name]      Date of Birth: 1946-04-02 MRN: [id number] PCP: Billie Lade, MD      Visit Date: 03/15/2023   Universal Protocol:    Date/Time: 03/15/2023  Consent Given By: the patient  Position: PRONE   Additional Comments: Vital signs were monitored before and after the procedure. Patient was prepped and draped in the usual sterile fashion. The correct patient, procedure, and site was verified.   Injection Procedure Details:  Procedure Site One Meds  Administered:  Meds ordered this encounter  Medications   methylPREDNISolone acetate (DEPO-MEDROL) injection 80 mg     Laterality: Bilateral  Location/Site:  L5-S1  Needle size: 22 guage  Needle type: Spinal  Needle Placement: Articular  Findings:  -Comments: Excellent flow of contrast producing a partial arthrogram.  Procedure Details: The fluoroscope beam is vertically oriented in AP, and the inferior recess is visualized beneath the lower pole of the inferior apophyseal process, which represents the target point for needle insertion. When direct visualization is difficult the target point is located at the medial projection of the vertebral pedicle. The region overlying each aforementioned target is locally anesthetized with a 1 to 2 ml. volume of 1% Lidocaine without Epinephrine.   The spinal needle was inserted into each of the above mentioned facet joints using biplanar fluoroscopic guidance. A 0.25 to 0.5 ml. volume of Isovue-250 was injected and a partial facet joint arthrogram was obtained. A single spot film was obtained of the resulting arthrogram.    One to 1.25 ml of the steroid/anesthetic solution was then injected into each of the facet joints noted above.   Additional Comments:  No complications occurred Dressing: 2 x 2 sterile gauze and Band-Aid    Post-procedure details: Patient was observed during the procedure. Post-procedure instructions were reviewed.  Patient left the clinic in stable condition.    Clinical History: MRI LUMBAR SPINE WITHOUT AND WITH CONTRAST  TECHNIQUE: Multiplanar and multiecho pulse sequences of the lumbar spine were obtained without and with intravenous contrast.  CONTRAST: 17 mL MultiHance IV  COMPARISON: Lumbar MRI 09/15/2021  FINDINGS: Segmentation: 5 lumbar vertebra  Alignment: Mild retrolisthesis L4-5 otherwise normal alignment.  Vertebrae: Negative for fracture, mass, or osteomyelitis.  Conus medullaris and cauda  equina: Conus extends to the T12-L1 level. Conus and cauda equina appear normal.  Paraspinal and other soft tissues: Negative for paraspinous mass, adenopathy, or fluid collection. Marked atrophy right kidney  Disc levels:  L1-2: Shallow right paracentral disc protrusion unchanged. Negative for stenosis  L2-3: Interval laminectomy and bilateral pedicle screw fusion. No residual spinal stenosis. Diffuse disc bulging and bilateral facet degeneration. Mild to moderate subarticular stenosis bilaterally.  L3-4: Diffuse disc bulging and endplate spurring. Moderate facet degeneration bilaterally. Mild spinal stenosis unchanged. Moderate subarticular and foraminal stenosis bilaterally also unchanged.  L4-5: Central and right-sided disc protrusion. Disc protrusion extends into the right foramen. There is an extruded disc fragment extending caudally behind the L4 vertebral body on the right which has improved in the interval. There remains severe subarticular and foraminal stenosis on the right.. Mild spinal stenosis. Moderate left subarticular stenosis.  L5-S1: Small central disc protrusion. Mild facet hypertrophy. Mild foraminal narrowing bilaterally.  IMPRESSION: Interval laminectomy and pedicle screw fusion L2-3. There remains mild to moderate subarticular stenosis bilaterally.  Mild spinal stenosis L3-4. Moderate subarticular and foraminal stenosis bilaterally unchanged  Central and right-sided disc protrusion L4-5 causing severe subarticular and foraminal stenosis on the right. The extruded fragment on the right has mildly improved from the prior MRI.   Electronically Signed By: Marlan Palau M.D. On: 01/06/2022 12:49     Objective:  VS:  HT:    WT:   BMI:     BP:(!) 161/76  HR:73bpm  TEMP: ( )  RESP:  Physical Exam Vitals and nursing note reviewed.  Constitutional:      General: He is not in acute distress.    Appearance: Normal appearance. He is not  ill-appearing.  HENT:     Head: Normocephalic and atraumatic.     Right Ear: External ear normal.     Left Ear: External ear normal.     Nose: No congestion.  Eyes:     Extraocular Movements: Extraocular movements intact.  Cardiovascular:     Rate and Rhythm: Normal rate.     Pulses: Normal pulses.  Pulmonary:     Effort: Pulmonary effort is normal. No respiratory distress.  Abdominal:     General: There is no distension.     Palpations: Abdomen is soft.  Musculoskeletal:        General: No tenderness or signs of injury.     Cervical back: Neck supple.     Right lower leg: No edema.     Left lower leg: No edema.     Comments: Patient has good distal strength without clonus.  Skin:    Findings: No erythema or rash.  Neurological:     General: No focal deficit present.     Mental Status: He is alert and oriented to person, place, and time.     Sensory: No sensory deficit.     Motor: No weakness or abnormal muscle tone.     Coordination: Coordination normal.  Psychiatric:        Mood and Affect: Mood normal.        Behavior: Behavior normal.      Imaging: No results found.

## 2023-03-31 ENCOUNTER — Other Ambulatory Visit: Payer: Self-pay | Admitting: Cardiology

## 2023-04-01 DIAGNOSIS — I10 Essential (primary) hypertension: Secondary | ICD-10-CM | POA: Diagnosis not present

## 2023-04-01 DIAGNOSIS — R809 Proteinuria, unspecified: Secondary | ICD-10-CM | POA: Diagnosis not present

## 2023-04-01 DIAGNOSIS — D696 Thrombocytopenia, unspecified: Secondary | ICD-10-CM | POA: Diagnosis not present

## 2023-04-01 DIAGNOSIS — I482 Chronic atrial fibrillation, unspecified: Secondary | ICD-10-CM | POA: Diagnosis not present

## 2023-04-01 DIAGNOSIS — N1832 Chronic kidney disease, stage 3b: Secondary | ICD-10-CM | POA: Diagnosis not present

## 2023-04-01 DIAGNOSIS — M545 Low back pain, unspecified: Secondary | ICD-10-CM | POA: Diagnosis not present

## 2023-04-01 DIAGNOSIS — I129 Hypertensive chronic kidney disease with stage 1 through stage 4 chronic kidney disease, or unspecified chronic kidney disease: Secondary | ICD-10-CM | POA: Diagnosis not present

## 2023-04-01 DIAGNOSIS — I4892 Unspecified atrial flutter: Secondary | ICD-10-CM | POA: Diagnosis not present

## 2023-04-01 DIAGNOSIS — E782 Mixed hyperlipidemia: Secondary | ICD-10-CM | POA: Diagnosis not present

## 2023-04-01 DIAGNOSIS — M7989 Other specified soft tissue disorders: Secondary | ICD-10-CM | POA: Diagnosis not present

## 2023-04-01 DIAGNOSIS — M5432 Sciatica, left side: Secondary | ICD-10-CM | POA: Diagnosis not present

## 2023-04-04 DIAGNOSIS — H353121 Nonexudative age-related macular degeneration, left eye, early dry stage: Secondary | ICD-10-CM | POA: Diagnosis not present

## 2023-04-11 ENCOUNTER — Ambulatory Visit: Payer: Medicare Other | Attending: Cardiology | Admitting: Cardiology

## 2023-04-11 ENCOUNTER — Encounter: Payer: Self-pay | Admitting: Cardiology

## 2023-04-11 ENCOUNTER — Other Ambulatory Visit: Payer: Self-pay | Admitting: Cardiology

## 2023-04-11 ENCOUNTER — Ambulatory Visit: Payer: Medicare Other | Attending: Cardiology

## 2023-04-11 VITALS — BP 142/78 | HR 75 | Ht 70.0 in | Wt 196.4 lb

## 2023-04-11 DIAGNOSIS — I10 Essential (primary) hypertension: Secondary | ICD-10-CM | POA: Diagnosis not present

## 2023-04-11 DIAGNOSIS — R002 Palpitations: Secondary | ICD-10-CM

## 2023-04-11 DIAGNOSIS — I484 Atypical atrial flutter: Secondary | ICD-10-CM | POA: Diagnosis not present

## 2023-04-11 DIAGNOSIS — I25119 Atherosclerotic heart disease of native coronary artery with unspecified angina pectoris: Secondary | ICD-10-CM | POA: Diagnosis not present

## 2023-04-11 DIAGNOSIS — I714 Abdominal aortic aneurysm, without rupture, unspecified: Secondary | ICD-10-CM

## 2023-04-11 MED ORDER — HYDRALAZINE HCL 50 MG PO TABS: 50.0000 mg | ORAL_TABLET | Freq: Three times a day (TID) | ORAL | 3 refills | Status: DC

## 2023-04-11 NOTE — Patient Instructions (Addendum)
Medication Instructions:  Your physician has recommended you make the following change in your medication:  Increase hydralazine to 50 mg three times daily Continue other medications the same  Labwork: none  Testing/Procedures: Your physician has requested that you have an abdominal aorta duplex. During this test, an ultrasound is used to evaluate the aorta. Allow 30 minutes for this exam. Do not eat after midnight the day before and avoid carbonated beverages Your physician has recommended that you wear a Zio monitor.   This monitor is a medical device that records the heart's electrical activity. Doctors most often use these monitors to diagnose arrhythmias. Arrhythmias are problems with the speed or rhythm of the heartbeat. The monitor is a small device applied to your chest. You can wear one while you do your normal daily activities. While wearing this monitor if you have any symptoms to push the button and record what you felt. Once you have worn this monitor for the period of time provider prescribed (for 7 days), you will return the monitor device in the postage paid box. Once it is returned they will download the data collected and provide [place] with a report which the provider will then review and we will call you with those results. Important tips:  Avoid showering during the first 24 hours of wearing the monitor. Avoid excessive sweating to help maximize wear time. Do not submerge the device, no hot tubs, and no swimming pools. Keep any lotions or oils away from the patch. After 24 hours you may shower with the patch on. Take brief showers with your back facing the shower head.  Do not remove patch once it has been placed because that will interrupt data and decrease adhesive wear time. Push the button when you have any symptoms and write down what you were feeling. Once you have completed wearing your monitor, remove and place into box which has postage paid and place in your outgoing  mailbox.  If for some reason you have misplaced your box then call our office and we can provide another box and/or mail it off for you.  Follow-Up: Your physician recommends that you schedule a follow-up appointment in: 3 months  Any Other Special Instructions Will Be Listed Below (If Applicable).  If you need a refill on your cardiac medications before your next appointment, please call your pharmacy.

## 2023-04-11 NOTE — Progress Notes (Signed)
Cardiology Office Note  Date: 04/11/2023   ID: Bryne, Lindon [birth year], MRN [id number]  History of Present Illness: Derek [name] is a 77 y.o. male last seen in September 2023.  He is here for a follow-up visit.  Patient was hospitalized in December of last year with lumbar spine surgery.  Imaging studies during that time indicated significant abdominal aortic atherosclerosis with 3.2 cm enlargement.  We discussed getting a dedicated study to follow this going forward.  He does not report any definite angina, but has had a feeling of "squiggles" in his chest, seems to indicate sense of palpitations.  He has also had some bradycardia on vital sign checks at home.  Blood pressure is not optimally controlled.  I reviewed his medications and we discussed uptitrating his hydralazine.  He remains on aspirin, Lipitor, and Toprol-XL otherwise.  He has consistently declined anticoagulation for stroke prophylaxis.  Physical Exam: VS:  BP (!) 142/78   Pulse 75   Ht 5\' 10"  (1.778 m)   Wt 196 lb 6.4 oz (89.1 kg)   SpO2 96%   BMI 28.18 kg/m , BMI Body mass index is 28.18 kg/m.  Wt Readings from Last 3 Encounters:  04/11/23 196 lb 6.4 oz (89.1 kg)  02/28/23 200 lb (90.7 kg)  11/30/22 190 lb (86.2 kg)    General: Patient appears comfortable at rest. HEENT: Conjunctiva and lids normal. Neck: Supple, no elevated JVP or carotid bruits. Lungs: Clear to auscultation, nonlabored breathing at rest. Cardiac: Regular rate and rhythm, no S3, 1/6 systolic murmur. Extremities: No pitting edema.  ECG:  An ECG dated 09/01/2022 was personally reviewed today and demonstrated:  Atypical atrial flutter with 3:1 block, nonspecific ST changes.  Labwork: 02/28/2023: BUN 23; Creatinine, Ser 1.67; Hemoglobin 11.9; Platelets 129; Potassium 4.2; Sodium 142   Other Studies Reviewed Today:  Echocardiogram 09/01/2022:  1. Left ventricular ejection fraction, by estimation, is 50 to 55%. The  left ventricle  has low normal function. The left ventricle demonstrates  regional wall motion abnormalities (see scoring diagram/findings for  description). There is mild asymmetric  left ventricular hypertrophy of the septal segment. Left ventricular  diastolic parameters are indeterminate. The average left ventricular  global longitudinal strain is -18.2 %. The global longitudinal strain is  normal.   2. Right ventricular systolic function is normal. The right ventricular  size is normal. There is normal pulmonary artery systolic pressure. The  estimated right ventricular systolic pressure is 26.6 mmHg.   3. Left atrial size was mildly dilated.   4. The mitral valve is grossly normal. Trivial mitral valve  regurgitation.   5. The aortic valve is tricuspid. Aortic valve regurgitation is not  visualized. Aortic valve sclerosis/calcification is present, without any  evidence of aortic stenosis.   6. The inferior vena cava is normal in size with greater than 50%  respiratory variability, suggesting right atrial pressure of 3 mmHg.   Lexiscan Myoview 09/08/2022:   Findings are consistent with prior inferolateral myocardial infarction. The study is intermediate risk.   No ST deviation was noted.   LV perfusion is abnormal. There is no evidence of ischemia. There is evidence of infarction. Defect 1: There is a medium defect with severe reduction in uptake present in the apical to mid inferolateral location(s) that is fixed. There is abnormal wall motion in the defect area. Consistent with infarction.   Left ventricular function is abnormal. Global function is moderately reduced. Nuclear stress EF: 38 %. The left  ventricular ejection fraction is moderately decreased (30-44%). End diastolic cavity size is mildly enlarged.   Inferolateral infarct pattern was seen on prior nuclear stress test.  Lumbar spine 12/01/2022: IMPRESSION: Postsurgical changes of posterior lumbar fusion revision, now L2-L5. No evidence  of immediate complication.   Extensive calcifications of the abdominal aorta, measuring up to 3.2 cm in AP dimension, which would meet criteria for abdominal aortic aneurysm. Recommend non-emergent abdominal aorta ultrasound or abdominopelvic CT.  Assessment and Plan:  1.  CAD status post DES to the RCA in 2007.  Follow-up Lexiscan Myoview in September 2023 was consistent with inferolateral infarct scar but no ischemia.  Echocardiogram revealed LVEF 50 to 55%.  He does not report any definite angina.  Continue aspirin, Lipitor, and Toprol-XL.  2.  Intermittent palpitations and possible bradycardia at times based on home vital sign checks.  Obtain 7-day ZIO monitor for further investigation of rhythm.  3.  Aortic atherosclerosis with 3.2 cm dimension by lumbar spine films in December 2023.  We will obtain an abdominal ultrasound for directed aortic imaging.  4.  Essential hypertension, increase hydralazine to 50 mg 3 times a day.  5.  Permanent atypical atrial flutter with CHA2DS2-VASc score of 4.  He is asymptomatic and continues on Toprol-XL.  He has declined anticoagulation over time.  6.  CKD stage IIIb, creatinine 1.67.  7.  Bilateral carotid artery disease with known occlusion of the RICA and moderate LICA stenosis.  He continues to follow with [facility].  Disposition:  Follow up  3 months.  Signed, Jonelle Sidle, M.D., F.A.C.C. [facility] at [facility]

## 2023-04-12 ENCOUNTER — Telehealth: Payer: Self-pay | Admitting: Cardiology

## 2023-04-12 NOTE — Telephone Encounter (Signed)
7 Day ZIO XT/McDowell

## 2023-04-14 ENCOUNTER — Ambulatory Visit (HOSPITAL_COMMUNITY)
Admission: RE | Admit: 2023-04-14 | Discharge: 2023-04-14 | Disposition: A | Discharge status: Home / Self Care | Payer: Medicare Other | Source: Ambulatory Visit | Attending: Cardiology | Admitting: Cardiology

## 2023-04-14 DIAGNOSIS — I714 Abdominal aortic aneurysm, without rupture, unspecified: Secondary | ICD-10-CM | POA: Diagnosis not present

## 2023-04-18 ENCOUNTER — Telehealth: Payer: Self-pay | Admitting: Cardiology

## 2023-04-18 NOTE — Telephone Encounter (Signed)
*  STAT* If patient is at the pharmacy, call can be transferred to refill team.   1. Which medications need to be refilled? (please list name of each medication and dose if known)  new presription for Nitroglycerin  2. Which pharmacy/location (including street and city if local pharmacy) is medication to be sent to? [facility]   [place]  3. Do they need a 30 day or 90 day supply?

## 2023-04-18 NOTE — Telephone Encounter (Signed)
Will send to provider for approval of nitro rx since not on medication profile.

## 2023-04-19 MED ORDER — NITROGLYCERIN 0.4 MG SL SUBL: 0.4000 mg | SUBLINGUAL_TABLET | SUBLINGUAL | 3 refills | Status: DC | PRN

## 2023-04-27 ENCOUNTER — Telehealth: Payer: Self-pay | Admitting: Cardiology

## 2023-04-27 DIAGNOSIS — R002 Palpitations: Secondary | ICD-10-CM | POA: Diagnosis not present

## 2023-04-27 NOTE — Telephone Encounter (Signed)
Abnormal Cardiac Results, She'd like a callback and stated she couldn't stay on hold long. Reference number [id number]

## 2023-04-27 NOTE — Telephone Encounter (Signed)
  Patch Wear Time:  9 days and 10 hours (2024-04-29T16:55:43-0400 to 2024-05-09T03:14:11-399)   4 Ventricular Tachycardia runs occurred, the run with the fastest interval lasting 4 beats with a max rate of 182 bpm, the longest lasting 13 beats with an avg rate of 115 bpm. Atrial Flutter occurred continuously (100% burden), ranging from 31-106 bpm  (avg of 73 bpm). 30 Pauses occurred, the longest lasting 3.5 secs (17 bpm). Ventricular Tachycardia was detected within +/- 45 seconds of symptomatic patient event(s). Isolated VEs were rare (<1.0%, 2554), VE Couplets were rare (<1.0%, 192), and VE  Triplets were rare (<1.0%, 7). Ventricular Bigeminy was present. MD notification criteria for Slow Atrial Flutter and Symptomatic Bradycardia met - report posted prior to notification per account request (SM).     I will forward to Dr.McDowell for review.

## 2023-04-27 NOTE — Telephone Encounter (Signed)
Report is downloaded for review.

## 2023-05-03 ENCOUNTER — Other Ambulatory Visit: Payer: Self-pay | Admitting: *Deleted

## 2023-05-03 DIAGNOSIS — I739 Peripheral vascular disease, unspecified: Secondary | ICD-10-CM

## 2023-05-06 ENCOUNTER — Encounter: Payer: Self-pay | Admitting: Internal Medicine

## 2023-05-17 ENCOUNTER — Encounter: Payer: Self-pay | Admitting: Vascular Surgery

## 2023-05-17 ENCOUNTER — Ambulatory Visit (INDEPENDENT_AMBULATORY_CARE_PROVIDER_SITE_OTHER)
Admission: RE | Admit: 2023-05-17 | Discharge: 2023-05-17 | Disposition: A | Discharge status: Home / Self Care | Payer: Medicare Other | Source: Ambulatory Visit | Attending: Vascular Surgery | Admitting: Vascular Surgery

## 2023-05-17 ENCOUNTER — Ambulatory Visit: Payer: Medicare Other | Admitting: Vascular Surgery

## 2023-05-17 ENCOUNTER — Ambulatory Visit (HOSPITAL_COMMUNITY)
Admission: RE | Admit: 2023-05-17 | Discharge: 2023-05-17 | Disposition: A | Discharge status: Home / Self Care | Payer: Medicare Other | Source: Ambulatory Visit | Attending: Vascular Surgery | Admitting: Vascular Surgery

## 2023-05-17 VITALS — BP 159/74 | HR 75 | Temp 97.8°F | Resp 16 | Ht 70.0 in | Wt 194.0 lb

## 2023-05-17 DIAGNOSIS — I739 Peripheral vascular disease, unspecified: Secondary | ICD-10-CM | POA: Diagnosis not present

## 2023-05-17 DIAGNOSIS — I6522 Occlusion and stenosis of left carotid artery: Secondary | ICD-10-CM

## 2023-05-17 DIAGNOSIS — I6523 Occlusion and stenosis of bilateral carotid arteries: Secondary | ICD-10-CM | POA: Diagnosis not present

## 2023-05-17 LAB — VAS US ABI WITH/WO TBI
Left ABI: 0.74
Right ABI: 0.86

## 2023-05-17 NOTE — Progress Notes (Signed)
Patient name: Derek [name] MRN: [id number] DOB: May 04, 1946 Sex: male  REASON FOR VISIT: 6 month follow-up for carotid artery surveillance  HPI: [name] is a 77 y.o. male who presents for 6 month follow-up and surveillance of his carotid artery disease.  He has a known right carotid artery occlusion.  He underwent a left carotid endarterectomy on 04/07/2020 for an asymptomatic high-grade stenosis by myself.  We have been following a 60 to 79% stenosis on the left after carotid endarterectomy.  He denies any associated neurologic events since last follow-up.  Complaining of leg pain on his last visit.  Fortunately since his back surgery his leg pain is better.  Complains of ongoing left leg swelling that has been chronic for years.    Past Medical History:  Diagnosis Date   Anxiety    09/17/2022- patient denies.   Arthritis    Atypical atrial flutter (HCC)    CAD (coronary artery disease)    a. s/p DES to RCA in 2007 b. s/p NST in 10/2015 which was intermediate risk and pt preferred medical management at that time   Carotid artery disease (HCC)    Cataract    Chronic idiopathic thrombocytopenia (HCC)    Chronic renal insufficiency    Single kidney   Complication of anesthesia    Coronary atherosclerosis of native coronary vessel    DES RCA 2007   Depression    10/18/22 - patient denies.   Dysrhythmia    Essential hypertension    GERD (gastroesophageal reflux disease)    History of kidney stones    years ago   HOH (hard of hearing)    Hx of colonic polyps 01/01/2015   Mixed hyperlipidemia    Myocardial infarction [facility]) 2006-07-05   STEMI    Peripheral vascular disease (HCC)    PONV (postoperative nausea and vomiting)     Past Surgical History:  Procedure Laterality Date   CARDIAC CATHETERIZATION  2010   Patent stent and nonobsturctive cad following an abnormal cardiolite study may of 2010 with an apical lateral defect   CARPAL TUNNEL RELEASE  11/28/2012   Procedure:  CARPAL TUNNEL RELEASE;  Surgeon: Kerrin Champagne, MD;  Location: [facility] OR;  Service: Orthopedics;  Laterality: Left;  Left anterior submuscular transposition of ulnar nerve at elbow, left open carpal tunnel release   COLONOSCOPY     ELBOW SURGERY Left 2013   ENDARTERECTOMY Left 04/07/2020   Procedure: LEFT CAROTID ENDARTERECTOMY;  Surgeon: Cephus Shelling, MD;  Location: [facility] OR;  Service: Vascular;  Laterality: Left;   EYE SURGERY     FEMUR FRACTURE SURGERY  1962   screws 1962   FORAMINOTOMY 2 LEVEL N/A 11/30/2022   Procedure: L3-4, L4-5 LAMINECTOMY AND FORAMINOTOMY;  Surgeon: London Sheer, MD;  Location: [facility] OR;  Service: Orthopedics;  Laterality: N/A;   FRACTURE SURGERY     HARDWARE REMOVAL  03/20/2012   Procedure: HARDWARE REMOVAL;  Surgeon: Kerrin Champagne, MD;  Location: [facility] OR;  Service: Orthopedics;  Laterality: Left;  Hardware removal 3 knowles pins left hip   HERNIA REPAIR     JOINT REPLACEMENT     LUMBAR LAMINECTOMY/DECOMPRESSION MICRODISCECTOMY N/A 10/08/2021   Procedure: Lumbar Two-Three Laminectomy/Foraminotomy, Posterolateral Arthrodesis, Non-Segmental Instrumentation Screw Fixation;  Surgeon: Coletta Memos, MD;  Location: [facility] OR;  Service: Neurosurgery;  Laterality: N/A;   PATCH ANGIOPLASTY Left 04/07/2020   Procedure: Patch Angioplasty Left Carotid;  Surgeon: Cephus Shelling, MD;  Location: [facility] OR;  Service:  Vascular;  Laterality: Left;   TONSILLECTOMY     TOTAL HIP ARTHROPLASTY  03/20/2012   Procedure: TOTAL HIP ARTHROPLASTY;  Surgeon: Kerrin Champagne, MD;  Location: [facility] OR;  Service: Orthopedics;  Laterality: Left;  Left total hip replacement, ceramic on poly    Family History  Problem Relation Age of Onset   Hypertension Mother    Hypertension Father    Colon cancer Brother        has colostomy    Stroke Other    Coronary artery disease Other    Esophageal cancer Neg Hx    Stomach cancer Neg Hx    Rectal cancer Neg Hx    Colon polyps Neg Hx     SOCIAL  HISTORY: Social History   Tobacco Use   Smoking status: Former    Packs/day: 3.00    Years: 42.00    Additional pack years: 0.00    Total pack years: 126.00    Types: Cigarettes    Start date: 10/30/1960    Quit date: 07/23/2004    Years since quitting: 18.8    Passive exposure: Never   Smokeless tobacco: Never   Tobacco comments:    Counseled to remain smoke free  Substance Use Topics   Alcohol use: No    Alcohol/week: 0.0 standard drinks of alcohol    Allergies  Allergen Reactions   Nsaids Other (See Comments)    One Kidney   Prednisone Swelling    Causes swelling in legs and feet     Current Outpatient Medications  Medication Sig Dispense Refill   allopurinol (ZYLOPRIM) 100 MG tablet Take 100 mg by mouth 2 (two) times daily.     aspirin EC 81 MG tablet Take 81 mg by mouth daily. Swallow whole.     atorvastatin (LIPITOR) 20 MG tablet Take 1 tablet by mouth once daily 90 tablet 0   calcium carbonate (OS-CAL - DOSED IN MG OF ELEMENTAL CALCIUM) 1250 (500 Ca) MG tablet Take 1 tablet (1,250 mg total) by mouth daily with breakfast. 14 tablet 0   cyanocobalamin (VITAMIN B12) 1000 MCG tablet Take 1,000 mcg by mouth daily.     diazepam (VALIUM) 2 MG tablet Take 2 mg by mouth 2 (two) times daily.     ferrous sulfate 325 (65 FE) MG tablet Take 325 mg by mouth every other day.     furosemide (LASIX) 80 MG tablet Take 40 mg by mouth daily.     hydrALAZINE (APRESOLINE) 50 MG tablet Take 1 tablet (50 mg total) by mouth 3 (three) times daily. 270 tablet 3   methocarbamol (ROBAXIN) 500 MG tablet Take 1 tablet by mouth 2 (two) times daily.     metoprolol succinate (TOPROL-XL) 25 MG 24 hr tablet Take 1 tablet by mouth once daily 90 tablet 0   Multiple Vitamins-Minerals (PRESERVISION AREDS PO) Take 1 tablet by mouth in the morning and at bedtime.     nitroGLYCERIN (NITROSTAT) 0.4 MG SL tablet Place 1 tablet (0.4 mg total) under the tongue every 5 (five) minutes x 3 doses as needed for chest  pain (if no relief after 3rd dose, proceed to ED or call 911). 25 tablet 3   Omega-3 Fatty Acids (FISH OIL MAXIMUM STRENGTH) 1200 MG CAPS Take 1,200 mg by mouth in the morning and at bedtime.     omeprazole (PRILOSEC) 20 MG capsule Take 20 mg by mouth daily.       Potassium 99 MG TABS Take 99 mg by mouth  daily as needed (Cramping).     No current facility-administered medications for this visit.    REVIEW OF SYSTEMS:  [X]  denotes positive finding, [ ]  denotes negative finding Cardiac  Comments:  Chest pain or chest pressure:    Shortness of breath upon exertion:    Short of breath when lying flat:    Irregular heart rhythm:        Vascular    Pain in calf, thigh, or hip brought on by ambulation:    Pain in feet at night that wakes you up from your sleep:     Blood clot in your veins:    Leg swelling:  x left      Pulmonary    Oxygen at home:    Productive cough:     Wheezing:         Neurologic    Sudden weakness in arms or legs:     Sudden numbness in arms or legs:     Sudden onset of difficulty speaking or slurred speech:    Temporary loss of vision in one eye:     Problems with dizziness:         Gastrointestinal    Blood in stool:     Vomited blood:         Genitourinary    Burning when urinating:     Blood in urine:        Psychiatric    Major depression:         Hematologic    Bleeding problems:    Problems with blood clotting too easily:        Skin    Rashes or ulcers:        Constitutional    Fever or chills:      PHYSICAL EXAM: Vitals:   05/17/23 1550 05/17/23 1555  BP: (!) 164/82 (!) 159/74  Pulse: 75 75  Resp: 16   Temp: 97.8 F (36.6 C)   TempSrc: Temporal   SpO2: 95%   Weight: 194 lb (88 kg)   Height: 5\' 10"  (1.778 m)     GENERAL: The patient is a well-nourished male, in no acute distress. The vital signs are documented above. CARDIAC: There is a regular rate and rhythm.  VASCULAR:  Left neck incision well healed. PULMONARY: No  respiratory distress. ABDOMEN: Soft and non-tender. MUSCULOSKELETAL: There are no major deformities or cyanosis. NEUROLOGIC: No focal weakness or paresthesias are detected. Cn II-XII appear grossly intact.     DATA:   Carotid duplex today again shows right ICA occlusion and a left ICA velocity consistent with 60-79% stenosis with velocity 234/78 (previously 199/71 and prior to that 234/78)   ABIs today were 0.86 multiphasic on the right biphasic and 0.74 on the left multiphasic    Assessment/Plan:  77 year old male status post left carotid endarterectomy on 04/07/2020 for an asymptomatic high-grade stenosis in the setting of known right ICA occlusion.  He continues to do well and is asymptomatic from his carotid disease.  In evaluating his velocities they have been stable on each 6 month visit at 265/97 --> 253/79 --> 234/78 --> 199/71 --> 234/78 in the left ICA where he had carotid endarterectomy.  Again I think these velocities are overestimated given contralateral occlusion and tortuosity in the vessel.  In the absence of symptoms there would be no indication for intervention unless greater than 80% recurrent stenosis.  I will see him again in 6 months.  His lower extremity symptoms have resolved  since his back surgery with Dr. Christell Constant.  Fortunately does not need any further lower extremity intervention at this time.  His ABIs today are 0.86 on the right and 0.74 on the left.  Complaint of left leg swelling and I again discussed elevation compression and exercise.  He has a fair amount of deep venous reflux as demonstrated on prior reflux studies.  Cephus Shelling, MD [facility] Office: [phone]

## 2023-05-19 ENCOUNTER — Other Ambulatory Visit: Payer: Self-pay

## 2023-05-19 DIAGNOSIS — I6523 Occlusion and stenosis of bilateral carotid arteries: Secondary | ICD-10-CM

## 2023-05-26 ENCOUNTER — Other Ambulatory Visit (INDEPENDENT_AMBULATORY_CARE_PROVIDER_SITE_OTHER): Payer: Medicare Other

## 2023-05-26 ENCOUNTER — Ambulatory Visit: Payer: Medicare Other | Admitting: Orthopedic Surgery

## 2023-05-26 DIAGNOSIS — M7062 Trochanteric bursitis, left hip: Secondary | ICD-10-CM | POA: Diagnosis not present

## 2023-05-26 DIAGNOSIS — Z981 Arthrodesis status: Secondary | ICD-10-CM | POA: Diagnosis not present

## 2023-05-26 NOTE — Progress Notes (Signed)
Orthopedic Surgery Post-operative Office Note   Assessment: Patient is a 77 y.o. male who underwent L2-5 PSIF, L4-5 laminectomies and foraminotomies  -Date of surgery: 11/30/2022 (~6 months post-op)  He is here for follow up on his lumbar spine and has left greater trochanteric bursitis     Plan: -Operative plans complete -OTC pain medications as needed for pain relief -Recommended PT and steroid injection for his trochanteric bursitis. Injection was done today in the office -Out of bed as tolerated, no brace -No spine specific precautions -Follow up in 6 months, XRs at next visit: AP/lateral/flex/ex  Left trochanteric hip injection note: After discussing the risks, benefits, and alternatives of left trochanteric bursa injection, patient elected to proceed. The left lateral hip in the area of the trochanteric bursa was prepped with alcohol based prep. Ethyl chloride was used to anesthetize the area. A 20 gauge needle was used to inject 1cc of lidocaine, 2cc of bupivacaine, and 2cc of depomedrol into the left trochanteric bursa under standard, sterile technique. Needle was withdrawn and bandaid was applied. Patient tolerated the procedure well.    ___________________________________________________________________________     Subjective: Patient feels he has gotten improvement with surgery. He is not having any radiating leg pain. He has some left paraspinal muscle pain which is similar to the last time I saw him. He describes the pain as annoying but it has not gotten to the point that it is interfering with his ability to do his daily activities.   He also wanted to talk about left lateral hip pain. It developed without any trauma or injury. He feels it when he lays on his side. It does not radiate past the hip. He has not tried any specific treatment for it.    Objective:   General: no acute distress, appropriate affect Neurologic: alert, answering questions appropriately, following  commands Respiratory: unlabored breathing on room air Skin: incision is well healed with no evidence of infection   MSK (spine):   -Strength exam                                                   Right                Left   EHL                              5/5                  5/5 TA                                 5/5                  5/5 GSC                             5/5                  5/5 Knee extension            5/5                  5/5 Hip flexion  5/5                  5/5   -Sensory exam                           Sensation intact to light touch in L3-S1 nerve distributions of bilateral lower extremities   Left hip exam: TTP over the lateral hip in the area of the greater trochanter, no other TTP, no pain through range of motion, negative stinchfield  Imaging: X-rays of the lumbar spine from 05/25/2022 were independently reviewed and interpreted, showing posterior instrumentation from L2-L5 with no evidence of complication. No fracture or dislocation seen. Laminectomy defect from L2-5. No evidence of instability on flexion/extension views.       Patient name: Derek [name] Patient MRN: [id number] Date: 05/26/23

## 2023-05-27 ENCOUNTER — Encounter: Payer: Self-pay | Admitting: Internal Medicine

## 2023-06-20 ENCOUNTER — Ambulatory Visit (AMBULATORY_SURGERY_CENTER): Payer: Medicare Other

## 2023-06-20 VITALS — Ht 70.0 in | Wt 186.0 lb

## 2023-06-20 DIAGNOSIS — Z86010 Personal history of colonic polyps: Secondary | ICD-10-CM

## 2023-06-20 DIAGNOSIS — M48061 Spinal stenosis, lumbar region without neurogenic claudication: Secondary | ICD-10-CM | POA: Insufficient documentation

## 2023-06-20 DIAGNOSIS — Z800 Family history of malignant neoplasm of digestive organs: Secondary | ICD-10-CM

## 2023-06-20 DIAGNOSIS — Z1211 Encounter for screening for malignant neoplasm of colon: Secondary | ICD-10-CM

## 2023-06-20 DIAGNOSIS — R29898 Other symptoms and signs involving the musculoskeletal system: Secondary | ICD-10-CM | POA: Insufficient documentation

## 2023-06-20 DIAGNOSIS — R200 Anesthesia of skin: Secondary | ICD-10-CM | POA: Insufficient documentation

## 2023-06-20 NOTE — Progress Notes (Signed)
No egg or soy allergy known to patient  No issues known to pt with past sedation with any surgeries or procedures Patient denies ever being told they had issues or difficulty with intubation  No FH of Malignant Hyperthermia Pt is not on diet pills Pt is not on  home 02  Pt is not on blood thinners  Pt denies issues with constipation  Hx of A fib and  A - Flutter patient wore zio patient monitor runs of Ventricular tachycardia and A -flutter noted  Have any cardiac testing pending--no 3 month follow up on 07/06/23 LOA: independent Prep: miralax   Patient's chart reviewed by Cathlyn Parsons CNRA prior to previsit and patient appropriate for the [facility].  Previsit completed and red dot placed by patient's name on their procedure day (on provider's schedule).     PV competed with patient. Prep instructions sent via mychart and home address.

## 2023-06-21 ENCOUNTER — Encounter: Payer: Self-pay | Admitting: Internal Medicine

## 2023-06-24 ENCOUNTER — Other Ambulatory Visit: Payer: Self-pay | Admitting: Student

## 2023-07-04 DIAGNOSIS — N189 Chronic kidney disease, unspecified: Secondary | ICD-10-CM | POA: Diagnosis not present

## 2023-07-04 DIAGNOSIS — R809 Proteinuria, unspecified: Secondary | ICD-10-CM | POA: Diagnosis not present

## 2023-07-04 DIAGNOSIS — D631 Anemia in chronic kidney disease: Secondary | ICD-10-CM | POA: Diagnosis not present

## 2023-07-06 ENCOUNTER — Encounter: Payer: Self-pay | Admitting: Cardiology

## 2023-07-06 ENCOUNTER — Ambulatory Visit: Payer: Medicare Other | Attending: Cardiology | Admitting: Cardiology

## 2023-07-06 VITALS — BP 138/64 | HR 77 | Ht 70.0 in | Wt 196.0 lb

## 2023-07-06 DIAGNOSIS — I10 Essential (primary) hypertension: Secondary | ICD-10-CM | POA: Diagnosis not present

## 2023-07-06 DIAGNOSIS — I25119 Atherosclerotic heart disease of native coronary artery with unspecified angina pectoris: Secondary | ICD-10-CM

## 2023-07-06 DIAGNOSIS — I6523 Occlusion and stenosis of bilateral carotid arteries: Secondary | ICD-10-CM

## 2023-07-06 DIAGNOSIS — I484 Atypical atrial flutter: Secondary | ICD-10-CM | POA: Diagnosis not present

## 2023-07-06 DIAGNOSIS — N1832 Chronic kidney disease, stage 3b: Secondary | ICD-10-CM

## 2023-07-06 NOTE — Patient Instructions (Addendum)

## 2023-07-06 NOTE — Progress Notes (Signed)
Cardiology Office Note  Date: 07/06/2023   ID: Derek [name], Derek [name] 18-Apr-1946, MRN [id number]  History of Present Illness: Derek [name] is a 77 y.o. male last seen in April.  He is here for a follow-up visit.  States that he feels better overall.  Did have an episode of diaphoresis and weakness in the high heat and humidity, but no regular angina and stable NYHA class II dyspnea with most activities.  He has had no syncope.  I reviewed his medications which have been simplified overall.  He had recent lab work and is establishing with nephrology, creatinine 1.69.  His last LDL was 59 in April.  I reviewed his interval cardiac monitor as described below.  Physical Exam: VS:  BP 138/64 (BP Location: Right Arm)   Pulse 77   Ht 5\' 10"  (1.778 m)   Wt 196 lb (88.9 kg)   SpO2 98%   BMI 28.12 kg/m , BMI Body mass index is 28.12 kg/m.  Wt Readings from Last 3 Encounters:  07/06/23 196 lb (88.9 kg)  06/20/23 186 lb (84.4 kg)  05/17/23 194 lb (88 kg)    General: Patient appears comfortable at rest. HEENT: Conjunctiva and lids normal. Neck: Supple, no elevated JVP or carotid bruits. Lungs: Clear to auscultation, nonlabored breathing at rest. Cardiac: Regular rate and rhythm, no S3, 1/6 systolic murmur.  ECG:  An ECG dated 09/01/2022 was personally reviewed today and demonstrated:  Atypical atrial flutter with 3:1 block, nonspecific ST changes.  Labwork: 02/28/2023: BUN 23; Creatinine, Ser 1.67; Hemoglobin 11.9; Platelets 129; Potassium 4.2; Sodium 142  April 2024: Cholesterol 108, triglycerides 50, HDL 37, LDL 59 July 2024: BUN 22, creatinine 1.69, potassium 4, hemoglobin 12.8, platelets 138  Other Studies Reviewed Today:  Echocardiogram 09/01/2022:  1. Left ventricular ejection fraction, by estimation, is 50 to 55%. The  left ventricle has low normal function. The left ventricle demonstrates  regional wall motion abnormalities (see scoring diagram/findings for  description).  There is mild asymmetric  left ventricular hypertrophy of the septal segment. Left ventricular  diastolic parameters are indeterminate. The average left ventricular  global longitudinal strain is -18.2 %. The global longitudinal strain is  normal.   2. Right ventricular systolic function is normal. The right ventricular  size is normal. There is normal pulmonary artery systolic pressure. The  estimated right ventricular systolic pressure is 26.6 mmHg.   3. Left atrial size was mildly dilated.   4. The mitral valve is grossly normal. Trivial mitral valve  regurgitation.   5. The aortic valve is tricuspid. Aortic valve regurgitation is not  visualized. Aortic valve sclerosis/calcification is present, without any  evidence of aortic stenosis.   6. The inferior vena cava is normal in size with greater than 50%  respiratory variability, suggesting right atrial pressure of 3 mmHg.    Lexiscan Myoview 09/08/2022:   Findings are consistent with prior inferolateral myocardial infarction. The study is intermediate risk.   No ST deviation was noted.   LV perfusion is abnormal. There is no evidence of ischemia. There is evidence of infarction. Defect 1: There is a medium defect with severe reduction in uptake present in the apical to mid inferolateral location(s) that is fixed. There is abnormal wall motion in the defect area. Consistent with infarction.   Left ventricular function is abnormal. Global function is moderately reduced. Nuclear stress EF: 38 %. The left ventricular ejection fraction is moderately decreased (30-44%). End diastolic cavity size is mildly enlarged.  Inferolateral infarct pattern was seen on prior nuclear stress test.  Cardiac monitor May 2024: ZIO monitor reviewed.  6 days, 22 hours analyzed.   1.  Predominant rhythm is atypical atrial flutter with variable block, heart rate ranging from 35 bpm up to 84 bpm.  Overall average heart rate in the 70s. 2.  Rare PVCs were noted  representing less than 1% total beats, also rare ventricular couplets and triplets as well as limited ventricular bigeminy.  There were four episodes of NSVT, the longest of which lasted 13 beats.  No sustained ventricular arrhythmias. 3.  Multiple brief pauses were noted, the longest of which lasted 3.5 seconds.  The preponderance of these were during early morning hours. 4.  Most patient triggered episodes corresponded to rate controlled atypical atrial flutter.  There were a few in association with PVCs and with bradycardia.  Assessment and Plan:  1.  CAD status post DES to the RCA in 2007.  Follow-up Lexiscan Myoview in September 2023 was consistent with inferolateral infarct scar but no ischemia.  Echocardiogram revealed LVEF 50 to 55%.  No increasing angina at this time.  Continue aspirin, Lipitor, Toprol-XL, and as needed nitroglycerin.   2.  Intermittent palpitations and possible bradycardia at times based on home vital sign checks.  Cardiac monitor results noted above.  He did have multiple brief pauses but no high degree heart block.  Continue observation for now.   3.  Aortic atherosclerosis with 3.2 cm dimension by lumbar spine films in December 2023.  Follow-up abdominal aortic ultrasound in May did not indicate any aneurysmal dilatation however.   4.  Essential hypertension.  Systolic is in the 130s today.  Continue with present regimen including hydralazine, Cozaar, and Toprol-XL.   5.  Permanent atypical atrial flutter with CHA2DS2-VASc score of 4.  He is asymptomatic and continues on Toprol-XL.  He has declined anticoagulation over time.   6.  CKD stage IIIb, recent creatinine 1.69.  He has established with nephrology.   7.  Bilateral carotid artery disease with known occlusion of the RICA and moderate LICA stenosis.  He continues to follow with [facility].  Disposition:  Follow up  6 months.  Signed, Jonelle Sidle, M.D., F.A.C.C. [facility]

## 2023-07-07 DIAGNOSIS — N1832 Chronic kidney disease, stage 3b: Secondary | ICD-10-CM | POA: Diagnosis not present

## 2023-07-07 DIAGNOSIS — R809 Proteinuria, unspecified: Secondary | ICD-10-CM | POA: Diagnosis not present

## 2023-07-07 DIAGNOSIS — I129 Hypertensive chronic kidney disease with stage 1 through stage 4 chronic kidney disease, or unspecified chronic kidney disease: Secondary | ICD-10-CM | POA: Diagnosis not present

## 2023-07-07 DIAGNOSIS — I2510 Atherosclerotic heart disease of native coronary artery without angina pectoris: Secondary | ICD-10-CM | POA: Diagnosis not present

## 2023-07-08 ENCOUNTER — Ambulatory Visit (AMBULATORY_SURGERY_CENTER): Payer: Medicare Other | Admitting: Internal Medicine

## 2023-07-08 ENCOUNTER — Encounter: Payer: Self-pay | Admitting: Internal Medicine

## 2023-07-08 VITALS — BP 170/84 | HR 77 | Temp 98.3°F | Resp 12 | Ht 70.0 in | Wt 186.0 lb

## 2023-07-08 DIAGNOSIS — Z86010 Personal history of colonic polyps: Secondary | ICD-10-CM

## 2023-07-08 DIAGNOSIS — D122 Benign neoplasm of ascending colon: Secondary | ICD-10-CM | POA: Diagnosis not present

## 2023-07-08 DIAGNOSIS — D124 Benign neoplasm of descending colon: Secondary | ICD-10-CM | POA: Diagnosis not present

## 2023-07-08 DIAGNOSIS — K635 Polyp of colon: Secondary | ICD-10-CM | POA: Diagnosis not present

## 2023-07-08 DIAGNOSIS — Z800 Family history of malignant neoplasm of digestive organs: Secondary | ICD-10-CM

## 2023-07-08 DIAGNOSIS — Z09 Encounter for follow-up examination after completed treatment for conditions other than malignant neoplasm: Secondary | ICD-10-CM | POA: Diagnosis not present

## 2023-07-08 MED ORDER — SODIUM CHLORIDE 0.9 % IV SOLN
500.0000 mL | Freq: Once | INTRAVENOUS | Status: DC
Start: 2023-07-08 — End: 2023-07-08

## 2023-07-08 NOTE — Progress Notes (Signed)
VS completed by CW.   Pt's states no medical or surgical changes since previsit or office visit.  

## 2023-07-08 NOTE — Progress Notes (Signed)
[facility] History and Physical   Primary Care Physician:  Benita Stabile, MD   Reason for Procedure:   Hx colon polyps  Plan:    colonoscopy     HPI: Derek [name] is a 77 y.o. male here for surveillance exam Brother has CRCA also (>70)  12/2014 - adenoma, ssp and right hyperplastic polyps  03/30/2018 3 polyps removed, all diminutive cecum, ascending, sigmoid 2 ssp and hyperplastic recall 2024 Past Medical History:  Diagnosis Date   Anxiety    09/17/2022- patient denies.   Arthritis    Atypical atrial flutter (HCC)    CAD (coronary artery disease)    a. s/p DES to RCA in 2007 b. s/p NST in 10/2015 which was intermediate risk and pt preferred medical management at that time   Carotid artery disease (HCC)    Cataract    Chronic idiopathic thrombocytopenia (HCC)    Chronic renal insufficiency    Single kidney   Complication of anesthesia    Coronary atherosclerosis of native coronary vessel    DES RCA 2007   Depression    10/18/22 - patient denies.   Dysrhythmia    Essential hypertension    GERD (gastroesophageal reflux disease)    History of kidney stones    years ago   HOH (hard of hearing)    Hx of colonic polyps 01/01/2015   Mixed hyperlipidemia    Myocardial infarction [facility]) 2006-07-05   STEMI    Peripheral vascular disease (HCC)    PONV (postoperative nausea and vomiting)     Past Surgical History:  Procedure Laterality Date   CARDIAC CATHETERIZATION  2010   Patent stent and nonobsturctive cad following an abnormal cardiolite study may of 2010 with an apical lateral defect   CARPAL TUNNEL RELEASE  11/28/2012   Procedure: CARPAL TUNNEL RELEASE;  Surgeon: Kerrin Champagne, MD;  Location: [facility] OR;  Service: Orthopedics;  Laterality: Left;  Left anterior submuscular transposition of ulnar nerve at elbow, left open carpal tunnel release   COLONOSCOPY     ELBOW SURGERY Left 2013   ENDARTERECTOMY Left 04/07/2020   Procedure: LEFT CAROTID ENDARTERECTOMY;   Surgeon: Cephus Shelling, MD;  Location: [facility] OR;  Service: Vascular;  Laterality: Left;   EYE SURGERY     FEMUR FRACTURE SURGERY  1962   screws 1962   FORAMINOTOMY 2 LEVEL N/A 11/30/2022   Procedure: L3-4, L4-5 LAMINECTOMY AND FORAMINOTOMY;  Surgeon: London Sheer, MD;  Location: [facility] OR;  Service: Orthopedics;  Laterality: N/A;   FRACTURE SURGERY     HARDWARE REMOVAL  03/20/2012   Procedure: HARDWARE REMOVAL;  Surgeon: Kerrin Champagne, MD;  Location: [facility] OR;  Service: Orthopedics;  Laterality: Left;  Hardware removal 3 knowles pins left hip   HERNIA REPAIR     JOINT REPLACEMENT     LUMBAR LAMINECTOMY/DECOMPRESSION MICRODISCECTOMY N/A 10/08/2021   Procedure: Lumbar Two-Three Laminectomy/Foraminotomy, Posterolateral Arthrodesis, Non-Segmental Instrumentation Screw Fixation;  Surgeon: Coletta Memos, MD;  Location: [facility] OR;  Service: Neurosurgery;  Laterality: N/A;   PATCH ANGIOPLASTY Left 04/07/2020   Procedure: Patch Angioplasty Left Carotid;  Surgeon: Cephus Shelling, MD;  Location: [facility] OR;  Service: Vascular;  Laterality: Left;   TONSILLECTOMY     TOTAL HIP ARTHROPLASTY  03/20/2012   Procedure: TOTAL HIP ARTHROPLASTY;  Surgeon: Kerrin Champagne, MD;  Location: [facility] OR;  Service: Orthopedics;  Laterality: Left;  Left total hip replacement, ceramic on poly    Prior to Admission medications  Medication Sig Start Date End Date Taking? Authorizing Provider  allopurinol (ZYLOPRIM) 100 MG tablet Take 100 mg by mouth 2 (two) times daily.   Yes [provider]  aspirin EC 81 MG tablet Take 81 mg by mouth daily. Swallow whole.   Yes [provider]  atorvastatin (LIPITOR) 20 MG tablet Take 1 tablet by mouth once daily 03/31/23  Yes Jonelle Sidle, MD  cyanocobalamin (VITAMIN B12) 1000 MCG tablet Take 1,000 mcg by mouth daily.   Yes [provider]  finasteride (PROSCAR) 5 MG tablet Take 5 mg by mouth daily. 07/07/23  Yes [provider]  furosemide (LASIX) 80 MG  tablet Take 40 mg by mouth daily.   Yes [provider]  hydrALAZINE (APRESOLINE) 50 MG tablet Take 1 tablet (50 mg total) by mouth 3 (three) times daily. 04/11/23  Yes Jonelle Sidle, MD  losartan (COZAAR) 25 MG tablet Take 1 tablet by mouth daily. 05/18/23  Yes [provider]  metoprolol succinate (TOPROL-XL) 25 MG 24 hr tablet Take 1 tablet by mouth once daily 06/24/23  Yes Jonelle Sidle, MD  Multiple Vitamins-Minerals (PRESERVISION AREDS PO) Take 1 tablet by mouth in the morning and at bedtime.   Yes [provider]  Omega-3 Fatty Acids (FISH OIL MAXIMUM STRENGTH) 1200 MG CAPS Take 1,200 mg by mouth in the morning and at bedtime.   Yes [provider]  omeprazole (PRILOSEC) 20 MG capsule Take 20 mg by mouth daily.     Yes [provider]  acetaminophen (TYLENOL) 500 MG tablet Take 500 mg by mouth. 07/29/22   [provider]  diazepam (VALIUM) 2 MG tablet Take 2 mg by mouth 2 (two) times daily as needed.    [provider]  methocarbamol (ROBAXIN) 500 MG tablet Take 1 tablet by mouth 2 (two) times daily. 04/01/23   [provider]  nitroGLYCERIN (NITROSTAT) 0.4 MG SL tablet Place 1 tablet (0.4 mg total) under the tongue every 5 (five) minutes x 3 doses as needed for chest pain (if no relief after 3rd dose, proceed to ED or call 911). 04/19/23   Jonelle Sidle, MD    Current Outpatient Medications  Medication Sig Dispense Refill   allopurinol (ZYLOPRIM) 100 MG tablet Take 100 mg by mouth 2 (two) times daily.     aspirin EC 81 MG tablet Take 81 mg by mouth daily. Swallow whole.     atorvastatin (LIPITOR) 20 MG tablet Take 1 tablet by mouth once daily 90 tablet 0   cyanocobalamin (VITAMIN B12) 1000 MCG tablet Take 1,000 mcg by mouth daily.     finasteride (PROSCAR) 5 MG tablet Take 5 mg by mouth daily.     furosemide (LASIX) 80 MG tablet Take 40 mg by mouth daily.     hydrALAZINE (APRESOLINE) 50 MG tablet Take 1 tablet  (50 mg total) by mouth 3 (three) times daily. 270 tablet 3   losartan (COZAAR) 25 MG tablet Take 1 tablet by mouth daily.     metoprolol succinate (TOPROL-XL) 25 MG 24 hr tablet Take 1 tablet by mouth once daily 90 tablet 3   Multiple Vitamins-Minerals (PRESERVISION AREDS PO) Take 1 tablet by mouth in the morning and at bedtime.     Omega-3 Fatty Acids (FISH OIL MAXIMUM STRENGTH) 1200 MG CAPS Take 1,200 mg by mouth in the morning and at bedtime.     omeprazole (PRILOSEC) 20 MG capsule Take 20 mg by mouth daily.  acetaminophen (TYLENOL) 500 MG tablet Take 500 mg by mouth.     diazepam (VALIUM) 2 MG tablet Take 2 mg by mouth 2 (two) times daily as needed.     methocarbamol (ROBAXIN) 500 MG tablet Take 1 tablet by mouth 2 (two) times daily.     nitroGLYCERIN (NITROSTAT) 0.4 MG SL tablet Place 1 tablet (0.4 mg total) under the tongue every 5 (five) minutes x 3 doses as needed for chest pain (if no relief after 3rd dose, proceed to ED or call 911). 25 tablet 3   Current Facility-Administered Medications  Medication Dose Route Frequency Provider Last Rate Last Admin   0.9 %  sodium chloride infusion  500 mL Intravenous Once Iva Boop, MD        Allergies as of 07/08/2023 - Review Complete 07/08/2023  Allergen Reaction Noted   Ibuprofen Other (See Comments) 04/23/2014   Nsaids Other (See Comments) 09/22/2021   Prednisone Swelling 09/23/2021    Family History  Problem Relation Age of Onset   Hypertension Mother    Hypertension Father    Colon cancer Brother 6 - 41       has colostomy   Stroke Other    Coronary artery disease Other    Esophageal cancer Neg Hx    Stomach cancer Neg Hx    Rectal cancer Neg Hx    Colon polyps Neg Hx     Social History   Socioeconomic History   Marital status: Married    Spouse name: Not on file   Number of children: Not on file   Years of education: Not on file   Highest education level: Not on file  Occupational History   Not on file   Tobacco Use   Smoking status: Former    Current packs/day: 0.00    Average packs/day: 3.0 packs/day for 43.7 years (131.2 ttl pk-yrs)    Types: Cigarettes    Start date: 10/30/1960    Quit date: 07/23/2004    Years since quitting: 18.9    Passive exposure: Never   Smokeless tobacco: Never   Tobacco comments:    Counseled to remain smoke free  Vaping Use   Vaping status: Never Used  Substance and Sexual Activity   Alcohol use: No    Alcohol/week: 0.0 standard drinks of alcohol   Drug use: No   Sexual activity: Not on file  Other Topics Concern   Not on file  Social History Narrative   Patient still works.    Social Determinants of Health   Financial Resource Strain: Not on file  Food Insecurity: Not on file  Transportation Needs: Not on file  Physical Activity: Not on file  Stress: Not on file  Social Connections: Not on file  Intimate Partner Violence: Not on file    Review of Systems:  All other review of systems negative except as mentioned in the HPI.  Physical Exam: Vital signs BP (!) 157/84   Pulse 75   Temp 98.3 F (36.8 C) (Temporal)   Ht 5\' 10"  (1.778 m)   Wt 186 lb (84.4 kg)   SpO2 97%   BMI 26.69 kg/m   General:   Alert,  Well-developed, well-nourished, pleasant and cooperative in NAD Lungs:  Clear throughout to auscultation.   Heart:  Regular rate and rhythm; no murmurs, clicks, rubs,  or gallops. Abdomen:  Soft, mildly tender lower abd and nondistended. Normal bowel sounds.   Neuro/Psych:  Alert and cooperative. Normal mood and affect. A  and O x 3   @Kaelin Holford  Sena Slate, MD, [facility] [phone] (pager) 07/08/2023 1:39 PM@

## 2023-07-08 NOTE — Progress Notes (Signed)
Sedate, gd SR, tolerated procedure well, VSS, report to RN 

## 2023-07-08 NOTE — Patient Instructions (Addendum)
I found and removed 2 tiny polyps.  You also have a condition called diverticulosis - common and not usually a problem. Please read the handout provided.  I will let you know pathology results.  I appreciate the opportunity to care for you. Iva Boop, MD, [facility]  Resume all of your medications today as ordered.  Read all of the handouts given to you by your recovery room nurse.   YOU HAD AN ENDOSCOPIC PROCEDURE TODAY AT THE [facility]:   Refer to the procedure report that was given to you for any specific questions about what was found during the examination.  If the procedure report does not answer your questions, please call your gastroenterologist to clarify.  If you requested that your care partner not be given the details of your procedure findings, then the procedure report has been included in a sealed envelope for you to review at your convenience later.  YOU SHOULD EXPECT: Some feelings of bloating in the abdomen. Passage of more gas than usual.  Walking can help get rid of the air that was put into your GI tract during the procedure and reduce the bloating. If you had a lower endoscopy (such as a colonoscopy or flexible sigmoidoscopy) you may notice spotting of blood in your stool or on the toilet paper. If you underwent a bowel prep for your procedure, you may not have a normal bowel movement for a few days.  Please Note:  You might notice some irritation and congestion in your nose or some drainage.  This is from the oxygen used during your procedure.  There is no need for concern and it should clear up in a day or so.  SYMPTOMS TO REPORT IMMEDIATELY:  Following lower endoscopy (colonoscopy or flexible sigmoidoscopy):  Excessive amounts of blood in the stool  Significant tenderness or worsening of abdominal pains  Swelling of the abdomen that is new, acute  Fever of 100F or higher   For urgent or emergent issues, a gastroenterologist can be reached at any  hour by calling [phone]. Do not use MyChart messaging for urgent concerns.    DIET:  We do recommend a small meal at first, but then you may proceed to your regular diet.  Drink plenty of fluids but you should avoid alcoholic beverages for 24 hours.  ACTIVITY:  You should plan to take it easy for the rest of today and you should NOT DRIVE or use heavy machinery until tomorrow (because of the sedation medicines used during the test).    FOLLOW UP: Our staff will call the number listed on your records the next business day following your procedure.  We will call around 7:15- 8:00 am to check on you and address any questions or concerns that you may have regarding the information given to you following your procedure. If we do not reach you, we will leave a message.     If any biopsies were taken you will be contacted by phone or by letter within the next 1-3 weeks.  Please call us at [phone] if you have not heard about the biopsies in 3 weeks.    SIGNATURES/CONFIDENTIALITY: You and/or your care partner have signed paperwork which will be entered into your electronic medical record.  These signatures attest to the fact that that the information above on your After Visit Summary has been reviewed and is understood.  Full responsibility of the confidentiality of this discharge information lies with you and/or your care-partner.

## 2023-07-08 NOTE — Progress Notes (Signed)
Patient states that he told anesthesia about chest pain.  CRNA put him on his back, and the pain went away.  No pain in recovery noted.

## 2023-07-08 NOTE — Op Note (Signed)
[facility] Patient Name: Derek [name] Procedure Date: 07/08/2023 1:38 PM MRN: [id number] Endoscopist: Iva Boop , MD, [id number] Age: 77 Referring MD:  Date of Birth: March 02, 1946 Gender: Male Account #: [id number] Procedure:                Colonoscopy Indications:              Surveillance: Personal history of adenomatous                            polyps on last colonoscopy 5 years ago, Last                            colonoscopy: April 2019 Medicines:                Monitored Anesthesia Care Procedure:                Pre-Anesthesia Assessment:                           - Prior to the procedure, a History and Physical                            was performed, and patient medications and                            allergies were reviewed. The patient's tolerance of                            previous anesthesia was also reviewed. The risks                            and benefits of the procedure and the sedation                            options and risks were discussed with the patient.                            All questions were answered, and informed consent                            was obtained. Prior Anticoagulants: The patient has                            taken no anticoagulant or antiplatelet agents. ASA                            Grade Assessment: III - A patient with severe                            systemic disease. After reviewing the risks and                            benefits, the patient was deemed in satisfactory  condition to undergo the procedure.                           After obtaining informed consent, the colonoscope                            was passed under direct vision. Throughout the                            procedure, the patient's blood pressure, pulse, and                            oxygen saturations were monitored continuously. The                            CF [id number] #[id number] was introduced through  the anus                            and advanced to the the ileocecal valve. The                            colonoscopy was performed without difficulty. The                            patient tolerated the procedure well. The quality                            of the bowel preparation was adequate. The                            ileocecal valve, appendiceal orifice, and rectum                            were photographed. The bowel preparation used was                            Miralax via split dose instruction. Scope In: 1:50:22 PM Scope Out: 2:06:15 PM Scope Withdrawal Time: 0 hours 10 minutes 12 seconds  Total Procedure Duration: 0 hours 15 minutes 53 seconds  Findings:                 The perianal and digital rectal examinations were                            normal.                           Two sessile polyps were found in the descending                            colon and ascending colon. The polyps were                            diminutive in size. These polyps were removed with  a cold snare. Resection and retrieval were                            complete. Verification of patient identification                            for the specimen was done. Estimated blood loss was                            minimal.                           Multiple diverticula were found in the left colon.                           The exam was otherwise without abnormality on                            direct and retroflexion views. Complications:            No immediate complications. Estimated Blood Loss:     Estimated blood loss was minimal. Impression:               - Two diminutive polyps in the descending colon and                            in the ascending colon, removed with a cold snare.                            Resected and retrieved.                           - Diverticulosis in the left colon.                           - The examination was otherwise  normal on direct                            and retroflexion views.                           - Personal history of colonic polyps.                           12/2014 - adenoma, ssp and right hyperplastic polyps                           03/30/2018 3 polyps removed, all diminutive cecum,                            ascending, sigmoid 2 ssp and hyperplastic Recommendation:           - Patient has a contact number available for                            emergencies. The signs and symptoms of potential  delayed complications were discussed with the                            patient. Return to normal activities tomorrow.                            Written discharge instructions were provided to the                            patient.                           - Resume previous diet.                           - Continue present medications.                           - Await pathology results.                           - No repeat colonoscopy due to age. Iva Boop, MD 07/08/2023 2:13:20 PM This report has been signed electronically.

## 2023-07-18 ENCOUNTER — Encounter: Payer: Self-pay | Admitting: Internal Medicine

## 2023-07-19 ENCOUNTER — Other Ambulatory Visit (HOSPITAL_COMMUNITY): Payer: Self-pay | Admitting: Nephrology

## 2023-07-19 DIAGNOSIS — N1832 Chronic kidney disease, stage 3b: Secondary | ICD-10-CM

## 2023-07-19 DIAGNOSIS — I484 Atypical atrial flutter: Secondary | ICD-10-CM

## 2023-07-19 DIAGNOSIS — I129 Hypertensive chronic kidney disease with stage 1 through stage 4 chronic kidney disease, or unspecified chronic kidney disease: Secondary | ICD-10-CM

## 2023-07-19 DIAGNOSIS — R809 Proteinuria, unspecified: Secondary | ICD-10-CM

## 2023-07-20 ENCOUNTER — Other Ambulatory Visit (HOSPITAL_COMMUNITY): Payer: Self-pay | Admitting: Nephrology

## 2023-07-20 DIAGNOSIS — D638 Anemia in other chronic diseases classified elsewhere: Secondary | ICD-10-CM

## 2023-07-20 DIAGNOSIS — R809 Proteinuria, unspecified: Secondary | ICD-10-CM

## 2023-07-20 DIAGNOSIS — I129 Hypertensive chronic kidney disease with stage 1 through stage 4 chronic kidney disease, or unspecified chronic kidney disease: Secondary | ICD-10-CM

## 2023-07-20 DIAGNOSIS — N1832 Chronic kidney disease, stage 3b: Secondary | ICD-10-CM

## 2023-07-28 ENCOUNTER — Ambulatory Visit (HOSPITAL_COMMUNITY)
Admission: RE | Admit: 2023-07-28 | Discharge: 2023-07-28 | Disposition: A | Discharge status: Home / Self Care | Payer: Medicare Other | Source: Ambulatory Visit | Attending: Nephrology | Admitting: Nephrology

## 2023-07-28 DIAGNOSIS — R809 Proteinuria, unspecified: Secondary | ICD-10-CM | POA: Diagnosis not present

## 2023-07-28 DIAGNOSIS — I129 Hypertensive chronic kidney disease with stage 1 through stage 4 chronic kidney disease, or unspecified chronic kidney disease: Secondary | ICD-10-CM | POA: Diagnosis not present

## 2023-07-28 DIAGNOSIS — N1832 Chronic kidney disease, stage 3b: Secondary | ICD-10-CM | POA: Diagnosis not present

## 2023-07-28 DIAGNOSIS — N261 Atrophy of kidney (terminal): Secondary | ICD-10-CM | POA: Diagnosis not present

## 2023-07-28 DIAGNOSIS — D638 Anemia in other chronic diseases classified elsewhere: Secondary | ICD-10-CM | POA: Diagnosis not present

## 2023-08-01 DIAGNOSIS — N189 Chronic kidney disease, unspecified: Secondary | ICD-10-CM | POA: Diagnosis not present

## 2023-08-01 DIAGNOSIS — I2510 Atherosclerotic heart disease of native coronary artery without angina pectoris: Secondary | ICD-10-CM | POA: Diagnosis not present

## 2023-08-01 DIAGNOSIS — N1832 Chronic kidney disease, stage 3b: Secondary | ICD-10-CM | POA: Diagnosis not present

## 2023-08-01 DIAGNOSIS — R809 Proteinuria, unspecified: Secondary | ICD-10-CM | POA: Diagnosis not present

## 2023-08-01 DIAGNOSIS — I129 Hypertensive chronic kidney disease with stage 1 through stage 4 chronic kidney disease, or unspecified chronic kidney disease: Secondary | ICD-10-CM | POA: Diagnosis not present

## 2023-08-03 ENCOUNTER — Other Ambulatory Visit: Payer: Self-pay | Admitting: *Deleted

## 2023-08-04 DIAGNOSIS — N1832 Chronic kidney disease, stage 3b: Secondary | ICD-10-CM | POA: Diagnosis not present

## 2023-08-04 DIAGNOSIS — R809 Proteinuria, unspecified: Secondary | ICD-10-CM | POA: Diagnosis not present

## 2023-08-04 DIAGNOSIS — D696 Thrombocytopenia, unspecified: Secondary | ICD-10-CM | POA: Diagnosis not present

## 2023-08-04 DIAGNOSIS — I129 Hypertensive chronic kidney disease with stage 1 through stage 4 chronic kidney disease, or unspecified chronic kidney disease: Secondary | ICD-10-CM | POA: Diagnosis not present

## 2023-08-24 ENCOUNTER — Ambulatory Visit: Payer: Medicare Other | Admitting: Urology

## 2023-08-24 ENCOUNTER — Encounter: Payer: Self-pay | Admitting: Urology

## 2023-08-24 VITALS — BP 147/71 | HR 81

## 2023-08-24 DIAGNOSIS — N2889 Other specified disorders of kidney and ureter: Secondary | ICD-10-CM | POA: Diagnosis not present

## 2023-08-24 DIAGNOSIS — Z125 Encounter for screening for malignant neoplasm of prostate: Secondary | ICD-10-CM | POA: Diagnosis not present

## 2023-08-24 LAB — URINALYSIS, ROUTINE W REFLEX MICROSCOPIC
Bilirubin, UA: NEGATIVE
Glucose, UA: NEGATIVE
Ketones, UA: NEGATIVE
Nitrite, UA: NEGATIVE
RBC, UA: NEGATIVE
Specific Gravity, UA: 1.01 (ref 1.005–1.030)
Urobilinogen, Ur: 1 mg/dL (ref 0.2–1.0)
pH, UA: 6.5 (ref 5.0–7.5)

## 2023-08-24 LAB — MICROSCOPIC EXAMINATION
Bacteria, UA: NONE SEEN
RBC, Urine: NONE SEEN /HPF (ref 0–2)

## 2023-08-24 NOTE — Patient Instructions (Signed)
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
## 2023-08-25 ENCOUNTER — Inpatient Hospital Stay: Payer: Medicare Other | Attending: Oncology | Admitting: Oncology

## 2023-08-25 ENCOUNTER — Encounter: Payer: Self-pay | Admitting: Oncology

## 2023-08-25 VITALS — BP 158/84 | HR 69 | Temp 97.5°F | Resp 17 | Ht 70.0 in | Wt 202.1 lb

## 2023-08-25 DIAGNOSIS — D472 Monoclonal gammopathy: Secondary | ICD-10-CM | POA: Insufficient documentation

## 2023-08-25 DIAGNOSIS — N1832 Chronic kidney disease, stage 3b: Secondary | ICD-10-CM | POA: Insufficient documentation

## 2023-08-25 DIAGNOSIS — Z87891 Personal history of nicotine dependence: Secondary | ICD-10-CM | POA: Diagnosis not present

## 2023-08-25 DIAGNOSIS — Z886 Allergy status to analgesic agent status: Secondary | ICD-10-CM | POA: Diagnosis not present

## 2023-08-25 DIAGNOSIS — R972 Elevated prostate specific antigen [PSA]: Secondary | ICD-10-CM | POA: Insufficient documentation

## 2023-08-25 DIAGNOSIS — Z888 Allergy status to other drugs, medicaments and biological substances status: Secondary | ICD-10-CM | POA: Insufficient documentation

## 2023-08-25 LAB — PSA: Prostate Specific Ag, Serum: 2.4 ng/mL (ref 0.0–4.0)

## 2023-08-25 NOTE — Assessment & Plan Note (Signed)
Patient has stage IIIb chronic kidney disease Being managed by Dr. Wolfgang Phoenix

## 2023-08-25 NOTE — Assessment & Plan Note (Signed)
PSA from 08/24/2023 24-2.4 PSA from 2021: 2.5 Stable levels for 3 years now. Continue to monitor, urology monitoring

## 2023-08-25 NOTE — Patient Instructions (Addendum)
Please get a PET scan done before next visit

## 2023-08-25 NOTE — Assessment & Plan Note (Signed)
-  Patient has a recent diagnosis of MGUS his nephrologist -M spike of 0.8, IFE consistent with IgG lambda monoclonal protein -Will obtain a PET scan to rule out lytic bone lesions hands multiple myeloma -Normal hemoglobin, creatinine slightly elevated and is at baseline for the patient, normal calcium normal -Discussed the definition of MGUS and that it is a premultiple myeloma condition with elevated M protein.  Discussed in detail the PET scan would help [place] rule out multiple myeloma and the need for bone marrow biopsy.  Return to clinic PET scan to discuss the results and further steps

## 2023-08-25 NOTE — Progress Notes (Signed)
[facility] HEMATOLOGY NEW VISIT  Benita Stabile, MD  REASON FOR REFERRAL: MGUS   HISTORY OF PRESENT ILLNESS: Derek [name] 77 y.o. male referred by his nephrologist for MGUS.  Patient has a past medical history of hypertension, CAD, peripheral vascular disease, A-fib not on anticoagulation, CKD.  He presented unaccompanied to the clinic today.  Patient is at baseline of health.  Denies any complaints today.  Denies weight loss, fatigue, night sweats, fever, chills, bone pain, abdominal pain.  He saw some blood in urine a few weeks ago and is followed up by urology.  He is overall of good health.  He was referred by his nephrologist for a recent diagnosis of MGUS.  Patient was a former smoker of 50 years, quit in 2005.  No alcohol use.  Lives with his wife in [place] and is functional by himself.  I have reviewed the past medical history, past surgical history, social history and family history with the patient   ALLERGIES:  is allergic to ibuprofen, nsaids, and prednisone.  MEDICATIONS:  Current Outpatient Medications  Medication Sig Dispense Refill   acetaminophen (TYLENOL) 500 MG tablet Take 500 mg by mouth.     allopurinol (ZYLOPRIM) 100 MG tablet Take 100 mg by mouth 2 (two) times daily.     aspirin EC 81 MG tablet Take 81 mg by mouth daily. Swallow whole.     atorvastatin (LIPITOR) 20 MG tablet Take 1 tablet by mouth once daily 90 tablet 0   cyanocobalamin (VITAMIN B12) 1000 MCG tablet Take 1,000 mcg by mouth daily.     diazepam (VALIUM) 2 MG tablet Take 2 mg by mouth 2 (two) times daily as needed.     finasteride (PROSCAR) 5 MG tablet Take 5 mg by mouth daily.     furosemide (LASIX) 80 MG tablet Take 40 mg by mouth daily.     hydrALAZINE (APRESOLINE) 50 MG tablet Take 1 tablet (50 mg total) by mouth 3 (three) times daily. 270 tablet 3   losartan (COZAAR) 25 MG tablet Take 1 tablet by mouth daily.     methocarbamol (ROBAXIN) 500 MG  tablet Take 1 tablet by mouth 2 (two) times daily.     metoprolol succinate (TOPROL-XL) 25 MG 24 hr tablet Take 1 tablet by mouth once daily 90 tablet 3   Multiple Vitamins-Minerals (PRESERVISION AREDS PO) Take 1 tablet by mouth in the morning and at bedtime.     nitroGLYCERIN (NITROSTAT) 0.4 MG SL tablet Place 1 tablet (0.4 mg total) under the tongue every 5 (five) minutes x 3 doses as needed for chest pain (if no relief after 3rd dose, proceed to ED or call 911). 25 tablet 3   Omega-3 Fatty Acids (FISH OIL MAXIMUM STRENGTH) 1200 MG CAPS Take 1,200 mg by mouth in the morning and at bedtime.     omeprazole (PRILOSEC) 20 MG capsule Take 20 mg by mouth daily.       No current facility-administered medications for this visit.     REVIEW OF SYSTEMS:   Constitutional: Denies fevers, chills or night sweats Eyes: Denies blurriness of vision Ears, nose, mouth, throat, and face: Denies mucositis or sore throat Respiratory: Denies cough, dyspnea or wheezes Cardiovascular: Denies palpitation, chest discomfort or lower extremity swelling Gastrointestinal:  Denies nausea, heartburn or change in bowel habits Skin: Denies abnormal skin rashes Lymphatics: Denies new lymphadenopathy or easy bruising Neurological:Denies numbness, tingling or new weaknesses Behavioral/Psych: Mood is stable, no new  changes  All other systems were reviewed with the patient and are negative.  PHYSICAL EXAMINATION:   Vitals:   08/25/23 0905  BP: (!) 156/88  Pulse: 69  Resp: 17  Temp: (!) 97.5 F (36.4 C)  SpO2: 97%    GENERAL:alert, no distress and comfortable LUNGS: clear to auscultation and percussion with normal breathing effort HEART: regular rate & rhythm and no murmurs and no lower extremity edema ABDOMEN:abdomen soft, non-tender and normal bowel sounds Musculoskeletal:no cyanosis of digits and no clubbing  NEURO: alert & oriented x 3 with fluent speech  LABORATORY DATA:  I have reviewed the data as listed  below from [facility] 08/08/2023 CBC: WBC: 5.6, hemoglobin: 13.2, MCV: 85, platelets: 125 SPEP: M spike: 0.8, IFE: IgG monoclonal protein with lambda light chain specificity IgG: 1942, IgA: 314, IgM: CMP: Creatinine: 1.2, calcium: 9 FLC: FK LC: 41.1, FL LC: 56.2, FLC ratio: 0.73 Urinalysis: Total 24-hour protein: 143, M spike of 5.8: IFE consistent with IgG monoclonal protein with lambda light chain specificity Iron labs, vitamin B12 and folate: WNL   ASSESSMENT & PLAN:  Patient is a 77 year old male with multiple comorbidities referred for newly diagnosed MGUS.  MGUS (monoclonal gammopathy of unknown significance) -Patient has a recent diagnosis of MGUS his nephrologist -M spike of 0.8, IFE consistent with IgG lambda monoclonal protein -Will obtain a PET scan to rule out lytic bone lesions hands multiple myeloma -Normal hemoglobin, creatinine slightly elevated and is at baseline for the patient, normal calcium normal -Discussed the definition of MGUS and that it is a premultiple myeloma condition with elevated M protein.  Discussed in detail the PET scan would help [place] rule out multiple myeloma and the need for bone marrow biopsy.  Return to clinic PET scan to discuss the results and further steps  Stage 3 chronic kidney disease [facility]) Patient has stage IIIb chronic kidney disease Being managed by Dr. Wolfgang Phoenix  Elevated PSA PSA from 08/24/2023 24-2.4 PSA from 2021: 2.5 Stable levels for 3 years now. Continue to monitor, urology monitoring   Orders Placed This Encounter  Procedures   NM PET Image Restag (PS) Skull Base To Thigh    Standing Status:   Future    Standing Expiration Date:   08/24/2024    Order Specific Question:   If indicated for the ordered procedure, I authorize the administration of a radiopharmaceutical per Radiology protocol    Answer:   Yes    Order Specific Question:   Preferred imaging location?    Answer:   [facility]    The total time spent in the  appointment was 50 minutes encounter with patients including review of chart and various tests results, discussions about plan of care and coordination of care plan   All questions were answered. The patient knows to call the clinic with any problems, questions or concerns. No barriers to learning was detected.   Cindie Crumbly, MD 9/12/202410:17 AM

## 2023-09-06 ENCOUNTER — Other Ambulatory Visit: Payer: Self-pay | Admitting: Cardiology

## 2023-09-07 DIAGNOSIS — I499 Cardiac arrhythmia, unspecified: Secondary | ICD-10-CM | POA: Diagnosis not present

## 2023-09-07 DIAGNOSIS — I469 Cardiac arrest, cause unspecified: Secondary | ICD-10-CM | POA: Diagnosis not present

## 2023-09-07 DIAGNOSIS — Z743 Need for continuous supervision: Secondary | ICD-10-CM | POA: Diagnosis not present

## 2023-09-07 DIAGNOSIS — R404 Transient alteration of awareness: Secondary | ICD-10-CM | POA: Diagnosis not present

## 2023-09-07 DIAGNOSIS — R0689 Other abnormalities of breathing: Secondary | ICD-10-CM | POA: Diagnosis not present

## 2023-09-13 DEATH — deceased

## 2023-09-22 ENCOUNTER — Other Ambulatory Visit (HOSPITAL_COMMUNITY): Payer: Medicare Other

## 2023-09-30 ENCOUNTER — Inpatient Hospital Stay: Payer: Medicare Other | Admitting: Oncology

## 2023-11-22 ENCOUNTER — Ambulatory Visit: Payer: Medicare Other | Admitting: Vascular Surgery

## 2023-11-22 ENCOUNTER — Encounter (HOSPITAL_COMMUNITY): Payer: Medicare Other

## 2023-11-24 ENCOUNTER — Ambulatory Visit: Payer: Medicare Other | Admitting: Orthopedic Surgery

## 2024-02-22 ENCOUNTER — Ambulatory Visit: Payer: Medicare Other | Admitting: Urology
